# Patient Record
Sex: Female | Born: 1935 | Race: White | Hispanic: No | Marital: Single | Smoking: Former smoker
Health system: Southern US, Community
[De-identification: ages and names within clinical notes are randomized; demographics above are authoritative.]

## PROBLEM LIST (undated history)

## (undated) DIAGNOSIS — I714 Abdominal aortic aneurysm, without rupture: Secondary | ICD-10-CM

## (undated) DIAGNOSIS — C4491 Basal cell carcinoma of skin, unspecified: Secondary | ICD-10-CM

## (undated) DIAGNOSIS — D649 Anemia, unspecified: Secondary | ICD-10-CM

## (undated) DIAGNOSIS — M1990 Unspecified osteoarthritis, unspecified site: Secondary | ICD-10-CM

## (undated) DIAGNOSIS — I7090 Unspecified atherosclerosis: Secondary | ICD-10-CM

## (undated) DIAGNOSIS — J189 Pneumonia, unspecified organism: Secondary | ICD-10-CM

## (undated) DIAGNOSIS — N1330 Unspecified hydronephrosis: Secondary | ICD-10-CM

## (undated) DIAGNOSIS — Z5189 Encounter for other specified aftercare: Secondary | ICD-10-CM

## (undated) DIAGNOSIS — R1310 Dysphagia, unspecified: Secondary | ICD-10-CM

## (undated) DIAGNOSIS — K219 Gastro-esophageal reflux disease without esophagitis: Secondary | ICD-10-CM

## (undated) DIAGNOSIS — N390 Urinary tract infection, site not specified: Secondary | ICD-10-CM

## (undated) DIAGNOSIS — I739 Peripheral vascular disease, unspecified: Secondary | ICD-10-CM

## (undated) DIAGNOSIS — IMO0002 Reserved for concepts with insufficient information to code with codable children: Secondary | ICD-10-CM

## (undated) DIAGNOSIS — I10 Essential (primary) hypertension: Secondary | ICD-10-CM

## (undated) DIAGNOSIS — E7800 Pure hypercholesterolemia, unspecified: Secondary | ICD-10-CM

## (undated) HISTORY — DX: Reserved for concepts with insufficient information to code with codable children: IMO0002

## (undated) HISTORY — PX: ABDOMINAL HYSTERECTOMY: SHX81

## (undated) HISTORY — DX: Gastro-esophageal reflux disease without esophagitis: K21.9

## (undated) HISTORY — DX: Peripheral vascular disease, unspecified: I73.9

## (undated) HISTORY — PX: BLADDER SURGERY: SHX569

## (undated) HISTORY — PX: CHOLECYSTECTOMY: SHX55

## (undated) HISTORY — PX: ARTERY REPAIR: SHX559

## (undated) HISTORY — DX: Unspecified atherosclerosis: I70.90

## (undated) HISTORY — DX: Anemia, unspecified: D64.9

## (undated) HISTORY — DX: Pure hypercholesterolemia, unspecified: E78.00

## (undated) HISTORY — PX: CAROTID ENDARTERECTOMY: SUR193

## (undated) HISTORY — DX: Basal cell carcinoma of skin, unspecified: C44.91

## (undated) HISTORY — DX: Encounter for other specified aftercare: Z51.89

---

## 2003-02-22 ENCOUNTER — Encounter: Admission: RE | Admit: 2003-02-22 | Discharge: 2003-02-22 | Payer: Self-pay | Admitting: Obstetrics and Gynecology

## 2003-02-22 ENCOUNTER — Encounter: Payer: Self-pay | Admitting: Obstetrics and Gynecology

## 2006-11-24 ENCOUNTER — Inpatient Hospital Stay (HOSPITAL_COMMUNITY): Admission: RE | Admit: 2006-11-24 | Discharge: 2006-11-26 | Payer: Self-pay | Admitting: Cardiology

## 2006-12-02 ENCOUNTER — Ambulatory Visit: Payer: Self-pay | Admitting: Vascular Surgery

## 2007-01-25 ENCOUNTER — Ambulatory Visit: Payer: Self-pay | Admitting: Vascular Surgery

## 2007-02-16 ENCOUNTER — Ambulatory Visit: Payer: Self-pay | Admitting: Vascular Surgery

## 2007-02-16 ENCOUNTER — Encounter: Payer: Self-pay | Admitting: Vascular Surgery

## 2007-02-17 ENCOUNTER — Inpatient Hospital Stay (HOSPITAL_COMMUNITY): Admission: AD | Admit: 2007-02-17 | Discharge: 2007-02-18 | Payer: Self-pay | Admitting: Vascular Surgery

## 2007-03-01 ENCOUNTER — Ambulatory Visit: Payer: Self-pay | Admitting: Vascular Surgery

## 2007-03-30 ENCOUNTER — Inpatient Hospital Stay (HOSPITAL_COMMUNITY): Admission: RE | Admit: 2007-03-30 | Discharge: 2007-04-05 | Payer: Self-pay | Admitting: Vascular Surgery

## 2007-03-30 ENCOUNTER — Encounter: Payer: Self-pay | Admitting: Vascular Surgery

## 2007-03-30 ENCOUNTER — Ambulatory Visit: Payer: Self-pay | Admitting: Vascular Surgery

## 2007-04-12 ENCOUNTER — Ambulatory Visit: Payer: Self-pay | Admitting: Vascular Surgery

## 2010-07-20 LAB — CBC
HCT: 18.8 % — ABNORMAL LOW (ref 36.0–46.0)
MCHC: 26.6 g/dL — ABNORMAL LOW (ref 30.0–36.0)
MCV: 61.8 fL — ABNORMAL LOW (ref 78.0–100.0)
Platelets: 219 10*3/uL (ref 150–400)
RDW: 20.8 % — ABNORMAL HIGH (ref 11.5–15.5)
WBC: 8.1 10*3/uL (ref 4.0–10.5)

## 2010-07-20 LAB — DIFFERENTIAL
Basophils Absolute: 0 10*3/uL (ref 0.0–0.1)
Lymphs Abs: 1.4 10*3/uL (ref 0.7–4.0)
Monocytes Absolute: 0.7 10*3/uL (ref 0.1–1.0)
Monocytes Relative: 9 % (ref 3–12)
Neutro Abs: 5.8 10*3/uL (ref 1.7–7.7)

## 2010-07-20 LAB — BASIC METABOLIC PANEL
BUN: 14 mg/dL (ref 6–23)
Calcium: 8.5 mg/dL (ref 8.4–10.5)
GFR calc non Af Amer: >60 mL/min (ref >60)
Glucose, Bld: 117 mg/dL — ABNORMAL HIGH (ref 70–99)
Potassium: 4 mEq/L (ref 3.5–5.1)
Sodium: 136 mEq/L (ref 135–145)

## 2010-07-20 LAB — CK TOTAL AND CKMB (NOT AT ARMC): Relative Index: INVALID (ref 0.0–2.5)

## 2010-07-21 ENCOUNTER — Inpatient Hospital Stay (HOSPITAL_COMMUNITY)
Admission: EM | Admit: 2010-07-21 | Discharge: 2010-07-24 | DRG: 086 | Disposition: A | Discharge status: Home / Self Care | Payer: MEDICARE | Attending: Emergency Medicine | Admitting: Emergency Medicine

## 2010-07-21 DIAGNOSIS — K922 Gastrointestinal hemorrhage, unspecified: Secondary | ICD-10-CM | POA: Diagnosis present

## 2010-07-21 DIAGNOSIS — Y92009 Unspecified place in unspecified non-institutional (private) residence as the place of occurrence of the external cause: Secondary | ICD-10-CM

## 2010-07-21 DIAGNOSIS — I739 Peripheral vascular disease, unspecified: Secondary | ICD-10-CM | POA: Diagnosis present

## 2010-07-21 DIAGNOSIS — Z882 Allergy status to sulfonamides status: Secondary | ICD-10-CM

## 2010-07-21 DIAGNOSIS — K5730 Diverticulosis of large intestine without perforation or abscess without bleeding: Secondary | ICD-10-CM | POA: Diagnosis present

## 2010-07-21 DIAGNOSIS — I10 Essential (primary) hypertension: Secondary | ICD-10-CM | POA: Diagnosis present

## 2010-07-21 DIAGNOSIS — D126 Benign neoplasm of colon, unspecified: Secondary | ICD-10-CM | POA: Diagnosis present

## 2010-07-21 DIAGNOSIS — S065X0A Traumatic subdural hemorrhage without loss of consciousness, initial encounter: Principal | ICD-10-CM | POA: Diagnosis present

## 2010-07-21 DIAGNOSIS — K2970 Gastritis, unspecified, without bleeding: Secondary | ICD-10-CM | POA: Diagnosis present

## 2010-07-21 DIAGNOSIS — D509 Iron deficiency anemia, unspecified: Secondary | ICD-10-CM

## 2010-07-21 DIAGNOSIS — R55 Syncope and collapse: Secondary | ICD-10-CM | POA: Diagnosis present

## 2010-07-21 DIAGNOSIS — Z880 Allergy status to penicillin: Secondary | ICD-10-CM

## 2010-07-21 DIAGNOSIS — D500 Iron deficiency anemia secondary to blood loss (chronic): Secondary | ICD-10-CM | POA: Diagnosis present

## 2010-07-21 DIAGNOSIS — R630 Anorexia: Secondary | ICD-10-CM | POA: Diagnosis present

## 2010-07-21 DIAGNOSIS — Z7982 Long term (current) use of aspirin: Secondary | ICD-10-CM

## 2010-07-21 DIAGNOSIS — K259 Gastric ulcer, unspecified as acute or chronic, without hemorrhage or perforation: Secondary | ICD-10-CM | POA: Diagnosis present

## 2010-07-21 DIAGNOSIS — W19XXXA Unspecified fall, initial encounter: Secondary | ICD-10-CM | POA: Diagnosis present

## 2010-07-21 DIAGNOSIS — K2990 Gastroduodenitis, unspecified, without bleeding: Secondary | ICD-10-CM | POA: Diagnosis present

## 2010-07-21 DIAGNOSIS — E119 Type 2 diabetes mellitus without complications: Secondary | ICD-10-CM | POA: Diagnosis present

## 2010-07-21 LAB — IRON AND TIBC: UIBC: 365 ug/dL

## 2010-07-21 LAB — COMPREHENSIVE METABOLIC PANEL
ALT: 12 U/L (ref 0–35)
BUN: 11 mg/dL (ref 6–23)
Calcium: 8.5 mg/dL (ref 8.4–10.5)
Glucose, Bld: 99 mg/dL (ref 70–99)
Sodium: 142 mEq/L (ref 135–145)
Total Protein: 5.7 g/dL — ABNORMAL LOW (ref 6.0–8.3)

## 2010-07-21 LAB — CARDIAC PANEL(CRET KIN+CKTOT+MB+TROPI)
CK, MB: 1.8 ng/mL (ref 0.3–4.0)
CK, MB: 4 ng/mL (ref 0.3–4.0)
Relative Index: INVALID (ref 0.0–2.5)
Total CK: 45 U/L (ref 7–177)
Total CK: 92 U/L (ref 7–177)
Troponin I: 0.02 ng/mL (ref 0.00–0.06)
Troponin I: 0.14 ng/mL — ABNORMAL HIGH (ref 0.00–0.06)

## 2010-07-21 LAB — FERRITIN: Ferritin: 12 ng/mL (ref 10–291)

## 2010-07-21 LAB — MAGNESIUM: Magnesium: 1.2 mg/dL — ABNORMAL LOW (ref 1.5–2.5)

## 2010-07-21 LAB — CBC
HCT: 24.2 % — ABNORMAL LOW (ref 36.0–46.0)
Hemoglobin: 6.9 g/dL — CL (ref 12.0–15.0)
RBC: 3.64 MIL/uL — ABNORMAL LOW (ref 3.87–5.11)
RDW: 24.2 % — ABNORMAL HIGH (ref 11.5–15.5)
WBC: 5.7 10*3/uL (ref 4.0–10.5)

## 2010-07-21 LAB — TSH: TSH: 1.832 u[IU]/mL (ref 0.350–4.500)

## 2010-07-21 LAB — FOLATE: Folate: >20 ng/mL

## 2010-07-21 LAB — APTT: aPTT: 30 seconds (ref 24–37)

## 2010-07-21 LAB — DIFFERENTIAL
Basophils Absolute: 0 10*3/uL (ref 0.0–0.1)
Lymphocytes Relative: 17 % (ref 12–46)
Lymphs Abs: 1 10*3/uL (ref 0.7–4.0)
Monocytes Relative: 12 % (ref 3–12)
Neutrophils Relative %: 68 % (ref 43–77)

## 2010-07-21 LAB — GLUCOSE, CAPILLARY

## 2010-07-21 LAB — PREPARE RBC (CROSSMATCH)

## 2010-07-21 LAB — MRSA PCR SCREENING: MRSA by PCR: NEGATIVE

## 2010-07-21 NOTE — H&P (Signed)
NAME:  Ariana Shea, [name]:  [id number]  MEDICAL RECORD NO.:  [id number]          PATIENT TYPE:  EMS  LOCATION:  MAJO                         FACILITY:  [facility]  PHYSICIAN:  Talmage Nap, MD  DATE OF BIRTH:  March 29, 1934  DATE OF ADMISSION:  07/20/2010 DATE OF DISCHARGE:                             HISTORY & PHYSICAL   PRIMARY CARE PHYSICIAN:  Dr. Delorse Lek, [facility].  History obtainable from the patient.  CHIEF COMPLAINT:  Fall and headache, 4 days' duration.  HISTORY OF PRESENT ILLNESS:  The patient is a 77 year old Caucasian female with history of bilateral carotid endarterectomy, diabetes mellitus, and hypertension; presenting to the emergency room with headaches and history of fall about 4 days ago, on the advice of her primary care physician.  The patient claimed that 4 days ago, while at home, he suddenly blacked out and fell backwards and hit the back of the head on the floor.  She could not recall the event that occurred prior to falling backwards.  She could not recall whether there was any premonitory symptoms prior to falling.  Post fall, patient observed that she was on the floor for unspecified duration of time and subsequently was awake, alert, and oriented.  She however experienced persistent occipital headache.  She denied any history of chest pain.  She denied any history of shortness of breath.  She denied any PND or orthopnea. She denied any hematochezia or melena.  Today, however, the patient decided to call her primary care physician to report the incident of fall about 4 days ago, and she was asked to come to the emergency room by her primary care physician to be evaluated.  At the time patient was seen by me, her only complaint was headache.  She denied any history of neck stiffness.  The headache was said to be occipital.  She denied any blurry vision, no nausea or vomiting, no fever, no chills, no rigor.  No chest  pain, no shortness of breath.  On presentation, hematologic indices done was found to be 5.0 g; she was subsequently admitted for evaluation of anemia.  She also had a CT of the head done without contrast, which showed a small acute subdural hemorrhage not more than 6 mm in thickness, with no significant mass effect.  I discussed extensively the case with the ED physician, Dr. Ethelda Chick, and he told me that he had already discussed the case with the neurosurgeon who said there was no need for neurosurgical followup on the patient.  The patient was however been admitted for neuro checks and for further evaluation of the anemia.  PAST MEDICAL HISTORY:  Positive for hypertension, diabetes mellitus, and hyperlipidemia.  The patient is not sure of having seizure disorder.  PAST SURGICAL HISTORY:  Bilateral carotid endarterectomy.  MEDICATIONS:  Her preadmission meds include, 1. Metformin 100 mg half a tablet p.o. b.i.d. 2. Aspirin 81 mg p.o. daily. 3. Plavix (clopidogrel) 75 mg one p.o. daily. 4. Lisinopril 40 mg one p.o. daily. 5. Carvedilol 25 mg one p.o. daily. 6. Pravastatin 80 mg one p.o. daily.  ALLERGIES:  She has allergies to PENICILLIN and SULFA.  SOCIAL HISTORY:  Negative for alcohol or tobacco use.  The patient lives alone by herself.  FAMILY HISTORY:  York Spaniel to be positive for hypertension.  REVIEW OF SYSTEMS:  The patient had complained of occipital headaches. She denied any history of neck stiffness.  She denied any nausea or vomiting.  No blurred vision.  No fever, no chills, no rigor.  No chest pain, no shortness of breath.  No abdominal discomfort.  No diarrhea or hematochezia.  No dysuria or hematuria.  No swelling of the lower extremities.  No intolerance to heat or cold.  No neuropsychiatric disorder.  PHYSICAL EXAMINATION:  GENERAL:  Elderly lady, not in any obvious respiratory distress, with suboptimal hydration. VITAL SIGNS:  Presently, blood pressure is  124/49, pulse is 80, respiratory rate is 16, temperature is 98.6. HEENT:  Pallor but pupils are reactive to light and extraocular muscles are intact. NECK:  No jugular venous distention.  No carotid bruit.  No lymphadenopathy. CHEST:  Clear to auscultation. HEART:  Heart sounds are 1 and 2. ABDOMEN:  Soft, nontender.  Liver, spleen tip not palpable.  Bowel sounds are positive. EXTREMITIES:  No pedal edema. NEUROLOGIC:  Neck is supple.  No neurologic lateralizing signs. MUSCULOSKELETAL:  Arthritic changes in the knees and feet. SKIN:  Slightly decreased turgor.  LABORATORY DATA:  Initial hematological indices showed WBC of 8.1, hemoglobin of 5.0, hematocrit of 18.8, MCV of 61.8, and platelet count of 209 and normal differential.  Chemistry showed sodium of 136, potassium of 4.0, chloride of 104 with a bicarb of 22, glucose is 117, BUN is 14, and creatinine is 0.88.  First set of cardiac markers, troponin-I 0.02.  Fecal occult blood test is negative.  CT of the head without contrast shows small acute subdural along the right side of the falx, not more than 6 mm in thickness, no significant mass effect.  ADMISSION IMPRESSION: 1. Fall. 2. Syncope. 3. Anemia (microcytic) although fecal occult blood test is negative. 4. Acute subarachnoid hemorrhage. 5. Diabetes mellitus. 6. Hypertension. 7. Carotid artery disease status post endarterectomy bilaterally.  PLAN:  Admit patient to neuro ICU.  The patient will have neurochecks q.2 h.  She will be on half-normal saline IV, to go at rate of 80 mL an hour.  She will be typed and crossed, transfused with 3 units of packed RBCs and Lasix 40 mg IV after second unit of packed RBC.  Plavix will be on hold.  She will be on Accu-Cheks t.i.d. with regular insulin sliding scale, with a.c. and bedtime, with moderate scale (She also will be given Protonix 40 mg IV q.24 h. for GI prophylaxis and TED stockings for DVT prophylaxis).  I discussed the  case extensively with the ED physician, Dr. Ethelda Chick, who told me that he discussed the case with the neurosurgeon, who said there was no need for neurosurgery followup on the patient.  I  consulted the GI physician, Dr. Arlyce Dice, for evaluation of the microcytic anemia, and he said that he will evaluate the patient in  a.m.  Further labs to be ordered on this patient will include cardiac enzymes q.6 x3.  CBC, CMP, and magnesium will be repeated in the a.m.  The patient will have 2-D echo,EEG as well as carotid duplex done. She will be followed and evaluated on a day-to-day basis.     Talmage Nap, MD     CN/MEDQ  D:  07/20/2010  T:  07/21/2010  Job:  [id number]  Electronically Signed by Talmage Nap  on 07/21/2010 02:49:54 AM

## 2010-07-22 ENCOUNTER — Other Ambulatory Visit: Payer: Self-pay | Admitting: Gastroenterology

## 2010-07-22 DIAGNOSIS — K259 Gastric ulcer, unspecified as acute or chronic, without hemorrhage or perforation: Secondary | ICD-10-CM

## 2010-07-22 DIAGNOSIS — K5730 Diverticulosis of large intestine without perforation or abscess without bleeding: Secondary | ICD-10-CM

## 2010-07-22 DIAGNOSIS — D126 Benign neoplasm of colon, unspecified: Secondary | ICD-10-CM

## 2010-07-22 DIAGNOSIS — D509 Iron deficiency anemia, unspecified: Secondary | ICD-10-CM

## 2010-07-22 LAB — CBC
MCH: 22.4 pg — ABNORMAL LOW (ref 26.0–34.0)
MCV: 71.9 fL — ABNORMAL LOW (ref 78.0–100.0)
Platelets: 221 10*3/uL (ref 150–400)
RDW: 26.4 % — ABNORMAL HIGH (ref 11.5–15.5)

## 2010-07-22 LAB — MAGNESIUM: Magnesium: 1.8 mg/dL (ref 1.5–2.5)

## 2010-07-22 LAB — CROSSMATCH
Antibody Screen: NEGATIVE
Unit division: 0
Unit division: 0

## 2010-07-22 LAB — RENAL FUNCTION PANEL
Albumin: 3.1 g/dL — ABNORMAL LOW (ref 3.5–5.2)
BUN: 6 mg/dL (ref 6–23)
Creatinine, Ser: 0.8 mg/dL (ref 0.4–1.2)
Phosphorus: 2.5 mg/dL (ref 2.3–4.6)

## 2010-07-22 LAB — GLUCOSE, CAPILLARY

## 2010-07-23 DIAGNOSIS — D509 Iron deficiency anemia, unspecified: Secondary | ICD-10-CM

## 2010-07-23 DIAGNOSIS — K273 Acute peptic ulcer, site unspecified, without hemorrhage or perforation: Secondary | ICD-10-CM

## 2010-07-23 LAB — BASIC METABOLIC PANEL
CO2: 22 mEq/L (ref 19–32)
Calcium: 8.7 mg/dL (ref 8.4–10.5)
Chloride: 108 mEq/L (ref 96–112)
Glucose, Bld: 106 mg/dL — ABNORMAL HIGH (ref 70–99)
Sodium: 139 mEq/L (ref 135–145)

## 2010-07-23 LAB — GLUCOSE, CAPILLARY
Glucose-Capillary: 99 mg/dL (ref 70–99)
Glucose-Capillary: 112 mg/dL — ABNORMAL HIGH (ref 70–99)
Glucose-Capillary: 101 mg/dL — ABNORMAL HIGH (ref 70–99)
Glucose-Capillary: 190 mg/dL — ABNORMAL HIGH (ref 70–99)

## 2010-07-23 LAB — CBC
HCT: 32.1 % — ABNORMAL LOW (ref 36.0–46.0)
Hemoglobin: 9.6 g/dL — ABNORMAL LOW (ref 12.0–15.0)
MCH: 21.7 pg — ABNORMAL LOW (ref 26.0–34.0)
MCHC: 29.9 g/dL — ABNORMAL LOW (ref 30.0–36.0)

## 2010-07-24 LAB — GLUCOSE, CAPILLARY: Glucose-Capillary: 140 mg/dL — ABNORMAL HIGH (ref 70–99)

## 2010-07-28 ENCOUNTER — Encounter: Payer: Self-pay | Admitting: Gastroenterology

## 2010-07-28 NOTE — Discharge Summary (Signed)
Ariana Shea, [name]:  [id number]  MEDICAL RECORD NO.:  [id number]           PATIENT TYPE:  I  LOCATION:  5024                         FACILITY:  [facility]  PHYSICIAN:  Lonia Blood, M.D.       DATE OF BIRTH:  01/16/1934  DATE OF ADMISSION:  07/20/2010 DATE OF DISCHARGE:  07/24/2010                              DISCHARGE SUMMARY   PRIMARY CARE PHYSICIAN:  Marjory Lies, MD with [facility].  DISCHARGE DIAGNOSES: 1. Fall with a minor subdural hemorrhage - the patient is off of     aspirin and Plavix. 2. Severe anemia, workup pointing towards a chronic iron deficiency,     most likely due to chronic gastrointestinal blood losses - workup     in progress. 3. Hypertension. 4. Diabetes mellitus type 2. 5. Peripheral vascular disease status post bilateral carotid     endarterectomies. 6. One episode of seizure during a hospitalization in 2008. 7. Status post hysterectomy. 8. Status post cholecystectomy. 9. Hypertension.  DISCHARGE MEDICATIONS: 1. Coreg 25 mg daily. 2. Metformin 500 mg twice a day. 3. Pravastatin 80 mg daily. 4. Nu-Iron 150 mg twice a day. 5. Multivitamin tablet daily.  CONDITION ON DISCHARGE:  Ariana Shea was discharged in fair condition. She was deemed to be safe to discharge home with 24-hour supervision. She will ambulate with a walker only.  She will be set up with home health physical therapy, occupational therapy.  She has a followup visit in 2 weeks with Dr. Melvia Heaps from [facility] who plans to repeat the CBC, also possible doing a capsule endoscopy and repeat colonoscopy.  PROCEDURE DURING THIS ADMISSION: 1. The patient underwent a head CT on July 20, 2010, findings     showing a small acute subdural hemorrhage along the right side of     the falx. 2. July 21, 2010, repeat head CT that showed stable subdural     hematoma adjacent to the right frontal lobe. 3. July 22, 2010, upper  endoscopy findings of ulcer in the antrum,     erosions in the antrum. 4. July 22, 2010, colonoscopy findings of diverticulosis, 15-mm     polyp in the sigmoid colon. Polyp was not removed since the patient     still had Plavix on board.  No signs of bleeding identified.  CONSULTATION THIS ADMISSION:  The patient was seen in consultation by Dr. Melvia Heaps from [facility] gastroenterology.  HISTORY AND PHYSICAL:  Refer to dictated H and P which was done by Dr. Beverly Gust.  Briefly Ariana Shea is a 77 year old woman with history of peripheral vascular disease on aspirin and Plavix at home, sustained a fall on unclear circumstances.  She then had a difficult time getting up and summoning to help.  Eventually when help arrived, she was brought to emergency room where she was found to be markedly anemic with a hemoglobin of 5 and MCV of 61, and she was found to have a small subdural hemorrhage.  The patient was admitted for a workup and observation.  HOSPITAL COURSE: 1. Subdural hemorrhage.  This finding of the  head CT was discussed     with the neurosurgeon on-call on July 20, 2010 by the emergency     room physician, Dr. Ethelda Chick. It was felt that the prudent way is     to monitor the patient in neurological intensive care unit     overnight and repeat a head CT the next day.  By the next day, the     patient developed no neurological deficits and a repeat head CT     showed stable subdural hemorrhage.  We continued to held the     aspirin and Plavix. 2. Severe anemia.  MCV was found to be in the 60s.  A workup will be     an anemia panel indicated iron deficiency by the fact that the     patient's ferritin level in this acute setting was found at 12.  We     felt that most likely source of iron deficiency of gastrointestinal     blood losses.  The patient underwent upper endoscopy and     colonoscopy with findings of a small, no bleeding antral ulcer,     erosions from  gastritis and diverticulosis.  Plan is to continue to     held aspirin and Plavix.  The patient did have transfusion of 3     units packed red blood cells and her discharge hemoglobin is at     9.6.  She will follow up with Dr. Arlyce Dice from Gastroenterology for     repeat CBC, elective colonoscopy and then if is deemed safe, she     can resume the aspirin and Plavix. 3. The patient was evaluated by physical therapist and outpatient     therapies prior to discharge, felt to be safe to ambulate with the     walker and have 24-hour supervision by family.     Lonia Blood, M.D.     SL/MEDQ  D:  07/25/2010  T:  07/25/2010  Job:  [id number]  cc:   Marjory Lies, M.D. Barbette Hair. Arlyce Dice, MD,FACG  Electronically Signed by Lonia Blood M.D. on 07/28/2010 06:43:49 PM

## 2010-07-29 NOTE — Procedures (Signed)
Summary: Colonoscopy  Patient: Ariana Shea Note: All result statuses are Final unless otherwise noted.  Tests: (1) Colonoscopy (COL)   COL Colonoscopy           DONE      [facility]     [address]     [place], [place]  [address]           COLONOSCOPY PROCEDURE REPORT           PATIENT:  Ariana, Shea  MR#:  [id number]     BIRTHDATE:  [birth year], 76 yrs. old  GENDER:  female     ENDOSCOPIST:  Barbette Hair. Arlyce Dice, MD     REF. BY:     PROCEDURE DATE:  07/22/2010     PROCEDURE:  Diagnostic Colonoscopy     ASA CLASS:  Class III     INDICATIONS:  Iron deficiency anemia     MEDICATIONS:   Fentanyl 20 mcg IV, Versed 2 mg IV           DESCRIPTION OF PROCEDURE:   After the risks benefits and     alternatives of the procedure were thoroughly explained, informed     consent was obtained.  No rectal exam performed. The Pentax     Colonoscope [id number] and [id number]) endoscope was     introduced through the anus and advanced to the cecum, which was     identified by both the appendix and ileocecal valve, without     limitations.  The quality of the prep was excellent, using     MoviPrep.  The instrument was then slowly withdrawn as the colon     was fully examined.     <<PROCEDUREIMAGES>>           FINDINGS:  A pedunculated polyp was found in the sigmoid colon. It     was 15 mm in size. It was found 18 cm from the point of entry.     Polyp not removed because pt is on plavix (see image015).     Moderate diverticulosis was found in the sigmoid colon (see     image002 and image003).  This was otherwise a normal examination     of the colon (see image004, image005, image007, image009, and     image017).   Retroflexed views in the rectum revealed no     abnormalities.    The scope was then withdrawn from the patient     and the procedure completed.           COMPLICATIONS:  None     ENDOSCOPIC IMPRESSION:     1) 15 mm pedunculated polyp in the sigmoid colon  2) Moderate diverticulosis in the sigmoid colon     3) Otherwise normal examination           RECOMMENDATIONS:Elective sigmoidoscopy with polypectomy when     plavix can be held     EGD to look for GI bleeding source           REPEAT EXAM:  No           ______________________________     Barbette Hair. Arlyce Dice, MD           CC:  Marjory Lies, MD           n.     Rosalie Doctor:   Barbette Hair. Kaplan at 07/22/2010 05:25 PM           Ariana Shea, [id number]  Note: An  exclamation mark (!) indicates a result that was not dispersed into the flowsheet. Document Creation Date: 07/22/2010 5:25 PM _______________________________________________________________________  (1) Order result status: Final Collection or observation date-time: 07/22/2010 17:20 Requested date-time:  Receipt date-time:  Reported date-time:  Referring Physician:   Ordering Physician: Melvia Heaps [phone]) Specimen Source:  Source: Launa Grill Order Number: [id number] Lab site:

## 2010-07-29 NOTE — Procedures (Signed)
Summary: Upper Endoscopy  Patient: Ariana Shea Note: All result statuses are Final unless otherwise noted.  Tests: (1) Upper Endoscopy (EGD)   EGD Upper Endoscopy       DONE     [facility]     [address]     [place], [place]  [address]           ENDOSCOPY PROCEDURE REPORT           PATIENT:  [name], [name]  MR#:  [id number]     BIRTHDATE:  Oct 07, 1933, 76 yrs. old  GENDER:  female           ENDOSCOPIST:  Barbette Hair. Arlyce Dice, MD     Referred by:           PROCEDURE DATE:  07/22/2010     PROCEDURE:  EGD with biopsy, [address]     ASA CLASS:  Class III     INDICATIONS:  iron deficiency anemia           MEDICATIONS:   There was residual sedation effect present from     prior procedure., glycopyrrolate (Robinal) 0.2 mg IV     TOPICAL ANESTHETIC:           DESCRIPTION OF PROCEDURE:   After the risks benefits and     alternatives of the procedure were thoroughly explained, informed     consent was obtained.  The Pentax Gastroscope [id number] endoscope     was introduced through the mouth and advanced to the third portion     of the duodenum, without limitations.  The instrument was slowly     withdrawn as the mucosa was fully examined.     <<PROCEDUREIMAGES>>           An ulcer was found in the antrum. Superficial clean-based ulcer at     apex of an enlarged fold. Bxs taken (see image005).  Multiple     erosions were found in the antrum (see image004). Multiple     superficial erosions  Otherwise the examination was normal (see     image002, image003, image006, image008, and image010).     Retroflexed views revealed no abnormalities.    The scope was then     withdrawn from the patient and the procedure completed.           COMPLICATIONS:  None           ENDOSCOPIC IMPRESSION:     1) Ulcer in the antrum     2) Erosions, multiple in the antrum     3) Otherwise normal examination     RECOMMENDATIONS:     1) Avoid NSAIDS     2) continue PPI     3) hemmoccult  stools in 2 weeks; if positive will need capsule     study.           Erosive changes not conclusively responsible for iron deficiency     anemia and likely related to NSAID use.           REPEAT EXAM:  No           ______________________________     Barbette Hair. Arlyce Dice, MD           CC:  Marjory Lies, MD           n.     Rosalie Doctor:   Barbette Hair. Joreen Swearingin at 07/22/2010 05:36 PM           Lavell Luster,  [name], [place] [phone]  Note: An exclamation mark (!) indicates a result that was not dispersed into the flowsheet. Document Creation Date: 07/22/2010 5:36 PM _______________________________________________________________________  (1) Order result status: Final Collection or observation date-time: 07/22/2010 17:30 Requested date-time:  Receipt date-time:  Reported date-time:  Referring Physician:   Ordering Physician: Melvia Heaps [phone]) Specimen Source:  Source: Launa Grill Order Number: [id number] Lab site:

## 2010-08-05 ENCOUNTER — Encounter: Payer: Self-pay | Admitting: Gastroenterology

## 2010-08-06 NOTE — Letter (Signed)
Summary: Results Letter  [facility]  [address] [place], [place] [address]   Phone: [phone]  Fax: [phone]        July 28, 2010 MRN: [id number]    Ariana Shea [address] [place], [place]  [address]    Dear Ms. BOISE,   Your biopsies demonstrated inflammatory changes only.   Please follow the recommendations previously discussed.  Should you have any immediate concerns or questions, feel free to contact me at the office.    Sincerely,  Barbette Hair. Arlyce Dice, M.D., [facility]          Sincerely,  Louis Meckel MD  This letter has been electronically signed by your physician.  Appended Document: Results Letter Letter is mailed to the patient's home address

## 2010-08-12 ENCOUNTER — Other Ambulatory Visit: Payer: MEDICARE

## 2010-08-12 ENCOUNTER — Other Ambulatory Visit: Payer: Self-pay | Admitting: Gastroenterology

## 2010-08-12 ENCOUNTER — Ambulatory Visit (INDEPENDENT_AMBULATORY_CARE_PROVIDER_SITE_OTHER): Payer: MEDICARE | Admitting: Gastroenterology

## 2010-08-12 ENCOUNTER — Telehealth: Payer: Self-pay | Admitting: Gastroenterology

## 2010-08-12 ENCOUNTER — Encounter (INDEPENDENT_AMBULATORY_CARE_PROVIDER_SITE_OTHER): Payer: Self-pay | Admitting: *Deleted

## 2010-08-12 ENCOUNTER — Encounter: Payer: Self-pay | Admitting: Gastroenterology

## 2010-08-12 DIAGNOSIS — D126 Benign neoplasm of colon, unspecified: Secondary | ICD-10-CM | POA: Insufficient documentation

## 2010-08-12 DIAGNOSIS — I739 Peripheral vascular disease, unspecified: Secondary | ICD-10-CM | POA: Insufficient documentation

## 2010-08-12 DIAGNOSIS — D509 Iron deficiency anemia, unspecified: Secondary | ICD-10-CM

## 2010-08-12 DIAGNOSIS — E119 Type 2 diabetes mellitus without complications: Secondary | ICD-10-CM | POA: Insufficient documentation

## 2010-08-12 DIAGNOSIS — K2990 Gastroduodenitis, unspecified, without bleeding: Secondary | ICD-10-CM

## 2010-08-12 LAB — CBC WITH DIFFERENTIAL/PLATELET
Basophils Relative: 0.1 % (ref 0.0–3.0)
Eosinophils Relative: 2.1 % (ref 0.0–5.0)
HCT: 32.3 % — ABNORMAL LOW (ref 36.0–46.0)
Hemoglobin: 10.5 g/dL — ABNORMAL LOW (ref 12.0–15.0)
Lymphs Abs: 1.4 10*3/uL (ref 0.7–4.0)
Monocytes Relative: 9 % (ref 3.0–12.0)
Neutro Abs: 3.9 10*3/uL (ref 1.4–7.7)
RBC: 4.21 Mil/uL (ref 3.87–5.11)
RDW: 30.9 % — ABNORMAL HIGH (ref 11.5–14.6)
WBC: 5.9 10*3/uL (ref 4.5–10.5)

## 2010-08-12 NOTE — Letter (Signed)
Summary: CMA Hemoccult Letter  [facility]  [address] [place], [place] [address]   Phone: [phone]  Fax: [phone]         August 05, 2010 MRN: [id number]    LYNZEE LINDQUIST [address] [place], [place]  [address]    Dear Ms. Lavell Luster,     At your hospital visit, Dr.Kaplan requested that you complete the hemoccult cards given to you. Please follow the instructions on the inside cover and return them as soon as possible.If you have misplaced the hemoccult cards, please call me at [phone] and I will mail you new cards. Your health is very important to [place].These tests will help ensure that Dr. Arlyce Dice has all the information at his disposal to make a complete diagnosis for you.  Thank you for your prompt attention to this matter.   Sincerely,    Selinda Michaels RN  Appended Document: CMA Hemoccult Letter Letter is mailed to the patient's home address

## 2010-08-13 ENCOUNTER — Other Ambulatory Visit: Payer: Self-pay | Admitting: Gastroenterology

## 2010-08-13 ENCOUNTER — Other Ambulatory Visit (AMBULATORY_SURGERY_CENTER): Payer: Medicare Other | Admitting: Gastroenterology

## 2010-08-13 ENCOUNTER — Encounter: Payer: Self-pay | Admitting: Gastroenterology

## 2010-08-13 DIAGNOSIS — D126 Benign neoplasm of colon, unspecified: Secondary | ICD-10-CM

## 2010-08-13 DIAGNOSIS — K623 Rectal prolapse: Secondary | ICD-10-CM

## 2010-08-18 ENCOUNTER — Encounter: Payer: Self-pay | Admitting: Gastroenterology

## 2010-08-18 NOTE — Letter (Signed)
Summary: [facility]: Immunoassay Fecal Occult Blood (iFOB) Order [facility]  [address] [place], [place] [address]   Phone: [phone]  Fax: [phone]      [facility]: Immunoassay Fecal Occult Blood (iFOB) Order Form   August 12, 2010 MRN: [id number]   Ariana Shea December 31, 1933   Physicican Name:Robert Kaplan,MD Diagnosis Code:280.9 Anemia     [name] CMA (AAMA)

## 2010-08-18 NOTE — Miscellaneous (Signed)
Summary: Aciphex samples  Clinical Lists Changes  Medications: Added new medication of ACIPHEX 20 MG TBEC (RABEPRAZOLE SODIUM) 1 by mouth once daily

## 2010-08-18 NOTE — Procedures (Addendum)
Summary: Flexible Sigmoidoscopy  Patient: Ariana Shea Note: All result statuses are Final unless otherwise noted.  Tests: (1) Flexible Sigmoidoscopy (FLX)  FLX Flexible Sigmoidoscopy                             DONE     [facility]     [address]     [place], [place]  [address]           FLEXIBLE SIGMOIDOSCOPY PROCEDURE REPORT           PATIENT:  Ariana Shea, Ariana Shea  MR#:  [id number]     BIRTHDATE:  1934-01-27, 76 yrs. old  GENDER:  female           ENDOSCOPIST:  Barbette Hair. Arlyce Dice, MD     Referred by:           PROCEDURE DATE:  08/13/2010     PROCEDURE:  Flexible Sigmoidoscopy with polypectomy     ASA CLASS:  Class III     INDICATIONS:  excision of known colonic polyps           MEDICATIONS:   Fentanyl 37.5 mcg IV, Versed 3 mg IV           DESCRIPTION OF PROCEDURE:   After the risks benefits and     alternatives of the procedure were thoroughly explained, informed     consent was obtained.  Digital rectal exam was performed and     revealed rectal prolapse.   The [id number] endoscope     was introduced through the anus and advanced to the descending     colon, without limitations.  The quality of the prep was .  The     instrument was then slowly withdrawn as the mucosa was fully     examined.     <<PROCEDUREIMAGES>>           polyps in the sigmoid colon. 2 1-1 (see image2 and image3).5cm     pedunculated polyps at 20 and 23cm from anus - removed with hot     polypectomy snare and submitted to pathology   Retroflexed views     in the rectum revealed no abnormalities.    The scope was then     withdrawn from the patient and the procedure terminated.           COMPLICATIONS:  None           ENDOSCOPIC IMPRESSION:     1) Polyps in the sigmoid colon     RECOMMENDATIONS:     1) await biopsy results           REPEAT EXAM:   You will receive a letter from Dr. Arlyce Dice in 1-2     weeks, after reviewing the final pathology, with followup     recommendations.          ______________________________     Barbette Hair Arlyce Dice, MD           CC:  Marjory Lies, MD           n.     Rosalie Doctor:   Barbette Hair. Kaplan at 08/13/2010 04:16 PM           Fortunato Curling, [id number]  Note: An exclamation mark (!) indicates a result that was not dispersed into the flowsheet. Document Creation Date: 08/13/2010 4:16 PM _______________________________________________________________________  (1) Order result status: Final Collection or observation date-time: 08/13/2010 16:07  Requested date-time:  Receipt date-time:  Reported date-time:  Referring Physician:   Ordering Physician: Melvia Heaps [phone]) Specimen Source:  Source: Launa Grill Order Number: [id number] Lab site:   Appended Document: recall colon     Procedures Next Due Date:    Colonoscopy: 07/2020

## 2010-08-18 NOTE — Assessment & Plan Note (Signed)
Summary: post hospital/EGD   History of Present Illness Visit Type: Follow-up Visit Primary GI MD: Melvia Heaps MD [facility] Primary Provider: Marjory Lies, MD Chief Complaint: Patient here for post hospital visit, she states that she is doing much better. She has had several nose bleeds since she went home from the hospital. Patient is not on a PPI. History of Present Illness:   Ariana Shea  has returned for followup of her anemia. She was hospitalized in December, 2011 with a severe microcytic anemia. Workup included upper and lower endoscopy. The former demonstrated clean-based ulcers; a nonbleeding sigmoid polyp was seen on a latter, but not removed because the patient was on Plavix. Patient's has no GI complaints, including pain , melena, or hematochezia. She's not taking NSAIDs. Although she was supposed to take a PPI she's not doing so.   GI Review of Systems      Denies abdominal pain, acid reflux, belching, bloating, chest pain, dysphagia with liquids, dysphagia with solids, heartburn, loss of appetite, nausea, vomiting, vomiting blood, weight loss, and  weight gain.        Denies anal fissure, black tarry stools, change in bowel habit, constipation, diarrhea, diverticulosis, fecal incontinence, heme positive stool, hemorrhoids, irritable bowel syndrome, jaundice, light color stool, liver problems, rectal bleeding, and  rectal pain. Preventive Screening-Counseling & Management  Alcohol-Tobacco     Smoking Status: quit      Drug Use:  no.      Current Medications (verified): 1)  Pravastatin Sodium 80 Mg Tabs (Pravastatin Sodium) .... Take One By Mouth Once Daily 2)  Ferrex 150 150 Mg Caps (Polysaccharide Iron Complex) .... Take One By Mouth Two Times A Day 3)  Carvedilol 25 Mg Tabs (Carvedilol) .... Take One By Mouth Two Times A Day 4)  Senior Multivitamin Plus  Tabs (Multiple Vitamins-Minerals) .... Take One By Mouth Once Daily 5)  Metformin Hcl 1000 Mg Tabs (Metformin Hcl) ....  Take 1/2 Tablet By Mouth Four Times A Day 6)  Colace 100 Mg Caps (Docusate Sodium) .... Take One By Mouth Every Other Night At Bedtime.  Allergies (verified): 1)  ! Penicillin 2)  Sulfa  Past History:  Past Medical History: gastric ulcer colon polyps Diabetes Anemia Hypertension Peripheral vascular diseas hx of seizure  Past Surgical History: Cholecystectomy Hysterectomy polyps of vocal cords removed bladder tac  Family History: Family History of Breast Cancer: sister No FH of Colon Cancer: Family History of Diabetes: sister and brother Family History of Colon Polyps:brother  Social History: Patient is a former smoker.  Alcohol Use - no Daily Caffeine Use 2 cups Illicit Drug Use - no Smoking Status:  quit Drug Use:  no  Review of Systems       The patient complains of anemia, arthritis/joint pain, and fatigue.  The patient denies allergy/sinus, anxiety-new, back pain, blood in urine, breast changes/lumps, change in vision, confusion, cough, coughing up blood, depression-new, fainting, fever, headaches-new, hearing problems, heart murmur, heart rhythm changes, itching, menstrual pain, muscle pains/cramps, night sweats, nosebleeds, pregnancy symptoms, shortness of breath, skin rash, sleeping problems, sore throat, swelling of feet/legs, swollen lymph glands, thirst - excessive, urination - excessive, urination changes/pain, urine leakage, vision changes, and voice change.         All other systems were reviewed and were negative   Vital Signs:  Patient profile:   77 year old female Height:      63 inches Weight:      121.8 pounds BMI:  21.65 Pulse rate:   78 / minute Pulse rhythm:   regular BP sitting:   110 / 58  (left arm) Cuff size:   regular  Vitals Entered By: Harlow Mares CMA Duncan Dull) (August 12, 2010 10:55 AM)   Impression & Recommendations:  Problem # 1:  ANEMIA, IRON DEFICIENCY (ICD-280.9)  Etiology has not been clearly established. It could be  NSAID-related. AVMs are another consideration.  Recommendations #1 IFOB  Hemoccults ; if negative and her blood counts are stable I would not pursue  GI work up any further , assuming that her GI blood loss was related to NSAIDs. If she is Hemoccult positive , then  she will require capsule endoscopy. Orders: TLB-CBC Platelet - w/Differential (85025-CBCD) Flex with Sedation (Flex w/Sed)  Problem # 2:  COLONIC POLYPS (ICD-211.3)  Plan sigmoidoscopy with polypectomy  Patient Instructions: 1)  Copy sent to : Marjory Lies, MD 2)  Your Flex Sigmoidoscopy is scheduled on 08/13/2010 at 3pm 3)  You have been instructed on your diabetic medications  4)  Go to the basement for labs today 5)  Colonoscopy and Flexible Sigmoidoscopy brochure given.  6)  Conscious Sedation brochure given.  7)  The medication list was reviewed and reconciled.  All changed / newly prescribed medications were explained.  A complete medication list was provided to the patient / caregiver.

## 2010-08-18 NOTE — Letter (Signed)
Summary: [facility]  [address] [place], [place] [address]   Phone: [phone]  Fax: [phone]       Ariana Shea    1933/09/30    MRN: [id number]        Procedure Day /Date:THURSDAY 08/13/2010     Arrival Time:2PM     Procedure Time:3PM     Location of Procedure:                    X    [facility]oscopy Center (4th Floor)    PREPARATION FOR FLEXIBLE SIGMOIDOSCOPY WITH MAGNESIUM CITRATE  Prior to the day before your procedure, purchase one 8 oz. bottle of Magnesium Citrate and one Fleet Enema from the laxative section of your drugstore.  _________________________________________________________________________________________________  THE DAY BEFORE YOUR PROCEDURE             DATE: 08/12/2010    DAY:WED  1.   Have a clear liquid dinner the night before your procedure.  2.   Do not drink anything colored red or purple.  Avoid juices with pulp.  No orange juice.              CLEAR LIQUIDS INCLUDE: Water Jello Ice Popsicles Tea (sugar ok, no milk/cream) Powdered fruit flavored drinks Coffee (sugar ok, no milk/cream) Gatorade Juice: apple, white grape, white cranberry  Lemonade Clear bullion, consomm, broth Carbonated beverages (any kind) Strained chicken noodle soup Hard Candy   3.   At 7:00 pm the night before your procedure, drink one bottle of Magnesium Citrate over ice.  4.   Drink at least 3 more glasses of clear liquids before bedtime (preferably juices).  5.   Results are expected usually within 1 to 6 hours after taking the Magnesium Citrate.  ___________________________________________________________________________________________________  THE DAY OF YOUR PROCEDURE            DATE: 08/13/2010 [id number]  1.   Use Fleet Enema one hour prior to coming for procedure.  2.   You may drink clear liquids until 1PM  (2 hours before exam)       MEDICATION INSTRUCTIONS  Unless otherwise instructed, you should  take regular prescription medications with a small sip of water as early as possible the morning of your procedure.  Diabetic patients - see separate instructions.          OTHER INSTRUCTIONS  You will need a responsible adult at least 75 years of age to accompany you and drive you home.   This person must remain in the waiting room during your procedure.  Wear loose fitting clothing that is easily removed.  Leave jewelry and other valuables at home.  However, you may wish to bring a book to read or an iPod/MP3 player to listen to music as you wait for your procedure to start.  Remove all body piercing jewelry and leave at home.  Total time from sign-in until discharge is approximately 2-3 hours.  You should go home directly after your procedure and rest.  You can resume normal activities the day after your procedure.  The day of your procedure you should not:   Drive   Make legal decisions   Operate machinery   Drink alcohol   Return to work  You will receive specific instructions about eating, activities and medications before you leave.   The above instructions have been reviewed and explained to me by   _______________________    I fully understand and can  verbalize these instructions _____________________________ Date _________

## 2010-08-18 NOTE — Progress Notes (Signed)
Summary: ? re hem cards  Phone Note Call from Patient Call back at Home Phone [phone]   Caller: Andrey Campanile daughter in law Call For: Dr Arlyce Dice Reason for Call: Talk to Nurse Summary of Call: Daughter in law has questions regarding hem cards she received today for patient to have done, she states that she was seen in the office today and had labs. Does she need to do the cards too? Initial call taken by: Tawni Levy,  August 12, 2010 3:47 PM  Follow-up for Phone Call        Patient does not need to do hemoccult cards because different tests were ordered today by Dr. Arlyce Dice. Daughter-in-law aware. Follow-up by: Selinda Michaels RN,  August 12, 2010 4:41 PM

## 2010-08-18 NOTE — Letter (Signed)
Summary: Diabetic Instructions  [facility]  [address] [place], [place] [address]   Phone: [phone]  Fax: [phone]    Ariana Shea Sep 20, 1933 MRN: [id number]   X   ORAL DIABETIC MEDICATION INSTRUCTIONS  The day before your procedure:   Take your diabetic pill as you do normally  The day of your procedure:   Do not take your diabetic pill    We will check your blood sugar levels during the admission process and again in Recovery before discharging you home  ________________________________________________________________________  _  _   INSULIN (LONG ACTING) MEDICATION INSTRUCTIONS (Lantus, NPH, 70/30, Humulin, Novolin-N)   The day before your procedure:   Take  your regular evening dose    The day of your procedure:   Do not take your morning dose    _  _   INSULIN (SHORT ACTING) MEDICATION INSTRUCTIONS (Regular, Humulog, Novolog)   The day before your procedure:   Do not take your evening dose   The day of your procedure:   Do not take your morning dose   _  _   INSULIN PUMP MEDICATION INSTRUCTIONS  We will contact the physician managing your diabetic care for written dosage instructions for the day before your procedure and the day of your procedure.  Once we have received the instructions, we will contact you.

## 2010-08-21 ENCOUNTER — Encounter (INDEPENDENT_AMBULATORY_CARE_PROVIDER_SITE_OTHER): Payer: Self-pay | Admitting: *Deleted

## 2010-08-21 ENCOUNTER — Telehealth: Payer: Self-pay | Admitting: Gastroenterology

## 2010-08-21 ENCOUNTER — Other Ambulatory Visit: Payer: Self-pay | Admitting: Gastroenterology

## 2010-08-21 ENCOUNTER — Other Ambulatory Visit: Payer: Medicare Other

## 2010-08-21 DIAGNOSIS — D509 Iron deficiency anemia, unspecified: Secondary | ICD-10-CM

## 2010-08-21 LAB — FECAL OCCULT BLOOD, IMMUNOCHEMICAL: Fecal Occult Bld: NEGATIVE

## 2010-08-27 NOTE — Letter (Signed)
Summary: Patient Notice-Hyperplastic Polyps  [facility]  [address] [place], [place] [address]   Phone: [phone]  Fax: [phone]        August 18, 2010 MRN: [id number]    Ariana Shea [address] [place], [place]  [address]    Dear Ariana Shea,  I am pleased to inform you that the colon polyp(s) removed during your recent colonoscopy was (were) found to be hyperplastic.  These types of polyps are NOT pre-cancerous.  It is therefore my recommendation that you have a repeat colonoscopy examination in 10_ years for routine colorectal cancer screening.  Should you develop new or worsening symptoms of abdominal pain, bowel habit changes or bleeding from the rectum or bowels, please schedule an evaluation with either your primary care physician or with me.  Additional information/recommendations:  __No further action with gastroenterology is needed at this time.      Please follow-up with your primary care physician for your other      healthcare needs. __Please call [phone] to schedule a return visit to review      your situation.  __Please keep your follow-up visit as already scheduled.  _x_Continue treatment plan as outlined the day of your exam.  Please call us if you are having persistent problems or have questions about your condition that have not been fully answered at this time.  Sincerely,  Louis Meckel MD This letter has been electronically signed by your physician.  Appended Document: Patient Notice-Hyperplastic Polyps letter mailed

## 2010-09-01 NOTE — Progress Notes (Signed)
Summary: ? re meds  Phone Note Call from Patient Call back at Home Phone [phone]   Caller: Patient Call For: Dr Arlyce Dice Reason for Call: Talk to Nurse Summary of Call: Patient has questiond regarding meds Initial call taken by: Tawni Levy,  August 21, 2010 3:37 PM  Follow-up for Phone Call        Patient was given samples of Aciphex for her "stomach ulcer." She has 3 pills left and wants to know if she should get an rx for this medication. She states her stomach does not hurt but she did not have any pain prior to taking the Aciphex. Please, advise. Follow-up by: Jesse Fall RN,  August 21, 2010 4:52 PM  Additional Follow-up for Phone Call Additional follow up Details #1::        She has stomach ulcers and needs to complete a 6 week course of aciphex Additional Follow-up by: Louis Meckel MD,  August 24, 2010 10:32 AM    Additional Follow-up for Phone Call Additional follow up Details #2::    Spoke with patient and she is aware of Dr. Marzetta Board recommendations. Prescription called in for patient. Follow-up by: Selinda Michaels RN,  August 24, 2010 12:07 PM  New/Updated Medications: ACIPHEX 20 MG TBEC (RABEPRAZOLE SODIUM) Take 1 by mouth daily Prescriptions: ACIPHEX 20 MG TBEC (RABEPRAZOLE SODIUM) Take 1 by mouth daily  #30 x 1   Entered by:   Selinda Michaels RN   Authorized by:   Louis Meckel MD   Signed by:   Selinda Michaels RN on 08/24/2010   Method used:   [facility] to        [facility]  [place] [address]       [address]       [place], [place]  [address]       Ph: [phone] or [phone]       Fax: [phone]   RxID:   [id number]   Appended Document: ? re meds Patient is calling back stating that she cannot afford the Aciphex. Patient wants to know if there is something cheaper that can be called in for her to take. Dr. Arlyce Dice please advise.  Appended Document: ? re meds omeprazole 20mg  once daily  Appended Document: ? re meds Patient states  Aciphex cost to much for her. Per Dr. Arlyce Dice patient may try Omeprazole 20mg  daily. Patient aware.   Clinical Lists Changes  Medications: Added new medication of OMEPRAZOLE 20 MG CPDR (OMEPRAZOLE) Take 1 by mouth daily - Signed Rx of OMEPRAZOLE 20 MG CPDR (OMEPRAZOLE) Take 1 by mouth daily;  #30 x 1;  Signed;  Entered by: Selinda Michaels RN;  Authorized by: Louis Meckel MD;  Method used: [facility]  [place] [address] [place], [place], [place]  [address], Ph: [phone] or [phone], Fax: [phone]    Prescriptions: OMEPRAZOLE 20 MG CPDR (OMEPRAZOLE) Take 1 by mouth daily  #30 x 1   Entered by:   Selinda Michaels RN   Authorized by:   Louis Meckel MD   Signed by:   Selinda Michaels RN on 08/25/2010   Method used:   [facility]        [facility]  [place] [address]       [address]       [place], [place]  [address]       Ph: [phone] or [phone]  Fax: [phone]   RxID:   [id number]

## 2010-11-03 NOTE — Discharge Summary (Signed)
Ariana Shea, LAWLER NO.:  [id number]   MEDICAL RECORD NO.:  [id number]          PATIENT TYPE:  INP   LOCATION:  2037                         FACILITY:  [facility]   PHYSICIAN:  Janetta Hora. Fields, MD  DATE OF BIRTH:  05/29/1934   DATE OF ADMISSION:  02/16/2007  DATE OF DISCHARGE:  02/18/2007                               DISCHARGE SUMMARY   ADMISSION DIAGNOSIS:  1. Severe bilateral carotid artery stenosis, asymptomatic.  2. Urinary tract infection.   DISCHARGE/SECONDARY DIAGNOSES:  1. Bilateral severe carotid artery stenosis, asymptomatic status post      right carotid endarterectomy.  2. History of bladder suspension.  3. Hysterectomy.  4. Cholecystectomy.  5. History of vocal cord polyps.  6. History of former tobacco use.  7. Postoperative nausea, vomiting, resolved.  8. Urinary tract infection, treated with soap with cultures pending.  9. Allergy to penicillin and sulfa drugs which causes rash.  10.History of mild aortic sclerosis.  11  History of bilateral renal artery stenosis.  1. Hyperlipidemia.  2. Hypertension.   PROCEDURES:  Right carotid endarterectomy with Dacron patch angioplasty  by Dr. Fabienne Bruns on February 16, 2007.   BRIEF HISTORY:  [name] is a 77 year old Caucasian female with known  bilateral high-grade severe carotid stenosis which had been  asymptomatic.  Stenosis was greater on the right than on the left.  She  was previously scheduled for carotid endarterectomy in June 2008,  however, she refused to have the procedure done at that time.  She saw  Dr. Darrick Penna in follow-up in August and now wished to have the procedures  scheduled as she continued to be asymptomatic.   HOSPITAL COURSE:  [name] was electively admitted to hospital on  February 16, 2007, and underwent right carotid endarterectomy.  She was  extubated, neurologically intact, and transferred to step-down unit 3300  for the first 24 hours.  Of note, following surgery  it is noted that her  preoperative labs that she had a positive urinalysis.  This was ordered  to be repeated, and a culture sent.  At the time of dictation, her urine  culture is still pending.  However, a repeat urinalysis was again  positive showing a large amount of leukocytes but negative nitrates.  On  the morning of postoperative day #1, she remained neurologically intact  but had had nausea and vomiting overnight and that morning.  This was  felt most likely secondary to anesthesia.  However, because of this, she  was kept overnight for further monitoring.  She had also been started on  Cipro for her urinary tract infection.  She did report some numbness of  her right foot.  However, that was present prior to surgery.  She denied  dysphagia and was moving all extremities strongly and symmetrically.  She also had a small amount of soft tissue swelling in the right neck,  but denied dysphagia, shortness of breath.  She was felt appropriate and  transferred to telemetry unit 2000, where she remained until discharge.  On postop day #2, she reported her nausea and  vomiting had resolved.  However, she was concerned that the Percocet may have contributed.  Therefore, she was changed to Darvocet.  Vitals showed blood pressure  125/64, heart rate in the 80s and 90s in sinus rhythm.  She was  afebrile.  Oxygen saturation 92% on room air.  She was voiding, although  she did have one episode of stress incontinence when ambulating and did  slip in her urine and fell on her bottom but denied any injury.  She  continued to be neurologically intact, and incision appeared to be  healing well without any signs of infection.  Her postoperative labs  showed a white blood count 7.5, hemoglobin 10, hematocrit 29.1, platelet  count 180, sodium 135, potassium 4.0, BUN 16, creatinine 0.67, platelet  count 152.   Since Ms. Vanliew' nausea and vomiting have resolved, she is felt  appropriate for discharge  home on postop day #2 in stable condition.   DISCHARGE MEDICATIONS:  1. Darvocet N-100 1-2 tablets p.o. q.4 h p.r.n. pain, max of 6 tablets      daily.  2. Cipro 500 mg p.o. b.i.d. x3 more days.  3. Aspirin 325 MG daily.  4. Zocor 80 mg q.h.s.  5. Coreg 6.25 mg p.o. b.i.d.  6. Lisinopril 20 mg p.o. q.h.s.  7. Ibuprofen p.r.n. per home regimen and Tylenol PM p.r.n. q.h.s.      p.r.n. for sleep per home regimen.  8. Should she was instructed to hold Plavix since Dr. Darrick Penna was      planning to do left carotid endarterectomy in the near future.   DISCHARGE INSTRUCTIONS:  She was instructed to continue a heart healthy  diet.  She may shower and clean her incisions daily with soap and water.  She should avoid driving or heavy lifting for the next 2 weeks, increase  her activity slowly, clean her incision gently with soap and water.  Call if she develops fever greater than 101 or redness or drainage from  her incision site.  She will see Dr. Darrick Penna in the [facility] in approximately 2 weeks.  Our office will contact her  regarding specific appointment date and time.  Michiel Sites, P.A.      Janetta Hora. Fields, MD  Electronically Signed    AWZ/MEDQ  D:  02/18/2007  T:  02/19/2007  Job:  [id number]   cc:   [facility]  Cristy Hilts. Jacinto Halim, MD  Marjory Lies, M.D.

## 2010-11-03 NOTE — Assessment & Plan Note (Signed)
OFFICE VISIT   Ariana Shea, Ariana Shea  DOB:  10/07/1933                                       12/02/2006  ZOXWR#:[id number]   Ariana Shea is a 77 year old female with known history of peripheral  arterial occlusive disease.  She has been seen on multiple occasions in  the past by Dr. Jacinto Halim.  She has previously had PTA and stenting of her  left common iliac artery.  She had a recent carotid duplex which showed  high grade stenosis and this is followed up with a carotid angiogram  showed bilateral greater than 80% stenoses.  Right internal carotid  artery had essentially a 99% stenosis with string sign.  There is an 85%  to 95% calcified stenosis of the left internal carotid artery as well.  She had bilateral patent vertebral arteries.  She has never been  symptomatic from her carotid stenosis.  She specifically denies any  symptoms of TIA or stroke. She does have a history of mild aortic  sclerosis.  She also has a history of bilateral renal artery stenosis,  hyperlipidemia and hypertension.  She denies history of diabetes.  She  did state that she had some bright lights visualized in her right eye a  couple of times over the last week but this did not sound like frank  amaurosis.   Her past medical history is as above.  She denies history of coronary  artery disease or diabetes.   PAST SURGICAL HISTORY:  Is remarkable for a bladder suspension,  hysterectomy, cholecystectomy, vocal cord polyps.   SOCIAL HISTORY:  She is a former smoker, quit 1-1/2 years ago. She also  has a history of regular alcohol use.   FAMILY HISTORY:  Is unremarkable for her mother, 3 brothers, father and  sister all with vascular disease at a young age.   REVIEW OF SYSTEMS:  She is 5 feet 3 inches, 137 pounds. From a cardiac  standpoint she has a history of a mild murmur related to her aortic  sclerosis again.  She denies shortness of breath or chest pain or  cardiac arrhythmia.  She has  no history of asthma or wheezing.  She also  denies any GI bleeding.  From a GU standpoint she has had recurrence of  her prolapsed bladder.  She still has some pain in her legs with  ambulating, although she states this has improved since her iliac  stenting.  She also has multiple joint and arthritis pains.   MEDICATIONS:  Include:  1. Plavix 75 mg once daily.  2. Aspirin 81 mg once daily.  3. Zocor 80 mg once daily.  4. Coreg 6.25 mg twice a day.  5. Lisinopril 20 mg once daily.   ALLERGIES:  She is allergic to penicillin and sulfa drugs which cause a  rash.   PHYSICAL EXAMINATION:  Blood pressure 142/61 in the left arm, 126/63 in  the right arm, pulse is 74 and regular.  HEENT:  Is unremarkable.  She  has bilateral harsh carotid bruits.  She has 2+ carotid pulses  bilaterally.  She has 2+ radial pulses.  She has 1+ femoral pulses.  Chest:  Clear to auscultation.  Cardiac:  Regular rate and rhythm with a  2/6 systolic murmur.  Abdomen:  :  Obese, soft, nontender, nondistended,  no masses. Neurologic:  Exam shows  symmetric upper extremity and lower  extremity motor strength which is 5/5.  She has no sensory deficits.  Cranial nerves 3, 4, 5 and 7 were intact.   I reviewed her arteriogram of her carotid arteries and agree that there  is a high-grade stenosis of the right side as well as the left.  The  right appears more ominous than the left.  Both of these lesions appear  amendable to carotid endarterectomy.  I discussed with the patient today  the risks, benefits and possible complications of carotid endarterectomy  as a means of stroke prevention.  I also discussed with her possible  complications of bleeding, infection, cranial nerve injury.  She  understands and agrees to proceed.  We will schedule her for a carotid  endarterectomy within the next week.  She is currently on Plavix.  We  will stop that today but continue her on an adult strength aspirin in  the meanwhile.   We will consider doing her contralateral carotid a few  weeks later after she recovers from the right side carotid  endarterectomy.   Janetta Hora. Fields, MD  Electronically Signed   CEF/MEDQ  D:  12/04/2006  T:  12/05/2006  Job:  [id number]   cc:   Cristy Hilts. Jacinto Halim, MD  Marjory Lies, M.D.

## 2010-11-03 NOTE — Assessment & Plan Note (Signed)
OFFICE VISIT   Ariana Shea, Ariana Shea  DOB:  03-19-1934                                       03/01/2007  ZOXWR#:[id number]   Patient returns for followup today after a right carotid endarterectomy  on August 28th.  She did well postoperatively and had no complications.  She has a known high-grade left internal carotid stenosis as well.   PHYSICAL EXAMINATION:  Blood pressure is 132/75 in the left arm and  164/86 in the right arm.  Pulse is 69 and regular.  HEENT:  Unremarkable.  She has 2+ carotid pulses with a well-healed right neck  incision.  Tongue is midline.  Upper extremity and lower extremity motor  exam is 5/5 and symmetric bilaterally.  Patient is recovering well from  her carotid endarterectomy.  She has had no neurologic deficits  postoperatively.   She still needs to have a left carotid endarterectomy for a high-grade  stenosis performed.  Prior to this, we will have a vocal cord evaluation  done by Dr. Osborn Coho of [facility] to make sure that her  cords have full mobility.  We have tentatively scheduled her left  carotid endarterectomy for March 30, 2007.  She will continue to take  her aspirin.   Janetta Hora. Fields, MD  Electronically Signed   CEF/MEDQ  D:  03/01/2007  T:  03/02/2007  Job:  [id number]   cc:   Cristy Hilts. Jacinto Halim, MD  Marjory Lies, M.D.  Kinnie Scales. Annalee Genta, M.D.

## 2010-11-03 NOTE — Cardiovascular Report (Signed)
[name], [name] NO.:  [id number]   MEDICAL RECORD NO.:  [id number]          PATIENT TYPE:  INP   LOCATION:  2807                         FACILITY:  [facility]   PHYSICIAN:  Cristy Hilts. Jacinto Halim, MD       DATE OF BIRTH:  1934-03-20   DATE OF PROCEDURE:  11/24/2006  DATE OF DISCHARGE:                            CARDIAC CATHETERIZATION   REFERRING PHYSICIAN:  Dr. Marjory Lies.   ASSISTING PHYSICIAN:  Dr. Nanetta Batty.   PROCEDURE PERFORMED:  1. Four vessel cerebral intracranial and extracranial arteriography.  2. Arch aortogram.  3. Abdominal aortogram.  4. Left iliac arteriography.  5. PTA and stenting of the left common iliac artery.  6. Right femoral angiography.   INDICATIONS:  Ariana Shea is a 77 year old female with a history of  hypertension, hyperlipidemia who has been complaining of severe  claudication of both the lower extremities, right more than the left.  Ariana Shea underwent evaluation for peripheral arterial disease including a  carotid duplex evaluation.  Ariana Shea was found to have markedly reduced ABI  bilaterally of 0.46 on the right and 0.66 on the left with suggestion of  complete occlusion of the common iliac artery and probable high-grade  stenosis of the left common iliac artery.  Ariana Shea also had carotid duplex  evaluation and this suggested high-grade stenosis of the left internal  carotid artery, given that Ariana Shea was brought in for cerebral angiography  and also abdominal aortogram.  Given her age as a risk factor,  precautions were taken to a limited contrast use.   ANGIOGRAPHIC DATA:  Arch aortogram.  The arch aortogram revealed bovine  arch.  There is mild calcification of the arch of the aorta.  The left  subclavian artery arose from the common origin from the right innominate  artery.   Right common carotid artery and extracranial follow-through.  The right  common carotid artery and right internal carotid artery were smooth with  mild  calcification.  There was a string sign noted of the internal  carotid artery just after its origin.  This had a 99% stenosis.   The right vertebral artery was widely patent although it was not  selectively cannulated.   Left common carotid artery.  The left common carotid artery and left  internal iliac artery were smooth.  The origin of the left internal  carotid artery was heavily calcified with an 85-90% stenosis.  The  external carotid artery was widely patent.   Left vertebral artery.  Left vertebral artery was widely patent.   Grossly, the intracerebral circulation appeared to be intact.  The  anterior communicating artery was underfilled on the right but was  filled by the left system.  The intracranial vasculature will be  overread by radiology.   Abdominal aortogram.  Abdominal gram revealed severe diffuse disease  with diffuse atherosclerotic changes with severe luminal irregularity.  The right common iliac artery was completely occluded.  The left common  iliac artery showed a high-grade stenosis.   Left iliac arteriogram.  The left iliac arteriogram showed an 80%  calcific stenosis  of the left common iliac artery.  There was 30-40%  stenosis of the left external iliac artery and common femoral artery.   Right iliac arteriogram.  The right external iliac artery was patent.  However, the right common iliac artery was completely occluded.   INTERVENTION DATA:  Successful  PTA and stenting of the left common  iliac artery with implantation of an 8.0 x 27 mm Express stent deployed  at 12 atmospheres of pressure for 90 seconds.  Post stent implantation  angiography revealed excellent results.  There was no pressure gradient  across the stenosis post stent implantation.   There were also collaterals noted from the left internal iliac artery to  the right internal and external iliac artery.  Hopefully, this will also  help in improving the circulation in the right lower  extremity and  improving the symptoms of claudication.   RECOMMENDATIONS:  The patient will nee aggressive risk modification.  We  could potentially consider an attempted right common iliac artery  revascularization percutaneously.  However, to limit the contrast, we  have not attempted this today.  A total of 240 cc of contrast was  utilized for cerebral angiography and also abdominal angiography and  angioplasty of the left common iliac artery.   As far as the internal carotid artery stenosis is concerned, bilaterally  there is high-grade stenosis.  Ariana Shea will benefit from revascularization,  although Ariana Shea is asymptomatic.  I will discuss with the patient regarding  revascularization strategy, whether Ariana Shea would be a candidate for  stenting versus we should proceed with surgical revascularization.   TECHNIQUE OF THE PROCEDURE:  Under the usual sterile precaution, using a  5-French right femoral arterial and a 5-French left femoral arterial  access, a 5-French Simmons-1 diagnostic catheter was advanced into the  arch of the aorta over a Wholey wire.  The cerebral angiography was  performed by selective cannulation of each of the vessels.  The right  vertebral artery was not cannulated, but was well visualized.  Then the  catheter was pulled out of the body in the usual fashion and a pigtail  catheter was utilized to perform arch aortogram in the LAO projection  and also abdominal aortogram.   TECHNIQUE OF INTERVENTION:  Using a 7-French long bright tip sheath, a  6.0 x 40 mm Synergy balloon was utilized and a balloon angioplasty of  the left common iliac artery was performed at 12 atmospheres of pressure  for 45 seconds.  Following this, because of persistence of haziness, an  80.0 x 27 mm Express stent was deployed at 10 atmospheres of pressure  for 90 seconds.  Post stent deployment, careful pullback was performed  without any residual pressure gradient.  Angiography repeated.  The  long sheath was  exchanged to a short sheath and sutured in place and the patient  transferred to recovery in stable condition.  During the procedure, 4000  units of intravenous heparin was administered.  The patient tolerated  the procedure well.  No immediate complication noted.      Cristy Hilts. Jacinto Halim, MD  Electronically Signed     JRG/MEDQ  D:  11/24/2006  T:  11/24/2006  Job:  [id number]   cc:   Marjory Lies, M.D.

## 2010-11-03 NOTE — Procedures (Signed)
EEG NUMBER:  [id number]   HISTORY:  This is a 77 year old with focal seizures status post carotid  endarterectomy.  This patient is having an EEG done to evaluate for  seizure activity.  Current medications include Dilantin.   PROCEDURE:  This is a routine EEG.   TECHNICAL DESCRIPTION:  Throughout this routine EEG there is a posterior  dominant rhythm of 7-8 Hz activity at 30-40 microvolts.  The background  activity is mostly comprised of theta and intermittent delta range  activity at 30-70 microvolts.  At times there does seem to be asymmetry  in the background with more prominent slowing noticed over the right  hemisphere primarily in the frontal central head region.  With photic  stimulation there is a mild symmetric photic driving response noted.  Hyperventilation was not performed throughout this recording.  The  patient does become drowsy; however, does not enter stage 2 sleep.  Throughout this record there is no definitive epileptiform activity  noted.  EKG tracing shows a heart rate of 80-100 beats per minute.   IMPRESSION:  This routine EEG is abnormal secondary to diffuse slowing  and at times intermittent right hemispheric slowing.  The diffuse  slowing is suggestive of a toxic metabolic or primary neuronal disorder.  The intermittent right hemispheric slowing is suggestive of possibly an  underlying structural lesion.  Clinical correlation with neuroimaging is  suggested.  No definitive epileptiform activity is present.      Bevelyn Buckles. Nash Shearer, M.D.  Electronically Signed     FAO:ZHYQ  D:  04/04/2007 12:13:24  T:  04/05/2007 08:19:58  Job #:  [id number]

## 2010-11-03 NOTE — Consult Note (Signed)
NAMEVALLEY, KE                 ACCOUNT NO.:  [id number]   MEDICAL RECORD NO.:  [id number]          PATIENT TYPE:  INP   LOCATION:  2306                         FACILITY:  [facility]   PHYSICIAN:  Casimiro Needle L. Reynolds, M.D.DATE OF BIRTH:  1934-02-07   DATE OF CONSULTATION:  03/30/2007  DATE OF DISCHARGE:                                 CONSULTATION   REQUESTING PHYSICIAN:  Di Kindle. Edilia Bo, M.D.   REASON FOR CONSULTATION:  Possible seizures after carotid  endarterectomy.   HISTORY OF PRESENT ILLNESS:  This is the initial inpatient consultation  evaluation of this 77 year old woman with a past history which includes  hypertension, who was found to have significant bilateral carotid  disease by routine screening procedure.  She also has known  hyperlipidemia and bilateral renal artery stenosis.  She underwent a  right carotid endarterectomy February 06, 2007, and today underwent a left  carotid endarterectomy by Dr. Darrick Penna.  The procedure was uneventful and  she came back to the PACU in good condition.   Her postop course was smooth until approximately 1700, at which time she  developed focal involuntary jerking of the right upper and lower  extremities.  She complained of severe right shoulder pain, and  involuntary movements of the arm and to a lesser extent, the leg.  There  was no involvement of the face.  She remained awake and alert during  this activity.  She was taken for a CT which did not show any acute  finding, and had emergent Doppler which demonstrated good flow through  the surgical site on the left.  She received 2 mg of Ativan  intravenously with some improvement in the activity, although still  persists, especially when she is more wakeful.  Neurologic consultation  was requested for management of this situation.   There is no further history available to me, but Dr. Edilia Bo did speak  with her son today, who says that she has on occasion had twitching on  the  right side of her body before.   PAST MEDICAL HISTORY:  1. Hypertension.  2. Hyperlipidemia.  3. Bilateral carotid stenosis.  4. Renal artery stenosis as above.  5. There is no known history of diabetes.  6. There is also questionable history of the twitching which happened      previously, but the significance of this is not entirely clear at      this time.   FAMILY HISTORY:  Remarkable for stroke and coronary disease.   SOCIAL HISTORY:  She is a former smoker and regularly uses alcohol.  She  seems to be independent in activities of daily living.   REVIEW OF SYSTEMS:  Per the admission note of January 25, 2007.  Further  review of systems is unavailable at this time.   MEDICATIONS PRIOR TO ADMISSION:  She is taking Plavix, aspirin, Zocor,  Coreg and lisinopril.  She had received intravenous labetalol and  Dilaudid today, but nothing for nausea.   PHYSICAL EXAMINATION:  VITAL SIGNS:  Temperature 99, blood pressure  160/90, pulse of 100, respiratory rate 22.  GENERAL  APPEARANCE:  This is an uncomfortable-appearing woman supine in  the hospital.  No evident distress.  HEENT:  Head:  Cranium is normocephalic and atraumatic.  Oropharynx  benign.  NECK:  Supple without right carotid bruit.  She has a bandage placed  over her left carotid.  HEART:  Regular rate and rhythm without murmurs.  NEUROLOGIC:  Mental status:  She is awake and alert.  She is able to  answer questions and follow one and two-step commands.  Speech is a  little bit strained.  Cranial nerves:  Pupils are large but reactive.  Extraocular movements full.  She looks to stimuli on both the left and  the right.  Face and palate move normally.  There is no involuntary  movement of the face.  Motor:  Normal bulk.  This is increased tone on  the right.  She is not able to move the right upper extremity  voluntarily, can wiggle the toes on the left a little bit.  She has  irregular twitching movements of the forearm  and wrist flexion and  shoulder elevation along with some contraction of the right quadriceps  which showed does seem to be involuntary.  Sensory:  She reports intact  sensation in all extremities.  Coordination:  Adequately performed on  the left, cannot perform on the right.  Gaze deferred.  Reflexes are not  obtained on the right, normal on the left.  Toes are downgoing  bilaterally.   LABORATORY DATA:  CT of the head performed emergently postoperatively as  above.   IMPRESSION:  Involuntary right body movements following left carotid  endarterectomy.  It sounds like it is possible she had a history of  right body focal motor seizures and these have, for whatever reason,  picked up dramatically following the operation.  I am not sure there is  any other adequate explanation for what is happening.   PLAN:  Will proceed with a Dilantin load, to be followed by Depakote  and/or Keppra.  Failing that, may proceed to a phenobarbital coma.  If  she develops alteration in mental status on the Ativan, she will need  ventilatory support.  We may get an EEG in the morning depending on her  level of wakefulness.  I will follow with you.  Thank you for the  consultation.      Michael L. Thad Ranger, M.D.  Electronically Signed     MLR/MEDQ  D:  03/30/2007  T:  03/31/2007  Job:  [id number]

## 2010-11-03 NOTE — Discharge Summary (Signed)
Ariana Shea, Ariana Shea                 ACCOUNT NO.:  [id number]   MEDICAL RECORD NO.:  [id number]          PATIENT TYPE:  INP   LOCATION:  3705                         FACILITY:  [facility]   PHYSICIAN:  Cristy Hilts. Jacinto Halim, MD       DATE OF BIRTH:  01/27/1934   DATE OF ADMISSION:  11/24/2006  DATE OF DISCHARGE:  11/26/2006                               DISCHARGE SUMMARY   DISCHARGE DIAGNOSES:  1. Peripheral vascular disease, status post percutaneous coronary      intervention and stenting of the left common iliac artery on November 24, 2006.  2. Aortic stenosis, possible patent foramen ovale.  3. A normal left ventricular ejection fraction.  4. Bilateral and significant renal artery stenosis.  5. Hyperlipidemia.   This is a 77 year old Caucasian female, patient of Dr. Jacinto Halim, who was  seen in our office with complaints of severe right lower extremity  claudication.  Patient underwent arterial ultrasound of the lower  extremities and it revealed severe occlusive disease and patient was  brought to the hospital for peripheral vascular angiography and possible  intervention.   Hospital procedure performed on November 24, 2006.  Dr. Jacinto Halim was the  physician performing it.  Angiography revealed 80% calcific stenosis of  the left common iliac artery and during this time, the aortic angiogram  was performed also that revealed 99% stenosis of the right internal  carotid artery.  Left internal carotid artery had 85-90% stenosis, but  both vertebral arteries were widely patent.  Abdominal aorta was  diffusely atherosclerotic with severe luminal irregularity.  Dr. Jacinto Halim  performed angioplasty and stenting of the left common iliac artery.  Right common iliac artery was completely occluded.   Patient tolerated the procedure well and was transferred to the unit in  stable condition.  As far as the internal carotid artery stenosis is  concerned, she probably will benefit from revascularization, although  she is  asymptomatic.  The decision should be made whether to proceed  with the surgical revascularization versus stenting of the carotid  artery.  Post cath, patient developed ecchymosis and hematoma of the  left groin and pressure was held manually to the left groin.  The groin  was compressed and adequate hemostasis was obtained.  The decision was  made to observe patient in the hospital for 24 more hours to make sure  that her hematoma is not increasing in size and she can walk without any  difficulties.  If patient remains stable, she is to be discharged home  on November 26, 2006.   DISCHARGE MEDICATIONS:  1. Plavix 75 mg daily.  2. Lisinopril 20 mg daily.  3. Aspirin 81 mg daily.  4. Zocor 80 mg daily.  5. Coreg 6.25 mg b.i.d.   DISCHARGE DIET:  Low-fat, low-salt, low-cholesterol diet.   DISCHARGE ACTIVITIES:  No driving.  No lifting greater than 5 pounds for  3 days after discharge and Dr. Jacinto Halim will see patient on June 20 at 3:15  at our office in [place].  Prior to the office visit, patient will  undergo ultrasound of the lower extremities.   Laboratories were within normal limits post cath.  Hemoglobin 10.2,  hematocrit 30.2, white blood cell count 7.8, platelets 187.  Potassium  3.7, sodium 138, chloride 104, CO2 27, glucose 123, BUN 14, creatinine  0.68.      Raymon Mutton, P.A.      Cristy Hilts. Jacinto Halim, MD  Electronically Signed    MK/MEDQ  D:  11/25/2006  T:  11/25/2006  Job:  [id number]

## 2010-11-03 NOTE — Discharge Summary (Signed)
Ariana Shea, CHESLOCK NO.:  [id number]   MEDICAL RECORD NO.:  [id number]          PATIENT TYPE:  INP   LOCATION:  2024                         FACILITY:  [facility]   PHYSICIAN:  Janetta Hora. Fields, MD  DATE OF BIRTH:  08-09-1933   DATE OF ADMISSION:  03/30/2007  DATE OF DISCHARGE:                               DISCHARGE SUMMARY   ADMISSION DIAGNOSIS:  Severe left internal carotid artery stenosis,  asymptomatic.   DISCHARGE/SECONDARY DIAGNOSES:  1. Severe left internal carotid artery stenosis, asymptomatic, status      post left carotid endarterectomy.  2. Postoperative left brain focal seizure, controlled on Dilantin.  3. History of right carotid endarterectomy in August of 2008 by Dr.      Darrick Penna.  4. History of bladder suspension.  5. Hysterectomy.  6. Cholecystectomy.  7. History of vocal cord polyps.  8. Former tobacco use.  9. History of mild aortic sclerosis.  10.Bilateral renal artery stenosis.  11.Hyperlipidemia.  12.Hypertension.  13.Mild right upper extremity weakness following postoperative      seizure.  14.Postoperative hyperglycemia with hemoglobin A1c of 7.0.   PROCEDURES:  1. March 30, 2007:  Left carotid endarterectomy with Dacron patch      angioplasty by Dr. Fabienne Bruns.  2. EEG is scheduled for April 04, 2007, but is pending at the time      of this dictation.  3. March 30, 2007:  Head CT without contrast showing no evidence of      acute stroke or other acute process.   CONSULTS:  Neurology, Dr. Kelli Hope   BRIEF HISTORY:  Ms. Salts is a 77 year old Caucasian female who had  known bilateral carotid artery stenosis, asymptomatic.  She has been  followed by Dr. Darrick Penna who recommended staged carotid endarterectomies.  She underwent her right carotid endarterectomy on February 16, 2007.  She  had an uncomplicated postoperative course.  She was recently seen by Dr.  Darrick Penna March 01, 2007 and felt that she should be  scheduled for a  left carotid endarterectomy for high-grade stenosis.  Prior to  scheduling, however, she had focal cord evaluation by Dr. Annalee Genta of  [facility] to make sure her cords had full mobility.  Unfortunately,  I have no office note from this visit at this time.  Surgery was  tentatively scheduled for March 30, 2007.   HOSPITAL COURSE:  [name] was electively admitted to [facility] on March 30, 2007.  She underwent the previously mentioned  procedure.  In initial postoperative phase, she was felt to be  recovering well.  She was extubated neurologically intact, however  around 4:45 p.m., she developed jerking of her right upper and lower  extremity.  A code stroke was called and she was evaluated by Dr.  Kelli Hope.  She had complained of severe right shoulder pain  beforehand.  There was no witnessed involvement of the face.  She  underwent a CT scan which showed no acute findings and then had an  emergent Doppler which demonstrated good flow through the surgical site  on  the left.  She received 2 mg of Ativan intravenously with some  improvement in her activity although by neurology note movement was  still persisting, particularly when she was more wakeful.  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  Dilantin was given for seizure control.  She ultimately required  reintubation.  With combination of Dilantin and Ativan, she had no  further seizures.  By the afternoon of March 31, 2007, she was felt  appropriate for extubation.  She was arousable prior to extubation and  did appear to demonstrate right-sided weakness.  This did appear  somewhat over time with approximately 4 to 5/5 strength in her right  upper extremity and 5/5 in her left upper extremity.  At  discharge, she  was not showing any significant weakness in her lower extremities.  Neurology has recommended an MRI in the near future, this has yet to be  scheduled.  We will clarify timing of this prior to the patient's  discharge.  They have also recommended an EEG which has been ordered for  the morning of April 04, 2007.  Following extubation, Ms. Kovack did  remain in intensive care unit for the next few days.  She was  transferred to the telemetry unit 2000 on October 12.  She had no  further seizures.  She had been changed to oral Dilantin and was off  Ativan.  At this point, neurology is recommending Dilantin 30 mg at  bedtime for at least the next three months, then considering  discontinuing it depending on her EEG results.  They will plan on seeing  her in about one month from discharge.  She has been instructed not to  drive until clearance from neurology.  At the time of dictation, Ms.  Tsan has remained stable on unit 2000.  She denies dysphagia.  She did  feel that she felt a little unsteady with ambulation and with her mild  right upper extremity weakness, we did write for physical therapy and  occupational therapy consult.  Hopefully this can be done while she is  in the hospital, otherwise if she is ready for discharge, we will  arrange for this to be done via home health.  She has been maintaining  sinus rhythm.  Her vitals show blood pressure of 99/54, heart rate in  the 70s and in sinus rhythm, she has been afebrile and saturations have  been 96% on room air.  Her blood sugars have been well controlled.  Her  pain has been controlled on oral medications.  Her incision appeared to  be healing well without signs of infection.  She is voiding and bowel  function returning although she did require Dulcolax for postoperative  constipation.  At this point, it is felt that once her EEG is completed  that she may be ready for discharged.  Again, will clarify timing of  her  MRI with neurology prior to her discharge.  Her postoperative labs show  a sodium 137, potassium 2.7, chloride 101, CO2 26, blood glucose 156,  BUN 10, creatinine 0.58, calcium 9.0, white blood count 8.3, hemoglobin  11.1, hematocrit 32.3, platelet count 197, hemoglobin A1c of 7.0.   DISCHARGE MEDICATIONS:  1. Lisinopril 20 p.o. q.h.s.  2. Simvastatin 80 mg p.o.  q.h.s.  3. Coreg 6.25 mg p.o. b.i.d.  4. Aspirin 325 mg p.o. q.a.m.  5. Oxycodone 5 mg one to two tablets p.o. q.4 hours p.r.n. pain.  6. Dilantin 300 mg p.o. q.h.s. until followup with Dr. Thad Ranger then      as directed.  Again, it is anticipated that she will need this at      least for three months.   DISCHARGE INSTRUCTIONS:  1. She should continue a heart-healthy diet.  She may shower and clean      her incision gently with soap and water.  She may increase her      activities slowly.  She has to avoid driving until cleared by      neurology, presumably for at least the next six months.  Should      call if she develops fever greater than 101, or redness or drainage      from her incision site, and return to the hospital or contact Dr.      Ferne Coe office if anymore seizure activity is noted.  She will      see Dr. Darrick Penna at the [facility] office in      approximately two weeks; our office will contact her regarding a      specific appointment date and time.  2. She should call and schedule a followup with Dr. Kelli Hope.  3. She should continue routine followup with her family physician and      will need continued monitoring of her hyperglycemia.      Jerold Coombe, P.A.      Janetta Hora. Fields, MD  Electronically Signed    AWZ/MEDQ  D:  04/03/2007  T:  04/03/2007  Job:  [id number]   cc:   Casimiro Needle L. Thad Ranger, M.D.  Cristy Hilts. Jacinto Halim, MD  Marjory Lies, M.D.

## 2010-11-03 NOTE — Consult Note (Signed)
NAMEITZA, MANIACI NO.:  [id number]   MEDICAL RECORD NO.:  [id number]          PATIENT TYPE:  INP   LOCATION:  3705                         FACILITY:  [facility]   PHYSICIAN:  Marin Roberts, MDDATE OF BIRTH:  [birth year]   DATE OF CONSULTATION:  DATE OF DISCHARGE:  11/26/2006                                 CONSULTATION   REASON FOR CONSULTATION:  Intracranial interpretation of cerebral  angiography.   FINDINGS:  The right common carotid artery injection demonstrates  minimal antegrade flow within the A1 segment.  There is some flash  filling of posterior communicating artery to fill the posterior cerebral  artery on the right.  There is no focal stenosis, aneurysm or branch  vessel occlusion within the intracranial vessels.  There is delay of  flow to the internal carotid system relative to the external system,  compatible with high-grade proximal stenosis.   Injection of the left common carotid artery demonstrates cross fill of  the anterior communicating artery to fill both A2 segments.  Again,  there is no significant intracranial stenosis, aneurysm or branch vessel  occlusion.   The left vertebral artery injection was done in the AP plain only.  There is no retrograde filling of the right vertebral artery.  Both  posterior cerebral arteries originated from the basilar tip.  No  definite stenosis, aneurysm or branch vessel occlusion is seen.   IMPRESSION:  No significant intracranial pathology identified from 2-  view projections of the right common carotid artery, 2-view injections  of the left common carotid artery and single AP view injection of the  left vertebral artery.      Marin Roberts, MD  Electronically Signed     CM/MEDQ  D:  11/30/2006  T:  11/30/2006  Job:  [id number]

## 2010-11-03 NOTE — Op Note (Signed)
Ariana Shea, Ariana Shea                 ACCOUNT NO.:  [id number]   MEDICAL RECORD NO.:  [id number]          PATIENT TYPE:  OIB   LOCATION:  3310                         FACILITY:  [facility]   PHYSICIAN:  Janetta Hora. Fields, MD  DATE OF BIRTH:  [birth year]   DATE OF PROCEDURE:  02/16/2007  DATE OF DISCHARGE:                               OPERATIVE REPORT   PROCEDURE:  Right carotid endarterectomy.   PREOPERATIVE DIAGNOSIS:  High-grade asymptomatic right internal carotid  artery stenosis.   POSTOPERATIVE DIAGNOSIS:  High-grade asymptomatic right internal carotid  artery stenosis.   ANESTHESIA:  General.   ASSISTANT:  Shonna Chock, PA-C   OPERATIVE FINDINGS:  1. Greater than 90% stenosis right internal carotid artery.  2. Dacron patch.  3. 10-French shunt.   OPERATIVE DETAILS:  After obtaining informed consent, the patient was  taken to the operating room.  The patient was placed in the supine  position on the operating room table.  After induction of general  anesthesia and endotracheal intubation, a Foley catheter was placed.  Next, the patient's entire right neck and chest were prepped and draped  in the usual sterile fashion.  An oblique incision was made just  anterior to the border of the right sternocleidomastoid muscle.  Incision was carried down through the subcutaneous tissues down to the  platysma.  The platysma was incised.  The dissection was then carried  down along the anterior border of the internal jugular vein.  The  external jugular vein was divided between ties.  The common facial vein  and several tributary veins were ligated and divided between silk ties.  Common carotid artery was dissected free circumferentially.  Vagus nerve  was identified and protected from harm's way.  Umbilical tape was placed  around the common carotid artery.  Next, the distal internal carotid  artery was dissected free circumferentially.  The plaque extended up to  the level of the  hypoglossal nerve.  Therefore, this required division  of the sternocleidomastoid branches tethering the hypoglossal nerve.  These were ligated __________  silk ties.  This allowed mobilization of  the nerve sufficiently to get up above the level of the plaque and thus  a loop was placed around the distal internal carotid artery in this  location.  Next, the distal external carotid artery and the superior  thyroid artery, which was duplicated, both of these were dissected free  circumferentially.  A blue loop was placed around the external carotid  artery and a red loop was placed around the superior thyroid arteries.  The patient's mean arterial was then elevated up to between 90-100 mmHg.  The distal internal carotid artery was occluded with a vessel loop.  The  external superior thyroid arteries were occluded with vessel loops.  Common carotid artery was controlled with peripheral DeBakey clamp.  A  longitudinal arteriotomy was made in the common carotid artery and  extended up through the carotid bifurcation.  A 10-French shunt was then  brought up into the operative field and threaded into the distal  internal carotid artery and allowed to backbleed  thoroughly.  The shunt  was then threaded into the proximal common carotid artery.  It was  inspected for air and then reopened to restore circulation to the brain  after approximately five minutes.  Proximal control of the shunt was  obtained with a Rumel tourniquet.  Next, endarterectomy was begun on a  suitable plane near the carotid bifurcation.  The external carotid  artery was endarterectomized with eversion technique.  A good endpoint  was obtained in the distal internal carotid artery.  All loose debris  was then removed under direct vision.  The artery was thoroughly  irrigated with heparinized saline.  A Dacron patch was then brought up  into the operative field and sewn on as a patch angioplasty using a  running 6-0 Prolene  suture.  Just prior to completion, anastomoses were  backbled and thoroughly flushed.  Anastomosis was secured.  Flow was  first restored to the external carotid artery and then after  approximately five cardiac cycles, flow was restored to the internal  carotid artery.  Two single repair sutures were placed near the distal  end of the patch.  Hemostasis was then obtained.  Doppler was used to  inspect the artery and there was good flow through the internal,  external, and common carotid arteries.  After hemostasis had been  obtained, the platysma was reapproximated using running 3-0 Vicryl  suture.  Skin was closed with a 4-0 Vicryl subcuticular stitch.  Instrument, sponge, and needle count was correct at the end of the case.  The patient was awakened in the operating room and had symmetric upper  extremity and lower extremity motor movement at the end of the case.  The patient was taken to the recovery room in stable condition.      Janetta Hora. Fields, MD  Electronically Signed     CEF/MEDQ  D:  02/16/2007  T:  02/16/2007  Job:  [id number]

## 2010-11-03 NOTE — Assessment & Plan Note (Signed)
OFFICE VISIT   KISSIE, ZIOLKOWSKI  DOB:  07-05-1933                                       12/09/2006  [id number]#:[id number]   I spoke with Ms. Branam on the phone yesterday.  We recently had her  scheduled for staged bilateral carotid endarterectomies.  However,  currently she does not wish to have an operation performed because she  has financial concerns about the cost of the operation.  I discussed  with her talking with the people at [facility] to make some financial  arrangements.  However, she wishes to defer any operation for right now.  I stressed to her the importance of consideration of carotid  endarterectomy for stroke prophylaxis and also informed her of the  possible long term risk of stroke without operation.  I also discussed  with her the signs and symptoms of TIA or amaurosis.  Hopefully she will  return if she has any symptoms.  Otherwise I have told her we can  schedule her carotid endarterectomy at any time she wishes in the  future.   Janetta Hora. Fields, MD  Electronically Signed   CEF/MEDQ  D:  12/09/2006  T:  12/12/2006  Job:  [id number]   cc:   Marjory Lies, M.D.  Cristy Hilts. Jacinto Halim, MD

## 2010-11-03 NOTE — Op Note (Signed)
Ariana Shea, Ariana Shea                 ACCOUNT NO.:  [id number]   MEDICAL RECORD NO.:  [id number]          PATIENT TYPE:  INP   LOCATION:  2550                         FACILITY:  [facility]   PHYSICIAN:  Janetta Hora. Fields, MD  DATE OF BIRTH:  [birth year]   DATE OF PROCEDURE:  03/30/2007  DATE OF DISCHARGE:                               OPERATIVE REPORT   PROCEDURE:  Left carotid endarterectomy.   PREOPERATIVE DIAGNOSIS:  High-grade, greater than 80%, left internal  carotid artery stenosis.   POSTOPERATIVE DIAGNOSIS:  High-grade, greater than 80%, left internal  carotid artery stenosis.   ANESTHESIA:  General.   ASSISTANT:  Jerold Coombe, P.A.-C   OPERATIVE FINDINGS:  1. Dacron patch.  2. A 10 French shunt.  3. Greater than 90% subtotal occlusion left internal carotid artery.   OPERATIVE DETAILS:  After obtaining informed consent, the patient taken  to the operating.  The patient placed supine position on the operating  table.  After induction of general anesthesia and endotracheal  intubation, the patient's entire neck and chest were prepped and draped  in the usual sterile fashion.  An oblique incision was made on the left  side of the neck just anterior to the left sternocleidomastoid muscle.  Incision was carried down through the subcutaneous tissues and platysma.   Dissection was carried along the anterior border of the left  sternocleidomastoid muscle.  Common facial vein was dissected free  circumferentially, and ligated and divided between silk ties.  Common  carotid artery was dissected free circumferentially.  Vagus nerve was  identified and protected from harm's way.  The ansa cervicalis was  divided.  Dissection was carried up to the level of the common carotid  arteries.  The superior thyroid artery was dissected free  circumferentially, and a vessel loop placed around this.  The external  carotid artery was identified, and dissected free circumferentially, and  a  vessel loop placed around this.   Dissection was then carried up to the internal carotid artery.  The soft  segment of internal carotid artery was dissected free circumferentially  just below the level of the hypoglossal nerve.  The nerve was identified  and protected from harm's way.  The patient was then given 7000 units of  intravenous heparin.  The patient's mean arterial pressure was raised  above 90.  The distal internal carotid artery was clamped with a fine  bulldog clamp.  The external was controlled with vessel loop as well as  the superior thyroid artery.  The common carotid artery was then  controlled with a peripheral DeBakey clamp.   A longitudinal arteriotomy was made just below the carotid bifurcation.  This was extended up through the carotid stenosis into the internal  carotid artery with Potts scissors.  The stenosis was heavily calcified  and quite tight.  This was basically a subtotal occlusion.  A good  segment of internal carotid artery was identified at the distal portion  of the arteriotomy.   Next a 10-French shunt was brought up in the operative field, and  threaded into the distal  internal carotid artery, and allowed to  thoroughly back bleed.  This was then controlled with  a vessel loop  distally.  The shunt was then threaded down into the proximal common  carotid artery after releasing the peripheral DeBakey clamp.  The  proximal shunt was then controlled with a Rumel tourniquet.  Shunt was  inspected, and found to be free of air, and flow was restored to the  brain after approximately 4 minutes of ischemia time.   Next, an endarterectomy was begun in a suitable plane in the common  carotid artery.  This was then carried up to level of the external  carotid artery, and an external carotid endarterectomy was performed  using the eversion technique.  A good endpoint was obtained.  Plaque was  then removed from the distal internal carotid artery, and also a  good  endpoint was obtained at this level.  The plaque was removed, and sent  to pathology as a specimen.   Next, all loose debris was removed from the carotid artery.  This was  thoroughly irrigated with heparinized saline.  A Dacron patch was  brought up on the operative field, and sewn on as a patch angioplasty  using a running 6-0 Prolene suture.  Just prior to completion  anastomosis, the shunt was clamped.  Mean arterial pressure was, again,  raised to 90 mmHg.  Distal internal carotid artery was controlled with  fine bulldog clamp.  The shunt was removed from the distal internal  carotid artery, and then from the proximal common carotid artery, and  control was obtained with a peripheral DeBakey clamp.  Everything was  thoroughly flushed forward and back bled.   The remainder the anastomosis for the patch was then completed.  The  external carotid artery was then opened and allowed to back bleed to  deair the patch.  Control was obtained of the internal carotid artery,  and flow was first restored to the external carotid artery, and after  approximately 5 cardiac cycles, flow was restored to the internal  carotid artery.  This, again, took approximately 4 minutes.   Next hemostasis was obtained with thrombin and Gelfoam.  The platysma  muscle was reapproximated using a running 3-0 Vicryl suture.  Skin was  closed with a 4-0 Vicryl subcuticular stitch.  The patient tolerated the  procedure well, and there were no complications.  Instrument, sponge,  and needle counts were correct at the end of the case.  The patient  taken to recovery room in stable condition, and was moving upper  extremities and lower extremities symmetrically, and tongue was midline  at the end of the case.      Janetta Hora. Fields, MD  Electronically Signed     CEF/MEDQ  D:  03/30/2007  T:  03/30/2007  Job:  [id number]   cc:   Cristy Hilts. Jacinto Halim, MD

## 2010-11-03 NOTE — Assessment & Plan Note (Signed)
OFFICE VISIT   Ariana Shea, Ariana Shea  DOB:  1934-01-25                                       04/12/2007  MVHQI#:[id number]   Ariana Shea returns for follow-up today after her left carotid  endarterectomy on 03/30/07. Her postoperative course was complicated by  weakness on the right side and a seizure.  Subsequent CT scan and MRI  showed no evidence of stroke.  However, her neurologic event surely was  temporally related to her carotid endarterectomy.  I suspect this may  have been some type of reperfusion phenomenon.  Since her discharge from  the hospital her leg strength has essentially returned to normal.  However, she still has some decreased sensation in her right foot.  Her  right arm strength has also not fully returned and is 4/5 on exam today.  She is currently getting around with her walker.  She is also going to  physical and occupational therapy.  Blood pressure today was 143/73 in  the left arm, 150/78 in her right arm.  She has a healing incision in  the left neck.  Tongue is midline.  She continues to take aspirin 325 mg  once a day.  She has follow-up scheduled with Dr. Thad Ranger within the  next few weeks as far as her seizures are concerned.  Hopefully her  motor function in the right arm will continue to improve.  We will see  her again in 6 months time for repeat carotid duplex exam.  She will  continue to take her aspirin.   Janetta Hora. Fields, MD  Electronically Signed   CEF/MEDQ  D:  04/13/2007  T:  04/14/2007  Job:  [id number]   cc:   Cristy Hilts. Jacinto Halim, MD  Marjory Lies, M.D.  Michael L. Thad Ranger, M.D.

## 2010-11-03 NOTE — Assessment & Plan Note (Signed)
OFFICE VISIT   Ariana Shea, Ariana Shea  DOB:  [birth year]                                       01/25/2007  JXBJY#:[id number]   HISTORY AND PHYSICAL  For admission on February 06 2007.   CHIEF COMPLAINT:  Bilateral carotid stenosis.   HISTORY OF PRESENT ILLNESS:  The patient is a 77 year old female with  known bilateral high-grade severe carotid stenosis.  These have been  asymptomatic.  Right greater than left.  She was previously schedule for  carotid endarterectomy in June of 2008.  However, she refused to have a  procedure done at that time.  She now wishes to have the procedure  scheduled electively.  She continues to deny any symptoms of TIA or  stroke.  She does have a history of mild aortic sclerosis.  She also has  bilateral renal artery stenosis, hyperlipidemia, and hypertension.  She  denies history of diabetes.   PAST MEDICAL HISTORY:  As above.  She denies history of coronary artery  disease or diabetes.   PAST SURGICAL HISTORY:  She had a bladder suspension, hysterectomy,  cholecystectomy, and vocal cord polyps.   SOCIAL HISTORY:  She is a former smoker.  She quit 1-and-a-half years  ago.  She also has a history of regular alcohol use.   FAMILY HISTORY:  Remarkable for her mother, 3 brothers, father, and  sister all of whom had vascular disease at a young age.   REVIEW OF SYSTEMS:  She is 5 feet 3 inches, 137 pounds.  She has a history of a heart murmur with aortic sclerosis.  She denies  shortness of breath, chest pain, or cardiac arrhythmia.  She has no history of asthma or wheezing.  She denies history of GI bleeding.  She does have some recurrence of her prolapsed bladder.  She has some pain in her legs with ambulating, but states this has  improved since her iliac stents by Dr. Jacinto Halim.  She at bedtime multiple  joint and arthritis pains.   MEDICATIONS:  1. Plavix 75 mg once a day.  2. Aspirin 81 mg once a day.  3. Zocor 80 mg once a  day.  4. Coreg 6.25 mg twice a day.  5. Lisinopril 20 mg once a day.   ALLERGIES:  She is allergic to penicillin and sulfa drugs, both of which  cause a rash.   PHYSICAL EXAM:  Blood pressure 154/72, heart rate is 72 and regular.  HEENT is unremarkable.  She has 2+ carotid pulses with bilateral harsh  bruits.  She has 2+ radial and 1+ femoral pulses bilaterally.  Chest is  clear to auscultation.  Cardiac exam is regular rate and rhythm with a  2/6 systolic murmur.  Abdomen is slightly obese, soft, nontender,  nondistended with no masses.  Neuro exam shows symmetric upper extremity  and lower extremity, and motor strength is 5/5.  She has no sensory  defects.  Cranial nerves III, IV, V, and VII were intact.   I discussed with the patient today the procedure of right carotid  endarterectomy followed by interval left carotid endarterectomy a few  weeks later.  The risks, benefits, and possible complications of the  procedure were explained to the patient explaining stroke of 1 to 2% and  cranial nerve injury of 5%.  She understands and agrees to proceed.  We  will stop her Plavix on August 18 and perform right carotid  endarterectomy on August 28.  She will switch over to a full adult  aspirin on the day that she stops her Plavix.   Janetta Hora. Fields, MD  Electronically Signed   CEF/MEDQ  D:  01/25/2007  T:  01/26/2007  Job:  [id number]   cc:   Cristy Hilts. Jacinto Halim, MD  Marjory Lies, M.D.

## 2010-11-06 NOTE — Discharge Summary (Signed)
NAMECHLOEE, TENA NO.:  [id number]   MEDICAL RECORD NO.:  [id number]          PATIENT TYPE:  INP   LOCATION:  2024                         FACILITY:  [facility]   PHYSICIAN:  Janetta Hora. Fields, MD  DATE OF BIRTH:  [birth year]   DATE OF ADMISSION:  03/30/2007  DATE OF DISCHARGE:  04/05/2007                               DISCHARGE SUMMARY   ADDENDUM   This is an addendum to a previously dictated discharge summary; see Job  No. [id number] for details regarding Wendell Fiebig' hospitalization through  April 03, 2007.   At the time of Ms. Timko' previous discharge summary, we were still  waiting on physical therapy and occupational therapy evaluation, as well  as MRI and EEG results.  As of October 15, these were completed.  MRI  and EEG results were reviewed by Dr. Thad Ranger.  EEG was abnormal  secondary to diffuse slowing suggestive of toxic metabolic or primary  neuronal disorder.  Intermittent right hemisphere slowing suggestive of  possible bleeding, underlying structural lesion.  MRI showed no acute  infarct, however old infarcts (watershed left hemisphere, atrophy,  chronic microvascular white matter disease and sinusitis).  Based on  these findings, Dr. Thad Ranger had no further recommendations other than  to continue Dilantin 300 mg at bedtime and with plans to see him on an  outpatient basis.  In regards to her physical therapy and occupational  therapy, they initially felt that she may even need skilled physical  therapy in acute setting such as skilled nursing facility because of her  generalized weakness, however they evaluated her again prior to  discharge and at that time felt she had made great improvement and it  would be safe to be discharged home with home health therapies.  Subsequently, she felt appropriate for discharge home on April 05, 2007.  She had no further seizure activity and remained in stable  condition.   DISCHARGE MEDICATIONS AND  DISCHARGE INSTRUCTIONS:  As previously  dictated.      Jerold Coombe, P.A.      Janetta Hora. Fields, MD  Electronically Signed    AWZ/MEDQ  D:  04/24/2007  T:  04/24/2007  Job:  [id number]   cc:   Casimiro Needle L. Thad Ranger, M.D.  Cristy Hilts. Jacinto Halim, MD  Marjory Lies, M.D.

## 2011-04-01 LAB — COMPREHENSIVE METABOLIC PANEL
ALT: 17
AST: 16
Albumin: 3.9
Albumin: 4.1
Alkaline Phosphatase: 68
BUN: 22
BUN: 28 — ABNORMAL HIGH
Calcium: 9.5
Chloride: 105
GFR calc Af Amer: >60
Glucose, Bld: 205 — ABNORMAL HIGH
Potassium: 5
Sodium: 138
Total Bilirubin: 0.6
Total Protein: 6.5
Total Protein: 6.6

## 2011-04-01 LAB — CBC
HCT: 28.6 — ABNORMAL LOW
HCT: 32.3 — ABNORMAL LOW
HCT: 35.6 — ABNORMAL LOW
HCT: 36.1
Hemoglobin: 11 — ABNORMAL LOW
Hemoglobin: 11.1 — ABNORMAL LOW
Hemoglobin: 12.2
Hemoglobin: 12.3
MCHC: 34.5
MCV: 87.3
Platelets: 185
Platelets: 197
Platelets: 210
Platelets: 228
RBC: 3.28 — ABNORMAL LOW
RBC: 3.71 — ABNORMAL LOW
RBC: 4.13
RDW: 12.8
WBC: 6.9
WBC: 8.3
WBC: 9.8

## 2011-04-01 LAB — TYPE AND SCREEN: Antibody Screen: NEGATIVE

## 2011-04-01 LAB — BASIC METABOLIC PANEL
BUN: 10
BUN: 17
Calcium: 8.8
GFR calc Af Amer: >60
GFR calc Af Amer: >60
GFR calc non Af Amer: >60
GFR calc non Af Amer: >60
GFR calc non Af Amer: >60
Potassium: 3.7
Potassium: 4
Sodium: 137
Sodium: 137
Sodium: 137

## 2011-04-01 LAB — PROTIME-INR
INR: 0.9
INR: 1
Prothrombin Time: 13.4

## 2011-04-01 LAB — POCT I-STAT 3, ART BLOOD GAS (G3+)
Bicarbonate: 22.9
O2 Saturation: 96
Operator id: 121271
Operator id: 203371
Patient temperature: 99.7
TCO2: 20
TCO2: 24
pCO2 arterial: 32.8 — ABNORMAL LOW
pCO2 arterial: 38.8
pCO2 arterial: 51.3 — ABNORMAL HIGH
pH, Arterial: 7.259 — ABNORMAL LOW
pH, Arterial: 7.34 — ABNORMAL LOW
pH, Arterial: 7.476 — ABNORMAL HIGH
pO2, Arterial: 137 — ABNORMAL HIGH
pO2, Arterial: 85

## 2011-04-01 LAB — URINE MICROSCOPIC-ADD ON

## 2011-04-01 LAB — URINALYSIS, ROUTINE W REFLEX MICROSCOPIC
Bilirubin Urine: NEGATIVE
Hgb urine dipstick: NEGATIVE
Nitrite: NEGATIVE
Specific Gravity, Urine: 1.018
Urobilinogen, UA: 0.2
pH: 6

## 2011-04-01 LAB — HEMOGLOBIN A1C
Hgb A1c MFr Bld: 7 — ABNORMAL HIGH
Mean Plasma Glucose: 172

## 2011-04-01 LAB — APTT: aPTT: 24

## 2011-04-02 LAB — BASIC METABOLIC PANEL
Calcium: 8.5
Creatinine, Ser: 0.67
GFR calc non Af Amer: >60
Glucose, Bld: 152 — ABNORMAL HIGH
Sodium: 135

## 2011-04-02 LAB — CBC
HCT: 37.9
Hemoglobin: 10 — ABNORMAL LOW
Hemoglobin: 12.9
Platelets: 180
RBC: 4.33
RDW: 11.9
RDW: 12.1

## 2011-04-02 LAB — APTT: aPTT: 28

## 2011-04-02 LAB — URINALYSIS, ROUTINE W REFLEX MICROSCOPIC
Bilirubin Urine: NEGATIVE
Bilirubin Urine: NEGATIVE
Ketones, ur: NEGATIVE
Nitrite: NEGATIVE
Nitrite: POSITIVE — AB
Specific Gravity, Urine: 1.008
Urobilinogen, UA: 0.2
pH: 6

## 2011-04-02 LAB — COMPREHENSIVE METABOLIC PANEL
Alkaline Phosphatase: 71
BUN: 25 — ABNORMAL HIGH
CO2: 28
GFR calc non Af Amer: >60
Glucose, Bld: 114 — ABNORMAL HIGH
Potassium: 4.3
Total Bilirubin: 0.5
Total Protein: 7.3

## 2011-04-02 LAB — PROTIME-INR
INR: 1
Prothrombin Time: 13

## 2011-04-02 LAB — URINE MICROSCOPIC-ADD ON

## 2011-04-02 LAB — TYPE AND SCREEN: ABO/RH(D): B POS

## 2011-04-02 LAB — ABO/RH: ABO/RH(D): B POS

## 2011-04-08 LAB — CBC
HCT: 30.2 — ABNORMAL LOW
HCT: 30.8 — ABNORMAL LOW
Hemoglobin: 10.2 — ABNORMAL LOW
Hemoglobin: 10.4 — ABNORMAL LOW
MCV: 87.2
Platelets: 190
RBC: 3.44 — ABNORMAL LOW
RDW: 12.5
WBC: 7.8

## 2011-04-08 LAB — BASIC METABOLIC PANEL
BUN: 12
Calcium: 9
Chloride: 104
GFR calc Af Amer: >60
GFR calc non Af Amer: >60
Glucose, Bld: 142 — ABNORMAL HIGH
Potassium: 3.7
Potassium: 3.9
Sodium: 138
Sodium: 140

## 2012-01-11 ENCOUNTER — Encounter (HOSPITAL_COMMUNITY): Payer: Self-pay | Admitting: Emergency Medicine

## 2012-01-11 ENCOUNTER — Emergency Department (HOSPITAL_COMMUNITY)
Admission: EM | Admit: 2012-01-11 | Discharge: 2012-01-12 | Disposition: A | Discharge status: Home / Self Care | Payer: Medicare PPO | Attending: Emergency Medicine | Admitting: Emergency Medicine

## 2012-01-11 ENCOUNTER — Emergency Department (HOSPITAL_COMMUNITY): Payer: Medicare PPO

## 2012-01-11 DIAGNOSIS — S7000XA Contusion of unspecified hip, initial encounter: Secondary | ICD-10-CM | POA: Insufficient documentation

## 2012-01-11 DIAGNOSIS — M47812 Spondylosis without myelopathy or radiculopathy, cervical region: Secondary | ICD-10-CM | POA: Insufficient documentation

## 2012-01-11 DIAGNOSIS — S0003XA Contusion of scalp, initial encounter: Secondary | ICD-10-CM | POA: Insufficient documentation

## 2012-01-11 DIAGNOSIS — S7001XA Contusion of right hip, initial encounter: Secondary | ICD-10-CM

## 2012-01-11 DIAGNOSIS — S0083XA Contusion of other part of head, initial encounter: Secondary | ICD-10-CM | POA: Insufficient documentation

## 2012-01-11 DIAGNOSIS — R011 Cardiac murmur, unspecified: Secondary | ICD-10-CM | POA: Insufficient documentation

## 2012-01-11 DIAGNOSIS — I10 Essential (primary) hypertension: Secondary | ICD-10-CM | POA: Insufficient documentation

## 2012-01-11 DIAGNOSIS — M25559 Pain in unspecified hip: Secondary | ICD-10-CM | POA: Insufficient documentation

## 2012-01-11 DIAGNOSIS — W19XXXA Unspecified fall, initial encounter: Secondary | ICD-10-CM | POA: Insufficient documentation

## 2012-01-11 DIAGNOSIS — E119 Type 2 diabetes mellitus without complications: Secondary | ICD-10-CM | POA: Insufficient documentation

## 2012-01-11 HISTORY — DX: Essential (primary) hypertension: I10

## 2012-01-11 MED ORDER — ONDANSETRON 4 MG PO TBDP
4.0000 mg | ORAL_TABLET | Freq: Once | ORAL | Status: AC
  Administered 2012-01-11: 4 mg via ORAL
  Filled 2012-01-11: qty 1

## 2012-01-11 MED ORDER — HYDROCODONE-ACETAMINOPHEN 5-325 MG PO TABS
1.0000 | ORAL_TABLET | Freq: Once | ORAL | Status: AC
  Administered 2012-01-11: 1 via ORAL
  Filled 2012-01-11: qty 1

## 2012-01-11 NOTE — ED Notes (Signed)
[place]. Complaining of pain to her right hip and a hematoma to the right side of her head. Pt also fell last Monday and hurt her right ankle. No neuro deficits and pt has full sensation to R foot. No shortening of leg noted.

## 2012-01-11 NOTE — ED Provider Notes (Signed)
History     CSN: [id number]  Arrival date & time 01/11/12  2228   First MD Initiated Contact with Patient 01/11/12 2326      Chief Complaint  Patient presents with  . Fall  . Hip Pain    (Consider location/radiation/quality/duration/timing/severity/associated sxs/prior treatment) Patient is a 78 y.o. female presenting with fall and hip pain. The history is provided by the patient.  Fall  Hip Pain    Past Medical History  Diagnosis Date  . Hypertension   . Diabetes mellitus     Past Surgical History  Procedure Date  . Abdominal hysterectomy   . Cholecystectomy   . Bladder surgery   . Artery repair     corotid artery surgery    No family history on file.  History  Substance Use Topics  . Smoking status: Never Smoker   . Smokeless tobacco: Not on file  . Alcohol Use: No    OB History    Grav Para Term Preterm Abortions TAB SAB Ect Mult Living                  Review of Systems  Allergies  Penicillins and Sulfonamide derivatives  Home Medications   Current Outpatient Rx  Name Route Sig Dispense Refill  . ACETAMINOPHEN 500 MG PO TABS Oral Take 500 mg by mouth 2 (two) times daily.    Marland Kitchen CARVEDILOL PO Oral Take 1 tablet by mouth 2 (two) times daily.    . B-12 PO Oral Take 1 tablet by mouth every morning.    Marland Kitchen POLYSACCHARIDE IRON COMPLEX 150 MG PO CAPS Oral Take 150 mg by mouth 2 (two) times daily.    Marland Kitchen METFORMIN HCL 500 MG PO TABS Oral Take 250 mg by mouth 4 (four) times daily.    Marland Kitchen PRAVASTATIN SODIUM PO Oral Take 1 tablet by mouth every morning.    Marland Kitchen UNKNOWN TO PATIENT Oral Take 1 tablet by mouth daily. NAME UNKNOWN: [facility]LCER TREATMENT      BP 151/80  Pulse 84  Temp 97.8 F (36.6 C) (Oral)  Resp 22  Ht 5\' 3"  (1.6 m)  Wt 115 lb (52.164 kg)  BMI 20.37 kg/m2  SpO2 99%  Physical Exam  Nursing note and vitals reviewed. Constitutional: She is oriented to person, place, and time. She appears well-developed and well-nourished.  Non-toxic appearance.       Patient on long spine board.  HENT:  Head: Normocephalic.  Right Ear: Tympanic membrane and external ear normal.  Left Ear: Tympanic membrane and external ear normal.       Hematoma of the right lateral occipital area.  Eyes: EOM and lids are normal. Pupils are equal, round, and reactive to light.  Neck: Normal range of motion. Neck supple. Carotid bruit is not present.       Cervical collar in place.  Cardiovascular: Normal rate, regular rhythm, intact distal pulses and normal pulses.   Murmur heard. Pulmonary/Chest: Breath sounds normal. No respiratory distress.       No rib or chest area pain.  Abdominal: Soft. Bowel sounds are normal. There is no tenderness. There is no guarding.  Musculoskeletal: Normal range of motion.       Pain with movement of the right hip. No palpable deformity. Distal pulses on the right are 1+. There is bruising at the right lateral malleolus. This is from a previous injury to the right foot and ankle. Good movement and good range of motion of the left hip knee  and ankle.  Lymphadenopathy:       Head (right side): No submandibular adenopathy present.       Head (left side): No submandibular adenopathy present.    She has no cervical adenopathy.  Neurological: She is alert and oriented to person, place, and time. She has normal strength. No cranial nerve deficit or sensory deficit.  Skin: Skin is warm and dry.  Psychiatric: She has a normal mood and affect. Her speech is normal.    ED Course  Procedures (including critical care time)  Labs Reviewed - No data to display No results found.   No diagnosis found.    MDM  I have reviewed nursing notes, vital signs, and all appropriate lab and imaging results for this patient. 11:20 PM patient removed from all spine board by me. The CT head scan is negative for fracture or bleed. The CT scan of the neck reveals multiple levels of degenerative disease. But no fracture or dislocation. The x-ray of the  right hip and pelvis are negative for fracture or dislocation.  Pt ambulated in ED with our problem.   Patient will use Tylenol Extra Strength for soreness. She is to return to the emergency department or see her primary care physician if any changes, problems, or concerns.      Kathie Dike, [place] 01/12/12 [phone]

## 2012-01-12 NOTE — ED Provider Notes (Signed)
78 year old lady who complains of mechanical fall this evening striking the right posterior occiput on the ground. She also complained of pain to the hip. On exam the patient has soft abdomen, appears to be in no distress and is no facial trauma or neck tenderness. She does have a hematoma to the right posterior occiput but has normal mental status, strength, sensation, speech. She is able to the emergency department with minimal difficulty. Imaging reviewed with physician assistant, no signs of intracranial hemorrhage, spinal fracture or hip fracture. Patient appears stable for discharge.  Medical screening examination/treatment/procedure(s) were conducted as a shared visit with non-physician practitioner(s) and myself.  I personally evaluated the patient during the encounter    Vida Roller, MD 01/12/12 [phone]

## 2012-01-12 NOTE — ED Notes (Signed)
Spoke with Ariana Shea, [place] in law about her condition. Pt said it was ok to share info with her.

## 2012-06-05 ENCOUNTER — Other Ambulatory Visit (HOSPITAL_COMMUNITY): Payer: Self-pay | Admitting: Cardiovascular Disease

## 2012-06-05 DIAGNOSIS — I739 Peripheral vascular disease, unspecified: Secondary | ICD-10-CM

## 2012-06-23 ENCOUNTER — Encounter (HOSPITAL_COMMUNITY): Payer: Medicare PPO

## 2012-07-10 ENCOUNTER — Ambulatory Visit (HOSPITAL_COMMUNITY)
Admission: RE | Admit: 2012-07-10 | Discharge: 2012-07-10 | Disposition: A | Discharge status: Home / Self Care | Payer: PRIVATE HEALTH INSURANCE | Source: Ambulatory Visit | Attending: Cardiovascular Disease | Admitting: Cardiovascular Disease

## 2012-07-10 DIAGNOSIS — I739 Peripheral vascular disease, unspecified: Secondary | ICD-10-CM | POA: Insufficient documentation

## 2012-07-10 NOTE — Progress Notes (Signed)
LEA duplex completed. Pete Merten D  

## 2012-11-18 ENCOUNTER — Telehealth: Payer: Self-pay | Admitting: Cardiovascular Disease

## 2012-11-18 NOTE — Telephone Encounter (Signed)
Left msg for mrs. Force to call and set up her f/u

## 2012-11-30 ENCOUNTER — Other Ambulatory Visit (HOSPITAL_COMMUNITY): Payer: Self-pay | Admitting: Cardiovascular Disease

## 2012-11-30 ENCOUNTER — Encounter: Payer: Self-pay | Admitting: Cardiovascular Disease

## 2012-11-30 ENCOUNTER — Ambulatory Visit (INDEPENDENT_AMBULATORY_CARE_PROVIDER_SITE_OTHER): Payer: Medicare Other | Admitting: Cardiovascular Disease

## 2012-11-30 VITALS — BP 132/100 | HR 73 | Ht 63.0 in | Wt 126.7 lb

## 2012-11-30 DIAGNOSIS — I10 Essential (primary) hypertension: Secondary | ICD-10-CM

## 2012-11-30 DIAGNOSIS — I739 Peripheral vascular disease, unspecified: Secondary | ICD-10-CM | POA: Insufficient documentation

## 2012-11-30 DIAGNOSIS — I779 Disorder of arteries and arterioles, unspecified: Secondary | ICD-10-CM

## 2012-11-30 MED ORDER — CILOSTAZOL 50 MG PO TABS: 50.0000 mg | ORAL_TABLET | Freq: Two times a day (BID) | ORAL | Status: DC

## 2012-11-30 NOTE — Patient Instructions (Addendum)
Your physician recommends that you schedule a follow-up appointment in: 6 months PA Your physician recommends that you schedule a follow-up appointment in: 1 year Dr Allyson Sabal Your physician has requested that you have a carotid duplex. This test is an ultrasound of the carotid arteries in your neck. It looks at blood flow through these arteries that supply the brain with blood. Allow one hour for this exam. There are no restrictions or special instructions. Your physician has recommended you make the following change in your medication: Start Pletal 50 mg twice daily

## 2012-11-30 NOTE — Progress Notes (Signed)
11/30/2012 Ariana Shea   02-10-1934  [id number]  Primary Physician Delorse Lek, MD Primary Cardiologist: Runell Gess MD Roseanne Reno   HPI:  The patient is a 79 year old, thin-appearing, divorced Caucasian female, mother of 3, grandmother to 8 grandchildren and 6 step-great-grandchildren who I last saw a year ago. She has a history of hypertension, hyperlipidemia and diabetes, as well as remote tobacco abuse having quit 6 years ago and smoked 55 pack years. She does have a family history of heart disease in a brother who had a massive myocardial infarction. She has had bilateral carotid endarterectomies performed by Dr. Fabienne Bruns in the past after an angiogram done November 24, 2006, revealed high-grade bilateral calcified carotid artery stenosis with a bovine arch. She has a known occluded right common iliac artery stenosis and underwent stenting of her left common iliac with an 8 x 27 mm long Express balloon expandable stent. Carotid Dopplers revealed widely patent endarterectomy sites. She has not had lower extremity Dopplers in quite some time and has complained of progressive right calf claudication which is lifestyle limiting over the last year. She denies chest pain or shortness of breath. Her most recent Dopplers performed 07/10/12 were unchanged with a right ABI 0.53, occluded right common iliac, a left ABI of 0.79 with a patent left common iliac artery stent and with left SFA unchanged from prior studies.      Current Outpatient Prescriptions  Medication Sig Dispense Refill  . acetaminophen (TYLENOL) 500 MG tablet Take 500 mg by mouth 2 (two) times daily.      Marland Kitchen CARVEDILOL PO Take 1 tablet by mouth 2 (two) times daily.      . Cyanocobalamin (B-12 PO) Take 1 tablet by mouth 2 (two) times daily.       . iron polysaccharides (FERREX 150) 150 MG capsule Take 150 mg by mouth 2 (two) times daily.      . metFORMIN (GLUCOPHAGE) 500 MG tablet Take 250 mg by mouth 4 (four)  times daily.      Marland Kitchen omeprazole (PRILOSEC) 20 MG capsule Take 20 mg by mouth daily.      Marland Kitchen PRAVASTATIN SODIUM PO Take 1 tablet by mouth every morning.       No current facility-administered medications for this visit.    Allergies  Allergen Reactions  . Penicillins Rash  . Sulfonamide Derivatives Rash    History   Social History  . Marital Status: Single    Spouse Name: N/A    Number of Children: N/A  . Years of Education: N/A   Occupational History  . Not on file.   Social History Main Topics  . Smoking status: Never Smoker   . Smokeless tobacco: Never Used  . Alcohol Use: No  . Drug Use: No  . Sexually Active: Not on file   Other Topics Concern  . Not on file   Social History Narrative  . No narrative on file     Review of Systems: General: negative for chills, fever, night sweats or weight changes.  Cardiovascular: negative for chest pain, dyspnea on exertion, edema, orthopnea, palpitations, paroxysmal nocturnal dyspnea or shortness of breath Dermatological: negative for rash Respiratory: negative for cough or wheezing Urologic: negative for hematuria Abdominal: negative for nausea, vomiting, diarrhea, bright red blood per rectum, melena, or hematemesis Neurologic: negative for visual changes, syncope, or dizziness All other systems reviewed and are otherwise negative except as noted above.    Blood pressure 132/100, pulse 73, height 5'  3" (1.6 m), weight 126 lb 11.2 oz (57.471 kg).  General appearance: alert and no distress Neck: no adenopathy, no carotid bruit, no JVD, supple, symmetrical, trachea midline and thyroid not enlarged, symmetric, no tenderness/mass/nodules Lungs: clear to auscultation bilaterally Heart: regular rate and rhythm, S1, S2 normal, no murmur, click, rub or gallop Extremities: extremities normal, atraumatic, no cyanosis or edema  EKG NSR at 73 without ST or T wave changes  ASSESSMENT AND PLAN:   Peripheral arterial disease She  continues to complain of claudication in her right calf. Right ABI was 0.53 and left of 0.79 last measured 07/10/12. The suggested her left iliac stent is widely patent. I believe the only revascularization option would be a left to right fem-fem crossover graft which is not willing to pursue. I am going to begin her on Pletal 50 mg by mouth twice a day      Runell Gess MD [facility] 11/30/2012 10:22 AM

## 2012-11-30 NOTE — Assessment & Plan Note (Signed)
She continues to complain of claudication in her right calf. Right ABI was 0.53 and left of 0.79 last measured 07/10/12. The suggested her left iliac stent is widely patent. I believe the only revascularization option would be a left to right fem-fem crossover graft which is not willing to pursue. I am going to begin her on Pletal 50 mg by mouth twice a day

## 2013-01-04 ENCOUNTER — Ambulatory Visit (HOSPITAL_COMMUNITY)
Admission: RE | Admit: 2013-01-04 | Discharge: 2013-01-04 | Disposition: A | Discharge status: Home / Self Care | Payer: PRIVATE HEALTH INSURANCE | Source: Ambulatory Visit | Attending: Cardiovascular Disease | Admitting: Cardiovascular Disease

## 2013-01-04 DIAGNOSIS — I739 Peripheral vascular disease, unspecified: Secondary | ICD-10-CM | POA: Insufficient documentation

## 2013-01-04 DIAGNOSIS — I6529 Occlusion and stenosis of unspecified carotid artery: Secondary | ICD-10-CM

## 2013-01-04 NOTE — Progress Notes (Signed)
Carotid Duplex Completed. Vanderbilt Ranieri, RDMS, RVT  

## 2013-01-15 ENCOUNTER — Encounter: Payer: Self-pay | Admitting: *Deleted

## 2013-01-24 ENCOUNTER — Other Ambulatory Visit: Payer: Self-pay

## 2013-04-26 ENCOUNTER — Other Ambulatory Visit: Payer: Self-pay

## 2013-06-07 ENCOUNTER — Encounter: Payer: Self-pay | Admitting: Physician Assistant

## 2013-06-07 ENCOUNTER — Ambulatory Visit (INDEPENDENT_AMBULATORY_CARE_PROVIDER_SITE_OTHER): Payer: PRIVATE HEALTH INSURANCE | Admitting: Physician Assistant

## 2013-06-07 VITALS — BP 168/100 | HR 71 | Ht 64.0 in | Wt 122.0 lb

## 2013-06-07 DIAGNOSIS — I739 Peripheral vascular disease, unspecified: Secondary | ICD-10-CM

## 2013-06-07 DIAGNOSIS — I779 Disorder of arteries and arterioles, unspecified: Secondary | ICD-10-CM

## 2013-06-07 DIAGNOSIS — E119 Type 2 diabetes mellitus without complications: Secondary | ICD-10-CM

## 2013-06-07 DIAGNOSIS — I10 Essential (primary) hypertension: Secondary | ICD-10-CM

## 2013-06-07 NOTE — Assessment & Plan Note (Signed)
No bruits noted.  Follow up dopplers next year.

## 2013-06-07 NOTE — Progress Notes (Signed)
Date:  06/07/2013   ID:  Rodolph Bong, DOB 1933/07/11, MRN [id number]  PCP:  Delorse Lek, MD  Primary Cardiologist:  Allyson Sabal     History of Present Illness: Ariana Shea is a 79 y.o. female, thin-appearing, divorced Caucasian female, mother of 3, grandmother to 8 grandchildren and 6 step-great-grandchildren who saw Dr. Allyson Sabal in June this year.  She is a Scientist, product/process development.  She has a history of hypertension, hyperlipidemia and diabetes, as well as remote tobacco abuse having quit 6 years ago and smoked 55 pack years. She does have a family history of heart disease in a brother who had a massive myocardial infarction. She has had bilateral carotid endarterectomies performed by Dr. Fabienne Bruns in the past after an angiogram done November 24, 2006, revealed high-grade bilateral calcified carotid artery stenosis with a bovine arch. She has a known occluded right common iliac artery stenosis and underwent stenting of her left common iliac with an 8 x 27 mm long Express balloon expandable stent. Carotid Dopplers revealed widely patent endarterectomy sites. She has not had lower extremity Dopplers in quite some time and has complained of progressive right calf claudication which is lifestyle limiting over the last year.  Her most recent Dopplers performed 07/10/12 were unchanged with a right ABI 0.53, occluded right common iliac, a left ABI of 0.79 with a patent left common iliac artery stent and with left SFA unchanged from prior studies.  She presents today for a six month evaluation.  She reports her legs feel better after being started on Pletal. She does have some cramping every now and then.  She denies nausea, vomiting, fever, chest pain, shortness of breath, orthopnea, dizziness, PND, cough, congestion, abdominal pain, hematochezia, melena, lower extremity edema, claudication.  Wt Readings from Last 3 Encounters:  06/07/13 122 lb (55.339 kg)  11/30/12 126 lb 11.2 oz (57.471 kg)  01/11/12 115 lb  (52.164 kg)     Past Medical History  Diagnosis Date  . Hypertension   . Diabetes mellitus   . 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    Current Outpatient Prescriptions  Medication Sig Dispense Refill  . acetaminophen (TYLENOL) 500 MG tablet Take 500 mg by mouth 2 (two) times daily.      Marland Kitchen CARVEDILOL PO Take 25 mg by mouth 2 (two) times daily.       . cilostazol (PLETAL) 50 MG tablet Take 1 tablet (50 mg total) by mouth 2 (two) times daily.  30 tablet  11  . Cyanocobalamin (B-12 PO) Take 1 tablet by mouth 2 (two) times daily.       . iron polysaccharides (FERREX 150) 150 MG capsule Take 150 mg by mouth 2 (two) times daily.      Marland Kitchen lisinopril (PRINIVIL,ZESTRIL) 20 MG tablet Take 20 mg by mouth daily.      . metFORMIN (GLUCOPHAGE) 1000 MG tablet Take 500 mg by mouth 2 (two) times daily.       Marland Kitchen omeprazole (PRILOSEC) 20 MG capsule Take 20 mg by mouth daily.      Marland Kitchen PRAVASTATIN SODIUM PO Take 80 mg by mouth every morning.        No current facility-administered medications for this visit.    Allergies:    Allergies  Allergen Reactions  . Penicillins Rash  . Sulfonamide Derivatives Rash    Social History:  The patient  reports that she has  never smoked. She has never used smokeless tobacco. She reports that she does not drink alcohol or use illicit drugs.   Family history:  History reviewed. No pertinent family history.  ROS:  Please see the history of present illness.  All other systems reviewed and negative.   PHYSICAL EXAM: VS:  BP 168/100  Pulse 71  Ht 5\' 4"  (1.626 m)  Wt 122 lb (55.339 kg)  BMI 20.93 kg/m2 Well nourished, well developed, in no acute distress HEENT: Pupils are equal round react to light accommodation extraocular movements are intact.  Neck: no JVDNo cervical lymphadenopathy.  No carotid bruit.  Cardiac: Regular rate and rhythm without murmurs rubs or gallops. Lungs:  clear to  auscultation bilaterally, no wheezing, rhonchi or rales Abd: soft, nontender, positive bowel sounds all quadrants, no hepatosplenomegaly Ext: no lower extremity edema.  2+ radial and no palpable pedal pulses but feet are warm.. Skin: warm and dry Neuro:  Grossly normal  EKG:  NSR 71 BPM Septal q waves.  ASSESSMENT AND PLAN:  Problem List Items Addressed This Visit   DM   Relevant Medications      lisinopril (PRINIVIL,ZESTRIL) 20 MG tablet   PVD     Legs feel better since starting Pletal.      Carotid artery disease     No bruits noted.  Follow up dopplers next year.    HTN (hypertension) - Primary     BP is elevated today but she reports it is usually controlled..  The patient will check it once daily for the next five days and if it is consistently > 130/80, will call back for medication titration.    Relevant Orders      EKG 12-Lead

## 2013-06-07 NOTE — Patient Instructions (Signed)
1.   Check blood pressure daily for the next five days.  Vary the time of day you check it.  It should be less than 130/80.  If it is not, we may need to increase one of your medicines.  Just call and talk to a nurse. 2.  Follow up with Dr.Berry in 6 months.

## 2013-06-07 NOTE — Assessment & Plan Note (Signed)
Legs feel better since starting Pletal.

## 2013-06-07 NOTE — Assessment & Plan Note (Signed)
BP is elevated today but she reports it is usually controlled..  The patient will check it once daily for the next five days and if it is consistently > 130/80, will call back for medication titration.

## 2013-06-08 ENCOUNTER — Encounter: Payer: Self-pay | Admitting: Physician Assistant

## 2013-07-09 ENCOUNTER — Telehealth (HOSPITAL_COMMUNITY): Payer: Self-pay | Admitting: *Deleted

## 2013-10-26 ENCOUNTER — Other Ambulatory Visit (HOSPITAL_COMMUNITY): Payer: Self-pay | Admitting: Family Medicine

## 2013-10-26 DIAGNOSIS — R1310 Dysphagia, unspecified: Secondary | ICD-10-CM

## 2013-10-29 ENCOUNTER — Ambulatory Visit (HOSPITAL_COMMUNITY)
Admission: RE | Admit: 2013-10-29 | Discharge: 2013-10-29 | Disposition: A | Discharge status: Home / Self Care | Payer: PRIVATE HEALTH INSURANCE | Source: Ambulatory Visit | Attending: Family Medicine | Admitting: Family Medicine

## 2013-10-29 DIAGNOSIS — R1310 Dysphagia, unspecified: Secondary | ICD-10-CM | POA: Insufficient documentation

## 2013-10-29 NOTE — Procedures (Signed)
Objective Swallowing Evaluation: Modified Barium Swallowing Study  Patient Details  Name: Ariana Shea MRN: [id number] Date of Birth: 05/03/1934  Today's Date: 10/29/2013 Time: 1220-1235 SLP Time Calculation (min): 15 min  Past Medical History:  Past Medical History  Diagnosis Date  . Hypertension   . Diabetes mellitus   . 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   Past Surgical History:  Past Surgical History  Procedure Laterality Date  . Abdominal hysterectomy    . Cholecystectomy    . Bladder surgery    . Artery repair      corotid artery surgery   HPI:  80 year old female with PMH of HTN, DM, peripheral arterial disease, and patient reported bilateral CEA 1 year ago, seen for OP MBS due to c/o globus with solid pos.      Assessment / Plan / Recommendation Clinical Impression  Dysphagia Diagnosis: Within Functional Limits Clinical impression: Patient presents with a functional oropharyngeal swallow. Intermittent flash penetration of thin liquids noted which is appropriate for patient's age. Otherwise, patient with full airway protection and pharyngeal clearance of bolus. Although patient does not endorse other symptoms of GER, ? if globus is due to some type of eosphageal dysfunction. Will defer further w/u to MD if feels appropriate.     Treatment Recommendation  No treatment recommended at this time    Diet Recommendation Regular;Thin liquid   Liquid Administration via: Cup;Straw Medication Administration: Whole meds with liquid Supervision: Patient able to self feed Compensations: Small sips/bites;Slow rate Postural Changes and/or Swallow Maneuvers: Seated upright 90 degrees    Other  Recommendations Oral Care Recommendations: Oral care BID   Follow Up Recommendations  None               General HPI: 80 year old female with PMH of HTN, DM, peripheral arterial disease, and patient  reported bilateral CEA 1 year ago, seen for OP MBS due to c/o globus with solid pos.  Type of Study: Modified Barium Swallowing Study Reason for Referral: Objectively evaluate swallowing function Previous Swallow Assessment: none reported Diet Prior to this Study: Regular;Thin liquids Temperature Spikes Noted: No Respiratory Status: Room air History of Recent Intubation: No Behavior/Cognition: Alert;Cooperative;Pleasant mood Oral Cavity - Dentition: Poor condition;Missing dentition Oral Motor / Sensory Function: Within functional limits Self-Feeding Abilities: Able to feed self Patient Positioning: Upright in chair Baseline Vocal Quality: Hoarse (per patient, began prior to CEA) Volitional Cough: Strong Volitional Swallow: Able to elicit Anatomy:  (? osteophytes at C5-6, 6-7, MD not present to confirm) Pharyngeal Secretions: Not observed secondary MBS    Reason for Referral Objectively evaluate swallowing function   Oral Phase Oral Preparation/Oral Phase Oral Phase: WFL   Pharyngeal Phase Pharyngeal Phase Pharyngeal Phase: Within functional limits  Cervical Esophageal Phase    GO    Cervical Esophageal Phase Cervical Esophageal Phase: [facility]    Functional Assessment Tool Used: skilled clinical judgement Functional Limitations: Swallowing Swallow Current Status (P5465): At least 1 percent but less than 20 percent impaired, limited or restricted Swallow Goal Status [phone]): At least 1 percent but less than 20 percent impaired, limited or restricted Swallow Discharge Status [phone]): At least 1 percent but less than 20 percent impaired, limited or restricted   [facility] MA, CCC-SLP [phone]  Ariana Shea Ariana Shea 10/29/2013, 1:21 PM

## 2013-10-30 ENCOUNTER — Other Ambulatory Visit (HOSPITAL_COMMUNITY): Payer: Self-pay | Admitting: Family Medicine

## 2013-11-09 ENCOUNTER — Other Ambulatory Visit: Payer: Self-pay

## 2013-11-09 MED ORDER — CILOSTAZOL 50 MG PO TABS: 50.0000 mg | ORAL_TABLET | Freq: Two times a day (BID) | ORAL | Status: DC

## 2013-11-09 NOTE — Telephone Encounter (Signed)
Rx was sent to pharmacy electronically. 

## 2013-11-23 ENCOUNTER — Telehealth: Payer: Self-pay | Admitting: *Deleted

## 2013-11-23 ENCOUNTER — Encounter: Payer: Self-pay | Admitting: Gastroenterology

## 2013-11-23 ENCOUNTER — Ambulatory Visit (INDEPENDENT_AMBULATORY_CARE_PROVIDER_SITE_OTHER): Payer: PRIVATE HEALTH INSURANCE | Admitting: Gastroenterology

## 2013-11-23 VITALS — BP 140/70 | HR 76 | Ht 63.0 in | Wt 119.6 lb

## 2013-11-23 DIAGNOSIS — D126 Benign neoplasm of colon, unspecified: Secondary | ICD-10-CM

## 2013-11-23 DIAGNOSIS — D509 Iron deficiency anemia, unspecified: Secondary | ICD-10-CM

## 2013-11-23 DIAGNOSIS — R1310 Dysphagia, unspecified: Secondary | ICD-10-CM | POA: Insufficient documentation

## 2013-11-23 NOTE — Assessment & Plan Note (Signed)
No routine followup colonoscopy due to age

## 2013-11-23 NOTE — Progress Notes (Signed)
_                                                                                                                History of Present Illness: 80 year old white female referred for evaluation of dysphagia.  She has been complaining of dysphagia to solids.  She denies pyrosis but has had mild hoarseness.  She is on omeprazole.  In 2012 she underwent upper endoscopy and colonoscopy for workup of an iron deficiency anemia.  The former demonstrated an antral ulcer and gastric erosions which was felt the source of her anemia.  Colon polyps were removed which were determined to be hyperplastic.  She is without odynophagia or abdominal pain.  Recent swallowing evaluation was unremarkable.    Past Medical History  Diagnosis Date  . Hypertension   . Diabetes mellitus   . 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  . Anemia   . Pure hypercholesterolemia   . GERD (gastroesophageal reflux disease)   . Generalized and unspecified atherosclerosis    Past Surgical History  Procedure Laterality Date  . Abdominal hysterectomy    . Cholecystectomy    . Bladder surgery    . Artery repair      corotid artery surgery   family history includes Heart disease in her father. Current Outpatient Prescriptions  Medication Sig Dispense Refill  . acetaminophen (TYLENOL) 500 MG tablet Take 500 mg by mouth 2 (two) times daily.      Marland Kitchen CARVEDILOL PO Take 25 mg by mouth 2 (two) times daily.       . cilostazol (PLETAL) 50 MG tablet Take 1 tablet (50 mg total) by mouth 2 (two) times daily.  180 tablet  2  . Cyanocobalamin (B-12 PO) Take 1 tablet by mouth 2 (two) times daily.       Marland Kitchen lisinopril (PRINIVIL,ZESTRIL) 20 MG tablet Take 20 mg by mouth daily.      . metFORMIN (GLUCOPHAGE) 1000 MG tablet Take 500 mg by mouth 2 (two) times daily.       Marland Kitchen omeprazole (PRILOSEC) 20 MG capsule Take 20 mg by mouth daily.      Marland Kitchen PRAVASTATIN  SODIUM PO Take 80 mg by mouth every morning.        No current facility-administered medications for this visit.   Allergies as of 11/23/2013 - Review Complete 11/23/2013  Allergen Reaction Noted  . Penicillins Rash   . Sulfonamide derivatives Rash     reports that she has never smoked. She has never used smokeless tobacco. She reports that she does not drink alcohol or use illicit drugs.     Review of Systems: Pertinent positive and negative review of systems were noted in the above HPI section. All other review of systems were otherwise negative.  Vital signs were reviewed in today's medical record Physical Exam: General: Well developed , well nourished, no acute distress Skin: anicteric Head: Normocephalic and atraumatic Eyes:  sclerae  anicteric, EOMI Ears: Normal auditory acuity Mouth: No deformity or lesions Neck: Supple, no masses or thyromegaly Lungs: Clear throughout to auscultation Heart: Regular rate and rhythm; no murmurs, rubs or bruits Abdomen: Soft, non tender and non distended. No masses, hepatosplenomegaly or hernias noted. Normal Bowel sounds Rectal:deferred Musculoskeletal: Symmetrical with no gross deformities  Skin: No lesions on visible extremities Pulses:  Normal pulses noted Extremities: No clubbing, cyanosis, edema or deformities noted Neurological: Alert oriented x 4, grossly nonfocal Cervical Nodes:  No significant cervical adenopathy Inguinal Nodes: No significant inguinal adenopathy Psychological:  Alert and cooperative. Normal mood and affect  See Assessment and Plan under Problem List

## 2013-11-23 NOTE — Telephone Encounter (Signed)
11/23/2013   RE: TONJA JEZEWSKI DOB: April 08, 1934 MRN: [id number]   Dear Jerilynn Birkenhead,    We have scheduled the above patient for an endoscopic procedure. Our records show that she is on anticoagulation therapy.   Please advise as to how long the patient may come off her therapy of Pletal prior to the procedure, which is scheduled for 01/08/2014.  Please fax back/ or route the completed form to [place] at [phone].   Sincerely,    Oda Kilts

## 2013-11-23 NOTE — Assessment & Plan Note (Signed)
Prior workup in 2012.  Etiology what was felt to be do to a gastric ulcer with gastric erosions.

## 2013-11-23 NOTE — Assessment & Plan Note (Signed)
Rule out esophageal stricture versus motility disorder.  Recommendations #1 endoscopy and dilation as indicated

## 2013-11-23 NOTE — Patient Instructions (Signed)
You have been scheduled for an endoscopy with propofol. Please follow written instructions given to you at your visit today. If you use inhalers (even only as needed), please bring them with you on the day of your procedure. Your physician has requested that you go to www.startemmi.com and enter the access code given to you at your visit today. This web site gives a general overview about your procedure. However, you should still follow specific instructions given to you by our office regarding your preparation for the procedure.  We will contact you about holding your blood thinner (Pletal)

## 2013-11-28 ENCOUNTER — Encounter (HOSPITAL_COMMUNITY): Payer: Self-pay

## 2013-11-28 ENCOUNTER — Encounter (HOSPITAL_COMMUNITY): Payer: Self-pay | Admitting: Pharmacy Technician

## 2013-11-28 ENCOUNTER — Encounter (HOSPITAL_COMMUNITY)
Admission: RE | Admit: 2013-11-28 | Discharge: 2013-11-28 | Disposition: A | Discharge status: Home / Self Care | Payer: PRIVATE HEALTH INSURANCE | Source: Ambulatory Visit | Attending: Anesthesiology | Admitting: Anesthesiology

## 2013-11-28 ENCOUNTER — Encounter (HOSPITAL_COMMUNITY)
Admission: RE | Admit: 2013-11-28 | Discharge: 2013-11-28 | Disposition: A | Discharge status: Home / Self Care | Payer: PRIVATE HEALTH INSURANCE | Source: Ambulatory Visit | Attending: Oral Surgery | Admitting: Oral Surgery

## 2013-11-28 DIAGNOSIS — Z01812 Encounter for preprocedural laboratory examination: Secondary | ICD-10-CM | POA: Diagnosis present

## 2013-11-28 DIAGNOSIS — Z01818 Encounter for other preprocedural examination: Secondary | ICD-10-CM | POA: Insufficient documentation

## 2013-11-28 HISTORY — DX: Dysphagia, unspecified: R13.10

## 2013-11-28 HISTORY — DX: Unspecified osteoarthritis, unspecified site: M19.90

## 2013-11-28 HISTORY — DX: Pneumonia, unspecified organism: J18.9

## 2013-11-28 HISTORY — DX: Urinary tract infection, site not specified: N39.0

## 2013-11-28 LAB — BASIC METABOLIC PANEL
BUN: 22 mg/dL (ref 6–23)
CHLORIDE: 98 meq/L (ref 96–112)
CO2: 24 mEq/L (ref 19–32)
Calcium: 10 mg/dL (ref 8.4–10.5)
Creatinine, Ser: 1.18 mg/dL — ABNORMAL HIGH (ref 0.50–1.10)
GFR calc non Af Amer: 43 mL/min — ABNORMAL LOW (ref >90)
GFR, EST AFRICAN AMERICAN: 50 mL/min — AB (ref >90)
Glucose, Bld: 94 mg/dL (ref 70–99)
POTASSIUM: 5.5 meq/L — AB (ref 3.7–5.3)
Sodium: 137 mEq/L (ref 137–147)

## 2013-11-28 LAB — CBC
HCT: 35.8 % — ABNORMAL LOW (ref 36.0–46.0)
HEMOGLOBIN: 11.7 g/dL — AB (ref 12.0–15.0)
MCH: 28.6 pg (ref 26.0–34.0)
MCHC: 32.7 g/dL (ref 30.0–36.0)
MCV: 87.5 fL (ref 78.0–100.0)
Platelets: 243 10*3/uL (ref 150–400)
RBC: 4.09 MIL/uL (ref 3.87–5.11)
RDW: 12.8 % (ref 11.5–15.5)
WBC: 6 10*3/uL (ref 4.0–10.5)

## 2013-11-28 NOTE — Pre-Procedure Instructions (Addendum)
Ariana Shea  11/28/2013   Your procedure is scheduled on:  12/07/13  Report to [facility] short stay admitting at 530 AM.  Call this number if you have problems the morning of surgery: [phone]   Remember:   Do not eat food or drink liquids after midnight.   Take these medicines the morning of surgery with A SIP OF WATER:carvedilol,omeprazole       Take all meds as ordered until day of surgery except as instructed below or per dr      Ariana Shea all herbel meds, nsaids (aleve,naproxen,advil,ibuprofen) 5 days prior to surgery(12/02/13) including vitamins aspirin,  pletal per dr   Ariana Shea METFORMIN DAY OF SURGERY   Do not wear jewelry, make-up or nail polish.  Do not wear lotions, powders, or perfumes. You may wear deodorant.  Do not shave 48 hours prior to surgery. Men may shave face and neck.  Do not bring valuables to the hospital.  [facility] is not responsible                  for any belongings or valuables.               Contacts, dentures or bridgework may not be worn into surgery.  Leave suitcase in the car. After surgery it may be brought to your room.  For patients admitted to the hospital, discharge time is determined by your                treatment team.               Patients discharged the day of surgery will not be allowed to drive  home.  Name and phone number of your driver:   Special Instructions:  Special Instructions: [place] - Preparing for Surgery  Before surgery, you can play an important role.  Because skin is not sterile, your skin needs to be as free of germs as possible.  You can reduce the number of germs on you skin by washing with CHG (chlorahexidine gluconate) soap before surgery.  CHG is an antiseptic cleaner which kills germs and bonds with the skin to continue killing germs even after washing.  Please DO NOT use if you have an allergy to CHG or antibacterial soaps.  If your skin becomes reddened/irritated stop using the CHG and inform your nurse when  you arrive at Short Stay.  Do not shave (including legs and underarms) for at least 48 hours prior to the first CHG shower.  You may shave your face.  Please follow these instructions carefully:   1.  Shower with CHG Soap the night before surgery and the morning of Surgery.  2.  If you choose to wash your hair, wash your hair first as usual with your normal shampoo.  3.  After you shampoo, rinse your hair and body thoroughly to remove the Shampoo.  4.  Use CHG as you would any other liquid soap.  You can apply chg directly  to the skin and wash gently with scrungie or a clean washcloth.  5.  Apply the CHG Soap to your body ONLY FROM THE NECK DOWN.  Do not use on open wounds or open sores.  Avoid contact with your eyes ears, mouth and genitals (private parts).  Wash genitals (private parts)       with your normal soap.  6.  Wash thoroughly, paying special attention to the area where your surgery will be performed.  7.  Thoroughly  rinse your body with warm water from the neck down.  8.  DO NOT shower/wash with your normal soap after using and rinsing off the CHG Soap.  9.  Pat yourself dry with a clean towel.            10.  Wear clean pajamas.            11.  Place clean sheets on your bed the night of your first shower and do not sleep with pets.  Day of Surgery  Do not apply any lotions/deodorants the morning of surgery.  Please wear clean clothes to the hospital/surgery center.   Please read over the following fact sheets that you were given: Pain Booklet, Coughing and Deep Breathing and Surgical Site Infection Prevention

## 2013-11-28 NOTE — H&P (Signed)
HISTORY AND PHYSICAL  Ariana Shea is a 80 y.o. female patient with CC: Painful teeth  No diagnosis found.  Past Medical History  Diagnosis Date  . Hypertension   . Diabetes mellitus   . 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  . Anemia   . Pure hypercholesterolemia   . GERD (gastroesophageal reflux disease)   . Generalized and unspecified atherosclerosis     No current facility-administered medications for this encounter.   Current Outpatient Prescriptions  Medication Sig Dispense Refill  . acetaminophen (TYLENOL) 500 MG tablet Take 500 mg by mouth 2 (two) times daily.      Marland Kitchen CARVEDILOL PO Take 25 mg by mouth 2 (two) times daily.       . cilostazol (PLETAL) 50 MG tablet Take 1 tablet (50 mg total) by mouth 2 (two) times daily.  180 tablet  2  . Cyanocobalamin (B-12 PO) Take 1 tablet by mouth 2 (two) times daily.       Marland Kitchen lisinopril (PRINIVIL,ZESTRIL) 20 MG tablet Take 20 mg by mouth daily.      . metFORMIN (GLUCOPHAGE) 1000 MG tablet Take 500 mg by mouth 2 (two) times daily.       Marland Kitchen omeprazole (PRILOSEC) 20 MG capsule Take 20 mg by mouth daily.      Marland Kitchen PRAVASTATIN SODIUM PO Take 80 mg by mouth every morning.        Allergies  Allergen Reactions  . Penicillins Rash  . Sulfonamide Derivatives Rash   Active Problems:   * No active hospital problems. *  Vitals: There were no vitals taken for this visit. Lab results:No results found for this or any previous visit (from the past 27 hour(s)). Radiology Results: No results found. General appearance: alert and cooperative Head: Normocephalic, without obvious abnormality, atraumatic Eyes: negative Ears: normal TM's and external ear canals both ears Nose: Nares normal. Septum midline. Mucosa normal. No drainage or sinus tenderness. Throat: Dental caries #'s 3, 4, 5, 6, 7, 8, 9, 10, 11, 12, 22, 23, 24, 25, 26, 27 , Hyperplastic maxillary tuberosities  right and left, pharynx clear Neck: no adenopathy, supple, symmetrical, trachea midline and thyroid not enlarged, symmetric, no tenderness/mass/nodules Resp: clear to auscultation bilaterally Cardio: regular rate and rhythm, S1, S2 normal, no murmur, click, rub or gallop  Assessment:Non-restorable teeth #'s 3, 4, 5, 6, 7, 8, 9, 10, 11, 12, 22, 23, 24, 25, 26, 27, hyperplastic maxillary tuberosities.  Plan:Extraction  teeth #'s 3, 4, 5, 6, 7, 8, 9, 10, 11, 12, 22, 23, 24, 25, 26, 27, alveoloplasty, reduction hyperplastic maxillary tuberosities. General anesthesia. Day surgery.    Takiya Belmares M 11/28/2013

## 2013-11-29 ENCOUNTER — Encounter (HOSPITAL_COMMUNITY): Payer: Self-pay

## 2013-11-29 NOTE — Progress Notes (Signed)
Anesthesia Chart Review:  Patient is a 80 year old female scheduled for multiple teeth extractions on 12/07/2013 by Dr. Hoyt Koch.  History includes HTN, DM2, PAD s/p left CIA stenting '08 with known right CIA occlusion, left carotid endarterectomy 11/2006 and right CEA 11/8614 complicated by post-operative seizure episode (thought to be a reperfusion phenomenon), small SDH following a fall 06/2010, anemia, hypercholesterolemia, GERD. She is a Restaurant manager, fast food. PCP is Dr. Juanita Craver.  Her PAD is followed by cardiologist Dr. Quay Burow last visit with him in 11/2012 and with one of his physician assistants in 05/2013.   EKG on 06/07/13 showed: NSR, possible LAE, septal infarct (age undetermined). Overall, I think her EKG is stable when compared to prior tracing on 11/30/12.    Echo on 07/21/10 showed: Normal LV cavity size. Mild concentric LVH. Normal LV systolic function, EF 83-72%. Normal wall motion. No regional wall motion abnormalities. Trivial aortic regurgitation. Mild mitral regurgitation. Left atrium mildly dilated. No defect or PFO identified in the atrial septum. Mild to moderate tricuspid regurgitation. PA peak pressure 31 mmHg.  She had a normal carotid doppler study on 01/04/13.  CXR on 11/28/13 showed: Underlying emphysema. Mild left base atelectasis with probable small left effusion. Elsewhere lungs are clear. No change in cardiac silhouette.   Preoperative labs noted.  K 5.5, Cr 1.18.  H/H 11.7/35.8. PLT 243K.  (She has refused blood products other than platelets.)  She has a possible small left pleural effusion on CXR--does not appear significant. He RR was 20 with O2 sat 95% at PAT.  There was no documentation of acute respiratory symptoms. She will be further evaluated by her assigned anesthesiologist on the day of surgery.  If no acute cardiopulmonary issues then I would anticipate that she could proceed as planned.  George Hugh [facility]/Anesthesiology Phone  [phone] 11/29/2013 3:40 PM

## 2013-11-30 ENCOUNTER — Other Ambulatory Visit (HOSPITAL_COMMUNITY): Payer: PRIVATE HEALTH INSURANCE

## 2013-12-06 MED ORDER — CLINDAMYCIN PHOSPHATE 600 MG/50ML IV SOLN
600.0000 mg | Freq: Four times a day (QID) | INTRAVENOUS | Status: DC
  Administered 2013-12-07: 600 mg via INTRAVENOUS
  Filled 2013-12-06: qty 50

## 2013-12-07 ENCOUNTER — Encounter (HOSPITAL_COMMUNITY): Payer: PRIVATE HEALTH INSURANCE | Admitting: Vascular Surgery

## 2013-12-07 ENCOUNTER — Ambulatory Visit (HOSPITAL_COMMUNITY)
Admission: RE | Admit: 2013-12-07 | Discharge: 2013-12-07 | Disposition: A | Discharge status: Home / Self Care | Payer: PRIVATE HEALTH INSURANCE | Source: Ambulatory Visit | Attending: Oral Surgery | Admitting: Oral Surgery

## 2013-12-07 ENCOUNTER — Encounter (HOSPITAL_COMMUNITY): Payer: Self-pay | Admitting: *Deleted

## 2013-12-07 ENCOUNTER — Encounter (HOSPITAL_COMMUNITY)
Admission: RE | Disposition: A | Discharge status: Home / Self Care | Payer: Self-pay | Source: Ambulatory Visit | Attending: Oral Surgery

## 2013-12-07 ENCOUNTER — Ambulatory Visit (HOSPITAL_COMMUNITY): Payer: PRIVATE HEALTH INSURANCE | Admitting: Anesthesiology

## 2013-12-07 DIAGNOSIS — I739 Peripheral vascular disease, unspecified: Secondary | ICD-10-CM | POA: Insufficient documentation

## 2013-12-07 DIAGNOSIS — R1310 Dysphagia, unspecified: Secondary | ICD-10-CM

## 2013-12-07 DIAGNOSIS — Z87891 Personal history of nicotine dependence: Secondary | ICD-10-CM | POA: Diagnosis not present

## 2013-12-07 DIAGNOSIS — D649 Anemia, unspecified: Secondary | ICD-10-CM | POA: Insufficient documentation

## 2013-12-07 DIAGNOSIS — K0530 Chronic periodontitis, unspecified: Secondary | ICD-10-CM

## 2013-12-07 DIAGNOSIS — D509 Iron deficiency anemia, unspecified: Secondary | ICD-10-CM

## 2013-12-07 DIAGNOSIS — M2601 Maxillary hyperplasia: Secondary | ICD-10-CM | POA: Diagnosis not present

## 2013-12-07 DIAGNOSIS — K029 Dental caries, unspecified: Secondary | ICD-10-CM

## 2013-12-07 DIAGNOSIS — I159 Secondary hypertension, unspecified: Secondary | ICD-10-CM

## 2013-12-07 DIAGNOSIS — I779 Disorder of arteries and arterioles, unspecified: Secondary | ICD-10-CM

## 2013-12-07 DIAGNOSIS — E7800 Pure hypercholesterolemia, unspecified: Secondary | ICD-10-CM | POA: Diagnosis not present

## 2013-12-07 DIAGNOSIS — D126 Benign neoplasm of colon, unspecified: Secondary | ICD-10-CM

## 2013-12-07 DIAGNOSIS — I10 Essential (primary) hypertension: Secondary | ICD-10-CM | POA: Diagnosis not present

## 2013-12-07 DIAGNOSIS — Z7902 Long term (current) use of antithrombotics/antiplatelets: Secondary | ICD-10-CM | POA: Diagnosis not present

## 2013-12-07 DIAGNOSIS — E119 Type 2 diabetes mellitus without complications: Secondary | ICD-10-CM | POA: Diagnosis not present

## 2013-12-07 DIAGNOSIS — K219 Gastro-esophageal reflux disease without esophagitis: Secondary | ICD-10-CM | POA: Diagnosis not present

## 2013-12-07 DIAGNOSIS — K089 Disorder of teeth and supporting structures, unspecified: Secondary | ICD-10-CM | POA: Diagnosis present

## 2013-12-07 HISTORY — PX: MULTIPLE EXTRACTIONS WITH ALVEOLOPLASTY: SHX5342

## 2013-12-07 LAB — GLUCOSE, CAPILLARY
GLUCOSE-CAPILLARY: 117 mg/dL — AB (ref 70–99)
GLUCOSE-CAPILLARY: 129 mg/dL — AB (ref 70–99)

## 2013-12-07 SURGERY — MULTIPLE EXTRACTION WITH ALVEOLOPLASTY
Anesthesia: General | Site: Mouth

## 2013-12-07 MED ORDER — ONDANSETRON HCL 4 MG/2ML IJ SOLN
4.0000 mg | Freq: Once | INTRAMUSCULAR | Status: AC
  Administered 2013-12-07: 4 mg via INTRAVENOUS

## 2013-12-07 MED ORDER — SUCCINYLCHOLINE CHLORIDE 20 MG/ML IJ SOLN
INTRAMUSCULAR | Status: AC
  Filled 2013-12-07: qty 1

## 2013-12-07 MED ORDER — ARTIFICIAL TEARS OP OINT
TOPICAL_OINTMENT | OPHTHALMIC | Status: DC | PRN
  Administered 2013-12-07: 1 via OPHTHALMIC

## 2013-12-07 MED ORDER — PROMETHAZINE HCL 25 MG/ML IJ SOLN
INTRAMUSCULAR | Status: AC
  Filled 2013-12-07: qty 1

## 2013-12-07 MED ORDER — ROCURONIUM BROMIDE 50 MG/5ML IV SOLN
INTRAVENOUS | Status: AC
  Filled 2013-12-07: qty 1

## 2013-12-07 MED ORDER — PROPOFOL 10 MG/ML IV BOLUS
INTRAVENOUS | Status: AC
  Filled 2013-12-07: qty 20

## 2013-12-07 MED ORDER — OXYCODONE-ACETAMINOPHEN 5-325 MG PO TABS: 1.0000 | ORAL_TABLET | ORAL | Status: DC | PRN

## 2013-12-07 MED ORDER — LACTATED RINGERS IV SOLN
INTRAVENOUS | Status: DC | PRN
  Administered 2013-12-07: 07:00:00 via INTRAVENOUS

## 2013-12-07 MED ORDER — PHENYLEPHRINE HCL 10 MG/ML IJ SOLN
INTRAMUSCULAR | Status: DC | PRN
  Administered 2013-12-07 (×2): 40 ug via INTRAVENOUS

## 2013-12-07 MED ORDER — OXYMETAZOLINE HCL 0.05 % NA SOLN
NASAL | Status: DC | PRN
  Administered 2013-12-07: 2 via NASAL

## 2013-12-07 MED ORDER — ROCURONIUM BROMIDE 100 MG/10ML IV SOLN
INTRAVENOUS | Status: DC | PRN
  Administered 2013-12-07: 30 mg via INTRAVENOUS

## 2013-12-07 MED ORDER — FENTANYL CITRATE 0.05 MG/ML IJ SOLN
INTRAMUSCULAR | Status: DC | PRN
  Administered 2013-12-07: 100 ug via INTRAVENOUS
  Administered 2013-12-07: 50 ug via INTRAVENOUS

## 2013-12-07 MED ORDER — PROMETHAZINE HCL 25 MG/ML IJ SOLN
6.2500 mg | INTRAMUSCULAR | Status: DC | PRN
  Administered 2013-12-07: 6.25 mg via INTRAVENOUS

## 2013-12-07 MED ORDER — LIDOCAINE HCL (CARDIAC) 20 MG/ML IV SOLN
INTRAVENOUS | Status: DC | PRN
  Administered 2013-12-07: 80 mg via INTRAVENOUS

## 2013-12-07 MED ORDER — ARTIFICIAL TEARS OP OINT
TOPICAL_OINTMENT | OPHTHALMIC | Status: AC
  Filled 2013-12-07: qty 10.5

## 2013-12-07 MED ORDER — ONDANSETRON HCL 4 MG/2ML IJ SOLN
INTRAMUSCULAR | Status: AC
  Filled 2013-12-07: qty 2

## 2013-12-07 MED ORDER — NEOSTIGMINE METHYLSULFATE 10 MG/10ML IV SOLN
INTRAVENOUS | Status: DC | PRN
  Administered 2013-12-07: 5 mg via INTRAVENOUS

## 2013-12-07 MED ORDER — LIDOCAINE HCL (CARDIAC) 20 MG/ML IV SOLN
INTRAVENOUS | Status: AC
  Filled 2013-12-07: qty 5

## 2013-12-07 MED ORDER — LIDOCAINE-EPINEPHRINE 2 %-1:100000 IJ SOLN
INTRAMUSCULAR | Status: DC | PRN
  Administered 2013-12-07: 20 mL

## 2013-12-07 MED ORDER — LIDOCAINE-EPINEPHRINE 2 %-1:100000 IJ SOLN
INTRAMUSCULAR | Status: AC
  Filled 2013-12-07: qty 1

## 2013-12-07 MED ORDER — PROPOFOL 10 MG/ML IV BOLUS
INTRAVENOUS | Status: DC | PRN
  Administered 2013-12-07: 150 mg via INTRAVENOUS

## 2013-12-07 MED ORDER — FENTANYL CITRATE 0.05 MG/ML IJ SOLN
INTRAMUSCULAR | Status: AC
  Filled 2013-12-07: qty 5

## 2013-12-07 MED ORDER — FENTANYL CITRATE 0.05 MG/ML IJ SOLN
25.0000 ug | INTRAMUSCULAR | Status: DC | PRN
  Administered 2013-12-07 (×2): 25 ug via INTRAVENOUS

## 2013-12-07 MED ORDER — MIDAZOLAM HCL 2 MG/2ML IJ SOLN
INTRAMUSCULAR | Status: AC
  Filled 2013-12-07: qty 2

## 2013-12-07 MED ORDER — 0.9 % SODIUM CHLORIDE (POUR BTL) OPTIME
TOPICAL | Status: DC | PRN
  Administered 2013-12-07: 1000 mL

## 2013-12-07 MED ORDER — OXYMETAZOLINE HCL 0.05 % NA SOLN
NASAL | Status: AC
  Filled 2013-12-07: qty 15

## 2013-12-07 MED ORDER — FENTANYL CITRATE 0.05 MG/ML IJ SOLN
INTRAMUSCULAR | Status: AC
  Administered 2013-12-07: 25 ug via INTRAVENOUS
  Filled 2013-12-07: qty 2

## 2013-12-07 MED ORDER — ONDANSETRON HCL 4 MG/2ML IJ SOLN
INTRAMUSCULAR | Status: DC | PRN
  Administered 2013-12-07: 4 mg via INTRAVENOUS

## 2013-12-07 MED ORDER — CLINDAMYCIN HCL 300 MG PO CAPS: 300.0000 mg | ORAL_CAPSULE | Freq: Three times a day (TID) | ORAL | Status: DC

## 2013-12-07 MED ORDER — GLYCOPYRROLATE 0.2 MG/ML IJ SOLN
INTRAMUSCULAR | Status: DC | PRN
  Administered 2013-12-07: 0.2 mg via INTRAVENOUS
  Administered 2013-12-07: .8 mg via INTRAVENOUS

## 2013-12-07 MED ORDER — GLYCOPYRROLATE 0.2 MG/ML IJ SOLN
INTRAMUSCULAR | Status: AC
  Filled 2013-12-07: qty 5

## 2013-12-07 SURGICAL SUPPLY — 31 items
BLADE 10 SAFETY STRL DISP (BLADE) ×1 IMPLANT
BUR CROSS CUT FISSURE 1.6 (BURR) ×2 IMPLANT
BUR CROSS CUT FISSURE 1.6MM (BURR) ×1
BUR EGG ELITE 4.0 (BURR) ×2 IMPLANT
BUR EGG ELITE 4.0MM (BURR) ×1
CANISTER SUCTION 2500CC (MISCELLANEOUS) ×3 IMPLANT
COVER SURGICAL LIGHT HANDLE (MISCELLANEOUS) ×3 IMPLANT
CRADLE DONUT ADULT HEAD (MISCELLANEOUS) ×3 IMPLANT
GAUZE PACKING FOLDED 2  STR (GAUZE/BANDAGES/DRESSINGS) ×2
GAUZE PACKING FOLDED 2 STR (GAUZE/BANDAGES/DRESSINGS) ×1 IMPLANT
GLOVE BIO SURGEON STRL SZ 6.5 (GLOVE) ×2 IMPLANT
GLOVE BIO SURGEON STRL SZ7.5 (GLOVE) ×3 IMPLANT
GLOVE BIO SURGEONS STRL SZ 6.5 (GLOVE)
GLOVE BIOGEL PI IND STRL 7.0 (GLOVE) ×2 IMPLANT
GLOVE BIOGEL PI INDICATOR 7.0 (GLOVE)
GOWN STRL REUS W/ TWL LRG LVL3 (GOWN DISPOSABLE) ×2 IMPLANT
GOWN STRL REUS W/ TWL XL LVL3 (GOWN DISPOSABLE) ×1 IMPLANT
GOWN STRL REUS W/TWL LRG LVL3 (GOWN DISPOSABLE) ×3
GOWN STRL REUS W/TWL XL LVL3 (GOWN DISPOSABLE) ×3
KIT BASIN OR (CUSTOM PROCEDURE TRAY) ×3 IMPLANT
KIT ROOM TURNOVER OR (KITS) ×3 IMPLANT
NEEDLE 22X1 1/2 (OR ONLY) (NEEDLE) ×3 IMPLANT
NS IRRIG 1000ML POUR BTL (IV SOLUTION) ×3 IMPLANT
PAD ARMBOARD 7.5X6 YLW CONV (MISCELLANEOUS) ×6 IMPLANT
SUT CHROMIC 3 0 PS 2 (SUTURE) ×6 IMPLANT
SYR CONTROL 10ML LL (SYRINGE) ×3 IMPLANT
TOWEL OR 17X24 6PK STRL BLUE (TOWEL DISPOSABLE) ×4 IMPLANT
TOWEL OR 17X26 10 PK STRL BLUE (TOWEL DISPOSABLE) ×1 IMPLANT
TRAY ENT MC OR (CUSTOM PROCEDURE TRAY) ×3 IMPLANT
TUBING IRRIGATION (MISCELLANEOUS) ×3 IMPLANT
YANKAUER SUCT BULB TIP NO VENT (SUCTIONS) ×3 IMPLANT

## 2013-12-07 NOTE — Anesthesia Preprocedure Evaluation (Addendum)
Anesthesia Evaluation  Patient identified by MRN, date of birth, ID band Patient awake    Reviewed: Allergy & Precautions, H&P , NPO status , Patient's Chart, lab work & pertinent test results, reviewed documented beta blocker date and time   Airway Mallampati: II TM Distance: >3 FB     Dental  (+) Poor Dentition, Dental Advisory Given   Pulmonary pneumonia -, resolved, former smoker,          Cardiovascular hypertension, Pt. on home beta blockers + Peripheral Vascular Disease Rhythm:Regular Rate:Normal     Neuro/Psych    GI/Hepatic GERD-  ,  Endo/Other  diabetes, Type 2, Oral Hypoglycemic Agents  Renal/GU      Musculoskeletal   Abdominal   Peds  Hematology  (+) anemia ,   Anesthesia Other Findings   Reproductive/Obstetrics                          Anesthesia Physical Anesthesia Plan  ASA: III  Anesthesia Plan: General   Post-op Pain Management:    Induction: Intravenous  Airway Management Planned: Nasal ETT  Additional Equipment:   Intra-op Plan:   Post-operative Plan: Extubation in OR and Possible Post-op intubation/ventilation  Informed Consent:   Dental advisory given  Plan Discussed with: CRNA, Anesthesiologist and Surgeon  Anesthesia Plan Comments:        Anesthesia Quick Evaluation

## 2013-12-07 NOTE — Op Note (Signed)
Ariana Shea, [name]:  [id number]  MEDICAL RECORD NO.:  [id number]  LOCATION:  [facility]                         FACILITY:  [place]  PHYSICIAN:  Gae Bon, M.D.  DATE OF BIRTH:  05-23-1934  DATE OF PROCEDURE:  12/07/2013 DATE OF DISCHARGE:                              OPERATIVE REPORT   PREOPERATIVE DIAGNOSES: 1. Nonrestorable teeth #3, 4, 5, 6, 7, 8, 9, 10, 11, 12, 22, 23, 24,     25, 26, 27, secondary to dental caries and periodontitis. 2. Hyperplastic maxillary fibrous tuberosities, bilateral.  POSTOPERATIVE DIAGNOSES: 1. Nonrestorable teeth #3, 4, 5, 6, 7, 8, 9, 10, 11, 12, 22, 23, 24,     25, 26, 27, secondary to dental caries and periodontitis. 2. Hyperplastic maxillary fibrous tuberosities, bilateral.  PROCEDURE: 1. Extraction of teeth #3, 4, 5, 6, 7, 8, 9, 10, 11, 12, 22, 23, 24,     25, 26, 27. 2. Alveoplasty of right and left maxilla and mandible. 3. Removal of bilateral hyperplastic maxillary fibrous tuberosities.  SURGEON:  Gae Bon, M.D.  ANESTHESIA:  General, Dr. Oletta Lamas.  Nasal intubation.  PROCEDURE IN DETAIL:  The patient was taken to the operating room, placed on the table in supine position.  General anesthesia was administered intravenously and nasal endotracheal tube was placed and secured.  The eyes were protected.  The patient was draped for the procedure.  Time-out was performed.  The posterior pharynx was suctioned and a throat pack was placed.  Lidocaine 2% with 1:100,000 epinephrine was infiltrated in an inferior alveolar block on the right and left side and buccal and palatal infiltration in the maxilla around the teeth to be removed.  Some buccal infiltration was also administered in the anterior mandible, a total of 17 mL of solution was utilized.  A bite block was placed in the right side of the mouth and a sweetheart retractor was used to retract the tongue.  The left side was operated first.  #15 blade was  used to make an incision around teeth numbers 22 through 27 on the buccal and lingual surfaces.  Distal extensions were created approximately 1 cm with a wedge of tissue removed to allow for primary closure.  In the maxilla, the 15 blade was used to make an incision beginning at tooth #12 carrying across the midline at tooth #6. The incision was made on the buccal and palatal surfaces of the teeth. Then, the periosteal elevator was used to reflect the periosteum in the maxilla and mandible on the left side and then the teeth were elevated with a 301 elevator and removed with a dental forceps.  Then, the bite block and sweetheart retractor were repositioned to the other side of the mouth and teeth #3, 4, and 5 were extracted using the 15 blade, a periosteal elevator, a 301 elevator, and dental forceps.  Then the sockets were curetted.  The periosteum was further reflected to expose the alveolar crest and then alveoplasty was performed in the maxilla and mandible using the egg-shaped bur and bone file.  Then, the tuberosities were reduced using a 15 blade making elliptical incisions.  This removed the fibrous tissue.  Then, the areas were irrigated and closed with 3-0 chromic.  The oral cavity was inspected, found to have good contour, hemostasis, and closure.  The oral cavity was irrigated, suctioned, throat pack was removed.  The patient was awakened and taken to the recovery room, breathing spontaneously in good condition.  ESTIMATED BLOOD LOSS:  Minimal.  COMPLICATIONS:  None.  SPECIMENS:  None.     Gae Bon, M.D.     SMJ/MEDQ  D:  12/07/2013  T:  12/07/2013  Job:  [id number]

## 2013-12-07 NOTE — Discharge Instructions (Signed)

## 2013-12-07 NOTE — Anesthesia Procedure Notes (Signed)
Procedure Name: Intubation Date/Time: 12/07/2013 7:42 AM Performed by: Ollen Bowl Pre-anesthesia Checklist: Patient identified, Timeout performed, Emergency Drugs available, Suction available and Patient being monitored Patient Re-evaluated:Patient Re-evaluated prior to inductionOxygen Delivery Method: Circle system utilized and Simple face mask Preoxygenation: Pre-oxygenation with 100% oxygen Intubation Type: Combination inhalational/ intravenous induction Ventilation: Mask ventilation without difficulty Laryngoscope Size: Miller and 2 Grade View: Grade I Nasal Tubes: Right Tube size: 7.0 mm Number of attempts: 1 Airway Equipment and Method: Patient positioned with wedge pillow Placement Confirmation: ETT inserted through vocal cords under direct vision,  positive ETCO2 and breath sounds checked- equal and bilateral Tube secured with: Tape Dental Injury: Teeth and Oropharynx as per pre-operative assessment

## 2013-12-07 NOTE — Transfer of Care (Signed)
Immediate Anesthesia Transfer of Care Note  Patient: Ariana Shea  Procedure(s) Performed: Procedure(s): MULTIPLE EXTRACTION WITH ALVEOLOPLASTY (N/A)  Patient Location: PACU  Anesthesia Type:General  Level of Consciousness: awake and alert   Airway & Oxygen Therapy: Patient Spontanous Breathing and Patient connected to face mask oxygen  Post-op Assessment: Report given to PACU RN and Post -op Vital signs reviewed and stable  Post vital signs: Reviewed and stable  Complications: No apparent anesthesia complications

## 2013-12-07 NOTE — Op Note (Signed)
12/07/2013  8:29 AM  PATIENT:  Yong Channel  80 y.o. female  PRE-OPERATIVE DIAGNOSIS:  Dental caries, peridontitis, non-restorable teeth#s 3, 4, 5, 6, 7, 8, 9, 10, 11, 12, 22, 23, 24, 25, 26, 27, Hyperplastic maxillary fibrous tuberosities bilateral  POST-OPERATIVE DIAGNOSIS:  SAME  PROCEDURE:  Procedure(s): MULTIPLE EXTRACTION  teeth#s 3, 4, 5, 6, 7, 8, 9, 10, 11, 12, 22, 23, 24, 25, 26, 27, Alveoloplasty, Removal hyperplastic maxillary fibrous tuberosities bilateralWITH ALVEOLOPLASTY  SURGEON:  Surgeon(s): Gae Bon, DDS  ANESTHESIA:   local and general  EBL:  minimal  DRAINS: none   SPECIMEN:  No Specimen  COUNTS:  YES  PLAN OF CARE: Discharge to home after PACU  PATIENT DISPOSITION:  PACU - hemodynamically stable.   PROCEDURE DETAILS: Dictation #[id number]  Gae Bon, DMD 12/07/2013 8:29 AM

## 2013-12-07 NOTE — Anesthesia Postprocedure Evaluation (Signed)
  Anesthesia Post-op Note  Patient: Ariana Shea  Procedure(s) Performed: Procedure(s): MULTIPLE EXTRACTION WITH ALVEOLOPLASTY (N/A)  Patient Location: PACU  Anesthesia Type:General  Level of Consciousness: awake  Airway and Oxygen Therapy: Patient Spontanous Breathing  Post-op Pain: mild  Post-op Assessment: Post-op Vital signs reviewed  Post-op Vital Signs: Reviewed  Last Vitals:  Filed Vitals:   12/07/13 0612  BP: 178/77  Pulse:   Temp:   Resp:     Complications: No apparent anesthesia complications

## 2013-12-07 NOTE — H&P (Signed)
H&P documentation  -History and Physical Reviewed  -Patient has been re-examined  -No change in the plan of care  JENSEN,SCOTT M  

## 2013-12-11 ENCOUNTER — Encounter (HOSPITAL_COMMUNITY): Payer: Self-pay | Admitting: Oral Surgery

## 2013-12-24 ENCOUNTER — Emergency Department (HOSPITAL_COMMUNITY): Payer: PRIVATE HEALTH INSURANCE

## 2013-12-24 ENCOUNTER — Encounter (HOSPITAL_COMMUNITY): Payer: Self-pay | Admitting: Emergency Medicine

## 2013-12-24 ENCOUNTER — Emergency Department (HOSPITAL_COMMUNITY)
Admission: EM | Admit: 2013-12-24 | Discharge: 2013-12-24 | Disposition: A | Discharge status: Home / Self Care | Payer: PRIVATE HEALTH INSURANCE | Attending: Emergency Medicine | Admitting: Emergency Medicine

## 2013-12-24 DIAGNOSIS — W1809XA Striking against other object with subsequent fall, initial encounter: Secondary | ICD-10-CM | POA: Insufficient documentation

## 2013-12-24 DIAGNOSIS — I10 Essential (primary) hypertension: Secondary | ICD-10-CM | POA: Insufficient documentation

## 2013-12-24 DIAGNOSIS — E7800 Pure hypercholesterolemia, unspecified: Secondary | ICD-10-CM | POA: Diagnosis not present

## 2013-12-24 DIAGNOSIS — Z79899 Other long term (current) drug therapy: Secondary | ICD-10-CM | POA: Insufficient documentation

## 2013-12-24 DIAGNOSIS — Z87891 Personal history of nicotine dependence: Secondary | ICD-10-CM | POA: Diagnosis not present

## 2013-12-24 DIAGNOSIS — D649 Anemia, unspecified: Secondary | ICD-10-CM | POA: Insufficient documentation

## 2013-12-24 DIAGNOSIS — S0990XA Unspecified injury of head, initial encounter: Secondary | ICD-10-CM | POA: Insufficient documentation

## 2013-12-24 DIAGNOSIS — Z8701 Personal history of pneumonia (recurrent): Secondary | ICD-10-CM | POA: Diagnosis not present

## 2013-12-24 DIAGNOSIS — E119 Type 2 diabetes mellitus without complications: Secondary | ICD-10-CM | POA: Diagnosis not present

## 2013-12-24 DIAGNOSIS — Z880 Allergy status to penicillin: Secondary | ICD-10-CM | POA: Insufficient documentation

## 2013-12-24 DIAGNOSIS — Y929 Unspecified place or not applicable: Secondary | ICD-10-CM | POA: Diagnosis not present

## 2013-12-24 DIAGNOSIS — Y9389 Activity, other specified: Secondary | ICD-10-CM | POA: Insufficient documentation

## 2013-12-24 DIAGNOSIS — Z792 Long term (current) use of antibiotics: Secondary | ICD-10-CM | POA: Insufficient documentation

## 2013-12-24 DIAGNOSIS — Z87440 Personal history of urinary (tract) infections: Secondary | ICD-10-CM | POA: Insufficient documentation

## 2013-12-24 DIAGNOSIS — M129 Arthropathy, unspecified: Secondary | ICD-10-CM | POA: Insufficient documentation

## 2013-12-24 DIAGNOSIS — K219 Gastro-esophageal reflux disease without esophagitis: Secondary | ICD-10-CM | POA: Diagnosis not present

## 2013-12-24 NOTE — ED Notes (Signed)
Patient arrives via EMS from home with c/o fall. Fall from standing position, witnessed by family member. No LOC. C/o generalized weakness. Alert/oriented x 4.

## 2013-12-24 NOTE — ED Notes (Signed)
Patient with no complaints at this time. Respirations even and unlabored. Skin warm/dry. Discharge instructions reviewed with patient at this time. Patient given opportunity to voice concerns/ask questions. Patient discharged at this time and left Emergency Department with steady gait.   

## 2013-12-24 NOTE — Discharge Instructions (Signed)
Tylenol for pain.  Follow-up as needed 

## 2013-12-24 NOTE — ED Provider Notes (Signed)
CSN: [id number]     Arrival date & time 12/24/13  1517 History  This chart was scribed for Maudry Diego, MD by Elby Beck, ED Scribe. This patient was seen in room APA02/APA02 and the patient's care was started at 3:36 PM.   Chief Complaint  Patient presents with  . Fall    Patient is a 80 y.o. female presenting with fall. The history is provided by the patient. No language interpreter was used.  Fall This is a new problem. The current episode started 1 to 2 hours ago. The problem occurs rarely. The problem has not changed since onset.Associated symptoms include headaches. Pertinent negatives include no chest pain and no abdominal pain. Nothing aggravates the symptoms. Nothing relieves the symptoms. She has tried nothing for the symptoms.    HPI Comments: Ariana Shea is a 80 y.o. female brought by EMS to the Emergency Department complaining of a mechanical that occurred earlier today. She states that she fell backwards from a standing position. She reports that she hit the back of her head upon landing. She is complaining of constant, moderate pain in the back of her head onset after the fall. She denies any LOC. Pt reports that she is on Pletal.   Past Medical History  Diagnosis Date  . Hypertension   . Diabetes mellitus   . 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  . Anemia   . Pure hypercholesterolemia   . GERD (gastroesophageal reflux disease)   . Generalized and unspecified atherosclerosis   . Pneumonia     hx  . UTI (lower urinary tract infection)     recent tx   . Arthritis   . Swallowing difficulty     gets crumbs caught in throat-told may have to have esophagus streched   Past Surgical History  Procedure Laterality Date  . Abdominal hysterectomy    . Cholecystectomy    . Bladder surgery      tuck  . Artery repair Bilateral     corotid artery surgery  . Carotid endarterectomy     bilateral CEA 2008; (L) 11/2006, (R) 01/2007 with reperfusion phenomenon (seizure) post-operaitvely  . Multiple extractions with alveoloplasty N/A 12/07/2013    Procedure: MULTIPLE EXTRACTION WITH ALVEOLOPLASTY;  Surgeon: Gae Bon, DDS;  Location: [facility];  Service: Oral Surgery;  Laterality: N/A;   Family History  Problem Relation Age of Onset  . Heart disease Father    History  Substance Use Topics  . Smoking status: Former Smoker -- 1.00 packs/day for 56 years    Types: Cigarettes    Quit date: 11/28/2008  . Smokeless tobacco: Never Used  . Alcohol Use: No     Comment: quit   OB History   Grav Para Term Preterm Abortions TAB SAB Ect Mult Living                 Review of Systems  Constitutional: Negative for appetite change and fatigue.  HENT: Negative for congestion, ear discharge and sinus pressure.   Eyes: Negative for discharge.  Respiratory: Negative for cough.   Cardiovascular: Negative for chest pain.  Gastrointestinal: Negative for abdominal pain and diarrhea.  Genitourinary: Negative for frequency and hematuria.  Musculoskeletal: Negative for back pain.  Skin: Negative for rash.  Neurological: Positive for headaches. Negative for seizures.  Psychiatric/Behavioral: Negative for hallucinations.    Allergies  Penicillins and Sulfonamide derivatives  Home Medications  Prior to Admission medications   Medication Sig Start Date End Date Taking? Authorizing Provider  carvedilol (COREG) 25 MG tablet Take 25 mg by mouth 2 (two) times daily with a meal.    Historical Provider, MD  cilostazol (PLETAL) 50 MG tablet Take 1 tablet (50 mg total) by mouth 2 (two) times daily. 11/09/13   Lorretta Harp, MD  clindamycin (CLEOCIN) 300 MG capsule Take 1 capsule (300 mg total) by mouth 3 (three) times daily. 12/07/13   Gae Bon, DDS  iron polysaccharides (NIFEREX) 150 MG capsule Take 150 mg by mouth daily.    Historical Provider, MD  lisinopril (PRINIVIL,ZESTRIL) 20 MG  tablet Take 20 mg by mouth daily.    Historical Provider, MD  metFORMIN (GLUCOPHAGE) 1000 MG tablet Take 1,000 mg by mouth 2 (two) times daily with a meal.    Historical Provider, MD  omeprazole (PRILOSEC) 20 MG capsule Take 20 mg by mouth daily.    Historical Provider, MD  oxyCODONE-acetaminophen (PERCOCET) 5-325 MG per tablet Take 1-2 tablets by mouth every 4 (four) hours as needed for severe pain. 12/07/13   Gae Bon, DDS  pravastatin (PRAVACHOL) 80 MG tablet Take 80 mg by mouth daily.    Historical Provider, MD   Triage Vitals: BP 135/69  Pulse 73  Temp(Src) 97.9 F (36.6 C) (Oral)  Resp 19  Ht 5\' 3"  (1.6 m)  Wt 119 lb (53.978 kg)  BMI 21.09 kg/m2  SpO2 97%  Physical Exam  Nursing note and vitals reviewed. Constitutional: She is oriented to person, place, and time. She appears well-developed.  HENT:  Head: Normocephalic.  Minor swelling and tenderness to occipital head  Eyes: Conjunctivae and EOM are normal. No scleral icterus.  Neck: Neck supple. No thyromegaly present.  Cardiovascular: Normal rate and regular rhythm.  Exam reveals no gallop and no friction rub.   No murmur heard. Pulmonary/Chest: No stridor. She has no wheezes. She has no rales. She exhibits no tenderness.  Abdominal: She exhibits no distension. There is no tenderness. There is no rebound.  Musculoskeletal: Normal range of motion. She exhibits no edema.  Lymphadenopathy:    She has no cervical adenopathy.  Neurological: She is oriented to person, place, and time. She exhibits normal muscle tone. Coordination normal.  Skin: No rash noted. No erythema.  Psychiatric: She has a normal mood and affect. Her behavior is normal.    ED Course  Procedures (including critical care time)  DIAGNOSTIC STUDIES: Oxygen Saturation is 97% on RA, normal by my interpretation.    COORDINATION OF CARE: 3:40 PM- Discussed plan to order a CT of pt's head and a CT of pt's C-spine. Pt advised of plan for treatment and pt  agrees.  Labs Review Labs Reviewed - No data to display  Imaging Review No results found.   EKG Interpretation None      MDM   Final diagnoses:  None    Ct neg.  Pt to follow up as needed,  Tylenol for pain The chart was scribed for me under my direct supervision.  I personally performed the history, physical, and medical decision making and all procedures in the evaluation of this patient.Maudry Diego, MD 12/24/13 [phone]

## 2013-12-24 NOTE — ED Notes (Signed)
States headache, hematoma noted to posterior aspect of head. Alert/orietned x 4. Denies n/v.

## 2013-12-24 NOTE — ED Notes (Signed)
Pt ambulated to nursing desk without difficulty.

## 2014-01-01 NOTE — Telephone Encounter (Signed)
Called pt to inform ok to hold pletal per fax sent by Dr Juanita Craver  Hold 5 days before and 2 days after procedure  Pt aware  Will send fax to be scanned in

## 2014-01-01 NOTE — Telephone Encounter (Signed)
Faxed over form for Dr Marlyn Corporal today

## 2014-01-01 NOTE — Telephone Encounter (Signed)
Please advise Dr Marlyn Corporal

## 2014-01-08 ENCOUNTER — Encounter: Payer: Self-pay | Admitting: Gastroenterology

## 2014-01-08 ENCOUNTER — Telehealth: Payer: Self-pay | Admitting: Gastroenterology

## 2014-01-08 ENCOUNTER — Ambulatory Visit (AMBULATORY_SURGERY_CENTER): Payer: PRIVATE HEALTH INSURANCE | Admitting: Gastroenterology

## 2014-01-08 VITALS — BP 186/89 | HR 68 | Temp 98.2°F | Resp 18 | Ht 63.0 in | Wt 119.0 lb

## 2014-01-08 DIAGNOSIS — R1310 Dysphagia, unspecified: Secondary | ICD-10-CM

## 2014-01-08 LAB — GLUCOSE, CAPILLARY
GLUCOSE-CAPILLARY: 90 mg/dL (ref 70–99)
Glucose-Capillary: 150 mg/dL — ABNORMAL HIGH (ref 70–99)

## 2014-01-08 MED ORDER — DEXTROSE 5 % IV SOLN: INTRAVENOUS | Status: DC

## 2014-01-08 NOTE — Telephone Encounter (Signed)
Spoke with patient, and she is following the directions for an EGD.  She was unsure whether to go to [facility] or to come here.  I told her to come here to the 4th floor 1 hour before her procedure.  She is aware of where we are, and also aware that someone has to stay here the entire time that she is with [place].

## 2014-01-08 NOTE — Patient Instructions (Signed)
YOU HAD AN ENDOSCOPIC PROCEDURE TODAY AT [facility]: Refer to the procedure report that was given to you for any specific questions about what was found during the examination.  If the procedure report does not answer your questions, please call your gastroenterologist to clarify.  If you requested that your care partner not be given the details of your procedure findings, then the procedure report has been included in a sealed envelope for you to review at your convenience later.  YOU SHOULD EXPECT: Some feelings of bloating in the abdomen. Passage of more gas than usual.  Walking can help get rid of the air that was put into your GI tract during the procedure and reduce the bloating. If you had a lower endoscopy (such as a colonoscopy or flexible sigmoidoscopy) you may notice spotting of blood in your stool or on the toilet paper. If you underwent a bowel prep for your procedure, then you may not have a normal bowel movement for a few days.  DIET: NOTHING TO EAT OR DRINK UNTIL 3:30 PM. 3:30 UNTIL 4:30 PM ONLY CLEAR LIQUIDS. AFTER 4:30 ONLY SOFT FOODS UNTIL MORNING. RESUME YOUR REGULAR DIET IN AM..  ACTIVITY: Your care partner should take you home directly after the procedure.  You should plan to take it easy, moving slowly for the rest of the day.  You can resume normal activity the day after the procedure however you should NOT DRIVE or use heavy machinery for 24 hours (because of the sedation medicines used during the test).    SYMPTOMS TO REPORT IMMEDIATELY: A gastroenterologist can be reached at any hour.  During normal business hours, 8:30 AM to 5:00 PM Monday through Friday, call [phone].  After hours and on weekends, please call the GI answering service at [phone] who will take a message and have the physician on call contact you.  Following upper endoscopy (EGD)  Vomiting of blood or coffee ground material  New chest pain or pain under the shoulder  blades  Painful or persistently difficult swallowing  New shortness of breath  Fever of 100F or higher  Black, tarry-looking stools  FOLLOW UP: If any biopsies were taken you will be contacted by phone or by letter within the next 1-3 weeks.  Call your gastroenterologist if you have not heard about the biopsies in 3 weeks.  Our staff will call the home number listed on your records the next business day following your procedure to check on you and address any questions or concerns that you may have at that time regarding the information given to you following your procedure. This is a courtesy call and so if there is no answer at the home number and we have not heard from you through the emergency physician on call, we will assume that you have returned to your regular daily activities without incident.  SIGNATURES/CONFIDENTIALITY: You and/or your care partner have signed paperwork which will be entered into your electronic medical record.  These signatures attest to the fact that that the information above on your After Visit Summary has been reviewed and is understood.  Full responsibility of the confidentiality of this discharge information lies with you and/or your care-partner.

## 2014-01-08 NOTE — Progress Notes (Signed)
Called to room to assist during endoscopic procedure.  Patient ID and intended procedure confirmed with present staff. Received instructions for my participation in the procedure from the performing physician.  

## 2014-01-08 NOTE — Progress Notes (Signed)
A/ox3, pleased with MAC, report to RN 

## 2014-01-08 NOTE — Op Note (Signed)
[facility]  [address] [place], [id number]   ENDOSCOPY PROCEDURE REPORT  PATIENT: Ariana Shea, Ariana Shea  MR#: [id number] BIRTHDATE: [birth year] , 80  yrs. old GENDER: Female ENDOSCOPIST: Inda Castle, MD REFERRED BY:  Juanita Craver, M.D. PROCEDURE DATE:  01/08/2014 PROCEDURE:  EGD, diagnostic and Maloney dilation of esophagus ASA CLASS:     Class II INDICATIONS:  Dysphagia. MEDICATIONS: MAC sedation, administered by CRNA and propofol (Diprivan) 150mg  IV TOPICAL ANESTHETIC:  DESCRIPTION OF PROCEDURE: After the risks benefits and alternatives of the procedure were thoroughly explained, informed consent was obtained.  The [id number] endoscope was introduced through the mouth and advanced to the third portion of the duodenum. Without limitations.  The instrument was slowly withdrawn as the mucosa was fully examined.      The upper, middle and distal third of the esophagus were carefully inspected and no abnormalities were noted.  The z-line was well seen at the GEJ.  The endoscope was pushed into the fundus which was normal including a retroflexed view.  The antrum, gastric body, first and second part of the duodenum were unremarkable. Retroflexed views revealed no abnormalities.     The scope was then withdrawn from the patient and the procedure completed. Because of complaints of dysphagia to solids a #52 [place] Maloney dilator was passed.  Resistance was minimal.  There was no heme.  COMPLICATIONS: There were no complications. ENDOSCOPIC IMPRESSION: dysphagia without stricture-status post [name] dilation  RECOMMENDATIONS: Office followup if dysphagia continues REPEAT EXAM:  eSigned:  Inda Castle, MD 01/08/2014 2:32 PM   CC:

## 2014-01-09 ENCOUNTER — Telehealth: Payer: Self-pay

## 2014-01-09 NOTE — Telephone Encounter (Signed)
  Follow up Call-  Call back number 01/08/2014  Post procedure Call Back phone  # [phone]  Permission to leave phone message Yes     Patient questions:  Do you have a fever, pain , or abdominal swelling? No. Pain Score  0 *  Have you tolerated food without any problems? Yes.    Have you been able to return to your normal activities? Yes.    Do you have any questions about your discharge instructions: Diet   No. Medications  No. Follow up visit  No.  Do you have questions or concerns about your Care? No.  Actions: * If pain score is 4 or above: No action needed, pain <4.

## 2014-04-05 ENCOUNTER — Other Ambulatory Visit: Payer: Self-pay

## 2014-06-22 ENCOUNTER — Encounter (HOSPITAL_COMMUNITY): Payer: Self-pay | Admitting: Emergency Medicine

## 2014-06-22 ENCOUNTER — Inpatient Hospital Stay (HOSPITAL_COMMUNITY)
Admission: EM | Admit: 2014-06-22 | Discharge: 2014-06-30 | DRG: 683 | Disposition: A | Discharge status: Skilled Nursing Facility | Payer: Medicare Other | Attending: Internal Medicine | Admitting: Internal Medicine

## 2014-06-22 ENCOUNTER — Emergency Department (HOSPITAL_COMMUNITY): Payer: Medicare Other

## 2014-06-22 DIAGNOSIS — M19041 Primary osteoarthritis, right hand: Secondary | ICD-10-CM | POA: Diagnosis present

## 2014-06-22 DIAGNOSIS — Z9049 Acquired absence of other specified parts of digestive tract: Secondary | ICD-10-CM | POA: Diagnosis present

## 2014-06-22 DIAGNOSIS — Z86010 Personal history of colonic polyps: Secondary | ICD-10-CM

## 2014-06-22 DIAGNOSIS — E7800 Pure hypercholesterolemia, unspecified: Secondary | ICD-10-CM | POA: Diagnosis present

## 2014-06-22 DIAGNOSIS — Z7902 Long term (current) use of antithrombotics/antiplatelets: Secondary | ICD-10-CM

## 2014-06-22 DIAGNOSIS — D126 Benign neoplasm of colon, unspecified: Secondary | ICD-10-CM

## 2014-06-22 DIAGNOSIS — Z79899 Other long term (current) drug therapy: Secondary | ICD-10-CM | POA: Diagnosis not present

## 2014-06-22 DIAGNOSIS — E785 Hyperlipidemia, unspecified: Secondary | ICD-10-CM | POA: Diagnosis present

## 2014-06-22 DIAGNOSIS — E872 Acidosis, unspecified: Secondary | ICD-10-CM | POA: Diagnosis present

## 2014-06-22 DIAGNOSIS — E1151 Type 2 diabetes mellitus with diabetic peripheral angiopathy without gangrene: Secondary | ICD-10-CM | POA: Diagnosis present

## 2014-06-22 DIAGNOSIS — D509 Iron deficiency anemia, unspecified: Secondary | ICD-10-CM | POA: Diagnosis present

## 2014-06-22 DIAGNOSIS — J441 Chronic obstructive pulmonary disease with (acute) exacerbation: Secondary | ICD-10-CM | POA: Diagnosis present

## 2014-06-22 DIAGNOSIS — E1159 Type 2 diabetes mellitus with other circulatory complications: Secondary | ICD-10-CM | POA: Diagnosis not present

## 2014-06-22 DIAGNOSIS — M19042 Primary osteoarthritis, left hand: Secondary | ICD-10-CM | POA: Diagnosis present

## 2014-06-22 DIAGNOSIS — Z66 Do not resuscitate: Secondary | ICD-10-CM | POA: Diagnosis present

## 2014-06-22 DIAGNOSIS — Z90710 Acquired absence of both cervix and uterus: Secondary | ICD-10-CM

## 2014-06-22 DIAGNOSIS — R1110 Vomiting, unspecified: Secondary | ICD-10-CM

## 2014-06-22 DIAGNOSIS — I10 Essential (primary) hypertension: Secondary | ICD-10-CM | POA: Diagnosis present

## 2014-06-22 DIAGNOSIS — B961 Klebsiella pneumoniae [K. pneumoniae] as the cause of diseases classified elsewhere: Secondary | ICD-10-CM | POA: Diagnosis present

## 2014-06-22 DIAGNOSIS — T502X5A Adverse effect of carbonic-anhydrase inhibitors, benzothiadiazides and other diuretics, initial encounter: Secondary | ICD-10-CM | POA: Diagnosis present

## 2014-06-22 DIAGNOSIS — K529 Noninfective gastroenteritis and colitis, unspecified: Secondary | ICD-10-CM | POA: Diagnosis present

## 2014-06-22 DIAGNOSIS — E870 Hyperosmolality and hypernatremia: Secondary | ICD-10-CM | POA: Diagnosis present

## 2014-06-22 DIAGNOSIS — R062 Wheezing: Secondary | ICD-10-CM

## 2014-06-22 DIAGNOSIS — R197 Diarrhea, unspecified: Secondary | ICD-10-CM

## 2014-06-22 DIAGNOSIS — Z880 Allergy status to penicillin: Secondary | ICD-10-CM

## 2014-06-22 DIAGNOSIS — N390 Urinary tract infection, site not specified: Secondary | ICD-10-CM | POA: Diagnosis present

## 2014-06-22 DIAGNOSIS — R112 Nausea with vomiting, unspecified: Secondary | ICD-10-CM | POA: Diagnosis present

## 2014-06-22 DIAGNOSIS — Z87891 Personal history of nicotine dependence: Secondary | ICD-10-CM | POA: Diagnosis not present

## 2014-06-22 DIAGNOSIS — D649 Anemia, unspecified: Secondary | ICD-10-CM

## 2014-06-22 DIAGNOSIS — N179 Acute kidney failure, unspecified: Principal | ICD-10-CM | POA: Diagnosis present

## 2014-06-22 DIAGNOSIS — K219 Gastro-esophageal reflux disease without esophagitis: Secondary | ICD-10-CM | POA: Diagnosis present

## 2014-06-22 DIAGNOSIS — E869 Volume depletion, unspecified: Secondary | ICD-10-CM | POA: Diagnosis present

## 2014-06-22 DIAGNOSIS — Z8249 Family history of ischemic heart disease and other diseases of the circulatory system: Secondary | ICD-10-CM | POA: Diagnosis not present

## 2014-06-22 DIAGNOSIS — I739 Peripheral vascular disease, unspecified: Secondary | ICD-10-CM | POA: Diagnosis present

## 2014-06-22 DIAGNOSIS — I714 Abdominal aortic aneurysm, without rupture, unspecified: Secondary | ICD-10-CM | POA: Diagnosis present

## 2014-06-22 DIAGNOSIS — E876 Hypokalemia: Secondary | ICD-10-CM | POA: Diagnosis present

## 2014-06-22 DIAGNOSIS — N200 Calculus of kidney: Secondary | ICD-10-CM

## 2014-06-22 DIAGNOSIS — N1330 Unspecified hydronephrosis: Secondary | ICD-10-CM

## 2014-06-22 HISTORY — DX: Abdominal aortic aneurysm, without rupture: I71.4

## 2014-06-22 HISTORY — DX: Unspecified hydronephrosis: N13.30

## 2014-06-22 HISTORY — DX: Hypomagnesemia: E83.42

## 2014-06-22 LAB — URINALYSIS, ROUTINE W REFLEX MICROSCOPIC
Bilirubin Urine: NEGATIVE
GLUCOSE, UA: NEGATIVE mg/dL
KETONES UR: NEGATIVE mg/dL
Nitrite: NEGATIVE
PROTEIN: 100 mg/dL — AB
Specific Gravity, Urine: 1.025 (ref 1.005–1.030)
Urobilinogen, UA: 0.2 mg/dL (ref 0.0–1.0)
pH: 5.5 (ref 5.0–8.0)

## 2014-06-22 LAB — CBC WITH DIFFERENTIAL/PLATELET
BASOS ABS: 0 10*3/uL (ref 0.0–0.1)
Basophils Relative: 0 % (ref 0–1)
EOS PCT: 0 % (ref 0–5)
Eosinophils Absolute: 0 10*3/uL (ref 0.0–0.7)
HCT: 31.3 % — ABNORMAL LOW (ref 36.0–46.0)
Hemoglobin: 11 g/dL — ABNORMAL LOW (ref 12.0–15.0)
Lymphocytes Relative: 7 % — ABNORMAL LOW (ref 12–46)
Lymphs Abs: 0.5 10*3/uL — ABNORMAL LOW (ref 0.7–4.0)
MCH: 30.2 pg (ref 26.0–34.0)
MCHC: 35.1 g/dL (ref 30.0–36.0)
MCV: 86 fL (ref 78.0–100.0)
Monocytes Absolute: 0.6 10*3/uL (ref 0.1–1.0)
Monocytes Relative: 8 % (ref 3–12)
Neutro Abs: 6.2 10*3/uL (ref 1.7–7.7)
Neutrophils Relative %: 85 % — ABNORMAL HIGH (ref 43–77)
PLATELETS: 161 10*3/uL (ref 150–400)
RBC: 3.64 MIL/uL — ABNORMAL LOW (ref 3.87–5.11)
RDW: 13 % (ref 11.5–15.5)
WBC: 7.2 10*3/uL (ref 4.0–10.5)

## 2014-06-22 LAB — LACTIC ACID, PLASMA: Lactic Acid, Venous: 0.9 mmol/L (ref 0.5–2.2)

## 2014-06-22 LAB — BASIC METABOLIC PANEL
Anion gap: 24 — ABNORMAL HIGH (ref 5–15)
BUN: 156 mg/dL — ABNORMAL HIGH (ref 6–23)
CALCIUM: 6.2 mg/dL — AB (ref 8.4–10.5)
CO2: 12 mmol/L — ABNORMAL LOW (ref 19–32)
Chloride: 97 mEq/L (ref 96–112)
Creatinine, Ser: 8.91 mg/dL — ABNORMAL HIGH (ref 0.50–1.10)
GFR calc Af Amer: 4 mL/min — ABNORMAL LOW (ref >90)
GFR calc non Af Amer: 4 mL/min — ABNORMAL LOW (ref >90)
GLUCOSE: 115 mg/dL — AB (ref 70–99)
Potassium: 4.1 mmol/L (ref 3.5–5.1)
SODIUM: 133 mmol/L — AB (ref 135–145)

## 2014-06-22 LAB — COMPREHENSIVE METABOLIC PANEL
ALT: 17 U/L (ref 0–35)
AST: 15 U/L (ref 0–37)
Albumin: 2.7 g/dL — ABNORMAL LOW (ref 3.5–5.2)
Alkaline Phosphatase: 54 U/L (ref 39–117)
Anion gap: 24 — ABNORMAL HIGH (ref 5–15)
BUN: 147 mg/dL — AB (ref 6–23)
CALCIUM: 5.8 mg/dL — AB (ref 8.4–10.5)
CO2: 11 mmol/L — ABNORMAL LOW (ref 19–32)
CREATININE: 8.54 mg/dL — AB (ref 0.50–1.10)
Chloride: 100 mEq/L (ref 96–112)
GFR, EST AFRICAN AMERICAN: 5 mL/min — AB (ref >90)
GFR, EST NON AFRICAN AMERICAN: 4 mL/min — AB (ref >90)
Glucose, Bld: 107 mg/dL — ABNORMAL HIGH (ref 70–99)
Potassium: 4.1 mmol/L (ref 3.5–5.1)
Sodium: 135 mmol/L (ref 135–145)
TOTAL PROTEIN: 5.2 g/dL — AB (ref 6.0–8.3)
Total Bilirubin: 0.7 mg/dL (ref 0.3–1.2)

## 2014-06-22 LAB — GLUCOSE, CAPILLARY: Glucose-Capillary: 115 mg/dL — ABNORMAL HIGH (ref 70–99)

## 2014-06-22 LAB — POC OCCULT BLOOD, ED: Fecal Occult Bld: POSITIVE — AB

## 2014-06-22 LAB — URINE MICROSCOPIC-ADD ON

## 2014-06-22 LAB — SALICYLATE LEVEL

## 2014-06-22 MED ORDER — SODIUM CHLORIDE 0.9 % IV SOLN: INTRAVENOUS | Status: DC

## 2014-06-22 MED ORDER — PRAVASTATIN SODIUM 40 MG PO TABS
80.0000 mg | ORAL_TABLET | Freq: Every morning | ORAL | Status: DC
  Administered 2014-06-23 – 2014-06-30 (×8): 80 mg via ORAL
  Filled 2014-06-22 (×6): qty 2
  Filled 2014-06-22: qty 1
  Filled 2014-06-22 (×2): qty 2
  Filled 2014-06-22: qty 1

## 2014-06-22 MED ORDER — SODIUM CHLORIDE 0.9 % IV SOLN
1.0000 g | Freq: Once | INTRAVENOUS | Status: AC
  Administered 2014-06-22: 1 g via INTRAVENOUS
  Filled 2014-06-22 (×2): qty 10

## 2014-06-22 MED ORDER — SODIUM CHLORIDE 0.9 % IV SOLN
1.0000 g | Freq: Once | INTRAVENOUS | Status: AC
  Administered 2014-06-22: 1 g via INTRAVENOUS
  Filled 2014-06-22: qty 10

## 2014-06-22 MED ORDER — CILOSTAZOL 100 MG PO TABS
50.0000 mg | ORAL_TABLET | Freq: Two times a day (BID) | ORAL | Status: DC
  Administered 2014-06-23 – 2014-06-30 (×15): 50 mg via ORAL
  Filled 2014-06-22: qty 1
  Filled 2014-06-22 (×7): qty 0.5
  Filled 2014-06-22: qty 1
  Filled 2014-06-22 (×2): qty 0.5
  Filled 2014-06-22 (×2): qty 1
  Filled 2014-06-22 (×7): qty 0.5
  Filled 2014-06-22: qty 1

## 2014-06-22 MED ORDER — ONDANSETRON HCL 4 MG PO TABS
4.0000 mg | ORAL_TABLET | Freq: Four times a day (QID) | ORAL | Status: DC | PRN
  Administered 2014-06-28: 4 mg via ORAL
  Filled 2014-06-22: qty 1

## 2014-06-22 MED ORDER — CILOSTAZOL 100 MG PO TABS
ORAL_TABLET | ORAL | Status: AC
  Filled 2014-06-22: qty 1

## 2014-06-22 MED ORDER — ENSURE COMPLETE PO LIQD
237.0000 mL | Freq: Two times a day (BID) | ORAL | Status: DC
  Administered 2014-06-23 – 2014-06-30 (×14): 237 mL via ORAL

## 2014-06-22 MED ORDER — ACETAMINOPHEN 650 MG RE SUPP: 650.0000 mg | Freq: Four times a day (QID) | RECTAL | Status: DC | PRN

## 2014-06-22 MED ORDER — SODIUM CHLORIDE 0.9 % IV SOLN
INTRAVENOUS | Status: DC
  Administered 2014-06-22 – 2014-06-25 (×8): via INTRAVENOUS

## 2014-06-22 MED ORDER — ONDANSETRON HCL 4 MG/2ML IJ SOLN
4.0000 mg | Freq: Four times a day (QID) | INTRAMUSCULAR | Status: DC | PRN
  Administered 2014-06-25: 4 mg via INTRAVENOUS
  Filled 2014-06-22: qty 2

## 2014-06-22 MED ORDER — SODIUM CHLORIDE 0.9 % IV BOLUS (SEPSIS)
1000.0000 mL | Freq: Once | INTRAVENOUS | Status: AC
  Administered 2014-06-22: 1000 mL via INTRAVENOUS

## 2014-06-22 MED ORDER — SODIUM CHLORIDE 0.9 % IV SOLN
Freq: Once | INTRAVENOUS | Status: AC
  Administered 2014-06-22: 12:00:00 via INTRAVENOUS

## 2014-06-22 MED ORDER — ACETAMINOPHEN 325 MG PO TABS
650.0000 mg | ORAL_TABLET | Freq: Four times a day (QID) | ORAL | Status: DC | PRN
  Administered 2014-06-22 – 2014-06-28 (×8): 650 mg via ORAL
  Filled 2014-06-22 (×8): qty 2

## 2014-06-22 MED ORDER — INSULIN ASPART 100 UNIT/ML ~~LOC~~ SOLN
0.0000 [IU] | Freq: Three times a day (TID) | SUBCUTANEOUS | Status: DC
  Administered 2014-06-23: 1 [IU] via SUBCUTANEOUS
  Administered 2014-06-23: 9 [IU] via SUBCUTANEOUS
  Administered 2014-06-24: 2 [IU] via SUBCUTANEOUS
  Administered 2014-06-24: 1 [IU] via SUBCUTANEOUS
  Administered 2014-06-24: 3 [IU] via SUBCUTANEOUS
  Administered 2014-06-26: 1 [IU] via SUBCUTANEOUS
  Administered 2014-06-27 (×2): 2 [IU] via SUBCUTANEOUS
  Administered 2014-06-28: 3 [IU] via SUBCUTANEOUS
  Administered 2014-06-28: 2 [IU] via SUBCUTANEOUS
  Administered 2014-06-28: 1 [IU] via SUBCUTANEOUS
  Administered 2014-06-29 (×3): 2 [IU] via SUBCUTANEOUS

## 2014-06-22 MED ORDER — PANTOPRAZOLE SODIUM 40 MG PO TBEC
40.0000 mg | DELAYED_RELEASE_TABLET | Freq: Every day | ORAL | Status: DC
  Administered 2014-06-22 – 2014-06-30 (×9): 40 mg via ORAL
  Filled 2014-06-22 (×9): qty 1

## 2014-06-22 NOTE — ED Notes (Signed)
Pt c/o n/v/d since Monday and generalized weakness x 3 days.

## 2014-06-22 NOTE — Progress Notes (Signed)
Called into room by patient. She stated that she thought her foley had been pulled out when she was moved up in the bed.  Checked foley, it is intact, correctly placed and secured to the upper left thigh. Currently draining yellow urine. No signs of movement or displacement.

## 2014-06-22 NOTE — Progress Notes (Signed)
CRITICAL VALUE ALERT  Critical value received:  Calcium 5.8  Date of notification:  06/22/2014  Time of notification:  1713  Critical value read back:Yes.    Nurse who received alert:  Sharyn Blitz, RN  MD notified (1st page):  Dr. Maryland Pink  Time of first page:  1715  MD notified (2nd page):  Time of second page:  Responding MD:  Dr. Maryland Pink  Time MD responded:  [phone]

## 2014-06-22 NOTE — H&P (Signed)
History and Physical  [name]:[id number] DOB: Nov 15, 1933 DOA: 06/22/2014  Referring physician: Hoover Brunette, ER physician PCP: Stephens Shire, MD   Chief Complaint: Nausea vomiting and diarrhea  HPI: Ariana Shea is a 81 y.o. female  With past mental history of hypertension and diabetes who has been in her overall good state of health when she started having over the past 2 days episodes of nausea and vomiting and what looked to be blood-tinged diarrhea. Patient felt very weak and so she came to the emergency room for further evaluation. In the emergency room, white blood cell count and hemoglobin were normal, however concerning was a BUN of 156 and creatinine of 8.91 with an anion gap of 24. 6 months ago, patient's BUN was 22 and creatinine of 1.18. Patient's other labs of note were a calcium level VI.2. EKG was unrevealing. Patient states she can still void. Hospitalists were called for further evaluation and admission.  Review of Systems:  Patient seen after arrival to floor. Pt complains of feeling very weak, nauseated, lightheaded when sitting up, generalized body aches and a mild headache.  She still able to make some urine.  Pt denies any vision changes, dysphagia, chest pain, palpitations, shortness of breath, wheeze, cough, hematuria, dysuria, constipation, focal extremity numbness or weakness.  Review of systems are otherwise negative  Past Medical History  Diagnosis Date  . Hypertension   . Diabetes mellitus   . 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  . Anemia   . Pure hypercholesterolemia   . GERD (gastroesophageal reflux disease)   . Generalized and unspecified atherosclerosis   . Pneumonia     hx  . UTI (lower urinary tract infection)     recent tx   . Swallowing difficulty     gets crumbs caught in throat-told may have to have esophagus streched  . Arthritis     HANDS  . Blood  transfusion without reported diagnosis   . Ulcer    Past Surgical History  Procedure Laterality Date  . Abdominal hysterectomy    . Cholecystectomy    . Bladder surgery      tuck  . Artery repair Bilateral     corotid artery surgery  . Carotid endarterectomy      bilateral CEA 2008; (L) 11/2006, (R) 01/2007 with reperfusion phenomenon (seizure) post-operaitvely  . Multiple extractions with alveoloplasty N/A 12/07/2013    Procedure: MULTIPLE EXTRACTION WITH ALVEOLOPLASTY;  Surgeon: Gae Bon, DDS;  Location: [facility];  Service: Oral Surgery;  Laterality: N/A;   Social History:  reports that she quit smoking about 5 years ago. Her smoking use included Cigarettes. She has a 56 pack-year smoking history. She has never used smokeless tobacco. She reports that she does not drink alcohol or use illicit drugs. Patient lives at home by herself & is able to participate in activities of daily living with use of a walker or cane  Allergies  Allergen Reactions  . Penicillins Rash  . Sulfonamide Derivatives Rash    Family History  Problem Relation Age of Onset  . Heart disease Father     Confirmed with patient  Prior to Admission medications   Medication Sig Start Date End Date Taking? Authorizing Provider  carvedilol (COREG) 25 MG tablet Take 25 mg by mouth 2 (two) times daily with a meal.   Yes Historical Provider, MD  cilostazol (PLETAL) 50 MG tablet Take  1 tablet (50 mg total) by mouth 2 (two) times daily. 11/09/13  Yes Lorretta Harp, MD  iron polysaccharides (FERREX 150) 150 MG capsule Take 150 mg by mouth 2 (two) times daily.    Yes Historical Provider, MD  lisinopril (PRINIVIL,ZESTRIL) 20 MG tablet Take 20 mg by mouth every morning.    Yes Historical Provider, MD  metFORMIN (GLUCOPHAGE) 1000 MG tablet Take 500 mg by mouth 4 (four) times daily.    Yes Historical Provider, MD  omeprazole (PRILOSEC) 20 MG capsule Take 20 mg by mouth every morning.    Yes Historical Provider, MD    pravastatin (PRAVACHOL) 80 MG tablet Take 80 mg by mouth every morning.    Yes Historical Provider, MD    Physical Exam: BP 102/54 mmHg  Pulse 82  Temp(Src) 97.2 F (36.2 C) (Rectal)  Resp 18  Ht 5\' 3"  (1.6 m)  Wt 53.978 kg (119 lb)  BMI 21.09 kg/m2  SpO2 99%  General:  Alert and oriented 3, very tired Eyes: Sclera nonicteric, extraocular movements are intact ENT: Normocephalic, atraumatic, mucous membranes are quite dry Neck: No JVD Cardiovascular: Regular rate and rhythm, S1-S2 Respiratory: Clear to auscultation bilaterally Abdomen: Soft, nontender, nondistended, positive bowel sounds Skin: No skin breaks, tears or lesions, very poor capillary refill Musculoskeletal: No clubbing or cyanosis, trace edema Psychiatric: Patient has appropriate, no evidence of psychoses Neurologic: No focal deficits           Labs on Admission:  Basic Metabolic Panel:  Recent Labs Lab 06/22/14 1215  NA 133*  K 4.1  CL 97  CO2 12*  GLUCOSE 115*  BUN 156*  CREATININE 8.91*  CALCIUM 6.2*   Liver Function Tests: No results for input(s): AST, ALT, ALKPHOS, BILITOT, PROT, ALBUMIN in the last 168 hours. No results for input(s): LIPASE, AMYLASE in the last 168 hours. No results for input(s): AMMONIA in the last 168 hours. CBC:  Recent Labs Lab 06/22/14 1215  WBC 7.2  NEUTROABS 6.2  HGB 11.0*  HCT 31.3*  MCV 86.0  PLT 161   Cardiac Enzymes: No results for input(s): CKTOTAL, CKMB, CKMBINDEX, TROPONINI in the last 168 hours.  BNP (last 3 results) No results for input(s): PROBNP in the last 8760 hours. CBG: No results for input(s): GLUCAP in the last 168 hours.  Radiological Exams on Admission: Dg Abd Acute W/chest  06/22/2014   CLINICAL DATA:  Vomiting, abdominal pain and fever.  EXAM: ACUTE ABDOMEN SERIES (ABDOMEN 2 VIEW & CHEST 1 VIEW)  COMPARISON:  Chest x-ray on 11/28/2013  FINDINGS: Stable chronic left pleural thickening versus small left pleural effusion. There is no  evidence of pulmonary edema, consolidation, pneumothorax, nodule or pleural fluid. The heart size is normal.  Abdominal films show no evidence acute bowel obstruction or ileus. No free air is identified. Clips are present related to prior cholecystectomy. Mild degenerative changes present of the spine.  IMPRESSION: No acute findings.   Electronically Signed   By: Aletta Edouard M.D.   On: 06/22/2014 13:45    EKG: Independently reviewed. Sinus rhythm with prolonged QT interval, similar to previous  Assessment/Plan Present on Admission:  . Hypertension: Holding antihypertensives. Suspected volume depletion, ACE inhibitor may have contributed to worsening renal function.  . DM (diabetes mellitus), type 2 with peripheral vascular complications: Check D9I. Sliding-scale only. Suspect patient's appetite will be very poor given uremia.  . ARF (acute renal failure): Principal problem. Likely from volume depletion from nausea and vomiting diarrhea plus being on  ACE inhibitor. Will aggressively hydrate. Check renal ultrasound to ensure that she does not have obstructive uropathy. Recheck labs in the morning. Consult nephrology if there is no improvement.  Marland Kitchen PVD (peripheral vascular disease): Continue Pletal.  Hypocalcemia: Secondary to gastric losses. We'll give some supplementation, follow labs, keep on telemetry  . GERD (gastroesophageal reflux disease): Continue PPI.  Marland Kitchen Pure hypercholesterolemia: Continue statin.  . Nausea vomiting and diarrhea: Suspect a gastroenteritis. Patient's white blood cell count normal. Continue to monitor closely.  Metabolic acidosis: Most likely from uremia. We'll check a lactic acid and salicylate level.  Consultants: None  Code Status: DO NOT RESUSCITATE as confirmed by patient  Family Communication: Daughter-in-law and granddaughter at the bedside   Disposition Plan: Telemetry bed given hypercalcemia. Home once renal function improved  Time spent: 35  minutes  [facility] Pager [phone]

## 2014-06-22 NOTE — Progress Notes (Signed)
Pt arrived to 300 unit room# 312 @ 1609 this shift via stretcher from ED with family at bedside.

## 2014-06-22 NOTE — ED Provider Notes (Signed)
CSN: [id number]     Arrival date & time 06/22/14  1123 History  This chart was scribed for Orlie Dakin, MD by Stephania Fragmin, ED Scribe. This patient was seen in room APA02/APA02 and the patient's care was started at 12:42 PM.    Chief Complaint  Patient presents with  . Fatigue    The history is provided by the patient. No language interpreter was used.     HPI Comments: Ariana Shea is a 81 y.o. female with a history of DM and hypertension who presents to the Emergency Department complaining of fatigue that began 3 days ago. Patient also complains of associated nausea, vomiting, diarrhea, loss of appetite, and subjective fever. Patient last had 2 episodes of blood-tinged diarrhea today and 3 episodes yesterday. Her last episode of vomiting was 2 days ago. She denies present vomiting. Last vomited 2 days ago. No nausea present she does admit to diminished appetite, subjective fever diffuse myalgias and arthralgias. No treatment prior to coming here .Marland Kitchen Nothing makes symptoms better or worse however nausea and vomiting have resolved She states she is presently cold and does not want to eat. Patient has a surgical history of hysterectomy and cholecystectomy. She denies recent antibiotic use, travel. Dr. Tollie Pizza is her PCP.  Past Medical History  Diagnosis Date  . Hypertension   . Diabetes mellitus   . 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  . Anemia   . Pure hypercholesterolemia   . GERD (gastroesophageal reflux disease)   . Generalized and unspecified atherosclerosis   . Pneumonia     hx  . UTI (lower urinary tract infection)     recent tx   . Swallowing difficulty     gets crumbs caught in throat-told may have to have esophagus streched  . Arthritis     HANDS  . Blood transfusion without reported diagnosis   . Ulcer    Past Surgical History  Procedure Laterality Date  . Abdominal hysterectomy    .  Cholecystectomy    . Bladder surgery      tuck  . Artery repair Bilateral     corotid artery surgery  . Carotid endarterectomy      bilateral CEA 2008; (L) 11/2006, (R) 01/2007 with reperfusion phenomenon (seizure) post-operaitvely  . Multiple extractions with alveoloplasty N/A 12/07/2013    Procedure: MULTIPLE EXTRACTION WITH ALVEOLOPLASTY;  Surgeon: Gae Bon, DDS;  Location: [facility];  Service: Oral Surgery;  Laterality: N/A;   Family History  Problem Relation Age of Onset  . Heart disease Father    History  Substance Use Topics  . Smoking status: Former Smoker -- 1.00 packs/day for 56 years    Types: Cigarettes    Quit date: 11/28/2008  . Smokeless tobacco: Never Used  . Alcohol Use: No     Comment: quit   OB History    No data available     Review of Systems  Constitutional: Positive for fever, appetite change and fatigue.  Gastrointestinal: Positive for vomiting, abdominal pain, diarrhea and blood in stool.  Musculoskeletal: Positive for myalgias and arthralgias.      Allergies  Penicillins and Sulfonamide derivatives  Home Medications   Prior to Admission medications   Medication Sig Start Date End Date Taking? Authorizing Provider  carvedilol (COREG) 25 MG tablet Take 25 mg by mouth 2 (two) times daily with a meal.   Yes Historical Provider, MD  cilostazol (PLETAL) 50 MG tablet Take 1 tablet (50 mg total) by mouth 2 (two) times daily. 11/09/13  Yes Lorretta Harp, MD  iron polysaccharides (FERREX 150) 150 MG capsule Take 150 mg by mouth 2 (two) times daily.    Yes Historical Provider, MD  lisinopril (PRINIVIL,ZESTRIL) 20 MG tablet Take 20 mg by mouth every morning.    Yes Historical Provider, MD  metFORMIN (GLUCOPHAGE) 1000 MG tablet Take 500 mg by mouth 4 (four) times daily.    Yes Historical Provider, MD  omeprazole (PRILOSEC) 20 MG capsule Take 20 mg by mouth every morning.    Yes Historical Provider, MD  pravastatin (PRAVACHOL) 80 MG tablet Take 80 mg by  mouth every morning.    Yes Historical Provider, MD  oxyCODONE-acetaminophen (PERCOCET) 5-325 MG per tablet Take 1-2 tablets by mouth every 4 (four) hours as needed for severe pain. Patient not taking: Reported on 06/22/2014 12/07/13   Gae Bon, DDS   BP 98/55 mmHg  Pulse 80  Temp(Src) 97.2 F (36.2 C) (Rectal)  Resp 18  Ht 5\' 3"  (1.6 m)  Wt 119 lb (53.978 kg)  BMI 21.09 kg/m2  SpO2 97% Physical Exam  ED Course  Procedures (including critical care time)  DIAGNOSTIC STUDIES: Oxygen Saturation is 97% on room air, normal by my interpretation.     Labs Review Labs Reviewed  CBC WITH DIFFERENTIAL - Abnormal; Notable for the following:    RBC 3.64 (*)    Hemoglobin 11.0 (*)    HCT 31.3 (*)    Neutrophils Relative % 85 (*)    Lymphocytes Relative 7 (*)    Lymphs Abs 0.5 (*)    All other components within normal limits  POC OCCULT BLOOD, ED - Abnormal; Notable for the following:    Fecal Occult Bld POSITIVE (*)    All other components within normal limits  BASIC METABOLIC PANEL  URINALYSIS, ROUTINE W REFLEX MICROSCOPIC    Imaging Review No results found.   EKG Interpretation   Date/Time:  Saturday June 22 2014 11:27:14 EST Ventricular Rate:  82 PR Interval:  205 QRS Duration: 89 QT Interval:  443 QTC Calculation: 517 R Axis:   24 Text Interpretation:  Sinus rhythm Low voltage, extremity and precordial  leads Probable anteroseptal infarct, old Prolonged QT interval No  significant change since last tracing Confirmed by Winfred Leeds  MD, Makaylee Spielberg  [phone]) on 06/22/2014 11:31:52 AM     1 amp calcium gluconate ordered to treat hypocalcemia Abdominal x-rays reviewed by me Results for orders placed or performed during the hospital encounter of 06/22/14  CBC with Differential  Result Value Ref Range   WBC 7.2 4.0 - 10.5 K/uL   RBC 3.64 (L) 3.87 - 5.11 MIL/uL   Hemoglobin 11.0 (L) 12.0 - 15.0 g/dL   HCT 31.3 (L) 36.0 - 46.0 %   MCV 86.0 78.0 - 100.0 fL   MCH 30.2 26.0  - 34.0 pg   MCHC 35.1 30.0 - 36.0 g/dL   RDW 13.0 11.5 - 15.5 %   Platelets 161 150 - 400 K/uL   Neutrophils Relative % 85 (H) 43 - 77 %   Neutro Abs 6.2 1.7 - 7.7 K/uL   Lymphocytes Relative 7 (L) 12 - 46 %   Lymphs Abs 0.5 (L) 0.7 - 4.0 K/uL   Monocytes Relative 8 3 - 12 %   Monocytes Absolute 0.6 0.1 - 1.0 K/uL   Eosinophils Relative 0 0 - 5 %   Eosinophils Absolute  0.0 0.0 - 0.7 K/uL   Basophils Relative 0 0 - 1 %   Basophils Absolute 0.0 0.0 - 0.1 K/uL  Basic metabolic panel  Result Value Ref Range   Sodium 133 (L) 135 - 145 mmol/L   Potassium 4.1 3.5 - 5.1 mmol/L   Chloride 97 96 - 112 mEq/L   CO2 12 (L) 19 - 32 mmol/L   Glucose, Bld 115 (H) 70 - 99 mg/dL   BUN 156 (H) 6 - 23 mg/dL   Creatinine, Ser 8.91 (H) 0.50 - 1.10 mg/dL   Calcium 6.2 (LL) 8.4 - 10.5 mg/dL   GFR calc non Af Amer 4 (L) >90 mL/min   GFR calc Af Amer 4 (L) >90 mL/min   Anion gap 24 (H) 5 - 15  Urinalysis, Routine w reflex microscopic  Result Value Ref Range   Color, Urine YELLOW YELLOW   APPearance CLOUDY (A) CLEAR   Specific Gravity, Urine 1.025 1.005 - 1.030   pH 5.5 5.0 - 8.0   Glucose, UA NEGATIVE NEGATIVE mg/dL   Hgb urine dipstick MODERATE (A) NEGATIVE   Bilirubin Urine NEGATIVE NEGATIVE   Ketones, ur NEGATIVE NEGATIVE mg/dL   Protein, ur 100 (A) NEGATIVE mg/dL   Urobilinogen, UA 0.2 0.0 - 1.0 mg/dL   Nitrite NEGATIVE NEGATIVE   Leukocytes, UA SMALL (A) NEGATIVE  Urine microscopic-add on  Result Value Ref Range   WBC, UA 7-10 <3 WBC/hpf   RBC / HPF 3-6 <3 RBC/hpf   Bacteria, UA MANY (A) RARE  POC occult blood, ED  Result Value Ref Range   Fecal Occult Bld POSITIVE (A) NEGATIVE   Dg Abd Acute W/chest  06/22/2014   CLINICAL DATA:  Vomiting, abdominal pain and fever.  EXAM: ACUTE ABDOMEN SERIES (ABDOMEN 2 VIEW & CHEST 1 VIEW)  COMPARISON:  Chest x-ray on 11/28/2013  FINDINGS: Stable chronic left pleural thickening versus small left pleural effusion. There is no evidence of pulmonary edema,  consolidation, pneumothorax, nodule or pleural fluid. The heart size is normal.  Abdominal films show no evidence acute bowel obstruction or ileus. No free air is identified. Clips are present related to prior cholecystectomy. Mild degenerative changes present of the spine.  IMPRESSION: No acute findings.   Electronically Signed   By: Aletta Edouard M.D.   On: 06/22/2014 13:45    MDM  Suspect that renal failure secondary to dehydration.urine sent for culture Final diagnoses:  None   Spoke with Dr.S. [place] Pink plan admit telemetry, intravenous hydration. Diagnosis #1 acute renal failure #2 metabolic acidosis #3 hypocalcemia        Orlie Dakin, MD 06/22/14 1427

## 2014-06-23 ENCOUNTER — Inpatient Hospital Stay (HOSPITAL_COMMUNITY): Payer: Medicare Other

## 2014-06-23 DIAGNOSIS — E872 Acidosis, unspecified: Secondary | ICD-10-CM | POA: Diagnosis present

## 2014-06-23 DIAGNOSIS — N390 Urinary tract infection, site not specified: Secondary | ICD-10-CM

## 2014-06-23 HISTORY — DX: Hypomagnesemia: E83.42

## 2014-06-23 LAB — BASIC METABOLIC PANEL
Anion gap: 21 — ABNORMAL HIGH (ref 5–15)
BUN: 122 mg/dL — AB (ref 6–23)
CO2: 10 mmol/L — CL (ref 19–32)
Calcium: 6.2 mg/dL — CL (ref 8.4–10.5)
Chloride: 106 mEq/L (ref 96–112)
Creatinine, Ser: 6.64 mg/dL — ABNORMAL HIGH (ref 0.50–1.10)
GFR calc non Af Amer: 5 mL/min — ABNORMAL LOW (ref >90)
GFR, EST AFRICAN AMERICAN: 6 mL/min — AB (ref >90)
Glucose, Bld: 89 mg/dL (ref 70–99)
Potassium: 3.4 mmol/L — ABNORMAL LOW (ref 3.5–5.1)
Sodium: 137 mmol/L (ref 135–145)

## 2014-06-23 LAB — CBC
HEMATOCRIT: 30.3 % — AB (ref 36.0–46.0)
Hemoglobin: 10.5 g/dL — ABNORMAL LOW (ref 12.0–15.0)
MCH: 29.8 pg (ref 26.0–34.0)
MCHC: 34.7 g/dL (ref 30.0–36.0)
MCV: 86.1 fL (ref 78.0–100.0)
Platelets: 150 10*3/uL (ref 150–400)
RBC: 3.52 MIL/uL — ABNORMAL LOW (ref 3.87–5.11)
RDW: 13.2 % (ref 11.5–15.5)
WBC: 6.1 10*3/uL (ref 4.0–10.5)

## 2014-06-23 LAB — MAGNESIUM: Magnesium: 1 mg/dL — ABNORMAL LOW (ref 1.5–2.5)

## 2014-06-23 LAB — GLUCOSE, CAPILLARY
GLUCOSE-CAPILLARY: 235 mg/dL — AB (ref 70–99)
Glucose-Capillary: 82 mg/dL (ref 70–99)
Glucose-Capillary: 141 mg/dL — ABNORMAL HIGH (ref 70–99)
Glucose-Capillary: 355 mg/dL — ABNORMAL HIGH (ref 70–99)

## 2014-06-23 LAB — HEMOGLOBIN A1C
Hgb A1c MFr Bld: 6.1 % — ABNORMAL HIGH (ref <5.7)
Mean Plasma Glucose: 128 mg/dL — ABNORMAL HIGH (ref <117)

## 2014-06-23 MED ORDER — SODIUM CHLORIDE 0.9 % IV SOLN
1.0000 g | Freq: Once | INTRAVENOUS | Status: AC
  Administered 2014-06-23: 1 g via INTRAVENOUS
  Filled 2014-06-23: qty 10

## 2014-06-23 MED ORDER — CIPROFLOXACIN IN D5W 200 MG/100ML IV SOLN
200.0000 mg | INTRAVENOUS | Status: DC
Start: 2014-06-23 — End: 2014-06-24
  Administered 2014-06-23: 200 mg via INTRAVENOUS
  Filled 2014-06-23 (×2): qty 100

## 2014-06-23 MED ORDER — MAGNESIUM SULFATE IN D5W 10-5 MG/ML-% IV SOLN: 1.0000 g | Freq: Once | INTRAVENOUS | Status: DC

## 2014-06-23 MED ORDER — MAGNESIUM SULFATE IN D5W 10-5 MG/ML-% IV SOLN
1.0000 g | Freq: Once | INTRAVENOUS | Status: AC
  Administered 2014-06-23: 1 g via INTRAVENOUS
  Filled 2014-06-23: qty 100

## 2014-06-23 NOTE — Progress Notes (Addendum)
PROGRESS NOTE  Ariana Shea PIR:[id number] DOB: 03-Nov-1933 DOA: 06/22/2014 PCP: Stephens Shire, MD  HPI/Recap of past 61 hours: 81 year old female with past medical history of diabetes mellitus and peripheral vascular disease had several days of nausea and vomiting and diarrhea prior to admission and came to the emergency room with creatinine of 12 with baseline normal. Felt to be all prerenal from volume loss and patient started on aggressive IV fluids.  By hospital day 2, patient started to feel little bit better. Complains of some right flank tenderness. Had low-grade temp so started on IV Rocephin for presumed UTI. Renal ultrasound noted mild right hydronephrosis. Creatinine down to 6  Assessment/Plan: Principal Problem:   ARF (acute renal failure): Improving with IV fluids, continue  -Metabolic acidosis: Lactic acid level and salicylate level normal on admission. This is from uremia. Slowly getting better. Active Problems:   Hypertension, holding antihypertensives until renal function resolved.    DM (diabetes mellitus), type 2 with peripheral vascular complications: CBG stable., A1c at 6.1    PVD (peripheral vascular disease): Continue on Pletal.    GERD (gastroesophageal reflux disease): Continue on PPI.    Pure hypercholesterolemia: Continue on statin.    Nausea vomiting and diarrhea: Likely gastroenteritis. No further diarrhea since before admission. Nausea now from uremia area    UTI (urinary tract infection): Presumed UTI. Awaiting cultures. Start IV Rocephin    Hypomagnesemia: Mag level checked, slightly low. 1 g mag sulfate.    Hypocalcemia: Secondary to volume losses. Replacing with amps of calcium gluconate   Code Status: DO NOT RESUSCITATE-  Family Communication: Spoke with family yesterday  Disposition Plan: Continue in-hospital until renal function normalized   Consultants:  None  Procedures:  None  Antibiotics:  IV Rocephin  1/3-present   Objective: BP 94/36 mmHg  Pulse 91  Temp(Src) 97.9 F (36.6 C) (Oral)  Resp 18  Ht 5\' 3"  (1.6 m)  Wt 53.978 kg (119 lb)  BMI 21.09 kg/m2  SpO2 94%  Intake/Output Summary (Last 24 hours) at 06/23/14 1417 Last data filed at 06/23/14 0900  Gross per 24 hour  Intake 469.17 ml  Output      4 ml  Net 465.17 ml   Filed Weights   06/22/14 1107  Weight: 53.978 kg (119 lb)    Exam:   General:  Alert and oriented 3, very fatigued  Cardiovascular: Regular rate and rhythm, S1-S2  Respiratory: Clear to auscultation bilaterally  Abdomen: Soft, nontender, nondistended, positive bowel sounds  Musculoskeletal: No clubbing or cyanosis, trace edema   Data Reviewed: Basic Metabolic Panel:  Recent Labs Lab 06/22/14 1215 06/22/14 1411 06/23/14 0603 06/23/14 0802  NA 133* 135 137  --   K 4.1 4.1 3.4*  --   CL 97 100 106  --   CO2 12* 11* 10*  --   GLUCOSE 115* 107* 89  --   BUN 156* 147* 122*  --   CREATININE 8.91* 8.54* 6.64*  --   CALCIUM 6.2* 5.8* 6.2*  --   MG  --   --   --  1.0*   Liver Function Tests:  Recent Labs Lab 06/22/14 1411  AST 15  ALT 17  ALKPHOS 54  BILITOT 0.7  PROT 5.2*  ALBUMIN 2.7*   No results for input(s): LIPASE, AMYLASE in the last 168 hours. No results for input(s): AMMONIA in the last 168 hours. CBC:  Recent Labs Lab 06/22/14 1215 06/23/14 0603  WBC 7.2 6.1  NEUTROABS 6.2  --  HGB 11.0* 10.5*  HCT 31.3* 30.3*  MCV 86.0 86.1  PLT 161 150   Cardiac Enzymes:   No results for input(s): CKTOTAL, CKMB, CKMBINDEX, TROPONINI in the last 168 hours. BNP (last 3 results) No results for input(s): PROBNP in the last 8760 hours. CBG:  Recent Labs Lab 06/22/14 1846 06/23/14 0818 06/23/14 1148  GLUCAP 115* 82 141*    No results found for this or any previous visit (from the past 240 hour(s)).   Studies: Dg Abd Acute W/chest  06/22/2014   CLINICAL DATA:  Vomiting, abdominal pain and fever.  EXAM: ACUTE ABDOMEN  SERIES (ABDOMEN 2 VIEW & CHEST 1 VIEW)  COMPARISON:  Chest x-ray on 11/28/2013  FINDINGS: Stable chronic left pleural thickening versus small left pleural effusion. There is no evidence of pulmonary edema, consolidation, pneumothorax, nodule or pleural fluid. The heart size is normal.  Abdominal films show no evidence acute bowel obstruction or ileus. No free air is identified. Clips are present related to prior cholecystectomy. Mild degenerative changes present of the spine.  IMPRESSION: No acute findings.   Electronically Signed   By: Aletta Edouard M.D.   On: 06/22/2014 13:45    Scheduled Meds: . cilostazol  50 mg Oral BID  . ciprofloxacin  200 mg Intravenous Q24H  . feeding supplement (ENSURE COMPLETE)  237 mL Oral BID BM  . insulin aspart  0-9 Units Subcutaneous TID WC  . pantoprazole  40 mg Oral Daily  . pravastatin  80 mg Oral q morning - 10a    Continuous Infusions: . sodium chloride 125 mL/hr at 06/23/14 1014     Time spent: -25 minutes  [facility] Pager [phone]. If 7PM-7AM, please contact night-coverage at [email or web address], password [facility] 06/23/2014, 2:17 PM  LOS: 1 day

## 2014-06-23 NOTE — Progress Notes (Signed)
0720 Ca+ 6.2, CO2 10, K+ 3.4 this morning lab results, MD notified.

## 2014-06-23 NOTE — Progress Notes (Signed)
Utilization review Completed Zaivion Kundrat RN BSN   

## 2014-06-24 DIAGNOSIS — E872 Acidosis: Secondary | ICD-10-CM

## 2014-06-24 LAB — GLUCOSE, CAPILLARY
GLUCOSE-CAPILLARY: 232 mg/dL — AB (ref 70–99)
Glucose-Capillary: 174 mg/dL — ABNORMAL HIGH (ref 70–99)
Glucose-Capillary: 127 mg/dL — ABNORMAL HIGH (ref 70–99)
Glucose-Capillary: 111 mg/dL — ABNORMAL HIGH (ref 70–99)

## 2014-06-24 LAB — CBC
HCT: 29.4 % — ABNORMAL LOW (ref 36.0–46.0)
Hemoglobin: 10.4 g/dL — ABNORMAL LOW (ref 12.0–15.0)
MCH: 29.6 pg (ref 26.0–34.0)
MCHC: 35.4 g/dL (ref 30.0–36.0)
MCV: 83.8 fL (ref 78.0–100.0)
Platelets: 155 10*3/uL (ref 150–400)
RBC: 3.51 MIL/uL — ABNORMAL LOW (ref 3.87–5.11)
RDW: 13.2 % (ref 11.5–15.5)
WBC: 11 10*3/uL — ABNORMAL HIGH (ref 4.0–10.5)

## 2014-06-24 LAB — BASIC METABOLIC PANEL
Anion gap: 10 (ref 5–15)
BUN: 98 mg/dL — ABNORMAL HIGH (ref 6–23)
CO2: 17 mmol/L — ABNORMAL LOW (ref 19–32)
Calcium: 6.2 mg/dL — CL (ref 8.4–10.5)
Chloride: 110 mEq/L (ref 96–112)
Creatinine, Ser: 3.34 mg/dL — ABNORMAL HIGH (ref 0.50–1.10)
GFR calc Af Amer: 14 mL/min — ABNORMAL LOW (ref >90)
GFR calc non Af Amer: 12 mL/min — ABNORMAL LOW (ref >90)
Glucose, Bld: 197 mg/dL — ABNORMAL HIGH (ref 70–99)
Potassium: 2.6 mmol/L — CL (ref 3.5–5.1)
Sodium: 137 mmol/L (ref 135–145)

## 2014-06-24 MED ORDER — CIPROFLOXACIN IN D5W 400 MG/200ML IV SOLN
400.0000 mg | INTRAVENOUS | Status: DC
  Administered 2014-06-24: 400 mg via INTRAVENOUS
  Filled 2014-06-24: qty 200

## 2014-06-24 MED ORDER — POTASSIUM CHLORIDE CRYS ER 20 MEQ PO TBCR
40.0000 meq | EXTENDED_RELEASE_TABLET | Freq: Once | ORAL | Status: AC
  Administered 2014-06-24: 40 meq via ORAL
  Filled 2014-06-24: qty 2

## 2014-06-24 MED ORDER — SODIUM CHLORIDE 0.9 % IV SOLN
1.0000 g | Freq: Once | INTRAVENOUS | Status: AC
  Administered 2014-06-24: 1 g via INTRAVENOUS
  Filled 2014-06-24: qty 10

## 2014-06-24 NOTE — Care Management Note (Addendum)
    Page 1 of 1   06/28/2014     9:09:26 AM CARE MANAGEMENT NOTE 06/28/2014  Patient:  VALORI, HOLLENKAMP   Account Number:  [id number]  Date Initiated:  06/24/2014  Documentation initiated by:  Jolene Provost  Subjective/Objective Assessment:   Pt admitted with AKI. Pt is previously independent with ADL's. Pt lives alone but has son, daughter and grandson that check in often. Pt has cane and walker but hasnt needed to use them.     Action/Plan:   Pt has no HH services or med needs prior to admission. Pt plans to discharge home wtih self care. PT eval pending. Will continue to follow for CM needs.   Anticipated DC Date:  06/26/2014   Anticipated DC Plan:  [place]  CM consult      Choice offered to / List presented to:             Status of service:  Completed, signed off Medicare Important Message given?  YES (If response is "NO", the following Medicare IM given date fields will be blank) Date Medicare IM given:  06/28/2014 Medicare IM given by:  Jolene Provost Date Additional Medicare IM given:   Additional Medicare IM given by:    Discharge Disposition:  [facility]  Per UR Regulation:    If discussed at Long Length of Stay Meetings, dates discussed:   06/27/2014    Comments:  06/28/2014 0900 Jolene Provost, RN, MSN, PCCN  06/27/2014 [place], RN, MSN, PCCN Pt plans to discharge to [facility], per pt's recommendations. CSW aware of discharge plan and has arranged for SNF placement. No further CM needs identified. Plans for dishcarge to SNF on 1/8.  06/24/2014 [place], RN, MSN, [facility]

## 2014-06-24 NOTE — Progress Notes (Signed)
Inpatient Diabetes Program Recommendations  AACE/ADA: New Consensus Statement on Inpatient Glycemic Control (2013)  Target Ranges:  Prepandial:   less than 140 mg/dL      Peak postprandial:   less than 180 mg/dL (1-2 hours)      Critically ill patients:  140 - 180 mg/dL   Reason for Visit: Elevated blood sugars CBGs-141, 355, 235, 174, 232 Diabetes history: Type II Outpatient Diabetes medications: Metformin 500 mg 4x/day Current orders for Inpatient glycemic control: Sensitive scale Correction tid Recommendation:  If blood sugars continued to be elevated, consider increasing the correction scale and changing the Ensure to Glucerna.  When pt is eating greater than 50% consider discontinuing the supplement. [place], CDE. M.Ed. Pager [phone] Inpatient Diabetes Coordinator

## 2014-06-24 NOTE — Progress Notes (Signed)
PROGRESS NOTE  Ariana Shea PJA:[id number] DOB: Oct 21, 1933 DOA: 06/22/2014 PCP: Stephens Shire, MD  HPI/Recap of past 56 hours: 81 year old female with past medical history of diabetes mellitus and peripheral vascular disease had several days of nausea and vomiting and diarrhea prior to admission and came to the emergency room with creatinine of 12 with baseline normal. Felt to be all prerenal from volume loss and patient started on aggressive IV fluids.  By hospital day 2, patient started to feel little bit better. Complains of some right flank tenderness. Had low-grade temp so started on IV Rocephin for presumed UTI. Renal ultrasound noted mild right hydronephrosis.   Patient continues to improve. Creatinine down to 3.3 today and she is afebrile since starting antibiotics.  Assessment/Plan: Principal Problem:   ARF (acute renal failure): Improving with IV fluids, continue  -Metabolic acidosis: Lactic acid level and salicylate level normal on admission. This is from uremia. Slowly getting better. Active Problems:   Hypertension, holding antihypertensives until renal function resolved.    DM (diabetes mellitus), type 2 with peripheral vascular complications: CBG stable., A1c at 6.1    PVD (peripheral vascular disease): Continue on Pletal.    GERD (gastroesophageal reflux disease): Continue on PPI.    Pure hypercholesterolemia: Continue on statin.    Nausea vomiting and diarrhea: Likely gastroenteritis. No further diarrhea since before admission. Nausea now from uremia area    UTI (urinary tract infection): Presumed UTI. Urine cultures grew out gram-negative rods. Start IV Rocephin    Hypomagnesemia: Mag level checked, slightly low. 1 g mag sulfate.    Hypocalcemia: Secondary to volume losses. Replacing with amps of calcium gluconate   Code Status: DO NOT RESUSCITATE-  Family Communication: No family present  Disposition Plan: Continue in-hospital until renal function  normalized   Consultants:  None  Procedures:  None  Antibiotics:  IV Rocephin 1/3-present   Objective: BP 126/42 mmHg  Pulse 118  Temp(Src) 99.1 F (37.3 C) (Oral)  Resp 20  Ht 5\' 3"  (1.6 m)  Wt 53.978 kg (119 lb)  BMI 21.09 kg/m2  SpO2 95%  Intake/Output Summary (Last 24 hours) at 06/24/14 1555 Last data filed at 06/24/14 0705  Gross per 24 hour  Intake 1985.42 ml  Output   3501 ml  Net -1515.58 ml   Filed Weights   06/22/14 1107  Weight: 53.978 kg (119 lb)    Exam:   General:  Somnolent, very fatigued  Cardiovascular: Regular rate and rhythm, S1-S2  Respiratory: Clear to auscultation bilaterally  Abdomen: Soft, nontender, nondistended, positive bowel sounds, mild right flank tenderness  Musculoskeletal: No clubbing or cyanosis, trace edema   Data Reviewed: Basic Metabolic Panel:  Recent Labs Lab 06/22/14 1215 06/22/14 1411 06/23/14 0603 06/23/14 0802 06/24/14 0623  NA 133* 135 137  --  137  K 4.1 4.1 3.4*  --  2.6*  CL 97 100 106  --  110  CO2 12* 11* 10*  --  17*  GLUCOSE 115* 107* 89  --  197*  BUN 156* 147* 122*  --  98*  CREATININE 8.91* 8.54* 6.64*  --  3.34*  CALCIUM 6.2* 5.8* 6.2*  --  6.2*  MG  --   --   --  1.0*  --    Liver Function Tests:  Recent Labs Lab 06/22/14 1411  AST 15  ALT 17  ALKPHOS 54  BILITOT 0.7  PROT 5.2*  ALBUMIN 2.7*   No results for input(s): LIPASE, AMYLASE in the last 168  hours. No results for input(s): AMMONIA in the last 168 hours. CBC:  Recent Labs Lab 06/22/14 1215 06/23/14 0603 06/24/14 0623  WBC 7.2 6.1 11.0*  NEUTROABS 6.2  --   --   HGB 11.0* 10.5* 10.4*  HCT 31.3* 30.3* 29.4*  MCV 86.0 86.1 83.8  PLT 161 150 155   Cardiac Enzymes:   No results for input(s): CKTOTAL, CKMB, CKMBINDEX, TROPONINI in the last 168 hours. BNP (last 3 results) No results for input(s): PROBNP in the last 8760 hours. CBG:  Recent Labs Lab 06/23/14 1148 06/23/14 1702 06/23/14 2157  06/24/14 0727 06/24/14 1131  GLUCAP 141* 355* 235* 174* 232*    Recent Results (from the past 240 hour(s))  Urine culture     Status: None (Preliminary result)   Collection Time: 06/22/14  1:06 PM  Result Value Ref Range Status   Specimen Description URINE, CATHETERIZED  Final   Special Requests Immunocompromised  Final   [place] Count   Final    >=100,000 COLONIES/ML Performed at [facility]    Culture   Final    [place] Performed at [facility]    Report Status PENDING  Incomplete     Studies: US Renal  06/23/2014   CLINICAL DATA:  Acute renal failure, hypertension, diabetes  EXAM: RENAL/URINARY TRACT ULTRASOUND COMPLETE  COMPARISON:  [facility]  FINDINGS: Right Kidney:  Length: 13 cm. Mild hydronephrosis. Echogenicity within normal limits. No mass visualized.  Left Kidney:  Length: 14.1 cm. Echogenicity within normal limits. No mass or hydronephrosis visualized.  Bladder:  Decompressed by Foley catheter  IMPRESSION: 1. Mild right hydronephrosis.   Electronically Signed   By: Arne Cleveland M.D.   On: 06/23/2014 10:17    Scheduled Meds: . cilostazol  50 mg Oral BID  . ciprofloxacin  400 mg Intravenous Q24H  . feeding supplement (ENSURE COMPLETE)  237 mL Oral BID BM  . insulin aspart  0-9 Units Subcutaneous TID WC  . pantoprazole  40 mg Oral Daily  . pravastatin  80 mg Oral q morning - 10a    Continuous Infusions: . sodium chloride 125 mL/hr at 06/24/14 2902     Time spent: 15 minutes  [facility] Pager [phone]. If 7PM-7AM, please contact night-coverage at [email or web address], password [facility] 06/24/2014, 3:55 PM  LOS: 2 days

## 2014-06-24 NOTE — Progress Notes (Signed)
CRITICAL VALUE ALERT  Critical value received:  Potassium   Date of notification:  06/24/2014  Time of notification:  0730  Critical value read back:Yes.    Nurse who received alert:  Tacey Heap RN  MD notified (1st page):  Lyman Speller  Time of first page:  0750  MD notified (2nd page):  Time of second page:  Responding MD:    Time MD responded:

## 2014-06-25 ENCOUNTER — Inpatient Hospital Stay (HOSPITAL_COMMUNITY): Payer: Medicare Other

## 2014-06-25 LAB — BASIC METABOLIC PANEL
Anion gap: 10 (ref 5–15)
BUN: 67 mg/dL — AB (ref 6–23)
CHLORIDE: 117 meq/L — AB (ref 96–112)
CO2: 16 mmol/L — ABNORMAL LOW (ref 19–32)
Calcium: 5.9 mg/dL — CL (ref 8.4–10.5)
Creatinine, Ser: 2.05 mg/dL — ABNORMAL HIGH (ref 0.50–1.10)
GFR, EST AFRICAN AMERICAN: 26 mL/min — AB (ref >90)
GFR, EST NON AFRICAN AMERICAN: 22 mL/min — AB (ref >90)
GLUCOSE: 117 mg/dL — AB (ref 70–99)
Potassium: 2.6 mmol/L — CL (ref 3.5–5.1)
Sodium: 143 mmol/L (ref 135–145)

## 2014-06-25 LAB — CBC
HCT: 27.1 % — ABNORMAL LOW (ref 36.0–46.0)
HEMOGLOBIN: 9.6 g/dL — AB (ref 12.0–15.0)
MCH: 29.9 pg (ref 26.0–34.0)
MCHC: 35.4 g/dL (ref 30.0–36.0)
MCV: 84.4 fL (ref 78.0–100.0)
Platelets: 136 10*3/uL — ABNORMAL LOW (ref 150–400)
RBC: 3.21 MIL/uL — ABNORMAL LOW (ref 3.87–5.11)
RDW: 13.5 % (ref 11.5–15.5)
WBC: 13.4 10*3/uL — AB (ref 4.0–10.5)

## 2014-06-25 LAB — URINE CULTURE: Colony Count: >=100000

## 2014-06-25 LAB — GLUCOSE, CAPILLARY
GLUCOSE-CAPILLARY: 96 mg/dL (ref 70–99)
GLUCOSE-CAPILLARY: 114 mg/dL — AB (ref 70–99)
Glucose-Capillary: 102 mg/dL — ABNORMAL HIGH (ref 70–99)
Glucose-Capillary: 94 mg/dL (ref 70–99)

## 2014-06-25 LAB — MAGNESIUM: MAGNESIUM: 0.9 mg/dL — AB (ref 1.5–2.5)

## 2014-06-25 MED ORDER — POTASSIUM CHLORIDE CRYS ER 20 MEQ PO TBCR
40.0000 meq | EXTENDED_RELEASE_TABLET | Freq: Two times a day (BID) | ORAL | Status: AC
  Administered 2014-06-25 (×2): 40 meq via ORAL
  Filled 2014-06-25 (×2): qty 2

## 2014-06-25 MED ORDER — SODIUM CHLORIDE 0.9 % IV SOLN
1.0000 g | Freq: Two times a day (BID) | INTRAVENOUS | Status: AC
  Administered 2014-06-25 (×2): 1 g via INTRAVENOUS
  Filled 2014-06-25 (×2): qty 10

## 2014-06-25 MED ORDER — SODIUM CHLORIDE 0.45 % IV SOLN
INTRAVENOUS | Status: DC
  Administered 2014-06-25 – 2014-06-26 (×2): via INTRAVENOUS

## 2014-06-25 MED ORDER — CIPROFLOXACIN HCL 250 MG PO TABS
500.0000 mg | ORAL_TABLET | ORAL | Status: DC
  Administered 2014-06-25 – 2014-06-28 (×5): 500 mg via ORAL
  Filled 2014-06-25 (×5): qty 2

## 2014-06-25 MED ORDER — MAGNESIUM SULFATE 2 GM/50ML IV SOLN
2.0000 g | Freq: Once | INTRAVENOUS | Status: AC
  Administered 2014-06-25: 2 g via INTRAVENOUS
  Filled 2014-06-25: qty 50

## 2014-06-25 NOTE — Clinical Social Work Placement (Signed)
Clinical Social Work Department CLINICAL SOCIAL WORK PLACEMENT NOTE 06/25/2014  Patient:  CLYDIA, NIEVES  Account Number:  [id number] Admit date:  06/22/2014  Clinical Social Worker:  Benay Pike, LCSW  Date/time:  06/25/2014 10:56 AM  Clinical Social Work is seeking post-discharge placement for this patient at the following level of care:   [place]   (*CSW will update this form in Epic as items are completed)   06/25/2014  Patient/family provided with [facility] Department of Clinical Social Work's list of facilities offering this level of care within the geographic area requested by the patient (or if unable, by the patient's family).  06/25/2014  Patient/family informed of their freedom to choose among providers that offer the needed level of care, that participate in Medicare, Medicaid or managed care program needed by the patient, have an available bed and are willing to accept the patient.  06/25/2014  Patient/family informed of [facility]' ownership interest in [facility], as well as of the fact that they are under no obligation to receive care at this facility.  PASARR submitted to EDS on 06/25/2014 PASARR number received on 06/25/2014  FL2 transmitted to all facilities in geographic area requested by pt/family on  06/25/2014 FL2 transmitted to all facilities within larger geographic area on   Patient informed that his/her managed care company has contracts with or will negotiate with  certain facilities, including the following:     Patient/family informed of bed offers received:   Patient chooses bed at  Physician recommends and patient chooses bed at    Patient to be transferred to  on   Patient to be transferred to facility by  Patient and family notified of transfer on  Name of family member notified:    The following physician request were entered in Epic:   Additional Comments:  Benay Pike, [place]

## 2014-06-25 NOTE — Evaluation (Signed)
Physical Therapy Evaluation Patient Details Name: Ariana Shea MRN: [id number] DOB: Dec 01, 1933 Today's Date: 06/25/2014   History of Present Illness  With past mental history of hypertension and diabetes who has been in her overall good state of health when she started having over the past 2 days episodes of nausea and vomiting and what looked to be blood-tinged diarrhea. Patient felt very weak and so she came to the emergency room for further evaluation. In the emergency room, white blood cell count and hemoglobin were normal, however concerning was a BUN of 156 and creatinine of 8.91 with an anion gap of 24. 6 months ago, patient's BUN was 22 and creatinine of 1.18. Patient's other labs of note were a calcium level VI.2. EKG was unrevealing. Patient states she can still void. Hospitalists were called for further evaluation and admission.  Pt lives alone in an apartment and is normally independent with no assistive device.  Clinical Impression  Pt was seen for evaluation.  She reported to be exhausted and has not been out of the bed since admission.  She was found to have generalized weakness with significant deconditioning.  She required assist to transfer to EOB and was unable to walk.  She was able to transfer bed to chair using a walker and min assist.  I am strongly recommending SNF at d/c and pt is agreeable to this.    Follow Up Recommendations SNF    Equipment Recommendations  None recommended by PT    Recommendations for Other Services   OT    Precautions / Restrictions Precautions Precautions: Fall Restrictions Weight Bearing Restrictions: No      Mobility  Bed Mobility Overal bed mobility: Needs Assistance Bed Mobility: Supine to Sit     Supine to sit: Min guard     General bed mobility comments: mobility is very labored in the bed  Transfers Overall transfer level: Needs assistance Equipment used: Rolling walker (2 wheeled) Transfers: Sit to/from [employer] Sit to Stand: Min assist;From elevated surface Stand pivot transfers: Min guard       General transfer comment: pt instructed in using UEs to assist in standing...she used a walker to transfer bed to chair and was so deconditioned that she was unable to ambulate  Ambulation/Gait    unable              Stairs            Wheelchair Mobility    Modified Rankin (Stroke Patients Only)       Balance Overall balance assessment: No apparent balance deficits (not formally assessed)                                           Pertinent Vitals/Pain Pain Assessment: No/denies pain    Home Living Family/patient expects to be discharged to:: Skilled nursing facility                      Prior Function Level of Independence: Independent               Hand Dominance   Dominant Hand: Right    Extremity/Trunk Assessment   Upper Extremity Assessment: Defer to OT evaluation           Lower Extremity Assessment: Generalized weakness      Cervical / Trunk Assessment: Kyphotic  Communication  Communication: No difficulties  Cognition Arousal/Alertness: Awake/alert Behavior During Therapy: WFL for tasks assessed/performed Overall Cognitive Status: Within Functional Limits for tasks assessed                      General Comments      Exercises General Exercises - Lower Extremity Ankle Circles/Pumps: AROM;Both;10 reps;Supine Quad Sets: AROM;Both;Supine;5 reps Heel Slides: AAROM;Both;5 reps;Supine Hip ABduction/ADduction: AAROM;Both;5 reps;Supine      Assessment/Plan    PT Assessment Patient needs continued PT services  PT Diagnosis Difficulty walking;Generalized weakness   PT Problem List Decreased strength;Decreased activity tolerance;Decreased mobility  PT Treatment Interventions Gait training;Functional mobility training;Therapeutic exercise   PT Goals (Current goals can be found in the Care Plan  section) Acute Rehab PT Goals Patient Stated Goal: wants to be able to return home alone PT Goal Formulation: With patient Time For Goal Achievement: 07/09/14 Potential to Achieve Goals: Good    Frequency Min 3X/week   Barriers to discharge Decreased caregiver support pt lives alone    Co-evaluation               End of Session Equipment Utilized During Treatment: Gait belt Activity Tolerance: Patient limited by fatigue Patient left: in chair;with call bell/phone within reach;with chair alarm set Nurse Communication: Mobility status         Time: 4403-4742 PT Time Calculation (min) (ACUTE ONLY): 40 min   Charges:   PT Evaluation $Initial PT Evaluation Tier I: 1 Procedure     PT G CodesDemetrios Isaacs L 06/25/2014, 10:25 AM

## 2014-06-25 NOTE — Clinical Social Work Psychosocial (Signed)
Clinical Social Work Department BRIEF PSYCHOSOCIAL ASSESSMENT 06/25/2014  Patient:  Ariana Shea, Ariana Shea     Account Number:  [id number]     Admit date:  06/22/2014  Clinical Social Worker:  Ariana Shea  Date/Time:  06/25/2014 10:57 AM  Referred by:  CSW  Date Referred:  06/25/2014 Referred for  SNF Placement   Other Referral:   Interview type:  Patient Other interview type:    PSYCHOSOCIAL DATA Living Status:  ALONE Admitted from facility:   Level of care:   Primary support name:  John Primary support relationship to patient:  CHILD, ADULT Degree of support available:   supportive per pt    CURRENT CONCERNS Current Concerns  Post-Acute Placement   Other Concerns:    SOCIAL WORK ASSESSMENT / PLAN CSW met with pt at bedside. Pt alert and oriented and reports that she continues to have nausea. She came to ED with nausea, vomiting, and diarrhea. Admitted with possible gastroenteritis and acute renal failure. Pt states she lives alone in an apartment and does very well independently. She has three children, but only her youngest son, Jenny Reichmann is very involved and supportive. Pt said she no longer drives, but her neighbors take her to the store and to appointments. PT evaluated pt today and recommendation is for SNF. CSW discussed placement process and provided SNF list. Pt requests to go to [facility] as she lives in [place]. She states, "I thought I could go home" but is willing to follow current recommendations.   Assessment/plan status:  Psychosocial Support/Ongoing Assessment of Needs Other assessment/ plan:   Information/referral to community resources:   SNF list    PATIENT'S/FAMILY'S RESPONSE TO PLAN OF CARE: Pt open to short term SNF prior to return home. CSW will initiate bed search and follow up with pt.       Benay Pike, [place]

## 2014-06-25 NOTE — Progress Notes (Signed)
Critical values reported at 06/25/2014 at 7:30am by lab.   Potassium: 2.6 Calcium: 5.9 Magnesium: 0.9  Reported to Dr. Maryland Pink at [place] 06/25/2014.  Orders pending. Read back and verified.

## 2014-06-25 NOTE — Progress Notes (Signed)
PROGRESS NOTE  Ariana Shea KZL:[id number] DOB: [birth year] DOA: 06/22/2014 PCP: Stephens Shire, MD  HPI/Recap of past 41 hours: 81 year old female with past medical history of diabetes mellitus and peripheral vascular disease had several days of nausea and vomiting and diarrhea prior to admission and came to the emergency room with creatinine of 12 with baseline normal. Felt to be all prerenal from volume loss and patient started on aggressive IV fluids.  By hospital day 2, patient started to feel little bit better. Complains of some right flank tenderness. Had low-grade temp so started on IV Cipro for UTI which ended up growing pansensitive Klebsiella pneumoniae. Renal ultrasound noted mild right hydronephrosis.  Patient has responded very well to IV fluids and over past few days, creatinine has continued to improve and is currently down to 2.  Today, patient quite fatigued. Seen by PT who recommended short-term skilled nursing which is taking some convincing as patient is not very happy about that. White blood cell count has been trending up over the past 2 days, despite urine cultures being pansensitive. Has been afebrile.  Assessment/Plan: Principal Problem:   ARF (acute renal failure): Improving with IV fluids.  Noted mild hypernatremia, so have changed normal saline today to half normal saline at 100 cc an hour.  Am trying to be weary of volume overload. Patient has been on 7 pounds since admission, although difficult to say how much of that was underweight from volume loss prior to admission.  -Metabolic acidosis: Lactic acid level and salicylate level normal on admission. This is from uremia. Resolved.  Active Problems:   Hypertension, holding antihypertensives until renal function resolved.  Leukocytosis: Because of hydronephrosis and UTI and complaints of initially flank pain-no longer complaining, we'll go ahead and check CT without contrast looking for renal stones. If none to be  found, leukocytosis may be from stress margination from getting volume overloaded from fluids. If that is the case, would stop IV fluids as of tomorrow, 1/6 and start gentle diuresis    DM (diabetes mellitus), type 2 with peripheral vascular complications: CBG stable., A1c at 6.1    PVD (peripheral vascular disease): Continue on Pletal.    GERD (gastroesophageal reflux disease): Continue on PPI.    Pure hypercholesterolemia: Continue on statin.    Nausea vomiting and diarrhea: Likely gastroenteritis. No further diarrhea since before admission. Had some nausea on admission from uremia, that has since resolved    UTI (urinary tract infection): Presumed UTI. Urine cultures grew out pansensitive Klebsiella. Initially on IV Cipro, renally dosed (patient with penicillin allergy) and today to by mouth    Hypomagnesemia: Have been replacing, likely secondary to diuresis and GI losses earlier    Hypocalcemia: Secondary to volume losses. Replacing with amps of calcium gluconate   Code Status: DO NOT RESUSCITATE-  Family Communication: No family present  Disposition Plan: Continue in-hospital until renal function normalized   Consultants:  None  Procedures:  None  Antibiotics:  IV Rocephin 1/3-present   Objective: BP 140/61 mmHg  Pulse 84  Temp(Src) 96.4 F (35.8 C) (Oral)  Resp 20  Ht 5\' 3"  (1.6 m)  Wt 57.38 kg (126 lb 8 oz)  BMI 22.41 kg/m2  SpO2 95%  Intake/Output Summary (Last 24 hours) at 06/25/14 1515 Last data filed at 06/25/14 1308  Gross per 24 hour  Intake    780 ml  Output   3650 ml  Net  -2870 ml   Filed Weights   06/22/14 1107 06/25/14 1308  Weight: 53.978 kg (119 lb) 57.38 kg (126 lb 8 oz)    Exam:   General:  Fatigued, otherwise no acute distress  Cardiovascular: Regular rate and rhythm, S1-S2  Respiratory: Clear to auscultation bilaterally  Abdomen: Soft, nontender, nondistended, positive bowel sounds, mild right flank  tenderness  Musculoskeletal: No clubbing or cyanosis, trace edema   Data Reviewed: Basic Metabolic Panel:  Recent Labs Lab 06/22/14 1215 06/22/14 1411 06/23/14 0603 06/23/14 0802 06/24/14 0623 06/25/14 0623  NA 133* 135 137  --  137 143  K 4.1 4.1 3.4*  --  2.6* 2.6*  CL 97 100 106  --  110 117*  CO2 12* 11* 10*  --  17* 16*  GLUCOSE 115* 107* 89  --  197* 117*  BUN 156* 147* 122*  --  98* 67*  CREATININE 8.91* 8.54* 6.64*  --  3.34* 2.05*  CALCIUM 6.2* 5.8* 6.2*  --  6.2* 5.9*  MG  --   --   --  1.0*  --  0.9*   Liver Function Tests:  Recent Labs Lab 06/22/14 1411  AST 15  ALT 17  ALKPHOS 54  BILITOT 0.7  PROT 5.2*  ALBUMIN 2.7*   No results for input(s): LIPASE, AMYLASE in the last 168 hours. No results for input(s): AMMONIA in the last 168 hours. CBC:  Recent Labs Lab 06/22/14 1215 06/23/14 0603 06/24/14 0623 06/25/14 0623  WBC 7.2 6.1 11.0* 13.4*  NEUTROABS 6.2  --   --   --   HGB 11.0* 10.5* 10.4* 9.6*  HCT 31.3* 30.3* 29.4* 27.1*  MCV 86.0 86.1 83.8 84.4  PLT 161 150 155 136*   Cardiac Enzymes:   No results for input(s): CKTOTAL, CKMB, CKMBINDEX, TROPONINI in the last 168 hours. BNP (last 3 results) No results for input(s): PROBNP in the last 8760 hours. CBG:  Recent Labs Lab 06/24/14 1131 06/24/14 1622 06/24/14 2043 06/25/14 0745 06/25/14 1122  GLUCAP 232* 127* 111* 102* 96    Recent Results (from the past 240 hour(s))  Urine culture     Status: None   Collection Time: 06/22/14  1:06 PM  Result Value Ref Range Status   Specimen Description URINE, CATHETERIZED  Final   Special Requests IMMUNE:COMPROMISED  Final   Colony Count   Final    >=100,000 COLONIES/ML Performed at [facility]   Final    KLEBSIELLA PNEUMONIAE Performed at [facility]    Report Status 06/25/2014 FINAL  Final   Organism ID, Bacteria KLEBSIELLA PNEUMONIAE  Final      Susceptibility   Klebsiella pneumoniae - MIC*    AMPICILLIN  >=32 RESISTANT Resistant     CEFAZOLIN <=4 SENSITIVE Sensitive     CEFTRIAXONE <=1 SENSITIVE Sensitive     CIPROFLOXACIN <=0.25 SENSITIVE Sensitive     GENTAMICIN <=1 SENSITIVE Sensitive     LEVOFLOXACIN <=0.12 SENSITIVE Sensitive     NITROFURANTOIN <=16 SENSITIVE Sensitive     TOBRAMYCIN <=1 SENSITIVE Sensitive     TRIMETH/SULFA <=20 SENSITIVE Sensitive     PIP/TAZO <=4 SENSITIVE Sensitive     * KLEBSIELLA PNEUMONIAE     Studies: No results found.  Scheduled Meds: . calcium gluconate 1 GM IV  1 g Intravenous BID  . cilostazol  50 mg Oral BID  . ciprofloxacin  500 mg Oral Q18H  . feeding supplement (ENSURE COMPLETE)  237 mL Oral BID BM  . insulin aspart  0-9 Units Subcutaneous TID WC  .  pantoprazole  40 mg Oral Daily  . pravastatin  80 mg Oral q morning - 10a    Continuous Infusions: . sodium chloride       Time spent: 25 minutes  [facility] Pager [phone]. If 7PM-7AM, please contact night-coverage at [email or web address], password [facility] 06/25/2014, 3:15 PM  LOS: 3 days

## 2014-06-26 ENCOUNTER — Inpatient Hospital Stay (HOSPITAL_COMMUNITY): Payer: Medicare Other

## 2014-06-26 DIAGNOSIS — E876 Hypokalemia: Secondary | ICD-10-CM

## 2014-06-26 DIAGNOSIS — N1330 Unspecified hydronephrosis: Secondary | ICD-10-CM

## 2014-06-26 DIAGNOSIS — N200 Calculus of kidney: Secondary | ICD-10-CM

## 2014-06-26 HISTORY — DX: Unspecified hydronephrosis: N13.30

## 2014-06-26 LAB — BASIC METABOLIC PANEL
Anion gap: 10 (ref 5–15)
BUN: 44 mg/dL — ABNORMAL HIGH (ref 6–23)
CO2: 16 mmol/L — ABNORMAL LOW (ref 19–32)
Calcium: 6.3 mg/dL — CL (ref 8.4–10.5)
Chloride: 113 mEq/L — ABNORMAL HIGH (ref 96–112)
Creatinine, Ser: 1.5 mg/dL — ABNORMAL HIGH (ref 0.50–1.10)
GFR calc non Af Amer: 32 mL/min — ABNORMAL LOW (ref >90)
GFR, EST AFRICAN AMERICAN: 37 mL/min — AB (ref >90)
Glucose, Bld: 93 mg/dL (ref 70–99)
Potassium: 3.1 mmol/L — ABNORMAL LOW (ref 3.5–5.1)
SODIUM: 139 mmol/L (ref 135–145)

## 2014-06-26 LAB — CBC
HCT: 28.1 % — ABNORMAL LOW (ref 36.0–46.0)
HEMOGLOBIN: 9.8 g/dL — AB (ref 12.0–15.0)
MCH: 29.6 pg (ref 26.0–34.0)
MCHC: 34.9 g/dL (ref 30.0–36.0)
MCV: 84.9 fL (ref 78.0–100.0)
PLATELETS: 145 10*3/uL — AB (ref 150–400)
RBC: 3.31 MIL/uL — ABNORMAL LOW (ref 3.87–5.11)
RDW: 13.9 % (ref 11.5–15.5)
WBC: 14.4 10*3/uL — ABNORMAL HIGH (ref 4.0–10.5)

## 2014-06-26 LAB — MAGNESIUM: MAGNESIUM: 0.9 mg/dL — AB (ref 1.5–2.5)

## 2014-06-26 LAB — PARATHYROID HORMONE, INTACT (NO CA): PTH: 128 pg/mL — AB (ref 15–65)

## 2014-06-26 LAB — ALBUMIN: Albumin: 2.1 g/dL — ABNORMAL LOW (ref 3.5–5.2)

## 2014-06-26 LAB — GLUCOSE, CAPILLARY
Glucose-Capillary: 83 mg/dL (ref 70–99)
Glucose-Capillary: 108 mg/dL — ABNORMAL HIGH (ref 70–99)
Glucose-Capillary: 124 mg/dL — ABNORMAL HIGH (ref 70–99)
Glucose-Capillary: 186 mg/dL — ABNORMAL HIGH (ref 70–99)

## 2014-06-26 LAB — PHOSPHORUS: Phosphorus: 1.6 mg/dL — ABNORMAL LOW (ref 2.3–4.6)

## 2014-06-26 MED ORDER — MAGNESIUM SULFATE 2 GM/50ML IV SOLN
2.0000 g | Freq: Once | INTRAVENOUS | Status: AC
  Administered 2014-06-26: 2 g via INTRAVENOUS
  Filled 2014-06-26: qty 50

## 2014-06-26 MED ORDER — ALPRAZOLAM 0.25 MG PO TABS
0.2500 mg | ORAL_TABLET | Freq: Once | ORAL | Status: AC
  Administered 2014-06-26: 0.25 mg via ORAL
  Filled 2014-06-26: qty 1

## 2014-06-26 MED ORDER — ALBUTEROL SULFATE (2.5 MG/3ML) 0.083% IN NEBU
2.5000 mg | INHALATION_SOLUTION | Freq: Four times a day (QID) | RESPIRATORY_TRACT | Status: DC
  Administered 2014-06-26 (×2): 2.5 mg via RESPIRATORY_TRACT
  Filled 2014-06-26 (×2): qty 3

## 2014-06-26 MED ORDER — MAGNESIUM SULFATE 50 % IJ SOLN: 2.0000 g | Freq: Once | INTRAMUSCULAR | Status: DC

## 2014-06-26 MED ORDER — SODIUM CHLORIDE 0.45 % IV SOLN
INTRAVENOUS | Status: DC
  Administered 2014-06-26 – 2014-06-28 (×4): via INTRAVENOUS
  Filled 2014-06-26 (×10): qty 1000

## 2014-06-26 NOTE — Progress Notes (Signed)
0730 MD notified pt's Ca+ = 6.3, Mg+ = 0.9

## 2014-06-26 NOTE — Clinical Social Work Placement (Signed)
Clinical Social Work Department CLINICAL SOCIAL WORK PLACEMENT NOTE 06/26/2014  Patient:  JACKILYN, UMPHLETT  Account Number:  [id number] Admit date:  06/22/2014  Clinical Social Worker:  Benay Pike, LCSW  Date/time:  06/25/2014 10:56 AM  Clinical Social Work is seeking post-discharge placement for this patient at the following level of care:   [place]   (*CSW will update this form in Epic as items are completed)   06/25/2014  Patient/family provided with [place] Department of Clinical Social Work's list of facilities offering this level of care within the geographic area requested by the patient (or if unable, by the patient's family).  06/25/2014  Patient/family informed of their freedom to choose among providers that offer the needed level of care, that participate in Medicare, Medicaid or managed care program needed by the patient, have an available bed and are willing to accept the patient.  06/25/2014  Patient/family informed of [facility]' ownership interest in [facility], as well as of the fact that they are under no obligation to receive care at this facility.  PASARR submitted to EDS on 06/25/2014 PASARR number received on 06/25/2014  FL2 transmitted to all facilities in geographic area requested by pt/family on  06/25/2014 FL2 transmitted to all facilities within larger geographic area on   Patient informed that his/her managed care company has contracts with or will negotiate with  certain facilities, including the following:     Patient/family informed of bed offers received:  06/26/2014 Patient chooses bed at [facility]ician recommends and patient chooses bed at  [facility]  Patient to be transferred to  on   Patient to be transferred to facility by  Patient and family notified of transfer on  Name of family member notified:    The following physician request were entered in Epic:   Additional Comments:  Benay Pike,  [place]

## 2014-06-26 NOTE — Progress Notes (Signed)
0903 MD notified pt's Phosphorus Level = 1.6

## 2014-06-26 NOTE — Progress Notes (Addendum)
[facility] PROGRESS NOTE  Ariana Shea KGY:[id number] DOB: 08-22-1933 DOA: 06/22/2014 PCP: Stephens Shire, MD    Code Status: Full code Family Communication: Discussed with son and daughter-in-law Disposition Plan: Discharge to skilled nursing facility when clinically appropriate, possibly in the next 48 hours.   Consultants:  Nephrology pending  Procedures:  None  Antibiotics:  Cipro  Rocephin, discontinued  HPI/Subjective: The patient is sitting up in bed. Overall, she feels weak, but she denies nausea, vomiting, or diarrhea. Her appetite is improving, but she says that she just feels weak. She also a technologist some shortness of breath and wheezes since yesterday.  Objective: Filed Vitals:   06/26/14 0522  BP: 163/87  Pulse: 94  Temp: 98.6 F (37 C)  Resp: 20    Intake/Output Summary (Last 24 hours) at 06/26/14 1421 Last data filed at 06/26/14 1150  Gross per 24 hour  Intake   1593 ml  Output   2900 ml  Net  -1307 ml   Filed Weights   06/22/14 1107 06/25/14 1308 06/26/14 0522  Weight: 53.978 kg (119 lb) 57.38 kg (126 lb 8 oz) 57.1 kg (125 lb 14.1 oz)    Exam:   General:  81 year old woman sitting up in bed, in no acute distress.  Cardiovascular: S1, S2, with occasional ectopy.  Respiratory: Occasional crackles and fine expiratory wheezes. Breathing not particularly labored.  Abdomen: Positive bowel sounds, soft, nontender, nondistended.  Musculoskeletal/extremities: No acute hot joints. Pedal pulses palpable. No pedal edema.  Neurologic: She is alert and oriented 3. Cranial nerves II through XII are grossly intact.   Data Reviewed: Basic Metabolic Panel:  Recent Labs Lab 06/22/14 1411 06/23/14 0603 06/23/14 0802 06/24/14 0263 06/25/14 0623 06/26/14 0602  NA 135 137  --  137 143 139  K 4.1 3.4*  --  2.6* 2.6* 3.1*  CL 100 106  --  110 117* 113*  CO2 11* 10*  --  17* 16* 16*  GLUCOSE 107* 89  --  197* 117* 93  BUN 147* 122*   --  98* 67* 44*  CREATININE 8.54* 6.64*  --  3.34* 2.05* 1.50*  CALCIUM 5.8* 6.2*  --  6.2* 5.9* 6.3*  MG  --   --  1.0*  --  0.9* 0.9*  PHOS  --   --   --   --   --  1.6*   Liver Function Tests:  Recent Labs Lab 06/22/14 1411 06/26/14 0602  AST 15  --   ALT 17  --   ALKPHOS 54  --   BILITOT 0.7  --   PROT 5.2*  --   ALBUMIN 2.7* 2.1*   No results for input(s): LIPASE, AMYLASE in the last 168 hours. No results for input(s): AMMONIA in the last 168 hours. CBC:  Recent Labs Lab 06/22/14 1215 06/23/14 0603 06/24/14 0623 06/25/14 0623 06/26/14 0602  WBC 7.2 6.1 11.0* 13.4* 14.4*  NEUTROABS 6.2  --   --   --   --   HGB 11.0* 10.5* 10.4* 9.6* 9.8*  HCT 31.3* 30.3* 29.4* 27.1* 28.1*  MCV 86.0 86.1 83.8 84.4 84.9  PLT 161 150 155 136* 145*   Cardiac Enzymes: No results for input(s): CKTOTAL, CKMB, CKMBINDEX, TROPONINI in the last 168 hours. BNP (last 3 results) No results for input(s): PROBNP in the last 8760 hours. CBG:  Recent Labs Lab 06/25/14 1122 06/25/14 1652 06/25/14 2236 06/26/14 0801 06/26/14 1136  GLUCAP 96 114* 94 83 108*  Recent Results (from the past 240 hour(s))  Urine culture     Status: None   Collection Time: 06/22/14  1:06 PM  Result Value Ref Range Status   Specimen Description URINE, CATHETERIZED  Final   Special Requests IMMUNE:COMPROMISED  Final   Colony Count   Final    >=100,000 COLONIES/ML Performed at [facility]    Culture   Final    KLEBSIELLA PNEUMONIAE Performed at [facility]    Report Status 06/25/2014 FINAL  Final   Organism ID, Bacteria KLEBSIELLA PNEUMONIAE  Final      Susceptibility   Klebsiella pneumoniae - MIC*    AMPICILLIN >=32 RESISTANT Resistant     CEFAZOLIN <=4 SENSITIVE Sensitive     CEFTRIAXONE <=1 SENSITIVE Sensitive     CIPROFLOXACIN <=0.25 SENSITIVE Sensitive     GENTAMICIN <=1 SENSITIVE Sensitive     LEVOFLOXACIN <=0.12 SENSITIVE Sensitive     NITROFURANTOIN <=16 SENSITIVE  Sensitive     TOBRAMYCIN <=1 SENSITIVE Sensitive     TRIMETH/SULFA <=20 SENSITIVE Sensitive     PIP/TAZO <=4 SENSITIVE Sensitive     * KLEBSIELLA PNEUMONIAE     Studies: Ct Renal Stone Study  06/25/2014   CLINICAL DATA:  Acute onset right flank pain. Acute renal failure. Surgical history includes cholecystectomy, hysterectomy and unspecified bladder surgery.  EXAM: CT ABDOMEN AND PELVIS WITHOUT CONTRAST  TECHNIQUE: Multidetector CT imaging of the abdomen and pelvis was performed following the standard protocol without IV contrast.  COMPARISON:  No prior CT.  Urinary tract ultrasound 06/23/2014.  FINDINGS: At least 3 nonobstructing calculi a within mid and lower pole calices of the right kidney, the largest approximating 4 mm. Mild right hydronephrosis without associated ureteral dilation. Mild diffuse cortical thinning involving the right kidney. No left urinary tract calculi. No left hydronephrosis. Perinephric edema surrounding both kidneys. Within the limits of the unenhanced technique, no focal parenchymal abnormality involving either kidney.  Normal unenhanced appearance of the liver. Partially calcified approximate 1.2 x 1.4 x 1.7 cm mass in the otherwise normal-appearing spleen. Normal right adrenal gland. Diffuse enlargement of the left adrenal gland without nodularity. Moderate to severe pancreatic atrophy. Gallbladder surgically absent. No biliary ductal dilation.  Bilobed infrarenal abdominal aortic aneurysm which extends to the aortic bifurcation, maximum AP diameter 3.5 cm and maximum transverse diameter 3.0 cm. Severe atherosclerosis involving the iliofemoral arteries with an indwelling left common femoral artery stent. Visceral artery atherosclerosis. No significant lymphadenopathy.  Stomach decompressed and unremarkable. Normal-appearing small bowel. Diverticulosis involving the distal descending and sigmoid colon without evidence of acute diverticulitis. Moderate stool burden. Opaque  ingested material scattered throughout the small bowel, including the duodenum. Appendix not visualized but no pericecal inflammation.  Pessary device in the pelvis. Urinary bladder decompressed by Foley catheter. Uterus surgically absent. No adnexal masses. Small amount of free fluid in the left low pelvis. Multiple bilateral pelvic phleboliths.  Bone window images demonstrate thoracolumbar scoliosis, lower thoracic spondylosis, calcification within the T11-T12 disc, degenerative disc disease at L3-4 L4-5, and mild degenerative changes in both hips. Small bilateral pleural effusions and associated passive atelectasis in the visualized lower lobes without evidence of pulmonary edema. Heart enlarged with mitral annular calcification and right coronary atherosclerosis. Note is made of edema in the dependent subcutaneous tissues of the back.  IMPRESSION: 1. Nonobstructing calculi within mid and lower pole calices of the right kidney. 2. Mild right hydronephrosis secondary to chronic UPJ stenosis, as the ureter is not dilated. 3. No acute abnormalities  involving the abdomen or pelvis. 4. Bilobed in from a renal abdominal aortic aneurysm, maximum AP diameter 3.5 mm. Please see below for followup recommendations. 5. Distal descending and sigmoid colon diverticulosis without evidence of acute diverticulitis. 6. Small bilateral pleural effusions and associated mild passive atelectasis in the visualized lower lobes without evidence of pulmonary edema. Recommend followup by [place] in 2 years. This recommendation follows ACR consensus guidelines: White Paper of the ACR Incidental Findings Committee II on Vascular Findings. J Am Coll Radiol 2013; 10:789-794.   Electronically Signed   By: Evangeline Dakin M.D.   On: 06/25/2014 16:37    Scheduled Meds: . albuterol  2.5 mg Nebulization Q6H  . cilostazol  50 mg Oral BID  . ciprofloxacin  500 mg Oral Q18H  . feeding supplement (ENSURE COMPLETE)  237 mL Oral BID BM  . insulin  aspart  0-9 Units Subcutaneous TID WC  . pantoprazole  40 mg Oral Daily  . pravastatin  80 mg Oral q morning - 10a   Continuous Infusions: . sodium chloride 0.45 % 1,000 mL with potassium chloride 40 mEq, sodium bicarbonate 50 mEq infusion 75 mL/hr at 06/26/14 0942   Assessment and plan:  Principal Problem:   Nausea vomiting and diarrhea Active Problems:   ARF (acute renal failure)   Hypomagnesemia   Hypocalcemia   Metabolic acidosis   Hypophosphatemia   Hypokalemia   UTI (urinary tract infection)   Hydronephrosis of right kidney   Renal stones   Hypertension   DM (diabetes mellitus), type 2 with peripheral vascular complications   PVD (peripheral vascular disease)   GERD (gastroesophageal reflux disease)   Pure hypercholesterolemia   1. Nausea, vomiting, and diarrhea. Etiology likely secondary to acute gastroenteritis with UTI possibly contributing. Also considered diarrhea from metformin. Her symptoms have subsided and she is eating better. We'll continue supportive treatment and IV fluid hydration.  Acute renal failure, likely secondary to prerenal azotemia, but cannot rule out obstructive uropathy and/or a UTI as etiologies. Her baseline creatinine is 1.2; it was 8.91 on admission. CT renal stone study revealed mild right hydronephrosis and right sided renal stones; suspicious for transient obstructive uropathy. No evidence of obstruction currently, but with chronic UPJ stenosis. Her creatinine is almost back to baseline. Her urine output is nonoliguric.  Metabolic acidosis. Etiology is unclear as her lactic acid level and salicylate levels were within normal limits. Could be secondary to acute renal failure, but her CO2 has not improved with improving renal function. Query renal tubular acidosis. We'll consult nephrology in the setting of metabolic acidosis and electrolyte abnormalities. IV fluids changed to add bicarbonate.  Electrolyte abnomalities including  hypomagnesemia, hypokalemia, hypocalcemia, and hypophosphatemia. The patient received IV calcium yesterday with minimal improvement. Her phosphorus level is also low. We'll hold off on further supplementation unless recommended by nephrology. Corrected calcium based on albumin level is 7.8, mildly low. 2 g of magnesium sulfate ordered. Potassium added to IV fluids.  Klebsiella urinary tract infection. Rocephin discontinued in favor of Cipro.  Type 2 diabetes mellitus with peripheral vascular complications. Metformin is on hold and she is being treated with sliding scale NovoLog. CBGs are currently stable and/or controlled. Hemoglobin A1c was 6.1. Given the patient's recent diarrhea and metabolic derangements, would consider starting Lantus or Levemir once or twice daily.  Wheezes/pulmonary crackles. We'll start albuterol nebulizer and order a chest x-ray to rule out pulmonary edema from vigorous IV fluids. If there is pulmonary edema, will treat with Lasix.  Time spent: 40 minutes including discussion with family.    [facility] Pager [phone]. If 7PM-7AM, please contact night-coverage at [email or web address], password [facility] 06/26/2014, 2:21 PM  LOS: 4 days

## 2014-06-26 NOTE — Clinical Social Work Note (Signed)
Pt decided this morning that she would rather stay in [place] for rehab as her family lives here. She accepts bed at [facility]. Facility notified. CSW attempted to reach her son and daughter-in-law, Jenny Reichmann and Katharine Look, but no voicemail set up.   Benay Pike, [place]

## 2014-06-26 NOTE — Care Management Utilization Note (Signed)
UR completed 

## 2014-06-27 LAB — RENAL FUNCTION PANEL
ALBUMIN: 2.1 g/dL — AB (ref 3.5–5.2)
Anion gap: 7 (ref 5–15)
BUN: 25 mg/dL — ABNORMAL HIGH (ref 6–23)
CALCIUM: 6.1 mg/dL — AB (ref 8.4–10.5)
CHLORIDE: 110 meq/L (ref 96–112)
CO2: 23 mmol/L (ref 19–32)
Creatinine, Ser: 1.15 mg/dL — ABNORMAL HIGH (ref 0.50–1.10)
GFR, EST AFRICAN AMERICAN: 51 mL/min — AB (ref >90)
GFR, EST NON AFRICAN AMERICAN: 44 mL/min — AB (ref >90)
Glucose, Bld: 119 mg/dL — ABNORMAL HIGH (ref 70–99)
POTASSIUM: 3.2 mmol/L — AB (ref 3.5–5.1)
Phosphorus: <1 mg/dL — CL (ref 2.3–4.6)
Sodium: 140 mmol/L (ref 135–145)

## 2014-06-27 LAB — GLUCOSE, CAPILLARY
Glucose-Capillary: 116 mg/dL — ABNORMAL HIGH (ref 70–99)
Glucose-Capillary: 183 mg/dL — ABNORMAL HIGH (ref 70–99)
Glucose-Capillary: 180 mg/dL — ABNORMAL HIGH (ref 70–99)
Glucose-Capillary: 223 mg/dL — ABNORMAL HIGH (ref 70–99)

## 2014-06-27 LAB — CBC
HEMATOCRIT: 28.2 % — AB (ref 36.0–46.0)
HEMOGLOBIN: 9.8 g/dL — AB (ref 12.0–15.0)
MCH: 29.5 pg (ref 26.0–34.0)
MCHC: 34.8 g/dL (ref 30.0–36.0)
MCV: 84.9 fL (ref 78.0–100.0)
Platelets: 145 10*3/uL — ABNORMAL LOW (ref 150–400)
RBC: 3.32 MIL/uL — ABNORMAL LOW (ref 3.87–5.11)
RDW: 13.6 % (ref 11.5–15.5)
WBC: 10.1 10*3/uL (ref 4.0–10.5)

## 2014-06-27 LAB — NA AND K (SODIUM & POTASSIUM), RAND UR
Potassium Urine: 32 mmol/L
Sodium, Ur: 41 mmol/L

## 2014-06-27 LAB — CHLORIDE, URINE, RANDOM: Chloride Urine: 52 mmol/L

## 2014-06-27 LAB — PHOSPHORUS: Phosphorus: 0.9 mg/dL — CL (ref 2.3–4.6)

## 2014-06-27 LAB — CREATININE, URINE, RANDOM: Creatinine, Urine: 56.55 mg/dL

## 2014-06-27 LAB — MAGNESIUM: Magnesium: 0.9 mg/dL — CL (ref 1.5–2.5)

## 2014-06-27 MED ORDER — ALBUTEROL SULFATE (2.5 MG/3ML) 0.083% IN NEBU
2.5000 mg | INHALATION_SOLUTION | Freq: Four times a day (QID) | RESPIRATORY_TRACT | Status: DC
  Administered 2014-06-27: 2.5 mg via RESPIRATORY_TRACT
  Filled 2014-06-27: qty 3

## 2014-06-27 MED ORDER — K PHOS MONO-SOD PHOS DI & MONO 155-852-130 MG PO TABS
500.0000 mg | ORAL_TABLET | Freq: Three times a day (TID) | ORAL | Status: DC
  Administered 2014-06-27 – 2014-06-29 (×7): 500 mg via ORAL
  Filled 2014-06-27 (×13): qty 2

## 2014-06-27 MED ORDER — CARVEDILOL 12.5 MG PO TABS
12.5000 mg | ORAL_TABLET | Freq: Two times a day (BID) | ORAL | Status: DC
  Administered 2014-06-27 – 2014-06-28 (×3): 12.5 mg via ORAL
  Filled 2014-06-27 (×3): qty 1

## 2014-06-27 MED ORDER — MAGNESIUM SULFATE 50 % IJ SOLN: 4.0000 g | Freq: Once | INTRAMUSCULAR | Status: DC

## 2014-06-27 MED ORDER — DEXTROSE 5 % IV SOLN
20.0000 meq | Freq: Once | INTRAVENOUS | Status: AC
  Administered 2014-06-27: 20 meq via INTRAVENOUS
  Filled 2014-06-27: qty 4.55

## 2014-06-27 MED ORDER — MAGNESIUM SULFATE 2 GM/50ML IV SOLN
2.0000 g | INTRAVENOUS | Status: AC
  Administered 2014-06-27 (×2): 2 g via INTRAVENOUS
  Filled 2014-06-27 (×2): qty 50

## 2014-06-27 MED ORDER — LEVALBUTEROL HCL 0.63 MG/3ML IN NEBU
0.6300 mg | INHALATION_SOLUTION | Freq: Four times a day (QID) | RESPIRATORY_TRACT | Status: DC
  Administered 2014-06-27 – 2014-06-29 (×8): 0.63 mg via RESPIRATORY_TRACT
  Filled 2014-06-27 (×8): qty 3

## 2014-06-27 NOTE — Progress Notes (Signed)
Blaine.Garnet MD notified of pt's Mg+ Level 0.9 this morning.

## 2014-06-27 NOTE — Consult Note (Signed)
Reason for Consult: Acute kidney injury and multiple electrolyte imbalance Referring Physician: Dr. Denyse Amass ARNOLD DEPINTO is an 81 y.o. female.  HPI: She is a patient has history of diabetes, hypertension, peripheral vascular disease presently came with complaints of nausea, vomiting and diarrhea of a couple of days duration. When she was evaluated patient was found to have acute renal failure, hypokalemia, hypophosphatemia, hypomagnesemia. Presently her renal function is improved but continue to have problem with electrolyte his consult is called. Presently patient is still that she is feeling much better. She denies any nausea no vomiting. Her diarrhea has improved but her appetite is still is not good.  Past Medical History  Diagnosis Date  . Hypertension   . Diabetes mellitus   . 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  . Anemia   . Pure hypercholesterolemia   . GERD (gastroesophageal reflux disease)   . Generalized and unspecified atherosclerosis   . Pneumonia     hx  . UTI (lower urinary tract infection)     recent tx   . Swallowing difficulty     gets crumbs caught in throat-told may have to have esophagus streched  . Arthritis     HANDS  . Blood transfusion without reported diagnosis   . Ulcer     Past Surgical History  Procedure Laterality Date  . Abdominal hysterectomy    . Cholecystectomy    . Bladder surgery      tuck  . Artery repair Bilateral     corotid artery surgery  . Carotid endarterectomy      bilateral CEA 2008; (L) 11/2006, (R) 01/2007 with reperfusion phenomenon (seizure) post-operaitvely  . Multiple extractions with alveoloplasty N/A 12/07/2013    Procedure: MULTIPLE EXTRACTION WITH ALVEOLOPLASTY;  Surgeon: Gae Bon, DDS;  Location: [facility];  Service: Oral Surgery;  Laterality: N/A;    Family History  Problem Relation Age of Onset  . Heart disease Father     Social  History:  reports that she quit smoking about 5 years ago. Her smoking use included Cigarettes. She has a 56 pack-year smoking history. She has never used smokeless tobacco. She reports that she does not drink alcohol or use illicit drugs.  Allergies:  Allergies  Allergen Reactions  . Red Blood Cells Other (See Comments)    Patient is a Jehovah's Witness  . Penicillins Rash  . Sulfonamide Derivatives Rash    Medications: I have reviewed the patient's current medications.  Results for orders placed or performed during the hospital encounter of 06/22/14 (from the past 48 hour(s))  Glucose, capillary     Status: Abnormal   Collection Time: 06/25/14  4:52 PM  Result Value Ref Range   Glucose-Capillary 114 (H) 70 - 99 mg/dL   Comment 1 Notify RN   Glucose, capillary     Status: None   Collection Time: 06/25/14 10:36 PM  Result Value Ref Range   Glucose-Capillary 94 70 - 99 mg/dL  Basic metabolic panel     Status: Abnormal   Collection Time: 06/26/14  6:02 AM  Result Value Ref Range   Sodium 139 135 - 145 mmol/L    Comment: Please note change in reference range.   Potassium 3.1 (L) 3.5 - 5.1 mmol/L    Comment: Please note change in reference range.   Chloride 113 (H) 96 - 112 mEq/L   CO2 16 (L) 19 - 32 mmol/L  Glucose, Bld 93 70 - 99 mg/dL   BUN 44 (H) 6 - 23 mg/dL    Comment: DELTA CHECK NOTED   Creatinine, Ser 1.50 (H) 0.50 - 1.10 mg/dL   Calcium 6.3 (LL) 8.4 - 10.5 mg/dL    Comment: CRITICAL RESULT CALLED TO, READ BACK BY AND VERIFIED WITH: MCKINNEY,S AT 7:30AM ON 06/26/14 BY FESTERMAN,C    GFR calc non Af Amer 32 (L) >90 mL/min   GFR calc Af Amer 37 (L) >90 mL/min    Comment: (NOTE) The eGFR has been calculated using the CKD EPI equation. This calculation has not been validated in all clinical situations. eGFR's persistently <90 mL/min signify possible Chronic Kidney Disease.    Anion gap 10 5 - 15  CBC     Status: Abnormal   Collection Time: 06/26/14  6:02 AM   Result Value Ref Range   WBC 14.4 (H) 4.0 - 10.5 K/uL   RBC 3.31 (L) 3.87 - 5.11 MIL/uL   Hemoglobin 9.8 (L) 12.0 - 15.0 g/dL   HCT 28.1 (L) 36.0 - 46.0 %   MCV 84.9 78.0 - 100.0 fL   MCH 29.6 26.0 - 34.0 pg   MCHC 34.9 30.0 - 36.0 g/dL   RDW 13.9 11.5 - 15.5 %   Platelets 145 (L) 150 - 400 K/uL  Magnesium     Status: Abnormal   Collection Time: 06/26/14  6:02 AM  Result Value Ref Range   Magnesium 0.9 (LL) 1.5 - 2.5 mg/dL    Comment: CRITICAL RESULT CALLED TO, READ BACK BY AND VERIFIED WITH: MCKINNEY,S 7:30AM ON 06/26/14 BY FESTERMAN,C   Phosphorus     Status: Abnormal   Collection Time: 06/26/14  6:02 AM  Result Value Ref Range   Phosphorus 1.6 (L) 2.3 - 4.6 mg/dL  Albumin     Status: Abnormal   Collection Time: 06/26/14  6:02 AM  Result Value Ref Range   Albumin 2.1 (L) 3.5 - 5.2 g/dL  Glucose, capillary     Status: None   Collection Time: 06/26/14  8:01 AM  Result Value Ref Range   Glucose-Capillary 83 70 - 99 mg/dL  Glucose, capillary     Status: Abnormal   Collection Time: 06/26/14 11:36 AM  Result Value Ref Range   Glucose-Capillary 108 (H) 70 - 99 mg/dL  Glucose, capillary     Status: Abnormal   Collection Time: 06/26/14  4:35 PM  Result Value Ref Range   Glucose-Capillary 124 (H) 70 - 99 mg/dL   Comment 1 Notify RN   Glucose, capillary     Status: Abnormal   Collection Time: 06/26/14  8:05 PM  Result Value Ref Range   Glucose-Capillary 186 (H) 70 - 99 mg/dL   Comment 1 Notify RN   Renal function panel     Status: Abnormal   Collection Time: 06/27/14  6:02 AM  Result Value Ref Range   Sodium 140 135 - 145 mmol/L    Comment: Please note change in reference range.   Potassium 3.2 (L) 3.5 - 5.1 mmol/L    Comment: DELTA CHECK NOTED Please note change in reference range.    Chloride 110 96 - 112 mEq/L   CO2 23 19 - 32 mmol/L   Glucose, Bld 119 (H) 70 - 99 mg/dL   BUN 25 (H) 6 - 23 mg/dL   Creatinine, Ser 1.15 (H) 0.50 - 1.10 mg/dL   Calcium 6.1 (LL) 8.4 -  10.5 mg/dL    Comment: CRITICAL RESULT CALLED  TO, READ BACK BY AND VERIFIED WITH: MCGIBBY,C AT 7:30AM ON 06/27/14 BY FESTERMAN,C    Phosphorus <1.0 (LL) 2.3 - 4.6 mg/dL    Comment: CRITICAL RESULT CALLED TO, READ BACK BY AND VERIFIED WITH: MCGIBBY,C AT 7:30AM ON 06/27/14 BY FESTERMAN,C    Albumin 2.1 (L) 3.5 - 5.2 g/dL   GFR calc non Af Amer 44 (L) >90 mL/min   GFR calc Af Amer 51 (L) >90 mL/min    Comment: (NOTE) The eGFR has been calculated using the CKD EPI equation. This calculation has not been validated in all clinical situations. eGFR's persistently <90 mL/min signify possible Chronic Kidney Disease.    Anion gap 7 5 - 15  CBC     Status: Abnormal   Collection Time: 06/27/14  6:02 AM  Result Value Ref Range   WBC 10.1 4.0 - 10.5 K/uL   RBC 3.32 (L) 3.87 - 5.11 MIL/uL   Hemoglobin 9.8 (L) 12.0 - 15.0 g/dL   HCT 28.2 (L) 36.0 - 46.0 %   MCV 84.9 78.0 - 100.0 fL   MCH 29.5 26.0 - 34.0 pg   MCHC 34.8 30.0 - 36.0 g/dL   RDW 13.6 11.5 - 15.5 %   Platelets 145 (L) 150 - 400 K/uL  Magnesium     Status: Abnormal   Collection Time: 06/27/14  6:02 AM  Result Value Ref Range   Magnesium 0.9 (LL) 1.5 - 2.5 mg/dL    Comment: CRITICAL RESULT CALLED TO, READ BACK BY AND VERIFIED WITH: MCGIBBY,C AT 7:30AM ON 06/27/14 BY FESTERMAN,C   Glucose, capillary     Status: Abnormal   Collection Time: 06/27/14  7:52 AM  Result Value Ref Range   Glucose-Capillary 116 (H) 70 - 99 mg/dL   Comment 1 Notify RN   Glucose, capillary     Status: Abnormal   Collection Time: 06/27/14 11:14 AM  Result Value Ref Range   Glucose-Capillary 183 (H) 70 - 99 mg/dL   Comment 1 Notify RN   Phosphorus     Status: Abnormal   Collection Time: 06/27/14 12:25 PM  Result Value Ref Range   Phosphorus 0.9 (LL) 2.3 - 4.6 mg/dL    Comment: RESULT REPEATED AND VERIFIED CRITICAL RESULT CALLED TO, READ BACK BY AND VERIFIED WITH: MCGEIBLONY,C '@1318'  BY HUFFINES,S ON 06/27/14   Na and K (sodium & potassium), rand  urine     Status: None   Collection Time: 06/27/14  1:43 PM  Result Value Ref Range   Sodium, Ur 41 mmol/L   Potassium Urine Timed 32 mmol/L  Chloride, urine, random     Status: None   Collection Time: 06/27/14  1:43 PM  Result Value Ref Range   Chloride Urine 52 mmol/L  Creatinine, urine, random     Status: None   Collection Time: 06/27/14  1:43 PM  Result Value Ref Range   Creatinine, Urine 56.55 mg/dL    Dg Chest 2 View  06/26/2014   CLINICAL DATA:  Wheezing and shortness of breath.  EXAM: CHEST  2 VIEW  COMPARISON:  06/22/2014 acute abdomen series.  FINDINGS: Mild hyperinflation. Midline trachea. Normal heart size. Atherosclerosis in the transverse aorta. Trace left pleural fluid . No pneumothorax. No congestive failure. Diffuse peribronchial thickening. Mild hyperinflation. Linear opacity radiating from the right hilum is likely due to subsegmental atelectasis. New since the prior.  IMPRESSION: COPD/chronic bronchitis.  No acute superimposed process.  Trace left pleural fluid is similar.   Electronically Signed   By: Marylyn Ishihara  Jobe Igo M.D.   On: 06/26/2014 15:09   Ct Renal Stone Study  06/25/2014   CLINICAL DATA:  Acute onset right flank pain. Acute renal failure. Surgical history includes cholecystectomy, hysterectomy and unspecified bladder surgery.  EXAM: CT ABDOMEN AND PELVIS WITHOUT CONTRAST  TECHNIQUE: Multidetector CT imaging of the abdomen and pelvis was performed following the standard protocol without IV contrast.  COMPARISON:  No prior CT.  Urinary tract ultrasound 06/23/2014.  FINDINGS: At least 3 nonobstructing calculi a within mid and lower pole calices of the right kidney, the largest approximating 4 mm. Mild right hydronephrosis without associated ureteral dilation. Mild diffuse cortical thinning involving the right kidney. No left urinary tract calculi. No left hydronephrosis. Perinephric edema surrounding both kidneys. Within the limits of the unenhanced technique, no focal  parenchymal abnormality involving either kidney.  Normal unenhanced appearance of the liver. Partially calcified approximate 1.2 x 1.4 x 1.7 cm mass in the otherwise normal-appearing spleen. Normal right adrenal gland. Diffuse enlargement of the left adrenal gland without nodularity. Moderate to severe pancreatic atrophy. Gallbladder surgically absent. No biliary ductal dilation.  Bilobed infrarenal abdominal aortic aneurysm which extends to the aortic bifurcation, maximum AP diameter 3.5 cm and maximum transverse diameter 3.0 cm. Severe atherosclerosis involving the iliofemoral arteries with an indwelling left common femoral artery stent. Visceral artery atherosclerosis. No significant lymphadenopathy.  Stomach decompressed and unremarkable. Normal-appearing small bowel. Diverticulosis involving the distal descending and sigmoid colon without evidence of acute diverticulitis. Moderate stool burden. Opaque ingested material scattered throughout the small bowel, including the duodenum. Appendix not visualized but no pericecal inflammation.  Pessary device in the pelvis. Urinary bladder decompressed by Foley catheter. Uterus surgically absent. No adnexal masses. Small amount of free fluid in the left low pelvis. Multiple bilateral pelvic phleboliths.  Bone window images demonstrate thoracolumbar scoliosis, lower thoracic spondylosis, calcification within the T11-T12 disc, degenerative disc disease at L3-4 L4-5, and mild degenerative changes in both hips. Small bilateral pleural effusions and associated passive atelectasis in the visualized lower lobes without evidence of pulmonary edema. Heart enlarged with mitral annular calcification and right coronary atherosclerosis. Note is made of edema in the dependent subcutaneous tissues of the back.  IMPRESSION: 1. Nonobstructing calculi within mid and lower pole calices of the right kidney. 2. Mild right hydronephrosis secondary to chronic UPJ stenosis, as the ureter is not  dilated. 3. No acute abnormalities involving the abdomen or pelvis. 4. Bilobed in from a renal abdominal aortic aneurysm, maximum AP diameter 3.5 mm. Please see below for followup recommendations. 5. Distal descending and sigmoid colon diverticulosis without evidence of acute diverticulitis. 6. Small bilateral pleural effusions and associated mild passive atelectasis in the visualized lower lobes without evidence of pulmonary edema. Recommend followup by [place] in 2 years. This recommendation follows ACR consensus guidelines: White Paper of the ACR Incidental Findings Committee II on Vascular Findings. J Am Coll Radiol 2013; 10:789-794.   Electronically Signed   By: Evangeline Dakin M.D.   On: 06/25/2014 16:37    Review of Systems  Constitutional: Negative for fever.  Respiratory: Negative for shortness of breath.   Cardiovascular: Negative for orthopnea and leg swelling.  Gastrointestinal: Positive for nausea, vomiting and diarrhea. Negative for abdominal pain.  Neurological: Positive for weakness.   Blood pressure 131/59, pulse 89, temperature 98 F (36.7 C), temperature source Oral, resp. rate 20, height '5\' 3"'  (1.6 m), weight 53.207 kg (117 lb 4.8 oz), SpO2 93 %. Physical Exam  Constitutional: No distress.  Eyes: Scleral icterus  is present.  Neck: No JVD present.  Cardiovascular: Normal rate and regular rhythm.   No murmur heard. Respiratory: No respiratory distress. She has no wheezes.  GI: There is no tenderness.  Musculoskeletal: She exhibits no edema.    Assessment/Plan: Problem #1 acute kidney injury: Possibly accommodation of prerenal syndrome and ATN/right mild hydronephrosis.. Presently her BUN and creatinine has been improving progressively. Patient is nonoliguric. Problem #2 hypokalemia: Possibly secondary to diarrhea as patient has low CO2 i.e. metabolic acidosis. This also could be secondary to hypomagnesmia. Problem #3 hypomagnesemia: Possibly GI or renal. Presently patient is  on a magnesium supplement but it remains low. Problem #4 hypophosphatemia: Her phosphorus is 0.9. Patient on Neutra-Phos but her phosphorus is still low. This also associated possibly from poor by mouth intake, diarrhea. Presently renalcannot be ruled out. Problem #5 history of nausea, vomiting diarrhea presently improving. Problem #6 hypertension: Her blood pressure is slightly high Problem #7 anemia Plan: We'll start patient on potassium phosphate 20 mEq in 150 mL of normal saline IV We'll continue his oral phosphorus supplement We'll check urine sodium, potassium , chloride, creatinine in magnesium. We'll check iron studies in the morning and will continue his other treatment as before.  Rodman Recupero S 06/27/2014, 2:46 PM

## 2014-06-27 NOTE — Progress Notes (Signed)
1421 Lab called to report Phosphorus level 0.9, Dr.Fisher & Dr.Befekadu notified.

## 2014-06-27 NOTE — Progress Notes (Signed)
77 Pt informed of new order per MD that she will no longer be on Metformin indefinitely & will need to take insulin. Pt educated on the correct way to give herself an insulin shot & was able to cleanse her LEFT thigh with alcohol, pinch the SQ tissue up & inject the insulin into the SQ tissue via an insulin needle. Pt did well & upon teach back was able to list the locations on her body where she should receive insulin, back of arms & anterior thighs.

## 2014-06-27 NOTE — Progress Notes (Signed)
[facility] PROGRESS NOTE  Ariana Shea MVH:[id number] DOB: 1933-09-27 DOA: 06/22/2014 PCP: Stephens Shire, MD    Code Status: Full code Family Communication: Discussed with son and daughter-in-law on 06/26/14 Disposition Plan: Discharge to skilled nursing facility when clinically appropriate, possibly in the next 48 hours.   Consultants:  Nephrology  Procedures:  None  Antibiotics:  Cipro  Rocephin, discontinued  HPI/Subjective: The patient feels better, a little stronger, than she did yesterday. No complaints of nausea, vomiting, or diarrhea.  Objective: Filed Vitals:   06/27/14 0546  BP: 163/69  Pulse: 110  Temp: 98.9 F (37.2 C)  Resp: 24    Intake/Output Summary (Last 24 hours) at 06/27/14 1335 Last data filed at 06/27/14 0551  Gross per 24 hour  Intake    240 ml  Output   2000 ml  Net  -1760 ml   Filed Weights   06/25/14 1308 06/26/14 0522 06/27/14 0651  Weight: 57.38 kg (126 lb 8 oz) 57.1 kg (125 lb 14.1 oz) 53.207 kg (117 lb 4.8 oz)    Exam:   General:  81 year old woman sitting up in bed, in no acute distress.  Cardiovascular: S1, S2, with occasional ectopy.  Respiratory:  Clear to auscultation with no audible wheezes or crackles ; significantly improved from yesterday.  Abdomen: Positive bowel sounds, soft, nontender, nondistended.  Musculoskeletal/extremities: No acute hot joints. Pedal pulses palpable. No pedal edema.  Neurologic: She is alert and oriented 3. Cranial nerves II through XII are grossly intact.   Data Reviewed: Basic Metabolic Panel:  Recent Labs Lab 06/23/14 0603 06/23/14 0802 06/24/14 8413 06/25/14 2440 06/26/14 0602 06/27/14 0602 06/27/14 1225  NA 137  --  137 143 139 140  --   K 3.4*  --  2.6* 2.6* 3.1* 3.2*  --   CL 106  --  110 117* 113* 110  --   CO2 10*  --  17* 16* 16* 23  --   GLUCOSE 89  --  197* 117* 93 119*  --   BUN 122*  --  98* 67* 44* 25*  --   CREATININE 6.64*  --  3.34* 2.05* 1.50*  1.15*  --   CALCIUM 6.2*  --  6.2* 5.9* 6.3* 6.1*  --   MG  --  1.0*  --  0.9* 0.9* 0.9*  --   PHOS  --   --   --   --  1.6* <1.0* 0.9*   Liver Function Tests:  Recent Labs Lab 06/22/14 1411 06/26/14 0602 06/27/14 0602  AST 15  --   --   ALT 17  --   --   ALKPHOS 54  --   --   BILITOT 0.7  --   --   PROT 5.2*  --   --   ALBUMIN 2.7* 2.1* 2.1*   No results for input(s): LIPASE, AMYLASE in the last 168 hours. No results for input(s): AMMONIA in the last 168 hours. CBC:  Recent Labs Lab 06/22/14 1215 06/23/14 0603 06/24/14 1027 06/25/14 0623 06/26/14 0602 06/27/14 0602  WBC 7.2 6.1 11.0* 13.4* 14.4* 10.1  NEUTROABS 6.2  --   --   --   --   --   HGB 11.0* 10.5* 10.4* 9.6* 9.8* 9.8*  HCT 31.3* 30.3* 29.4* 27.1* 28.1* 28.2*  MCV 86.0 86.1 83.8 84.4 84.9 84.9  PLT 161 150 155 136* 145* 145*   Cardiac Enzymes: No results for input(s): CKTOTAL, CKMB, CKMBINDEX, TROPONINI in the last 168 hours.  BNP (last 3 results) No results for input(s): PROBNP in the last 8760 hours. CBG:  Recent Labs Lab 06/26/14 1136 06/26/14 1635 06/26/14 2005 06/27/14 0752 06/27/14 1114  GLUCAP 108* 124* 186* 116* 183*    Recent Results (from the past 240 hour(s))  Urine culture     Status: None   Collection Time: 06/22/14  1:06 PM  Result Value Ref Range Status   Specimen Description URINE, CATHETERIZED  Final   Special Requests IMMUNE:COMPROMISED  Final   Colony Count   Final    >=100,000 COLONIES/ML Performed at [facility]    Culture   Final    KLEBSIELLA PNEUMONIAE Performed at [facility]    Report Status 06/25/2014 FINAL  Final   Organism ID, Bacteria KLEBSIELLA PNEUMONIAE  Final      Susceptibility   Klebsiella pneumoniae - MIC*    AMPICILLIN >=32 RESISTANT Resistant     CEFAZOLIN <=4 SENSITIVE Sensitive     CEFTRIAXONE <=1 SENSITIVE Sensitive     CIPROFLOXACIN <=0.25 SENSITIVE Sensitive     GENTAMICIN <=1 SENSITIVE Sensitive     LEVOFLOXACIN <=0.12  SENSITIVE Sensitive     NITROFURANTOIN <=16 SENSITIVE Sensitive     TOBRAMYCIN <=1 SENSITIVE Sensitive     TRIMETH/SULFA <=20 SENSITIVE Sensitive     PIP/TAZO <=4 SENSITIVE Sensitive     * KLEBSIELLA PNEUMONIAE     Studies: Dg Chest 2 View  06/26/2014   CLINICAL DATA:  Wheezing and shortness of breath.  EXAM: CHEST  2 VIEW  COMPARISON:  06/22/2014 acute abdomen series.  FINDINGS: Mild hyperinflation. Midline trachea. Normal heart size. Atherosclerosis in the transverse aorta. Trace left pleural fluid . No pneumothorax. No congestive failure. Diffuse peribronchial thickening. Mild hyperinflation. Linear opacity radiating from the right hilum is likely due to subsegmental atelectasis. New since the prior.  IMPRESSION: COPD/chronic bronchitis.  No acute superimposed process.  Trace left pleural fluid is similar.   Electronically Signed   By: Abigail Miyamoto M.D.   On: 06/26/2014 15:09   Ct Renal Stone Study  06/25/2014   CLINICAL DATA:  Acute onset right flank pain. Acute renal failure. Surgical history includes cholecystectomy, hysterectomy and unspecified bladder surgery.  EXAM: CT ABDOMEN AND PELVIS WITHOUT CONTRAST  TECHNIQUE: Multidetector CT imaging of the abdomen and pelvis was performed following the standard protocol without IV contrast.  COMPARISON:  No prior CT.  Urinary tract ultrasound 06/23/2014.  FINDINGS: At least 3 nonobstructing calculi a within mid and lower pole calices of the right kidney, the largest approximating 4 mm. Mild right hydronephrosis without associated ureteral dilation. Mild diffuse cortical thinning involving the right kidney. No left urinary tract calculi. No left hydronephrosis. Perinephric edema surrounding both kidneys. Within the limits of the unenhanced technique, no focal parenchymal abnormality involving either kidney.  Normal unenhanced appearance of the liver. Partially calcified approximate 1.2 x 1.4 x 1.7 cm mass in the otherwise normal-appearing spleen. Normal  right adrenal gland. Diffuse enlargement of the left adrenal gland without nodularity. Moderate to severe pancreatic atrophy. Gallbladder surgically absent. No biliary ductal dilation.  Bilobed infrarenal abdominal aortic aneurysm which extends to the aortic bifurcation, maximum AP diameter 3.5 cm and maximum transverse diameter 3.0 cm. Severe atherosclerosis involving the iliofemoral arteries with an indwelling left common femoral artery stent. Visceral artery atherosclerosis. No significant lymphadenopathy.  Stomach decompressed and unremarkable. Normal-appearing small bowel. Diverticulosis involving the distal descending and sigmoid colon without evidence of acute diverticulitis. Moderate stool burden. Opaque ingested material scattered throughout  the small bowel, including the duodenum. Appendix not visualized but no pericecal inflammation.  Pessary device in the pelvis. Urinary bladder decompressed by Foley catheter. Uterus surgically absent. No adnexal masses. Small amount of free fluid in the left low pelvis. Multiple bilateral pelvic phleboliths.  Bone window images demonstrate thoracolumbar scoliosis, lower thoracic spondylosis, calcification within the T11-T12 disc, degenerative disc disease at L3-4 L4-5, and mild degenerative changes in both hips. Small bilateral pleural effusions and associated passive atelectasis in the visualized lower lobes without evidence of pulmonary edema. Heart enlarged with mitral annular calcification and right coronary atherosclerosis. Note is made of edema in the dependent subcutaneous tissues of the back.  IMPRESSION: 1. Nonobstructing calculi within mid and lower pole calices of the right kidney. 2. Mild right hydronephrosis secondary to chronic UPJ stenosis, as the ureter is not dilated. 3. No acute abnormalities involving the abdomen or pelvis. 4. Bilobed in from a renal abdominal aortic aneurysm, maximum AP diameter 3.5 mm. Please see below for followup recommendations.  5. Distal descending and sigmoid colon diverticulosis without evidence of acute diverticulitis. 6. Small bilateral pleural effusions and associated mild passive atelectasis in the visualized lower lobes without evidence of pulmonary edema. Recommend followup by [place] in 2 years. This recommendation follows ACR consensus guidelines: White Paper of the ACR Incidental Findings Committee II on Vascular Findings. J Am Coll Radiol 2013; 10:789-794.   Electronically Signed   By: Evangeline Dakin M.D.   On: 06/25/2014 16:37    Scheduled Meds: . carvedilol  12.5 mg Oral BID WC  . cilostazol  50 mg Oral BID  . ciprofloxacin  500 mg Oral Q18H  . feeding supplement (ENSURE COMPLETE)  237 mL Oral BID BM  . insulin aspart  0-9 Units Subcutaneous TID WC  . levalbuterol  0.63 mg Nebulization Q6H  . pantoprazole  40 mg Oral Daily  . phosphorus  500 mg Oral TID  . potassium phosphate IVPB (mEq)  20 mEq Intravenous Once  . pravastatin  80 mg Oral q morning - 10a   Continuous Infusions: . sodium chloride 0.45 % 1,000 mL with potassium chloride 40 mEq, sodium bicarbonate 50 mEq infusion 75 mL/hr at 06/27/14 0539   Assessment and plan:  Principal Problem:   Nausea vomiting and diarrhea Active Problems:   ARF (acute renal failure)   Hypomagnesemia   Hypocalcemia   Metabolic acidosis   Hypophosphatemia   Hypokalemia   UTI (urinary tract infection)   Hydronephrosis of right kidney   Renal stones   Hypertension   DM (diabetes mellitus), type 2 with peripheral vascular complications   PVD (peripheral vascular disease)   GERD (gastroesophageal reflux disease)   Pure hypercholesterolemia   1. Nausea, vomiting, and diarrhea. Etiology likely secondary to acute gastroenteritis with UTI possibly contributing. Also considered diarrhea from metformin. Her symptoms have subsided and she is eating better. We'll continue supportive treatment and IV fluid hydration.  Acute renal failure, likely secondary to  prerenal azotemia, but cannot rule out obstructive uropathy and/or a UTI as etiologies. Her baseline creatinine is 1.2; it was 8.91 on admission. CT renal stone study revealed mild right hydronephrosis and right sided renal stones; suspicious for transient obstructive uropathy. No evidence of obstruction currently, but with chronic UPJ stenosis. Her creatinine is back to baseline. Her urine output is nonoliguric.  Metabolic acidosis. Etiology is unclear as her lactic acid level and salicylate levels were within normal limits. Could be secondary to acute renal failure, but her CO2 had not improved  with IV fluid hydration with normal saline only,  but has normalized with bicarbonate added to the IV fluids. Query renal tubular acidosis.  Electrolyte abnomalities including hypomagnesemia, hypokalemia, hypocalcemia, and hypophosphatemia. The patient received IV calcium yesterday with minimal improvement. Her phosphorus level is also low. Ordered K-Phos 3 times a day. Neurology consulted and ordered IV phosphorus 1. Corrected calcium based on albumin level is 7.8, mildly low. 2 g of magnesium sulfate given on 1/6.   Another 4 g ordered today. Potassium added to IV fluids. Further recommendations per nephrology.  Klebsiella urinary tract infection. Rocephin discontinued in favor of Cipro. We'll discontinue Foley catheter tomorrow.  Type 2 diabetes mellitus with peripheral vascular complications. Metformin is on hold and she is being treated with sliding scale NovoLog. CBGs are currently stable and/or controlled. Hemoglobin A1c was 6.1. Given the patient's recent diarrhea and metabolic derangements, would consider starting Lantus or Levemir once or twice daily for long-term treatment.  Wheezes/pulmonary crackles , likely secondary to mild COPD exacerbation. Chest x-ray on 1/6, revealed no pulmonary edema , but COPD. Continue bronchodilators.       Time spent:  30  minutes    [facility] Pager [phone]. If 7PM-7AM, please contact night-coverage at [email or web address], password [facility] 06/27/2014, 1:35 PM  LOS: 5 days

## 2014-06-27 NOTE — Progress Notes (Signed)
Physical Therapy Session Note  Patient Details  Name: CORTLYN CANNELL MRN: [id number] Date of Birth: May 22, 1934  Today's Date: 06/27/2014 PT Individual Time:  -  1142-1200     Skilled Therapeutic Interventions/Progress Updates:    Bed exercises complete for LE strengthening, cueing to slow down all exercises.  No AAROM required with exercises just cueing for form and techniuqe.  Improved bed mobility this session, pt able to come supine to sit following cues for hand and foot placement with supervision for safety.  Multiple reps complete for functional strengtheing with sit to stand with cueing for handplacement for safety. Pt able to ambulate 2 feet to chair and limited by fatigue following due to weakness.  Pt left in chair with chair alarm set and call bell within reach, RN in room.    Therapy Documentation Precautions:  Precautions Precautions: Fall Restrictions Weight Bearing Restrictions: No Locomotion : Ambulation Ambulation Distance (Feet): 2 Feet  Exercises: General Exercises - Lower Extremity Ankle Circles/Pumps: AROM;Both;10 reps;Supine Quad Sets: AROM;Both;Supine;10 reps Long Arc Quad: AROM;Both;10 reps;Seated Heel Slides: AROM;Both;10 reps;Supine Hip ABduction/ADduction: AROM;Both;10 reps;Supine Straight Leg Raises: AROM;10 reps;Both;Supine Other Exercises Other Exercises: 10 bridges Other Treatments:    See FIM for current functional status  Therapy/Group: Individual Therapy  Aldona Lento 06/27/2014, 3:44 PM

## 2014-06-28 DIAGNOSIS — D649 Anemia, unspecified: Secondary | ICD-10-CM | POA: Diagnosis present

## 2014-06-28 LAB — RENAL FUNCTION PANEL
ALBUMIN: 2.2 g/dL — AB (ref 3.5–5.2)
Anion gap: 8 (ref 5–15)
BUN: 18 mg/dL (ref 6–23)
CHLORIDE: 104 meq/L (ref 96–112)
CO2: 25 mmol/L (ref 19–32)
CREATININE: 0.92 mg/dL (ref 0.50–1.10)
Calcium: 5.6 mg/dL — CL (ref 8.4–10.5)
GFR calc Af Amer: 67 mL/min — ABNORMAL LOW (ref >90)
GFR calc non Af Amer: 58 mL/min — ABNORMAL LOW (ref >90)
Glucose, Bld: 130 mg/dL — ABNORMAL HIGH (ref 70–99)
PHOSPHORUS: 2.5 mg/dL (ref 2.3–4.6)
Potassium: 3.2 mmol/L — ABNORMAL LOW (ref 3.5–5.1)
SODIUM: 137 mmol/L (ref 135–145)

## 2014-06-28 LAB — GLUCOSE, CAPILLARY
GLUCOSE-CAPILLARY: 122 mg/dL — AB (ref 70–99)
Glucose-Capillary: 232 mg/dL — ABNORMAL HIGH (ref 70–99)
Glucose-Capillary: 176 mg/dL — ABNORMAL HIGH (ref 70–99)
Glucose-Capillary: 260 mg/dL — ABNORMAL HIGH (ref 70–99)

## 2014-06-28 LAB — CBC
HCT: 27 % — ABNORMAL LOW (ref 36.0–46.0)
Hemoglobin: 9.4 g/dL — ABNORMAL LOW (ref 12.0–15.0)
MCH: 29.7 pg (ref 26.0–34.0)
MCHC: 34.8 g/dL (ref 30.0–36.0)
MCV: 85.2 fL (ref 78.0–100.0)
PLATELETS: 131 10*3/uL — AB (ref 150–400)
RBC: 3.17 MIL/uL — AB (ref 3.87–5.11)
RDW: 13.4 % (ref 11.5–15.5)
WBC: 9.8 10*3/uL (ref 4.0–10.5)

## 2014-06-28 LAB — CALCIUM, URINE, RANDOM: Calcium, Ur: 7 mg/dL

## 2014-06-28 LAB — PHOSPHORUS: PHOSPHORUS: 2.5 mg/dL (ref 2.3–4.6)

## 2014-06-28 LAB — PHOSPHORUS, URINE, RANDOM: Phosphorus, Ur: 15.7 mg/dL

## 2014-06-28 LAB — MAGNESIUM: MAGNESIUM: 1.1 mg/dL — AB (ref 1.5–2.5)

## 2014-06-28 MED ORDER — CALCITRIOL 0.25 MCG PO CAPS
0.5000 ug | ORAL_CAPSULE | Freq: Every day | ORAL | Status: DC
  Administered 2014-06-28 – 2014-06-30 (×3): 0.5 ug via ORAL
  Filled 2014-06-28 (×3): qty 2

## 2014-06-28 MED ORDER — TAB-A-VITE/IRON PO TABS
1.0000 | ORAL_TABLET | Freq: Every day | ORAL | Status: DC
  Administered 2014-06-28 – 2014-06-30 (×3): 1 via ORAL
  Filled 2014-06-28 (×4): qty 1

## 2014-06-28 MED ORDER — CIPROFLOXACIN HCL 250 MG PO TABS
500.0000 mg | ORAL_TABLET | Freq: Two times a day (BID) | ORAL | Status: DC
  Administered 2014-06-28 – 2014-06-30 (×4): 500 mg via ORAL
  Filled 2014-06-28 (×4): qty 2

## 2014-06-28 MED ORDER — ACETAMINOPHEN 650 MG RE SUPP: 650.0000 mg | Freq: Four times a day (QID) | RECTAL | Status: DC | PRN

## 2014-06-28 MED ORDER — ACETAMINOPHEN 325 MG PO TABS: 325.0000 mg | ORAL_TABLET | Freq: Four times a day (QID) | ORAL | Status: DC | PRN

## 2014-06-28 MED ORDER — OXYCODONE HCL 5 MG PO TABS
5.0000 mg | ORAL_TABLET | ORAL | Status: DC | PRN
  Administered 2014-06-29: 5 mg via ORAL
  Filled 2014-06-28: qty 1

## 2014-06-28 MED ORDER — ACETAMINOPHEN 325 MG RE SUPP: 325.0000 mg | Freq: Four times a day (QID) | RECTAL | Status: DC | PRN

## 2014-06-28 MED ORDER — ACETAMINOPHEN 500 MG PO TABS
500.0000 mg | ORAL_TABLET | Freq: Three times a day (TID) | ORAL | Status: DC
  Administered 2014-06-28 – 2014-06-30 (×7): 500 mg via ORAL
  Filled 2014-06-28 (×7): qty 1

## 2014-06-28 MED ORDER — CARVEDILOL 12.5 MG PO TABS
25.0000 mg | ORAL_TABLET | Freq: Two times a day (BID) | ORAL | Status: DC
  Administered 2014-06-28 – 2014-06-30 (×4): 25 mg via ORAL
  Filled 2014-06-28 (×4): qty 2

## 2014-06-28 MED ORDER — METHYLPREDNISOLONE SODIUM SUCC 40 MG IJ SOLR
40.0000 mg | Freq: Once | INTRAMUSCULAR | Status: AC
  Administered 2014-06-28: 40 mg via INTRAVENOUS
  Filled 2014-06-28: qty 1

## 2014-06-28 MED ORDER — POTASSIUM CHLORIDE CRYS ER 20 MEQ PO TBCR
40.0000 meq | EXTENDED_RELEASE_TABLET | Freq: Two times a day (BID) | ORAL | Status: DC
  Administered 2014-06-28 – 2014-06-29 (×3): 40 meq via ORAL
  Filled 2014-06-28 (×3): qty 2

## 2014-06-28 MED ORDER — CALCIUM CARBONATE 1250 MG/5ML PO SUSP
1000.0000 mg | Freq: Two times a day (BID) | ORAL | Status: DC
  Administered 2014-06-28 – 2014-06-29 (×2): 1000 mg via ORAL
  Filled 2014-06-28 (×6): qty 10

## 2014-06-28 MED ORDER — MAGNESIUM SULFATE 50 % IJ SOLN
3.0000 g | Freq: Once | INTRAMUSCULAR | Status: AC
  Administered 2014-06-28: 3 g via INTRAVENOUS
  Filled 2014-06-28: qty 6

## 2014-06-28 MED ORDER — DICLOFENAC SODIUM 1 % TD GEL
2.0000 g | Freq: Two times a day (BID) | TRANSDERMAL | Status: DC
  Administered 2014-06-28 – 2014-06-30 (×5): 2 g via TOPICAL
  Filled 2014-06-28: qty 100

## 2014-06-28 NOTE — Progress Notes (Signed)
Patient on Cipro for Klebsiella UTI.  Renal function improved to patient's baseline.  Will increase Cipro to BID dosing for CrCl >88ml/min per manufacturer dose recommendations.    Netta Cedars, PharmD, BCPS 06/28/2014@4 :17 PM

## 2014-06-28 NOTE — Progress Notes (Signed)
CRITICAL VALUE ALERT  Critical value received:  Calcium  Date of notification:  06/28/2013  Time of notification:  06:57  Critical value read back:yes  Nurse who received alert:  Stephannie Peters  MD notified (1st page):  Dr. Caryn Section  Time of first page:  07:03  MD notified (2nd page): Dr. Caryn Section  Time of second page: 07:29  Responding MD:  No response  Time MD responded:  Relayed value to daytime nurse during report

## 2014-06-28 NOTE — Progress Notes (Signed)
Subjective: Interval History: has no complaint of nausea or vomiting. Patient also denies any diarrhea. She is feeling much better..  Objective: Vital signs in last 24 hours: Temp:  [98 F (36.7 C)-98.8 F (37.1 C)] 98.5 F (36.9 C) (01/08 0624) Pulse Rate:  [89-99] 99 (01/08 0624) Resp:  [18-20] 18 (01/08 0624) BP: (131-168)/(59-80) 152/74 mmHg (01/08 0650) SpO2:  [91 %-95 %] 91 % (01/08 0731) Weight:  [54.522 kg (120 lb 3.2 oz)] 54.522 kg (120 lb 3.2 oz) (01/08 0624) Weight change: 1.315 kg (2 lb 14.4 oz)  Intake/Output from previous day: 01/07 0701 - 01/08 0700 In: 2967.1 [P.O.:240; I.V.:2472.5; IV Piggyback:254.6] Out: 2400 [Urine:2400] Intake/Output this shift:    General appearance: alert, cooperative and no distress Resp: clear to auscultation bilaterally Cardio: regular rate and rhythm, S1, S2 normal, no murmur, click, rub or gallop GI: soft, non-tender; bowel sounds normal; no masses,  no organomegaly Extremities: extremities normal, atraumatic, no cyanosis or edema  Lab Results:  Recent Labs  06/27/14 0602 06/28/14 0605  WBC 10.1 9.8  HGB 9.8* 9.4*  HCT 28.2* 27.0*  PLT 145* 131*   BMET:  Recent Labs  06/27/14 0602 06/28/14 0605  NA 140 137  K 3.2* 3.2*  CL 110 104  CO2 23 25  GLUCOSE 119* 130*  BUN 25* 18  CREATININE 1.15* 0.92  CALCIUM 6.1* 5.6*   No results for input(s): PTH in the last 72 hours. Iron Studies: No results for input(s): IRON, TIBC, TRANSFERRIN, FERRITIN in the last 72 hours.  Studies/Results: Dg Chest 2 View  06/26/2014   CLINICAL DATA:  Wheezing and shortness of breath.  EXAM: CHEST  2 VIEW  COMPARISON:  06/22/2014 acute abdomen series.  FINDINGS: Mild hyperinflation. Midline trachea. Normal heart size. Atherosclerosis in the transverse aorta. Trace left pleural fluid . No pneumothorax. No congestive failure. Diffuse peribronchial thickening. Mild hyperinflation. Linear opacity radiating from the right hilum is likely due to  subsegmental atelectasis. New since the prior.  IMPRESSION: COPD/chronic bronchitis.  No acute superimposed process.  Trace left pleural fluid is similar.   Electronically Signed   By: Abigail Miyamoto M.D.   On: 06/26/2014 15:09    I have reviewed the patient's current medications.  Assessment/Plan: Problem #1 acute kidney injury: Her BUN and creatinine has normalized. And patient has this moment is asymptomatic. Problem #2 hypokalemia: Patient on potassium supplement and potassium still remains low. Problem #3 hypophosphatemia: Her phosphorus has corrected her and patient will phosphorus supplement. Problem #4 hypomagnesemia: Magnesium has improved but remains low. Problem #5 hypocalcemia  Problem #6 history of diabetes Problem #7 hypertension: Her blood pressure seems to be reasonably controlled Problem #8 history of gastroenteritis: Has improved Plan: 1]Will give patient magnesium sulfate 3 g IV 2] We'll start patient on Rocaltrol 0.5 g by mouth once a day 3] We'll start patient on calcium carbonate suspension 1000 mg by mouth twice a day 4] We'll check 24-hour urine for calcium. 5] We'll check her basic metabolic panel, phosphorus, magnesium is the morning. 6] We'll start patient on KCl 40 mEq by mouth twice a day 7] We'll decrease her IV fluid to 50 mL per hour.   LOS: 6 days   Ariana Shea S 06/28/2014,11:47 AM

## 2014-06-28 NOTE — Progress Notes (Addendum)
[facility] PROGRESS NOTE  Ariana Shea IOE:[id number] DOB: 1933-12-09 DOA: 06/22/2014 PCP: Stephens Shire, MD    Code Status: Full code Family Communication: Discussed with son and daughter-in-law Disposition Plan: Discharge to skilled nursing facility when clinically appropriate   Consultants:  Nephrology  Procedures:  None  Antibiotics:  Cipro  Rocephin, discontinued  HPI/Subjective: The patient complains of right hip pain and generalized muscle soreness which she attributes to the uncomfortable bed and recliner. She denies any recent falls or trauma.  Objective: Filed Vitals:   06/28/14 0650  BP: 152/74  Pulse:   Temp:   Resp:    temperature 98.5. Pulse 99. Respiratory rate 18. Blood pressure 152/74. Oxygen saturation 91%.   Intake/Output Summary (Last 24 hours) at 06/28/14 1210 Last data filed at 06/28/14 1829  Gross per 24 hour  Intake 2967.05 ml  Output   2400 ml  Net 567.05 ml   Filed Weights   06/26/14 0522 06/27/14 0651 06/28/14 0624  Weight: 57.1 kg (125 lb 14.1 oz) 53.207 kg (117 lb 4.8 oz) 54.522 kg (120 lb 3.2 oz)    Exam:   General:  81 year old woman laying in bed, in no acute distress.  Cardiovascular: S1, S2, with occasional ectopy.  Respiratory:  Clear to auscultation with no audible wheezes or crackles.  Abdomen: Positive bowel sounds, soft, nontender, nondistended.  Musculoskeletal/extremities: No acute hot joints. Mild to discomfort with internal and external radiation of hip. Pedal pulses palpable. No pedal edema.  Neurologic: She is alert and oriented 3. Cranial nerves II through XII are grossly intact.   Data Reviewed: Basic Metabolic Panel:  Recent Labs Lab 06/23/14 0802 06/24/14 9371 06/25/14 6967 06/26/14 0602 06/27/14 0602 06/27/14 1225 06/28/14 0605  NA  --  137 143 139 140  --  137  K  --  2.6* 2.6* 3.1* 3.2*  --  3.2*  CL  --  110 117* 113* 110  --  104  CO2  --  17* 16* 16* 23  --  25  GLUCOSE  --   197* 117* 93 119*  --  130*  BUN  --  98* 67* 44* 25*  --  18  CREATININE  --  3.34* 2.05* 1.50* 1.15*  --  0.92  CALCIUM  --  6.2* 5.9* 6.3* 6.1*  --  5.6*  MG 1.0*  --  0.9* 0.9* 0.9*  --  1.1*  PHOS  --   --   --  1.6* <1.0* 0.9* 2.5  2.5   Liver Function Tests:  Recent Labs Lab 06/22/14 1411 06/26/14 0602 06/27/14 0602 06/28/14 0605  AST 15  --   --   --   ALT 17  --   --   --   ALKPHOS 54  --   --   --   BILITOT 0.7  --   --   --   PROT 5.2*  --   --   --   ALBUMIN 2.7* 2.1* 2.1* 2.2*   No results for input(s): LIPASE, AMYLASE in the last 168 hours. No results for input(s): AMMONIA in the last 168 hours. CBC:  Recent Labs Lab 06/22/14 1215  06/24/14 8938 06/25/14 0623 06/26/14 0602 06/27/14 0602 06/28/14 0605  WBC 7.2  < > 11.0* 13.4* 14.4* 10.1 9.8  NEUTROABS 6.2  --   --   --   --   --   --   HGB 11.0*  < > 10.4* 9.6* 9.8* 9.8* 9.4*  HCT 31.3*  < >  29.4* 27.1* 28.1* 28.2* 27.0*  MCV 86.0  < > 83.8 84.4 84.9 84.9 85.2  PLT 161  < > 155 136* 145* 145* 131*  < > = values in this interval not displayed. Cardiac Enzymes: No results for input(s): CKTOTAL, CKMB, CKMBINDEX, TROPONINI in the last 168 hours. BNP (last 3 results) No results for input(s): PROBNP in the last 8760 hours. CBG:  Recent Labs Lab 06/27/14 1114 06/27/14 1645 06/27/14 2043 06/28/14 0752 06/28/14 1135  GLUCAP 183* 180* 223* 122* 232*    Recent Results (from the past 240 hour(s))  Urine culture     Status: None   Collection Time: 06/22/14  1:06 PM  Result Value Ref Range Status   Specimen Description URINE, CATHETERIZED  Final   Special Requests IMMUNE:COMPROMISED  Final   Colony Count   Final    >=100,000 COLONIES/ML Performed at [facility]   Final    KLEBSIELLA PNEUMONIAE Performed at [facility]    Report Status 06/25/2014 FINAL  Final   Organism ID, Bacteria KLEBSIELLA PNEUMONIAE  Final      Susceptibility   Klebsiella pneumoniae - MIC*     AMPICILLIN >=32 RESISTANT Resistant     CEFAZOLIN <=4 SENSITIVE Sensitive     CEFTRIAXONE <=1 SENSITIVE Sensitive     CIPROFLOXACIN <=0.25 SENSITIVE Sensitive     GENTAMICIN <=1 SENSITIVE Sensitive     LEVOFLOXACIN <=0.12 SENSITIVE Sensitive     NITROFURANTOIN <=16 SENSITIVE Sensitive     TOBRAMYCIN <=1 SENSITIVE Sensitive     TRIMETH/SULFA <=20 SENSITIVE Sensitive     PIP/TAZO <=4 SENSITIVE Sensitive     * KLEBSIELLA PNEUMONIAE     Studies: Dg Chest 2 View  06/26/2014   CLINICAL DATA:  Wheezing and shortness of breath.  EXAM: CHEST  2 VIEW  COMPARISON:  06/22/2014 acute abdomen series.  FINDINGS: Mild hyperinflation. Midline trachea. Normal heart size. Atherosclerosis in the transverse aorta. Trace left pleural fluid . No pneumothorax. No congestive failure. Diffuse peribronchial thickening. Mild hyperinflation. Linear opacity radiating from the right hilum is likely due to subsegmental atelectasis. New since the prior.  IMPRESSION: COPD/chronic bronchitis.  No acute superimposed process.  Trace left pleural fluid is similar.   Electronically Signed   By: Abigail Miyamoto M.D.   On: 06/26/2014 15:09    Scheduled Meds: . calcitRIOL  0.5 mcg Oral Daily  . calcium carbonate (dosed in mg elemental calcium)  1,000 mg of elemental calcium Oral BID  . carvedilol  12.5 mg Oral BID WC  . cilostazol  50 mg Oral BID  . ciprofloxacin  500 mg Oral Q18H  . feeding supplement (ENSURE COMPLETE)  237 mL Oral BID BM  . insulin aspart  0-9 Units Subcutaneous TID WC  . levalbuterol  0.63 mg Nebulization Q6H  . magnesium sulfate LVP 250-500 ml  3 g Intravenous Once  . pantoprazole  40 mg Oral Daily  . phosphorus  500 mg Oral TID  . potassium chloride  40 mEq Oral BID  . pravastatin  80 mg Oral q morning - 10a   Continuous Infusions: . sodium chloride 0.45 % 1,000 mL with potassium chloride 40 mEq, sodium bicarbonate 50 mEq infusion 75 mL/hr at 06/27/14 2036   Assessment and plan:  Principal Problem:    Nausea vomiting and diarrhea Active Problems:   ARF (acute renal failure)   Hypomagnesemia   Hypocalcemia   Metabolic acidosis   Hypophosphatemia   Hypokalemia   UTI (urinary  tract infection)   Hydronephrosis of right kidney   Renal stones   Hypertension   DM (diabetes mellitus), type 2 with peripheral vascular complications   PVD (peripheral vascular disease)   GERD (gastroesophageal reflux disease)   Pure hypercholesterolemia   1. Nausea, vomiting, and diarrhea. Etiology likely secondary to acute gastroenteritis with UTI possibly contributing. Also considered diarrhea from metformin. Her symptoms have subsided and she is eating better. We'll continue supportive treatment and gentle IV fluids.   Acute renal failure, likely secondary to prerenal azotemia, but cannot rule out obstructive uropathy and/or a UTI as etiologies. Her baseline creatinine is 1.2; it was 8.91 on admission. CT renal stone study revealed mild right hydronephrosis and right sided renal stones; suspicious for transient obstructive uropathy. No evidence of obstruction currently, but with chronic UPJ stenosis. Her creatinine is back to better than baseline. Her urine output is nonoliguric. We'll discontinue Foley catheter.  Metabolic acidosis. Etiology is unclear as her lactic acid level and salicylate levels were within normal limits. Could be secondary to acute renal failure, but her CO2 did not improve with IV fluid hydration with normal saline only,  but has normalized with bicarbonate added to the IV fluids. Query renal tubular acidosis.  Electrolyte abnomalities including hypomagnesemia, hypokalemia, hypocalcemia, and hypophosphatemia. The patient received IV calcium several days ago with minimal improvement. Her phosphorus level was also low. Ordered K-Phos 3 times a day. Neurology consulted and ordered IV phosphorus and now her phosphorus is better. Corrected calcium based on albumin level is less than  7.8, mildly low. Nephrology plans to order calcium supplementation. 2 g of magnesium sulfate given on 1/6; another 4 g given on 1/7. Serum magnesium better, but still needs further repletion. supplementation. Additional 3 g ordered per nephrology. Potassium added to IV fluids; oral supplementation started. Further recommendations per nephrology.  Klebsiella urinary tract infection. Rocephin discontinued in favor of Cipro.  Hypertension. Her blood pressure has improved with the restart of carvedilol. Will titrate back up to home dose.  Type 2 diabetes mellitus with peripheral vascular complications. Metformin is on hold and she is being treated with sliding scale NovoLog. CBGs are currently stable and/or controlled. Hemoglobin A1c was 6.1. Given the patient's recent diarrhea and metabolic derangements, would recommend Lantus or Levemir once or twice daily for long-term treatment.  Normocytic anemia. This appears to be chronic per chart review. She had a colonoscopy a few years ago which revealed colon polyps. Will add multivitamin with iron.  Wheezes/pulmonary crackles , likely secondary to mild COPD exacerbation. Chest x-ray on 1/6, revealed no pulmonary edema , but COPD. Continue bronchodilators.  Generalized muscle aches/right hip pain (chronic.) Will start Tylenol 3 times a day and as needed oxycodone. We'll also give her 1 dose of IV Solu-Medrol. I have asked the nursing staff to provide strategies for comfort while the patient is supine in bed or sitting up in the recliner.      Time spent:  30 minutes    [facility] Pager [phone]. If 7PM-7AM, please contact night-coverage at [email or web address], password [facility] 06/28/2014, 12:10 PM  LOS: 6 days

## 2014-06-28 NOTE — Clinical Social Work Note (Signed)
CSW updated [facility] on pt. Facility remains willing to accept pt and can take her over weekend.   Ariana Shea, [place]

## 2014-06-29 LAB — RENAL FUNCTION PANEL
Albumin: 2.3 g/dL — ABNORMAL LOW (ref 3.5–5.2)
Anion gap: 8 (ref 5–15)
BUN: 17 mg/dL (ref 6–23)
CALCIUM: 5.7 mg/dL — AB (ref 8.4–10.5)
CO2: 26 mmol/L (ref 19–32)
CREATININE: 0.98 mg/dL (ref 0.50–1.10)
Chloride: 104 mEq/L (ref 96–112)
GFR calc Af Amer: 62 mL/min — ABNORMAL LOW (ref >90)
GFR, EST NON AFRICAN AMERICAN: 54 mL/min — AB (ref >90)
Glucose, Bld: 192 mg/dL — ABNORMAL HIGH (ref 70–99)
Phosphorus: 3.2 mg/dL (ref 2.3–4.6)
Potassium: 4.5 mmol/L (ref 3.5–5.1)
Sodium: 138 mmol/L (ref 135–145)

## 2014-06-29 LAB — GLUCOSE, CAPILLARY
GLUCOSE-CAPILLARY: 160 mg/dL — AB (ref 70–99)
GLUCOSE-CAPILLARY: 144 mg/dL — AB (ref 70–99)
Glucose-Capillary: 161 mg/dL — ABNORMAL HIGH (ref 70–99)
Glucose-Capillary: 151 mg/dL — ABNORMAL HIGH (ref 70–99)

## 2014-06-29 LAB — MAGNESIUM: MAGNESIUM: 1.4 mg/dL — AB (ref 1.5–2.5)

## 2014-06-29 MED ORDER — LEVALBUTEROL HCL 0.63 MG/3ML IN NEBU
0.6300 mg | INHALATION_SOLUTION | Freq: Three times a day (TID) | RESPIRATORY_TRACT | Status: DC
  Administered 2014-06-29: 0.63 mg via RESPIRATORY_TRACT
  Filled 2014-06-29: qty 3

## 2014-06-29 MED ORDER — LEVALBUTEROL HCL 0.63 MG/3ML IN NEBU
0.6300 mg | INHALATION_SOLUTION | Freq: Four times a day (QID) | RESPIRATORY_TRACT | Status: DC
  Administered 2014-06-29 (×2): 0.63 mg via RESPIRATORY_TRACT
  Filled 2014-06-29 (×2): qty 3

## 2014-06-29 MED ORDER — CALCIUM CARBONATE 1250 MG/5ML PO SUSP
1000.0000 mg | Freq: Three times a day (TID) | ORAL | Status: DC
  Administered 2014-06-29 – 2014-06-30 (×3): 1000 mg via ORAL
  Filled 2014-06-29 (×6): qty 10

## 2014-06-29 MED ORDER — MAGNESIUM SULFATE 50 % IJ SOLN: 2.0000 g | Freq: Once | INTRAVENOUS | Status: DC

## 2014-06-29 MED ORDER — MAGNESIUM SULFATE 2 GM/50ML IV SOLN
2.0000 g | Freq: Once | INTRAVENOUS | Status: AC
Start: 2014-06-29 — End: 2014-06-29
  Administered 2014-06-29: 2 g via INTRAVENOUS
  Filled 2014-06-29: qty 50

## 2014-06-29 NOTE — Progress Notes (Signed)
Subjective: Interval History: Patient offers no complaint and feeling better  Objective: Vital signs in last 24 hours: Temp:  [98.1 F (36.7 C)-98.4 F (36.9 C)] 98.4 F (36.9 C) (01/09 0546) Pulse Rate:  [72-85] 79 (01/09 0546) Resp:  [20] 20 (01/09 0546) BP: (121-157)/(57-78) 157/78 mmHg (01/09 0546) SpO2:  [84 %-96 %] 96 % (01/09 0619) Weight:  [54.341 kg (119 lb 12.8 oz)] 54.341 kg (119 lb 12.8 oz) (01/09 0655) Weight change: -0.181 kg (-6.4 oz)  Intake/Output from previous day: 01/08 0701 - 01/09 0700 In: 2939.6 [P.O.:720; I.V.:2219.6] Out: 2550 [Urine:2550] Intake/Output this shift:    General appearance: alert, cooperative and no distress Resp: clear to auscultation bilaterally Cardio: regular rate and rhythm, S1, S2 normal, no murmur, click, rub or gallop GI: soft, non-tender; bowel sounds normal; no masses,  no organomegaly Extremities: extremities normal, atraumatic, no cyanosis or edema  Lab Results:  Recent Labs  06/27/14 0602 06/28/14 0605  WBC 10.1 9.8  HGB 9.8* 9.4*  HCT 28.2* 27.0*  PLT 145* 131*   BMET:   Recent Labs  06/28/14 0605 06/29/14 0603  NA 137 138  K 3.2* 4.5  CL 104 104  CO2 25 26  GLUCOSE 130* 192*  BUN 18 17  CREATININE 0.92 0.98  CALCIUM 5.6* 5.7*   No results for input(s): PTH in the last 72 hours. Iron Studies: No results for input(s): IRON, TIBC, TRANSFERRIN, FERRITIN in the last 72 hours.  Studies/Results: No results found.  I have reviewed the patient's current medications.  Assessment/Plan: Problem #1 acute kidney injury: Her renal function has recovered. Problem #2 hypokalemia: Patient on potassium supplement and her potassium is normal. Problem #3 hypophosphatemia: Her phosphorus has corrected her and patient will phosphorus supplement. Problem #4 hypomagnesemia: Magnesium is pending today. Problem #5 hypocalcemia: Calcium is low but improving. Patient presently on calcium supplement orally.  Problem #6  history of diabetes Problem #7 hypertension: Her blood pressure seems to be reasonably controlled Problem #8 history of gastroenteritis: Has improved Plan: 1] we'll DC IV fluid and potassium supplement 2] We'll DC Neutra-Phos 3]We'll increase her calcium carbonate to 3 times a day 4]We'll check her basic metabolic panel, phosphorus and magnesium in the morning.   LOS: 7 days   Devante Capano S 06/29/2014,9:38 AM

## 2014-06-29 NOTE — Progress Notes (Signed)
[facility] PROGRESS NOTE  VELITA QUIRK XTG:[id number] DOB: 01-15-1934 DOA: 06/22/2014 PCP: Stephens Shire, MD    Code Status: Full code Family Communication: Discussed with son and daughter-in-law on 06/28/14 Disposition Plan: Discharge to skilled nursing facility when clinically appropriate   Consultants:  Nephrology  Procedures:  None  Antibiotics:  Cipro>>  Rocephin, discontinued  HPI/Subjective: The patient feels better today. She has less muscle soreness and less hip pain. No complaints of shortness of breath or chest congestion.  Objective: Filed Vitals:   06/29/14 1500  BP: 155/75  Pulse: 80  Temp: 98.2 F (36.8 C)  Resp: 20   oxygen saturation 94%.   Intake/Output Summary (Last 24 hours) at 06/29/14 1620 Last data filed at 06/29/14 1501  Gross per 24 hour  Intake 2939.58 ml  Output   2400 ml  Net 539.58 ml   Filed Weights   06/27/14 0651 06/28/14 0624 06/29/14 0655  Weight: 53.207 kg (117 lb 4.8 oz) 54.522 kg (120 lb 3.2 oz) 54.341 kg (119 lb 12.8 oz)    Exam:   General:  81 year old woman laying in bed, in no acute distress.  Cardiovascular: S1, S2, with occasional ectopy.  Respiratory:  Clear to auscultation with no audible wheezes or crackles.  Abdomen: Positive bowel sounds, soft, nontender, nondistended.  Musculoskeletal/extremities: No acute hot joints. Mild to discomfort with internal and external radiation of hip. Pedal pulses palpable. No pedal edema.  Neurologic: She is alert and oriented 3. Cranial nerves II through XII are grossly intact.   Data Reviewed: Basic Metabolic Panel:  Recent Labs Lab 06/25/14 0623 06/26/14 0602 06/27/14 0602 06/27/14 1225 06/28/14 0605 06/29/14 0603 06/29/14 1229  NA 143 139 140  --  137 138  --   K 2.6* 3.1* 3.2*  --  3.2* 4.5  --   CL 117* 113* 110  --  104 104  --   CO2 16* 16* 23  --  25 26  --   GLUCOSE 117* 93 119*  --  130* 192*  --   BUN 67* 44* 25*  --  18 17  --    CREATININE 2.05* 1.50* 1.15*  --  0.92 0.98  --   CALCIUM 5.9* 6.3* 6.1*  --  5.6* 5.7*  --   MG 0.9* 0.9* 0.9*  --  1.1*  --  1.4*  PHOS  --  1.6* <1.0* 0.9* 2.5  2.5 3.2  --    Liver Function Tests:  Recent Labs Lab 06/26/14 0602 06/27/14 0602 06/28/14 0605 06/29/14 0603  ALBUMIN 2.1* 2.1* 2.2* 2.3*   No results for input(s): LIPASE, AMYLASE in the last 168 hours. No results for input(s): AMMONIA in the last 168 hours. CBC:  Recent Labs Lab 06/24/14 0623 06/25/14 0623 06/26/14 0602 06/27/14 0602 06/28/14 0605  WBC 11.0* 13.4* 14.4* 10.1 9.8  HGB 10.4* 9.6* 9.8* 9.8* 9.4*  HCT 29.4* 27.1* 28.1* 28.2* 27.0*  MCV 83.8 84.4 84.9 84.9 85.2  PLT 155 136* 145* 145* 131*   Cardiac Enzymes: No results for input(s): CKTOTAL, CKMB, CKMBINDEX, TROPONINI in the last 168 hours. BNP (last 3 results) No results for input(s): PROBNP in the last 8760 hours. CBG:  Recent Labs Lab 06/28/14 1702 06/28/14 2054 06/29/14 0751 06/29/14 1121 06/29/14 1617  GLUCAP 176* 260* 161* 160* 151*    Recent Results (from the past 240 hour(s))  Urine culture     Status: None   Collection Time: 06/22/14  1:06 PM  Result Value Ref Range  Status   Specimen Description URINE, CATHETERIZED  Final   Special Requests IMMUNE:COMPROMISED  Final   Colony Count   Final    >=100,000 COLONIES/ML Performed at [facility]    Culture   Final    KLEBSIELLA PNEUMONIAE Performed at [facility]    Report Status 06/25/2014 FINAL  Final   Organism ID, Bacteria KLEBSIELLA PNEUMONIAE  Final      Susceptibility   Klebsiella pneumoniae - MIC*    AMPICILLIN >=32 RESISTANT Resistant     CEFAZOLIN <=4 SENSITIVE Sensitive     CEFTRIAXONE <=1 SENSITIVE Sensitive     CIPROFLOXACIN <=0.25 SENSITIVE Sensitive     GENTAMICIN <=1 SENSITIVE Sensitive     LEVOFLOXACIN <=0.12 SENSITIVE Sensitive     NITROFURANTOIN <=16 SENSITIVE Sensitive     TOBRAMYCIN <=1 SENSITIVE Sensitive     TRIMETH/SULFA  <=20 SENSITIVE Sensitive     PIP/TAZO <=4 SENSITIVE Sensitive     * KLEBSIELLA PNEUMONIAE     Studies: No results found.  Scheduled Meds: . acetaminophen  500 mg Oral TID  . calcitRIOL  0.5 mcg Oral Daily  . calcium carbonate (dosed in mg elemental calcium)  1,000 mg of elemental calcium Oral TID  . carvedilol  25 mg Oral BID WC  . cilostazol  50 mg Oral BID  . ciprofloxacin  500 mg Oral Q12H  . diclofenac sodium  2 g Topical BID  . feeding supplement (ENSURE COMPLETE)  237 mL Oral BID BM  . insulin aspart  0-9 Units Subcutaneous TID WC  . levalbuterol  0.63 mg Nebulization QID  . multivitamins with iron  1 tablet Oral Q lunch  . pantoprazole  40 mg Oral Daily  . pravastatin  80 mg Oral q morning - 10a   Continuous Infusions:   Assessment and plan:  Principal Problem:   Nausea vomiting and diarrhea Active Problems:   ARF (acute renal failure)   Hypomagnesemia   Hypocalcemia   Metabolic acidosis   Hypophosphatemia   Hypokalemia   UTI (urinary tract infection)   Hydronephrosis of right kidney   Renal stones   Hypertension   DM (diabetes mellitus), type 2 with peripheral vascular complications   PVD (peripheral vascular disease)   GERD (gastroesophageal reflux disease)   Pure hypercholesterolemia   Normocytic anemia   1. Nausea, vomiting, and diarrhea. Etiology likely secondary to acute gastroenteritis with UTI possibly contributing. Also considered diarrhea from metformin. Her symptoms have subsided and she is eating better. We'll continue supportive treatment. Have discontinued IV fluids.   Acute renal failure, likely secondary to prerenal azotemia, but cannot rule out obstructive uropathy and/or a UTI as etiologies. Her baseline creatinine is 1.2; it was 8.91 on admission. CT renal stone study revealed mild right hydronephrosis and right sided renal stones; suspicious for transient obstructive uropathy. No evidence of obstruction currently, but with chronic UPJ  stenosis. Her creatinine is back to better than baseline. Her urine output is nonoliguric. Foley catheter discontinued and she is urinating without any difficulty.  Metabolic acidosis. Etiology is unclear as her lactic acid level and salicylate levels were within normal limits. Could be secondary to acute renal failure, but her CO2 did not improve with IV fluid hydration with normal saline only,  but had normalized with bicarbonate added to the IV fluids. Query renal tubular acidosis.  Electrolyte abnomalities including hypomagnesemia, hypokalemia, hypocalcemia, and hypophosphatemia. The patient received IV calcium several days ago with minimal improvement. Her phosphorus level was also low. Ordered was  K-Phos 3 times a day. Neurology consulted and ordered IV phosphorus and now her phosphorus is better. K-Phos was discontinued. Corrected calcium based on albumin level is less than 7.8, mildly low. Nephrology ordered and increase the dosing of calcium supplementation. 2 g of magnesium sulfate given on 1/6; another 4 g given on 1/7. Serum magnesium better, but still needs further repletion. supplementation. Additional 3 g ordered per nephrology on 06/28/14. We'll order another 2 g of magnesium sulfate today. Potassium added to IV fluids; oral supplementation was given. Serum potassium now within normal limits. Further recommendations per nephrology.  Klebsiella urinary tract infection. Rocephin discontinued in favor of Cipro.  Hypertension. Her blood pressure has improved with the restart of carvedilol. Will titrate back up to home dose.  Type 2 diabetes mellitus with peripheral vascular complications. Metformin is on hold and she is being treated with sliding scale NovoLog. CBGs were well controlled, but increased following the Solu-Medrol. Will continue current management and titrate insulin if needed tomorrow. Hemoglobin A1c was 6.1. Given the patient's recent diarrhea and metabolic  derangements, will recommend Lantus or Levemir once or twice daily for long-term treatment.  Normocytic anemia. This appears to be chronic per chart review. She had a colonoscopy a few years ago which revealed colon polyps. Will add multivitamin with iron.  Wheezes/pulmonary crackles , likely secondary to mild COPD exacerbation. Chest x-ray on 1/6, revealed no pulmonary edema , but COPD. Continue bronchodilators. We'll check oxygen saturations on room air to see if she will require supplemental oxygen.  Generalized muscle aches/right hip pain (chronic.) She is less symptomatic. We'll continue Tylenol 3 times a day and as needed oxycodone. Status post 1 dose of IV Solu-Medrol. I have asked the nursing staff to provide strategies for comfort while the patient is supine in bed or sitting up in the recliner.  Deconditioned. She will be discharged to [facility] skilled nursing facility for rehabilitation when medically appropriate.    Time spent:  30 minutes    [facility] Pager [phone]. If 7PM-7AM, please contact night-coverage at [email or web address], password [facility] 06/29/2014, 4:20 PM  LOS: 7 days

## 2014-06-30 ENCOUNTER — Encounter (HOSPITAL_COMMUNITY): Payer: Self-pay | Admitting: Internal Medicine

## 2014-06-30 DIAGNOSIS — I714 Abdominal aortic aneurysm, without rupture, unspecified: Secondary | ICD-10-CM

## 2014-06-30 HISTORY — DX: Abdominal aortic aneurysm, without rupture: I71.4

## 2014-06-30 HISTORY — DX: Abdominal aortic aneurysm, without rupture, unspecified: I71.40

## 2014-06-30 LAB — GLUCOSE, CAPILLARY
Glucose-Capillary: 94 mg/dL (ref 70–99)
Glucose-Capillary: 111 mg/dL — ABNORMAL HIGH (ref 70–99)

## 2014-06-30 LAB — BASIC METABOLIC PANEL
Anion gap: 7 (ref 5–15)
BUN: 18 mg/dL (ref 6–23)
CO2: 24 mmol/L (ref 19–32)
CREATININE: 1.06 mg/dL (ref 0.50–1.10)
Calcium: 6.7 mg/dL — ABNORMAL LOW (ref 8.4–10.5)
Chloride: 106 mEq/L (ref 96–112)
GFR calc Af Amer: 57 mL/min — ABNORMAL LOW (ref >90)
GFR calc non Af Amer: 49 mL/min — ABNORMAL LOW (ref >90)
Glucose, Bld: 96 mg/dL (ref 70–99)
Potassium: 4.4 mmol/L (ref 3.5–5.1)
SODIUM: 137 mmol/L (ref 135–145)

## 2014-06-30 LAB — PHOSPHORUS: PHOSPHORUS: 3.3 mg/dL (ref 2.3–4.6)

## 2014-06-30 LAB — MAGNESIUM: Magnesium: 1.3 mg/dL — ABNORMAL LOW (ref 1.5–2.5)

## 2014-06-30 MED ORDER — TAB-A-VITE/IRON PO TABS: 1.0000 | ORAL_TABLET | Freq: Every day | ORAL | Status: DC

## 2014-06-30 MED ORDER — LISINOPRIL 10 MG PO TABS: 10.0000 mg | ORAL_TABLET | Freq: Every morning | ORAL | Status: DC

## 2014-06-30 MED ORDER — INSULIN GLARGINE 100 UNIT/ML ~~LOC~~ SOLN: 10.0000 [IU] | Freq: Two times a day (BID) | SUBCUTANEOUS | Status: DC

## 2014-06-30 MED ORDER — CALCIUM CARBONATE-VITAMIN D 500-200 MG-UNIT PO TABS: 1.0000 | ORAL_TABLET | Freq: Two times a day (BID) | ORAL | Status: DC

## 2014-06-30 MED ORDER — OXYCODONE HCL 5 MG PO TABS: 5.0000 mg | ORAL_TABLET | ORAL | Status: DC | PRN

## 2014-06-30 MED ORDER — MAGNESIUM OXIDE 400 MG PO TABS: 400.0000 mg | ORAL_TABLET | Freq: Two times a day (BID) | ORAL | Status: DC

## 2014-06-30 MED ORDER — LEVALBUTEROL HCL 0.63 MG/3ML IN NEBU: 0.6300 mg | INHALATION_SOLUTION | Freq: Four times a day (QID) | RESPIRATORY_TRACT | Status: DC | PRN

## 2014-06-30 MED ORDER — LEVALBUTEROL HCL 0.63 MG/3ML IN NEBU: 0.6300 mg | INHALATION_SOLUTION | Freq: Two times a day (BID) | RESPIRATORY_TRACT | Status: DC

## 2014-06-30 MED ORDER — DICLOFENAC SODIUM 1 % TD GEL: 2.0000 g | Freq: Two times a day (BID) | TRANSDERMAL | Status: DC

## 2014-06-30 MED ORDER — ENSURE COMPLETE PO LIQD: 237.0000 mL | Freq: Two times a day (BID) | ORAL | Status: DC

## 2014-06-30 MED ORDER — ACETAMINOPHEN 500 MG PO TABS: 500.0000 mg | ORAL_TABLET | Freq: Three times a day (TID) | ORAL | Status: DC

## 2014-06-30 NOTE — Progress Notes (Signed)
Patient's oxygen saturation was 94% on room air while ambulating to bedside commode. Will continue to monitor.

## 2014-06-30 NOTE — Progress Notes (Signed)
Patient with orders to be discharged to [facility]. Discharge packet with prescription sent with patient. Report called to [place], nurse. Patient stable. Patient transported via grandson in private vehicle.

## 2014-06-30 NOTE — Discharge Summary (Signed)
Physician Discharge Summary  Ariana Shea IPJ:[id number] DOB: [birth year] DOA: 06/22/2014  PCP: Stephens Shire, MD  Admit date: 06/22/2014 Discharge date: 06/30/2014  Time spent: Greater than 30 minutes  Recommendations for Outpatient Follow-up:  1. Recommend checking the patient's blood sugars twice daily. 2. Metformin was discontinued indefinitely due to diarrhea and metabolic derangements. 3. Recommend follow-up of the patient's electrolytes including potassium, calcium, phosphorus, and magnesium (would also order albumin to calculate the corrected calcium). 4. Patient is being discharged to [facility] for rehabilitation.   Discharge Diagnoses:  1. Nausea, vomiting, and diarrhea, secondary to acute gastroenteritis with UTI possibly contributing. 2. Acute renal failure secondary to prerenal azotemia. 3. Nonobstructive right renal calculi. 4. Mild right hydronephrosis secondary to chronic UPJ stenosis. 5. Renal abdominal aortic aneurysm, measuring 3.5 mm. Recommend follow-up ultrasound in 2 years. 6. Metabolic acidosis, secondary to acute renal failure. 7. Electrolyte abnormalities including hypomagnesemia, hypokalemia, hypocalcemia, and hypophosphatemia. 8. Klebsiella urinary tract infection. 9. Type 2 diabetes mellitus with peripheral vascular complications. Hemoglobin A1c was 6.1. 10. Normocytic iron deficiency anemia. 11. Reported history of colon polyps. 12. Essential hypertension. 13. Mild COPD with exacerbation. 14. Hyperlipidemia. 14. Degenerative joint disease.    Discharge Condition: Improved but deconditioned.  Diet recommendation: Carbohydrate modified.  Filed Weights   06/28/14 0624 06/29/14 0655 06/30/14 0525  Weight: 54.522 kg (120 lb 3.2 oz) 54.341 kg (119 lb 12.8 oz) 54.432 kg (120 lb)    History of present illness:  The patient is a 81 year old woman with a history of degenerative joint disease, peripheral vascular disease, hypertension,  and diabetes mellitus, who presented to the emergency department on 06/22/14 with a chief complaint of nausea, vomiting, and diarrhea. In the emergency department, her white blood cell count and hemoglobin were normal, but her BUN was 156, her creatinine was 8.91, and anion gap was 25. Her glucose was 115. Her urinalysis revealed many bacteria and 7-10 WBCs. Her acute abdominal series revealed no acute findings. She was admitted for further evaluation and management.    Hospital Course:   1. Nausea, vomiting, and diarrhea. Etiology likely secondary to acute gastroenteritis with UTI and uremia possibly contributing. The patient was started on vigorous IV fluids. ACE inhibitor was discontinued because of the acute renal failure and metformin was discontinued because of the diarrhea and acidosis.. Rocephin was started for treatment of the urinary tract infection, but was discontinued in favor of Cipro. She had no diarrhea or vomiting during hospitalization, but she did have some nausea which was treated with as needed Zofran. Over the course of the hospitalization, all of her GI symptoms resolved. Her diet was advanced which she tolerated well.  Acute renal failure, likely secondary to prerenal azotemia, but cannot rule out obstructive uropathy and/or a UTI as etiologies. Her baseline creatinine is 1.2, but it was 8.91 on admission. Her renal ultrasound revealed mild right hydronephrosis. CT renal stone study revealed mild right hydronephrosis and right sided renal stones; suspicious for transient obstructive uropathy. There was no evidence of obstruction currently, but with chronic UPJ stenosis. She was started on vigorous IV fluids. Lisinopril was withheld during hospitalization. A Foley catheter was placed for strict ins and outs. Her urine output was nonoliguric. Her renal function progressively improved with IV fluid hydration. At the time of discharge, her BUN was 18 and her creatinine was  1.06.  Metabolic acidosis. Her CO2 (bicarbonate level) was 10 on admission. This was thought to be secondary to uremia or acute renal failure.  Her lactic acid level and salicylate levels were within normal limits. There was no evidence of DKA given her relatively normal venous glucose on admission. Bicarbonate was added to the IV fluids for treatment. Over the course of the hospitalization, her acidosis resolved.  Electrolyte abnomalities including hypomagnesemia, hypokalemia, hypocalcemia, and hypophosphatemia. The patient received IV calcium several days ago with minimal improvement. Her phosphorus level was also low.  K-Phos was ordered 3 times a day. Neurology was consulted and ordered IV phosphorus which resulted in improvement in her serum phosphorus. K-Phos was discontinued. Corrected calcium based on albumin level was less than 7.8, mildly low. Calcium supplements were ordered and the dosing was adjusted by nephrology. Nephrology also ordered calcitriol which was eventually discontinued upon discharge. She was given 2 g of magnesium sulfate given on 1/6; another 4 g given on 1/7. Her serum magnesium improved, but was still low. She was given an additional  3 g ordered per nephrology on 06/28/14 and 2 g on 06/29/14. She was supplemented with potassium added to IV fluids and subsequently oral supplementation was given.  At the time of discharge, her potassium was 4.4, magnesium 1.3, phosphorus 3.3, and calcium 6.7. She was discharged on calcium and magnesium supplements.  Klebsiella urinary tract infection. Rocephin was discontinued in favor of Cipro. She completed a seven-day course of Cipro. She had no complaints of dysuria at the time of discharge. The Foley catheter was discontinued.  Hypertension. Her blood pressure was low normal on admission. Carvedilol and lisinopril were withheld. Her blood pressure improved and became elevated, so carvedilol was restarted. Now that her renal function  has improved, lisinopril was restarted at a lower dose of 10 mg daily, but can be titrated back up to 20 mg-her home dose-with close monitoring of her serum potassium.  Type 2 diabetes mellitus with peripheral vascular complications. Metformin was held and she was started on treatment with sliding scale NovoLog. CBGs were well controlled, but increased following the Solu-Medrol. Her Hemoglobin A1c was 6.1. Given the patient's recent diarrhea and metabolic derangements, will recommend Lantus  twice daily for long-term treatment.  Normocytic anemia. This appeared to have been chronic per chart review. She had a colonoscopy a few years ago which revealed colon polyps, per her account. Iron supplementation and multivitamin with iron were continued.  Wheezes/pulmonary crackles , likely secondary to mild COPD exacerbation. Chest x-ray on 1/6, revealed no pulmonary edema , but COPD. She was started on Xopenex nebulizers with improvement. She was oxygenating 94% on room air prior to discharge. Her wheezing had resolved.  DJD with muscle aches/right hip pain (chronic.) For treatment, the patient was started on Tylenol 3 times a day and as needed oxycodone. She was also given 160 mg dose of Solu-Medrol IV. Topical vault tear and was also added. The nursing staff was asked to provide strategies for comfort while the patient is supine in bed or sitting up in the recliner.  Deconditioned. She will be discharged to [facility] for rehabilitation.  Procedures:  None  Consultations:  Nephrology  Discharge Exam: Filed Vitals:   06/30/14 0525  BP: 148/67  Pulse: 77  Temp: 98 F (36.7 C)  Resp: 20     General: 81 year old woman laying in bed, in no acute distress.  Cardiovascular: S1, S2, with occasional ectopy.  Respiratory: Clear to auscultation with no audible wheezes or crackles.  Abdomen: Positive bowel sounds, soft, nontender,  nondistended.  Musculoskeletal/extremities: No acute hot joints. Mild to discomfort with internal  and external radiation of hip. Pedal pulses palpable. No pedal edema.  Neurologic: She is alert and oriented 3. Cranial nerves II through XII are grossly intact. Discharge Instructions   Discharge Instructions    Diet Carb Modified    Complete by:  As directed      Increase activity slowly    Complete by:  As directed           Current Discharge Medication List    START taking these medications   Details  acetaminophen (TYLENOL) 500 MG tablet Take 1 tablet (500 mg total) by mouth 3 (three) times daily.    calcium-vitamin D (OSCAL WITH D) 500-200 MG-UNIT per tablet Take 1 tablet by mouth 2 (two) times daily.    diclofenac sodium (VOLTAREN) 1 % GEL Apply 2 g topically 2 (two) times daily. APPLY TO RIGHT HIP.    feeding supplement, ENSURE COMPLETE, (ENSURE COMPLETE) LIQD Take 237 mLs by mouth 2 (two) times daily between meals.    insulin glargine (LANTUS) 100 UNIT/ML injection Inject 0.1 mLs (10 Units total) into the skin 2 (two) times daily.    levalbuterol (XOPENEX) 0.63 MG/3ML nebulizer solution Take 3 mLs (0.63 mg total) by nebulization 2 (two) times daily.    magnesium oxide (MAG-OX) 400 MG tablet Take 1 tablet (400 mg total) by mouth 2 (two) times daily.    Multiple Vitamins-Iron (MULTIVITAMINS WITH IRON) TABS tablet Take 1 tablet by mouth daily with lunch.    oxyCODONE (OXY IR/ROXICODONE) 5 MG immediate release tablet Take 1 tablet (5 mg total) by mouth every 4 (four) hours as needed for severe pain. Qty: 30 tablet, Refills: 0      CONTINUE these medications which have CHANGED   Details  lisinopril (PRINIVIL,ZESTRIL) 10 MG tablet Take 1 tablet (10 mg total) by mouth every morning.      CONTINUE these medications which have NOT CHANGED   Details  carvedilol (COREG) 25 MG tablet Take 25 mg by mouth 2 (two) times daily with a meal.    cilostazol (PLETAL) 50 MG tablet  Take 1 tablet (50 mg total) by mouth 2 (two) times daily. Qty: 180 tablet, Refills: 2    iron polysaccharides (FERREX 150) 150 MG capsule Take 150 mg by mouth 2 (two) times daily.     omeprazole (PRILOSEC) 20 MG capsule Take 20 mg by mouth every morning.     pravastatin (PRAVACHOL) 80 MG tablet Take 80 mg by mouth every morning.       STOP taking these medications     metFORMIN (GLUCOPHAGE) 1000 MG tablet        Allergies  Allergen Reactions  . Red Blood Cells Other (See Comments)    Patient is a Jehovah's Witness  . Penicillins Rash  . Sulfonamide Derivatives Rash      The results of significant diagnostics from this hospitalization (including imaging, microbiology, ancillary and laboratory) are listed below for reference.    Significant Diagnostic Studies: Dg Chest 2 View  06/26/2014   CLINICAL DATA:  Wheezing and shortness of breath.  EXAM: CHEST  2 VIEW  COMPARISON:  06/22/2014 acute abdomen series.  FINDINGS: Mild hyperinflation. Midline trachea. Normal heart size. Atherosclerosis in the transverse aorta. Trace left pleural fluid . No pneumothorax. No congestive failure. Diffuse peribronchial thickening. Mild hyperinflation. Linear opacity radiating from the right hilum is likely due to subsegmental atelectasis. New since the prior.  IMPRESSION: COPD/chronic bronchitis.  No acute superimposed process.  Trace left pleural fluid is similar.  Electronically Signed   By: Abigail Miyamoto M.D.   On: 06/26/2014 15:09   US Renal  06/23/2014   CLINICAL DATA:  Acute renal failure, hypertension, diabetes  EXAM: RENAL/URINARY TRACT ULTRASOUND COMPLETE  COMPARISON:  Cough  FINDINGS: Right Kidney:  Length: 13 cm. Mild hydronephrosis. Echogenicity within normal limits. No mass visualized.  Left Kidney:  Length: 14.1 cm. Echogenicity within normal limits. No mass or hydronephrosis visualized.  Bladder:  Decompressed by Foley catheter  IMPRESSION: 1. Mild right hydronephrosis.   Electronically  Signed   By: Arne Cleveland M.D.   On: 06/23/2014 10:17   Dg Abd Acute W/chest  06/22/2014   CLINICAL DATA:  Vomiting, abdominal pain and fever.  EXAM: ACUTE ABDOMEN SERIES (ABDOMEN 2 VIEW & CHEST 1 VIEW)  COMPARISON:  Chest x-ray on 11/28/2013  FINDINGS: Stable chronic left pleural thickening versus small left pleural effusion. There is no evidence of pulmonary edema, consolidation, pneumothorax, nodule or pleural fluid. The heart size is normal.  Abdominal films show no evidence acute bowel obstruction or ileus. No free air is identified. Clips are present related to prior cholecystectomy. Mild degenerative changes present of the spine.  IMPRESSION: No acute findings.   Electronically Signed   By: Aletta Edouard M.D.   On: 06/22/2014 13:45   Ct Renal Stone Study  06/25/2014   CLINICAL DATA:  Acute onset right flank pain. Acute renal failure. Surgical history includes cholecystectomy, hysterectomy and unspecified bladder surgery.  EXAM: CT ABDOMEN AND PELVIS WITHOUT CONTRAST  TECHNIQUE: Multidetector CT imaging of the abdomen and pelvis was performed following the standard protocol without IV contrast.  COMPARISON:  No prior CT.  Urinary tract ultrasound 06/23/2014.  FINDINGS: At least 3 nonobstructing calculi a within mid and lower pole calices of the right kidney, the largest approximating 4 mm. Mild right hydronephrosis without associated ureteral dilation. Mild diffuse cortical thinning involving the right kidney. No left urinary tract calculi. No left hydronephrosis. Perinephric edema surrounding both kidneys. Within the limits of the unenhanced technique, no focal parenchymal abnormality involving either kidney.  Normal unenhanced appearance of the liver. Partially calcified approximate 1.2 x 1.4 x 1.7 cm mass in the otherwise normal-appearing spleen. Normal right adrenal gland. Diffuse enlargement of the left adrenal gland without nodularity. Moderate to severe pancreatic atrophy. Gallbladder  surgically absent. No biliary ductal dilation.  Bilobed infrarenal abdominal aortic aneurysm which extends to the aortic bifurcation, maximum AP diameter 3.5 cm and maximum transverse diameter 3.0 cm. Severe atherosclerosis involving the iliofemoral arteries with an indwelling left common femoral artery stent. Visceral artery atherosclerosis. No significant lymphadenopathy.  Stomach decompressed and unremarkable. Normal-appearing small bowel. Diverticulosis involving the distal descending and sigmoid colon without evidence of acute diverticulitis. Moderate stool burden. Opaque ingested material scattered throughout the small bowel, including the duodenum. Appendix not visualized but no pericecal inflammation.  Pessary device in the pelvis. Urinary bladder decompressed by Foley catheter. Uterus surgically absent. No adnexal masses. Small amount of free fluid in the left low pelvis. Multiple bilateral pelvic phleboliths.  Bone window images demonstrate thoracolumbar scoliosis, lower thoracic spondylosis, calcification within the T11-T12 disc, degenerative disc disease at L3-4 L4-5, and mild degenerative changes in both hips. Small bilateral pleural effusions and associated passive atelectasis in the visualized lower lobes without evidence of pulmonary edema. Heart enlarged with mitral annular calcification and right coronary atherosclerosis. Note is made of edema in the dependent subcutaneous tissues of the back.  IMPRESSION: 1. Nonobstructing calculi within mid and lower pole calices of  the right kidney. 2. Mild right hydronephrosis secondary to chronic UPJ stenosis, as the ureter is not dilated. 3. No acute abnormalities involving the abdomen or pelvis. 4. Bilobed in from a renal abdominal aortic aneurysm, maximum AP diameter 3.5 mm. Please see below for followup recommendations. 5. Distal descending and sigmoid colon diverticulosis without evidence of acute diverticulitis. 6. Small bilateral pleural effusions and  associated mild passive atelectasis in the visualized lower lobes without evidence of pulmonary edema. Recommend followup by [place] in 2 years. This recommendation follows ACR consensus guidelines: White Paper of the ACR Incidental Findings Committee II on Vascular Findings. J Am Coll Radiol 2013; 10:789-794.   Electronically Signed   By: Evangeline Dakin M.D.   On: 06/25/2014 16:37    Microbiology: Recent Results (from the past 240 hour(s))  Urine culture     Status: None   Collection Time: 06/22/14  1:06 PM  Result Value Ref Range Status   Specimen Description URINE, CATHETERIZED  Final   Special Requests IMMUNE:COMPROMISED  Final   Colony Count   Final    >=100,000 COLONIES/ML Performed at [facility]    Culture   Final    KLEBSIELLA PNEUMONIAE Performed at [facility]    Report Status 06/25/2014 FINAL  Final   Organism ID, Bacteria KLEBSIELLA PNEUMONIAE  Final      Susceptibility   Klebsiella pneumoniae - MIC*    AMPICILLIN >=32 RESISTANT Resistant     CEFAZOLIN <=4 SENSITIVE Sensitive     CEFTRIAXONE <=1 SENSITIVE Sensitive     CIPROFLOXACIN <=0.25 SENSITIVE Sensitive     GENTAMICIN <=1 SENSITIVE Sensitive     LEVOFLOXACIN <=0.12 SENSITIVE Sensitive     NITROFURANTOIN <=16 SENSITIVE Sensitive     TOBRAMYCIN <=1 SENSITIVE Sensitive     TRIMETH/SULFA <=20 SENSITIVE Sensitive     PIP/TAZO <=4 SENSITIVE Sensitive     * KLEBSIELLA PNEUMONIAE     Labs: Basic Metabolic Panel:  Recent Labs Lab 06/26/14 0602 06/27/14 0602 06/27/14 1225 06/28/14 4970 06/29/14 0603 06/29/14 1229 06/30/14 0607 06/30/14 0608 06/30/14 0919  NA 139 140  --  137 138  --  137  --   --   K 3.1* 3.2*  --  3.2* 4.5  --  4.4  --   --   CL 113* 110  --  104 104  --  106  --   --   CO2 16* 23  --  25 26  --  24  --   --   GLUCOSE 93 119*  --  130* 192*  --  96  --   --   BUN 44* 25*  --  18 17  --  18  --   --   CREATININE 1.50* 1.15*  --  0.92 0.98  --  1.06  --   --   CALCIUM  6.3* 6.1*  --  5.6* 5.7*  --  6.7*  --   --   MG 0.9* 0.9*  --  1.1*  --  1.4*  --   --  1.3*  PHOS 1.6* <1.0* 0.9* 2.5  2.5 3.2  --   --  3.3  --    Liver Function Tests:  Recent Labs Lab 06/26/14 0602 06/27/14 0602 06/28/14 0605 06/29/14 0603  ALBUMIN 2.1* 2.1* 2.2* 2.3*   No results for input(s): LIPASE, AMYLASE in the last 168 hours. No results for input(s): AMMONIA in the last 168 hours. CBC:  Recent Labs Lab 06/24/14 [phone]  06/25/14 1518 06/26/14 0602 06/27/14 0602 06/28/14 0605  WBC 11.0* 13.4* 14.4* 10.1 9.8  HGB 10.4* 9.6* 9.8* 9.8* 9.4*  HCT 29.4* 27.1* 28.1* 28.2* 27.0*  MCV 83.8 84.4 84.9 84.9 85.2  PLT 155 136* 145* 145* 131*   Cardiac Enzymes: No results for input(s): CKTOTAL, CKMB, CKMBINDEX, TROPONINI in the last 168 hours. BNP: BNP (last 3 results) No results for input(s): PROBNP in the last 8760 hours. CBG:  Recent Labs Lab 06/29/14 1121 06/29/14 1617 06/29/14 2102 06/30/14 0744 06/30/14 1134  GLUCAP 160* 151* 144* 94 111*       Signed:  [place] Jr  [facility] 06/30/2014, 12:06 PM

## 2014-06-30 NOTE — Progress Notes (Signed)
Subjective: Interval History: Patient denies any nausea or vomiting. No history of diarrhea. Overall she's feeling good.  Objective: Vital signs in last 24 hours: Temp:  [98 F (36.7 C)-98.6 F (37 C)] 98 F (36.7 C) (01/10 0525) Pulse Rate:  [65-80] 77 (01/10 0525) Resp:  [16-20] 20 (01/10 0525) BP: (147-155)/(66-75) 148/67 mmHg (01/10 0525) SpO2:  [92 %-99 %] 94 % (01/10 0525) Weight:  [54.432 kg (120 lb)] 54.432 kg (120 lb) (01/10 0525) Weight change: 0.091 kg (3.2 oz)  Intake/Output from previous day: 01/09 0701 - 01/10 0700 In: 840 [P.O.:840] Out: 2700 [Urine:2700] Intake/Output this shift: Total I/O In: -  Out: 200 [Urine:200]  Generally patient is alert and in no apparent distress Chest is clear to auscultation Heart exam regular rate and rhythm no murmur Extremities no edema  Lab Results:  Recent Labs  06/28/14 0605  WBC 9.8  HGB 9.4*  HCT 27.0*  PLT 131*   BMET:   Recent Labs  06/29/14 0603 06/30/14 0607  NA 138 137  K 4.5 4.4  CL 104 106  CO2 26 24  GLUCOSE 192* 96  BUN 17 18  CREATININE 0.98 1.06  CALCIUM 5.7* 6.7*   No results for input(s): PTH in the last 72 hours. Iron Studies: No results for input(s): IRON, TIBC, TRANSFERRIN, FERRITIN in the last 72 hours.  Studies/Results: No results found.  I have reviewed the patient's current medications.  Assessment/Plan: Problem #1 acute kidney injury: Her renal function as determined to her baseline. Problem #2 hypokalemia: Patient is off potassium supplement and her potassium is normal. Problem #3 hypophosphatemia: Her phosphorus has corrected and not on any phosphorus supplement. Problem #4 hypomagnesemia: Magnesium her magnesium slightly low but has improved. Problem #5 hypocalcemia: Calcium is low but improving progressively. Patient on calcium supplement. Problem #6 history of diabetes Problem #7 hypertension: Her blood pressure seems to be reasonably controlled Problem #8 history of  gastroenteritis: Has improved Plan: 1] we'll DC Rocaltrol 2] we'll change her calcium supplement to calcium with vitamin D 1 tablet by mouth twice a day 3] since her renal function has recovered and also her electrolytes and I'll sign off. Thank you    LOS: 8 days   Acxel Dingee S 06/30/2014,9:39 AM

## 2014-06-30 NOTE — Assessment & Plan Note (Signed)
Chronic UPJ stenosis.

## 2014-07-01 LAB — CALCIUM, URINE, 24 HOUR
Calcium, 24 hour urine: 84 mg/d — ABNORMAL LOW (ref 100–250)
Calcium, Ur: 4 mg/dL
URINE TOTAL VOLUME-UCA24: 2100 mL

## 2014-07-02 NOTE — Care Management Utilization Note (Signed)
UR completed 

## 2014-08-31 ENCOUNTER — Other Ambulatory Visit: Payer: Self-pay | Admitting: Cardiovascular Disease

## 2014-09-02 NOTE — Telephone Encounter (Signed)
Rx has been sent to the pharmacy electronically. ° °

## 2014-11-02 ENCOUNTER — Other Ambulatory Visit: Payer: Self-pay | Admitting: Cardiovascular Disease

## 2014-11-04 NOTE — Telephone Encounter (Signed)
Rx has been sent to the pharmacy electronically. ° °

## 2014-11-05 ENCOUNTER — Telehealth: Payer: Self-pay | Admitting: Cardiovascular Disease

## 2014-11-05 NOTE — Telephone Encounter (Signed)
Pt wants to know if Dr Gwenlyn Found wants her to continue taking Cilostazol?

## 2014-11-05 NOTE — Telephone Encounter (Signed)
Spoke with patient. Informed that pletal Rx was sent in yesterday for #30 with no refills because she needs an office visit with Dr. Gwenlyn Found. Last visit with cardiology 05/2013 and Dr. Gwenlyn Found 11/2012.   Will route message to Dr. Kennon Holter scheduler Dalene Seltzer to contact patient to schedule appointment

## 2014-11-25 ENCOUNTER — Telehealth: Payer: Self-pay | Admitting: Cardiovascular Disease

## 2014-11-25 NOTE — Telephone Encounter (Signed)
Left message for patient to call and schedule appointment with Dr. Berry 

## 2014-12-11 ENCOUNTER — Encounter: Payer: Self-pay | Admitting: Cardiovascular Disease

## 2014-12-11 ENCOUNTER — Ambulatory Visit (INDEPENDENT_AMBULATORY_CARE_PROVIDER_SITE_OTHER): Payer: Medicare Other | Admitting: Cardiovascular Disease

## 2014-12-11 VITALS — BP 128/70 | HR 71 | Ht 62.0 in | Wt 128.0 lb

## 2014-12-11 DIAGNOSIS — E785 Hyperlipidemia, unspecified: Secondary | ICD-10-CM

## 2014-12-11 DIAGNOSIS — I10 Essential (primary) hypertension: Secondary | ICD-10-CM

## 2014-12-11 DIAGNOSIS — Z79899 Other long term (current) drug therapy: Secondary | ICD-10-CM | POA: Diagnosis not present

## 2014-12-11 DIAGNOSIS — I739 Peripheral vascular disease, unspecified: Secondary | ICD-10-CM | POA: Diagnosis not present

## 2014-12-11 MED ORDER — CILOSTAZOL 50 MG PO TABS: ORAL_TABLET | ORAL | Status: DC

## 2014-12-11 MED ORDER — LISINOPRIL 10 MG PO TABS: 10.0000 mg | ORAL_TABLET | Freq: Every morning | ORAL | Status: DC

## 2014-12-11 MED ORDER — OMEPRAZOLE 20 MG PO CPDR
20.0000 mg | DELAYED_RELEASE_CAPSULE | Freq: Every morning | ORAL | Status: DC
Start: 2014-12-11 — End: 2020-11-16

## 2014-12-11 MED ORDER — PRAVASTATIN SODIUM 80 MG PO TABS: 80.0000 mg | ORAL_TABLET | Freq: Every morning | ORAL | Status: DC

## 2014-12-11 MED ORDER — CARVEDILOL 25 MG PO TABS: 25.0000 mg | ORAL_TABLET | Freq: Two times a day (BID) | ORAL | Status: DC

## 2014-12-11 NOTE — Patient Instructions (Signed)
Medication Instructions:   Continue your current medications  Labwork:  A FASTING lipid profile: to be done at your convenience.  There is a Psychologist, forensic lab on the first floor of this building, [address].  They are open from 8am-5pm with a lunch from 12-2.  You do not need an appointment.    Testing/Procedures:  Lower extremity arterial doppler- During this test, ultrasound is used to evaluate arterial blood flow in the legs. Allow approximately one hour for this exam.   Carotid Duplex- This test is an ultrasound of the carotid arteries in your neck. It looks at blood flow through these arteries that supply the brain with blood. Allow one hour for this exam. There are no restrictions or special instructions.   Follow-Up:  1 year with Dr Gwenlyn Found  Any Other Special Instructions Will Be Listed Below (If Applicable).

## 2014-12-11 NOTE — Assessment & Plan Note (Signed)
History of hypertension blood pressure much is a 120/70. She is on carvedilol and lisinopril. Continue current meds at current dosing

## 2014-12-11 NOTE — Assessment & Plan Note (Signed)
History of peripheral vascular disease with a known occluded right common iliac artery status post left common iliac stenting performed 11/24/06. She does complain of right lower extremity claudication. Her most recent lower extremity Dopplers performed 07/10/12 revealed a patent left common iliac artery stent. Her right ABIs 0.53 and left is 0.79. Her left SFA appears to be occluded in the midportion.

## 2014-12-11 NOTE — Progress Notes (Signed)
12/11/2014 Ariana Shea   06-26-1933  [id number]  Primary Physician Stephens Shire, MD Primary Cardiologist: Lorretta Harp MD Renae Gloss   HPI:  The patient is a 81 year old, thin-appearing, divorced Caucasian female, mother of 83, grandmother to 33 grandchildren and 63 step-great-grandchildren who I last saw 2 years  ago. She has a history of hypertension, hyperlipidemia and diabetes, as well as remote tobacco abuse having quit 6 years ago and smoked 55 pack years. She does have a family history of heart disease in a brother who had a massive myocardial infarction. She has had bilateral carotid endarterectomies performed by Dr. Ruta Hinds in the past after an angiogram done November 24, 2006, revealed high-grade bilateral calcified carotid artery stenosis with a bovine arch. She has a known occluded right common iliac artery stenosis and underwent stenting of her left common iliac with an 8 x 27 mm long Express balloon expandable stent. Carotid Dopplers revealed widely patent endarterectomy sites. She has not had lower extremity Dopplers in quite some time and has complained of progressive right calf claudication which is lifestyle limiting over the last year. She denies chest pain or shortness of breath. Her most recent Dopplers performed 07/10/12 were unchanged with a right ABI 0.53, occluded right common iliac, a left ABI of 0.79 with a patent left common iliac artery stent and with left SFA unchanged from prior studies. Since I saw her 2 years ago she's making clinically stable. She denies chest pain or shortness of breath but continues to complain of right lower extremity claudication.   Current Outpatient Prescriptions  Medication Sig Dispense Refill  . carvedilol (COREG) 25 MG tablet Take 25 mg by mouth 2 (two) times daily with a meal.    . cilostazol (PLETAL) 50 MG tablet TAKE 1 TABLET (50 MG TOTAL) BY MOUTH 2 (TWO) TIMES DAILY. NEED APPOINTMENT BEFORE ANYMORE REFILLS 30  tablet 0  . insulin glargine (LANTUS) 100 UNIT/ML injection Inject 0.1 mLs (10 Units total) into the skin 2 (two) times daily.    . iron polysaccharides (FERREX 150) 150 MG capsule Take 150 mg by mouth 2 (two) times daily.     Marland Kitchen levalbuterol (XOPENEX) 0.63 MG/3ML nebulizer solution Take 3 mLs (0.63 mg total) by nebulization 2 (two) times daily.    Marland Kitchen lisinopril (PRINIVIL,ZESTRIL) 10 MG tablet Take 1 tablet (10 mg total) by mouth every morning.    . magnesium oxide (MAG-OX) 400 MG tablet Take 1 tablet (400 mg total) by mouth 2 (two) times daily.    Marland Kitchen omeprazole (PRILOSEC) 20 MG capsule Take 20 mg by mouth every morning.     . pravastatin (PRAVACHOL) 80 MG tablet Take 80 mg by mouth every morning.      No current facility-administered medications for this visit.    Allergies  Allergen Reactions  . Red Blood Cells Other (See Comments)    Patient is a Jehovah's Witness  . Penicillins Rash  . Sulfonamide Derivatives Rash    History   Social History  . Marital Status: Single    Spouse Name: N/A  . Number of Children: N/A  . Years of Education: N/A   Occupational History  . Not on file.   Social History Main Topics  . Smoking status: Former Smoker -- 1.00 packs/day for 56 years    Types: Cigarettes    Quit date: 11/28/2008  . Smokeless tobacco: Never Used  . Alcohol Use: No     Comment: quit  . Drug Use:  No  . Sexual Activity: Not on file   Other Topics Concern  . Not on file   Social History Narrative     Review of Systems: General: negative for chills, fever, night sweats or weight changes.  Cardiovascular: negative for chest pain, dyspnea on exertion, edema, orthopnea, palpitations, paroxysmal nocturnal dyspnea or shortness of breath Dermatological: negative for rash Respiratory: negative for cough or wheezing Urologic: negative for hematuria Abdominal: negative for nausea, vomiting, diarrhea, bright red blood per rectum, melena, or hematemesis Neurologic: negative  for visual changes, syncope, or dizziness All other systems reviewed and are otherwise negative except as noted above.    Blood pressure 128/70, pulse 71, height 5\' 2"  (1.575 m), weight 128 lb (58.06 kg).  General appearance: alert and no distress Neck: no adenopathy, no carotid bruit, no JVD, supple, symmetrical, trachea midline and thyroid not enlarged, symmetric, no tenderness/mass/nodules Lungs: clear to auscultation bilaterally Heart: regular rate and rhythm, S1, S2 normal, no murmur, click, rub or gallop Extremities: extremities normal, atraumatic, no cyanosis or edema  EKG normal sinus rhythm with 71 sinus arrhythmia. I personally reviewed this EKG  ASSESSMENT AND PLAN:   Peripheral vascular disease History of peripheral vascular disease with a known occluded right common iliac artery status post left common iliac stenting performed 11/24/06. She does complain of right lower extremity claudication. Her most recent lower extremity Dopplers performed 07/10/12 revealed a patent left common iliac artery stent. Her right ABIs 0.53 and left is 0.79. Her left SFA appears to be occluded in the midportion.  Hypertension hhistory of hypertension blood pressure measured at 120/70. She is on carvedilol and lisinopril. Continue current meds at current dosing  HTN (hypertension) History of hypertension blood pressure much is a 120/70. She is on carvedilol and lisinopril. Continue current meds at current dosing  Hyperlipidemia History of hyperlipidemia on pravastatin. We'll check a lipid and liver profile  Carotid artery disease History of remote bilateral carotid endarterectomies performed by Dr. Ruta Hinds. Her most recent carotid Dopplers performed 01/04/13 revealed these to be widely patent.      Lorretta Harp MD FACP,FACC,FAHA, [facility] 12/11/2014 12:39 PM

## 2014-12-11 NOTE — Assessment & Plan Note (Signed)
hhistory of hypertension blood pressure measured at 120/70. She is on carvedilol and lisinopril. Continue current meds at current dosing

## 2014-12-11 NOTE — Assessment & Plan Note (Signed)
History of remote bilateral carotid endarterectomies performed by Dr. Ruta Hinds. Her most recent carotid Dopplers performed 01/04/13 revealed these to be widely patent.

## 2014-12-11 NOTE — Assessment & Plan Note (Signed)
History of hyperlipidemia on pravastatin. We'll check a lipid and liver profile

## 2014-12-16 ENCOUNTER — Other Ambulatory Visit: Payer: Self-pay

## 2014-12-19 ENCOUNTER — Other Ambulatory Visit: Payer: Self-pay | Admitting: Cardiovascular Disease

## 2014-12-19 DIAGNOSIS — I739 Peripheral vascular disease, unspecified: Secondary | ICD-10-CM

## 2014-12-25 ENCOUNTER — Ambulatory Visit (HOSPITAL_BASED_OUTPATIENT_CLINIC_OR_DEPARTMENT_OTHER)
Admission: RE | Admit: 2014-12-25 | Discharge: 2014-12-25 | Disposition: A | Discharge status: Home / Self Care | Payer: Medicare Other | Source: Ambulatory Visit | Attending: Cardiovascular Disease | Admitting: Cardiovascular Disease

## 2014-12-25 ENCOUNTER — Ambulatory Visit (HOSPITAL_COMMUNITY)
Admission: RE | Admit: 2014-12-25 | Discharge: 2014-12-25 | Disposition: A | Discharge status: Home / Self Care | Payer: Medicare Other | Source: Ambulatory Visit | Attending: Cardiovascular Disease | Admitting: Cardiovascular Disease

## 2014-12-25 ENCOUNTER — Ambulatory Visit (HOSPITAL_BASED_OUTPATIENT_CLINIC_OR_DEPARTMENT_OTHER)
Admission: RE | Admit: 2014-12-25 | Discharge: 2014-12-25 | Disposition: A | Discharge status: Home / Self Care | Payer: Medicare Other | Source: Ambulatory Visit

## 2014-12-25 DIAGNOSIS — I70203 Unspecified atherosclerosis of native arteries of extremities, bilateral legs: Secondary | ICD-10-CM | POA: Insufficient documentation

## 2014-12-25 DIAGNOSIS — I6523 Occlusion and stenosis of bilateral carotid arteries: Secondary | ICD-10-CM | POA: Diagnosis not present

## 2014-12-25 DIAGNOSIS — I10 Essential (primary) hypertension: Secondary | ICD-10-CM | POA: Diagnosis not present

## 2014-12-25 DIAGNOSIS — I714 Abdominal aortic aneurysm, without rupture: Secondary | ICD-10-CM | POA: Insufficient documentation

## 2014-12-25 DIAGNOSIS — I739 Peripheral vascular disease, unspecified: Secondary | ICD-10-CM

## 2014-12-25 DIAGNOSIS — E119 Type 2 diabetes mellitus without complications: Secondary | ICD-10-CM | POA: Diagnosis not present

## 2014-12-25 DIAGNOSIS — E785 Hyperlipidemia, unspecified: Secondary | ICD-10-CM | POA: Diagnosis not present

## 2014-12-25 DIAGNOSIS — Z95820 Peripheral vascular angioplasty status with implants and grafts: Secondary | ICD-10-CM | POA: Insufficient documentation

## 2014-12-25 LAB — LIPID PANEL
Cholesterol: 139 mg/dL (ref 0–200)
HDL: 49 mg/dL (ref >=46)
LDL CALC: 65 mg/dL (ref 0–99)
Total CHOL/HDL Ratio: 2.8 Ratio
Triglycerides: 125 mg/dL (ref <150)
VLDL: 25 mg/dL (ref 0–40)

## 2014-12-25 LAB — HEPATIC FUNCTION PANEL
ALT: 9 U/L (ref 0–35)
AST: 13 U/L (ref 0–37)
Albumin: 4.1 g/dL (ref 3.5–5.2)
Alkaline Phosphatase: 54 U/L (ref 39–117)
BILIRUBIN DIRECT: 0.1 mg/dL (ref 0.0–0.3)
BILIRUBIN INDIRECT: 0.3 mg/dL (ref 0.2–1.2)
BILIRUBIN TOTAL: 0.4 mg/dL (ref 0.2–1.2)
Total Protein: 6.3 g/dL (ref 6.0–8.3)

## 2014-12-26 ENCOUNTER — Telehealth: Payer: Self-pay | Admitting: *Deleted

## 2014-12-26 DIAGNOSIS — I739 Peripheral vascular disease, unspecified: Secondary | ICD-10-CM

## 2014-12-26 NOTE — Telephone Encounter (Signed)
Order placed for repeat lower extremity arterial doppler in 1 year  

## 2014-12-26 NOTE — Telephone Encounter (Signed)
-----   Message from Lorretta Harp, MD sent at 12/26/2014  7:02 AM EDT ----- No change from prior study. Repeat in 12 months.

## 2015-01-15 ENCOUNTER — Encounter: Payer: Self-pay | Admitting: Cardiovascular Disease

## 2015-02-25 ENCOUNTER — Other Ambulatory Visit: Payer: Self-pay | Admitting: *Deleted

## 2015-02-25 MED ORDER — PRAVASTATIN SODIUM 80 MG PO TABS: 80.0000 mg | ORAL_TABLET | Freq: Every morning | ORAL | Status: DC

## 2015-06-08 IMAGING — CT CT RENAL STONE PROTOCOL
2 of 4 series · 15 of 46 positions shown, 17 images · non-contrast
Comparison: No prior CT.  Urinary tract ultrasound 06/23/2014.

CLINICAL DATA: Acute onset right flank pain. Acute renal failure.
Surgical history includes cholecystectomy, hysterectomy and
unspecified bladder surgery.

EXAM:
CT ABDOMEN AND PELVIS WITHOUT CONTRAST
TECHNIQUE: Multidetector CT imaging of the abdomen and pelvis was performed
following the standard protocol without IV contrast.

[Series 2: standard/full over (age)lbs 5.0 · axial · 0.66mm/px · z∈[-415,-30]mm · 12 of 85 slices shown, 14 images]
[im 4/85  soft-tissue]
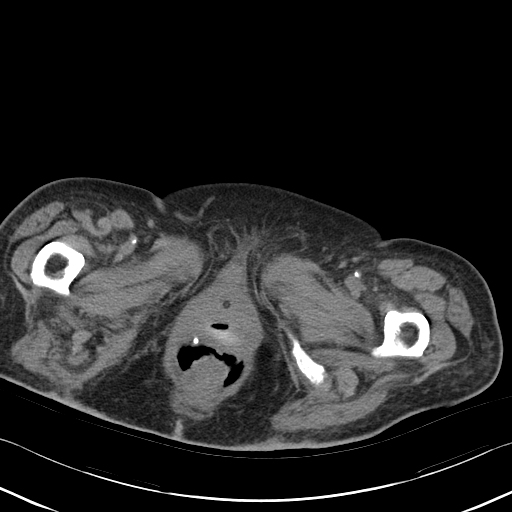
[im 4/85  bone]
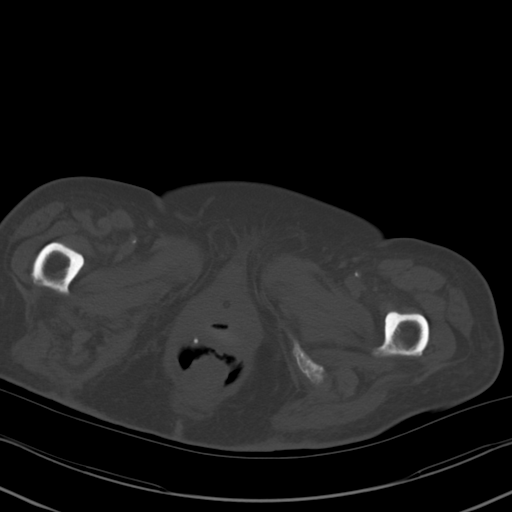
[im 11/85  soft-tissue]
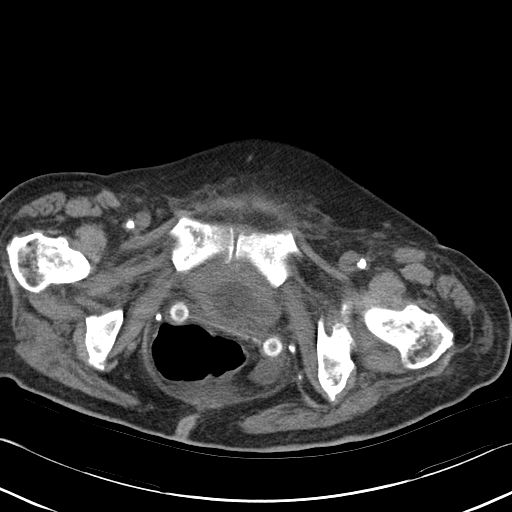
[im 18/85  soft-tissue]
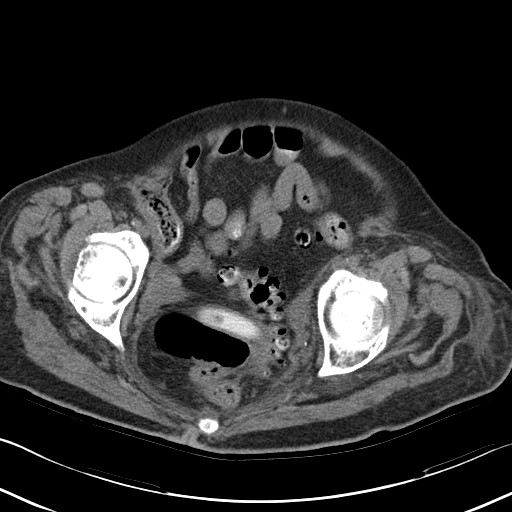
[im 25/85  soft-tissue]
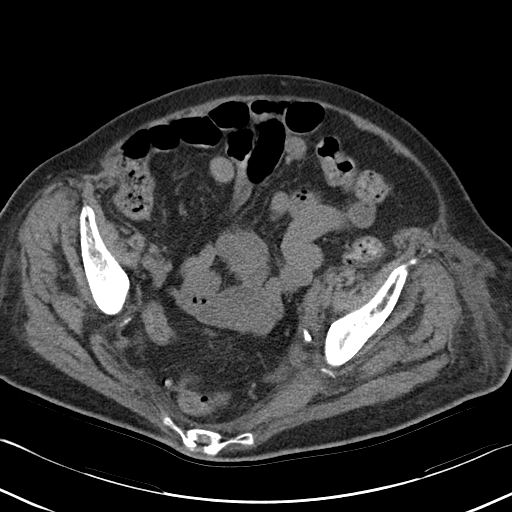
[im 32/85  soft-tissue]
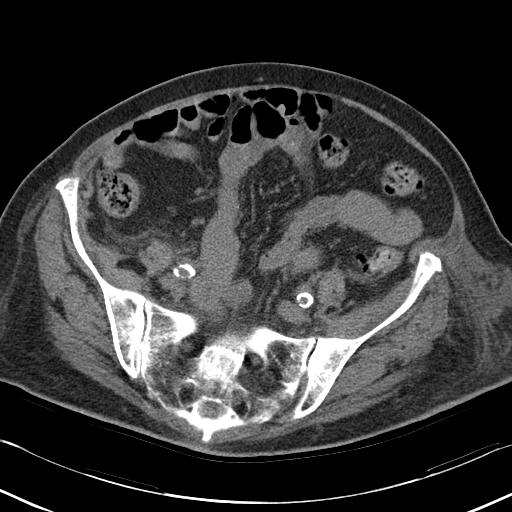
[im 39/85  soft-tissue]
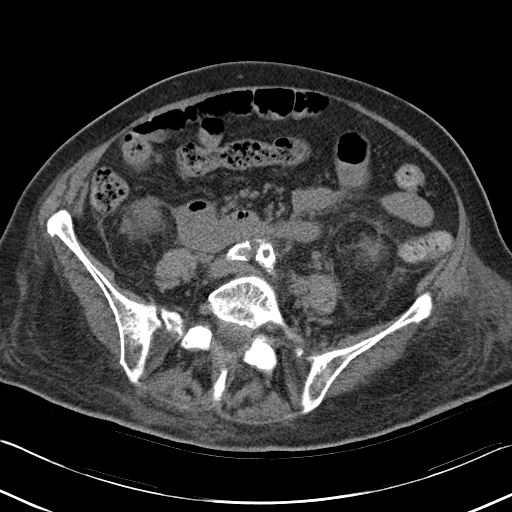
[im 46/85  soft-tissue]
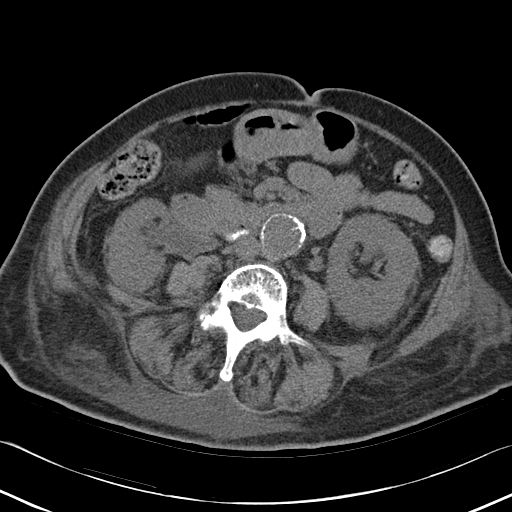
[im 53/85  soft-tissue]
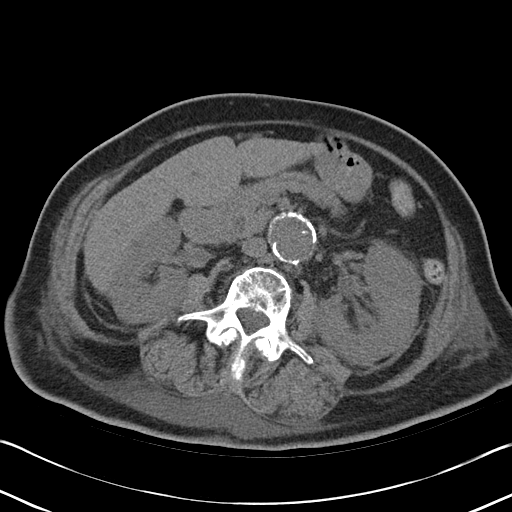
[im 60/85  soft-tissue]
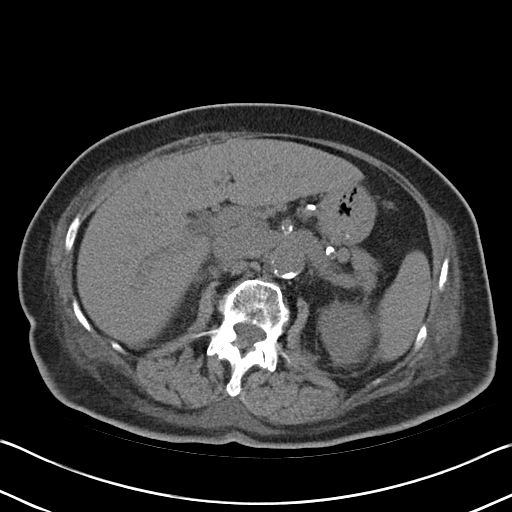
[im 60/85  bone]
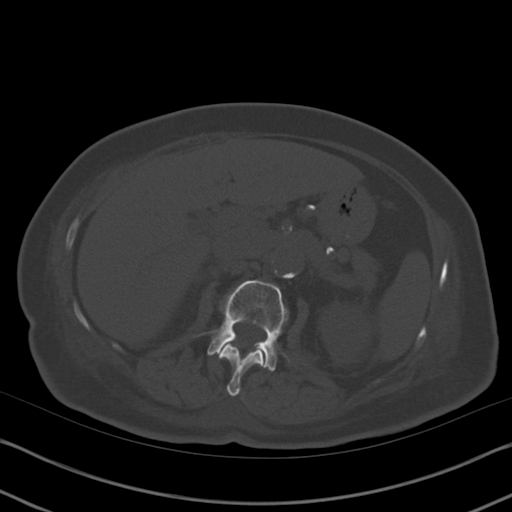
[im 67/85  soft-tissue]
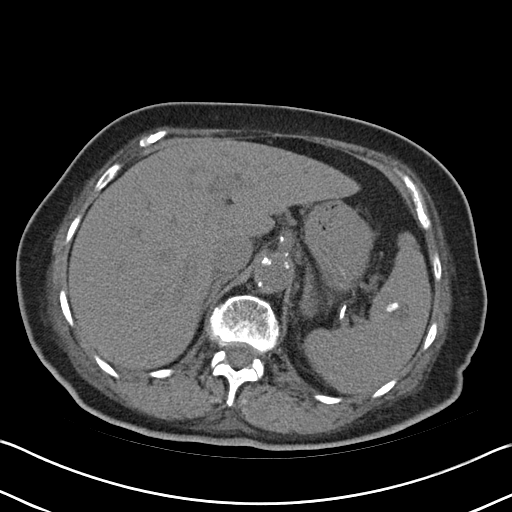
[im 74/85  soft-tissue]
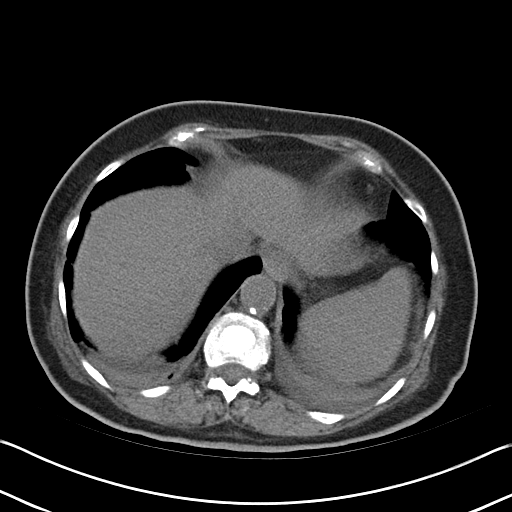
[im 81/85  soft-tissue]
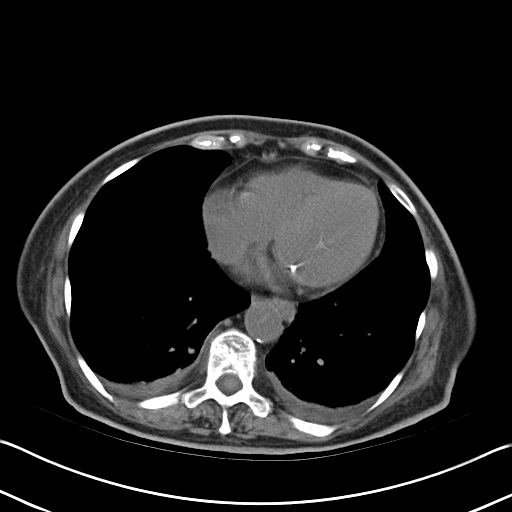

[Series 4: mpr coronal · coronal · 0.69mm/px · 3 of 86 slices shown]
[im 29/86  soft-tissue]
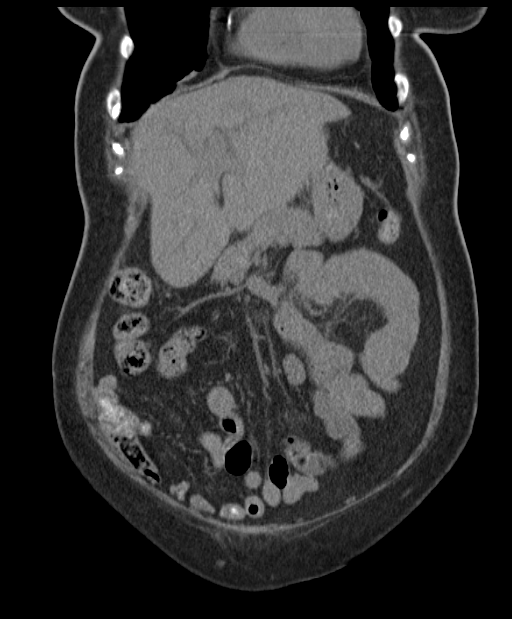
[im 38/86  soft-tissue]
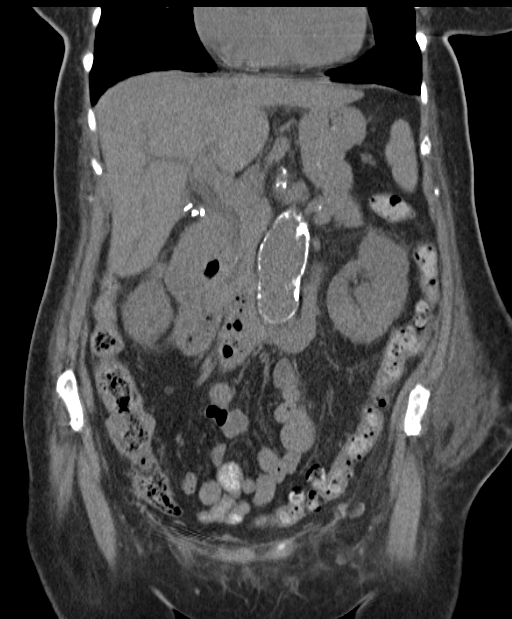
[im 48/86  soft-tissue]
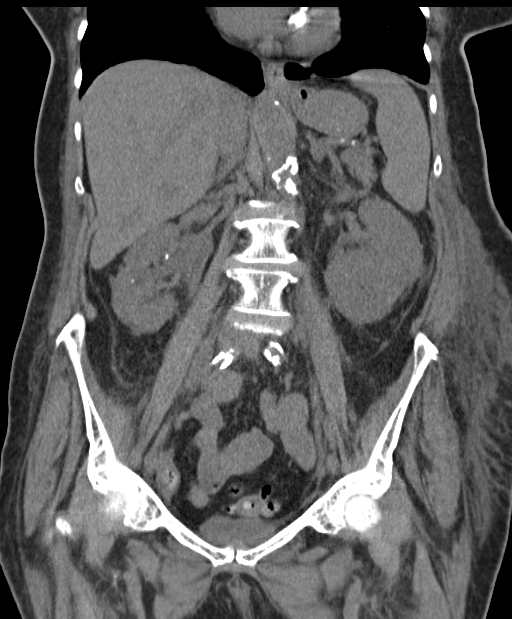

[15 of 46 positions shown; findings below may reference images not displayed]

FINDINGS: At least 3 nonobstructing calculi a within mid and lower pole
calices of the right kidney, the largest approximating 4 mm. Mild
right hydronephrosis without associated ureteral dilation. Mild
diffuse cortical thinning involving the right kidney. No left
urinary tract calculi. No left hydronephrosis. Perinephric edema
surrounding both kidneys. Within the limits of the unenhanced
technique, no focal parenchymal abnormality involving either kidney.

Normal unenhanced appearance of the liver. Partially calcified
approximate 1.2 x 1.4 x 1.7 cm mass in the otherwise
normal-appearing spleen. Normal right adrenal gland. Diffuse
enlargement of the left adrenal gland without nodularity. Moderate
to severe pancreatic atrophy. Gallbladder surgically absent. No
biliary ductal dilation.

Bilobed infrarenal abdominal aortic aneurysm which extends to the
aortic bifurcation, maximum AP diameter 3.5 cm and maximum
transverse diameter 3.0 cm. Severe atherosclerosis involving the
iliofemoral arteries with an indwelling left common femoral artery
stent. Visceral artery atherosclerosis. No significant
lymphadenopathy.

Stomach decompressed and unremarkable. Normal-appearing small bowel.
Diverticulosis involving the distal descending and sigmoid colon
without evidence of acute diverticulitis. Moderate stool burden.
Opaque ingested material scattered throughout the small bowel,
including the duodenum. Appendix not visualized but no pericecal
inflammation.

Pessary device in the pelvis. Urinary bladder decompressed by Foley
catheter. Uterus surgically absent. No adnexal masses. Small amount
of free fluid in the left low pelvis. Multiple bilateral pelvic
phleboliths.

Bone window images demonstrate thoracolumbar scoliosis, lower
thoracic spondylosis, calcification within the T11-T12 disc,
degenerative disc disease at L3-4 L4-5, and mild degenerative
changes in both hips. Small bilateral pleural effusions and
associated passive atelectasis in the visualized lower lobes without
evidence of pulmonary edema. Heart enlarged with mitral annular
calcification and right coronary atherosclerosis. Note is made of
edema in the dependent subcutaneous tissues of the back.
IMPRESSION: 1. Nonobstructing calculi within mid and lower pole calices of the
right kidney.
2. Mild right hydronephrosis secondary to chronic UPJ stenosis, as
the ureter is not dilated.
3. No acute abnormalities involving the abdomen or pelvis.
4. Bilobed in from a renal abdominal aortic aneurysm, maximum AP
diameter 3.5 mm. Please see below for followup recommendations.
5. Distal descending and sigmoid colon diverticulosis without
evidence of acute diverticulitis.
6. Small bilateral pleural effusions and associated mild passive
atelectasis in the visualized lower lobes without evidence of
pulmonary edema.
Recommend followup by US in 2 years. This recommendation follows ACR
consensus guidelines: White Paper of the ACR Incidental Findings
Committee II on Vascular Findings. [HOSPITAL] 5250;

## 2015-06-09 IMAGING — CR DG CHEST 2V
2 series · 2 of 2 positions shown · non-contrast
Comparison: 06/22/2014 acute abdomen series.

CLINICAL DATA: Wheezing and shortness of breath.

EXAM:
CHEST  2 VIEW

[view not recorded (1 of 2)]
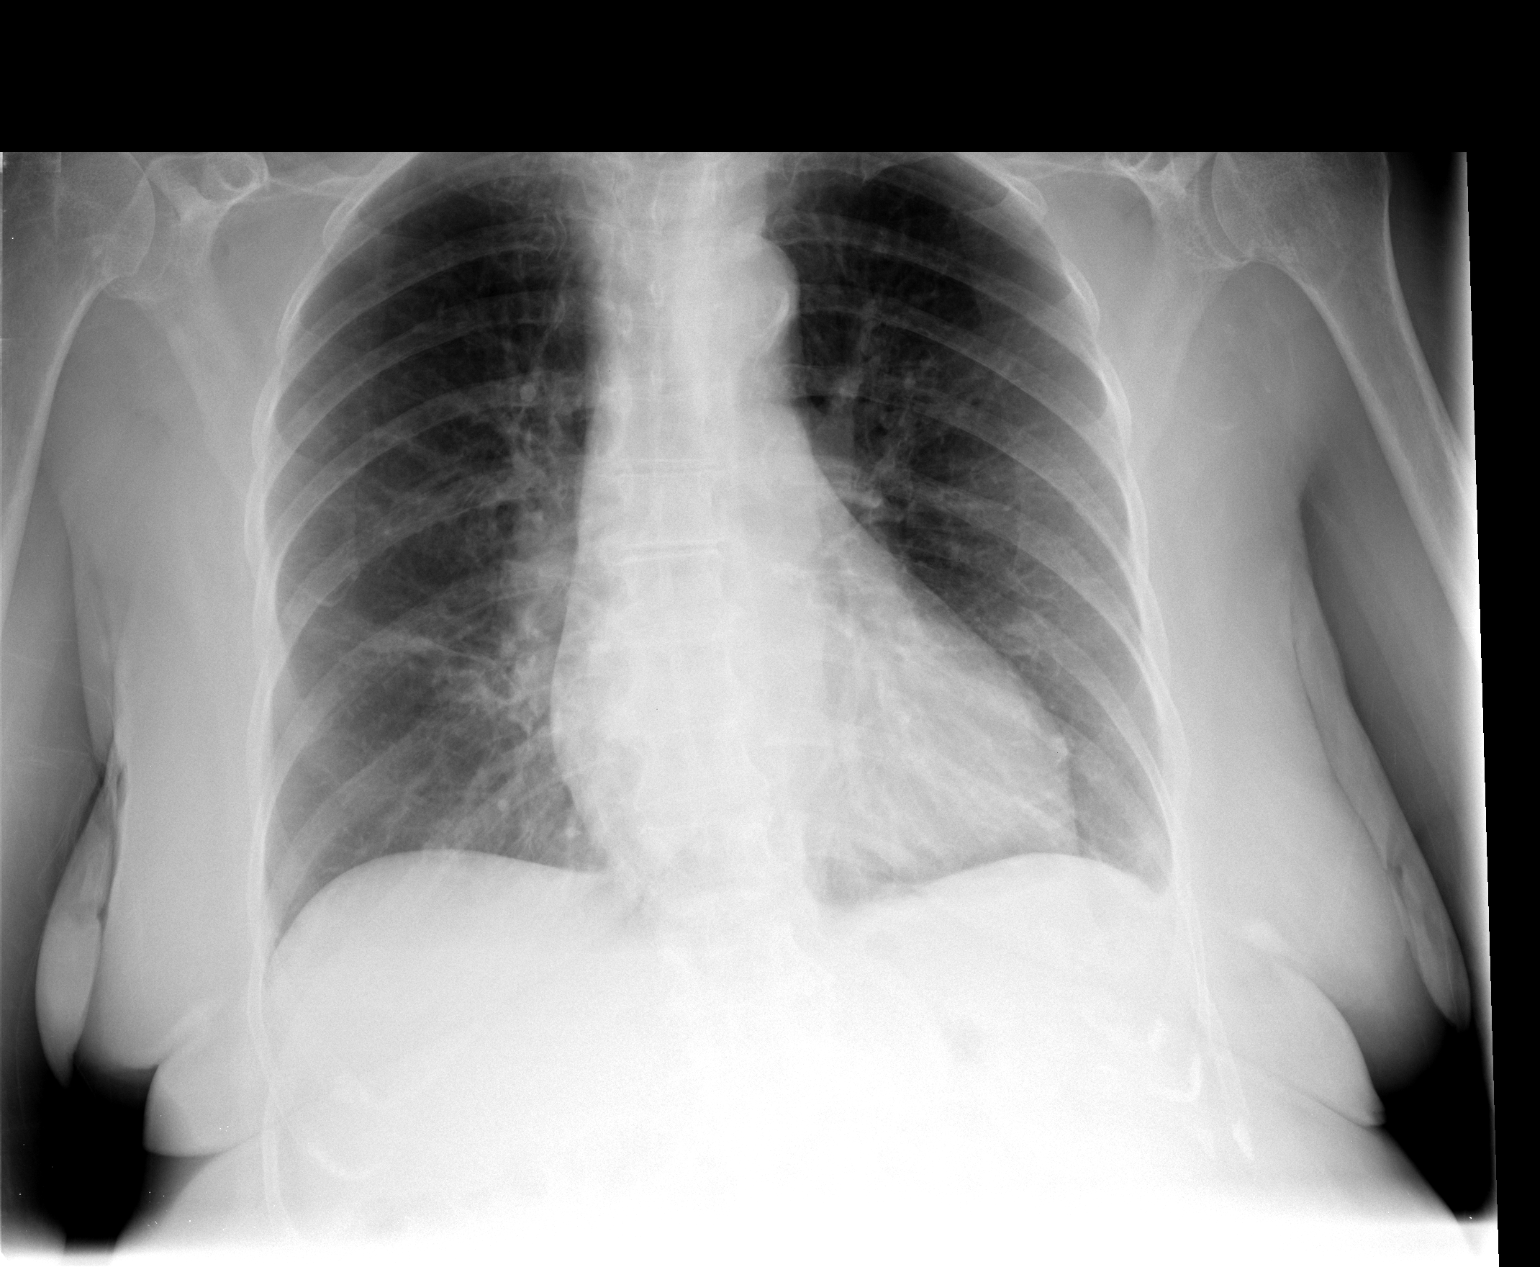

[view not recorded (2 of 2)]
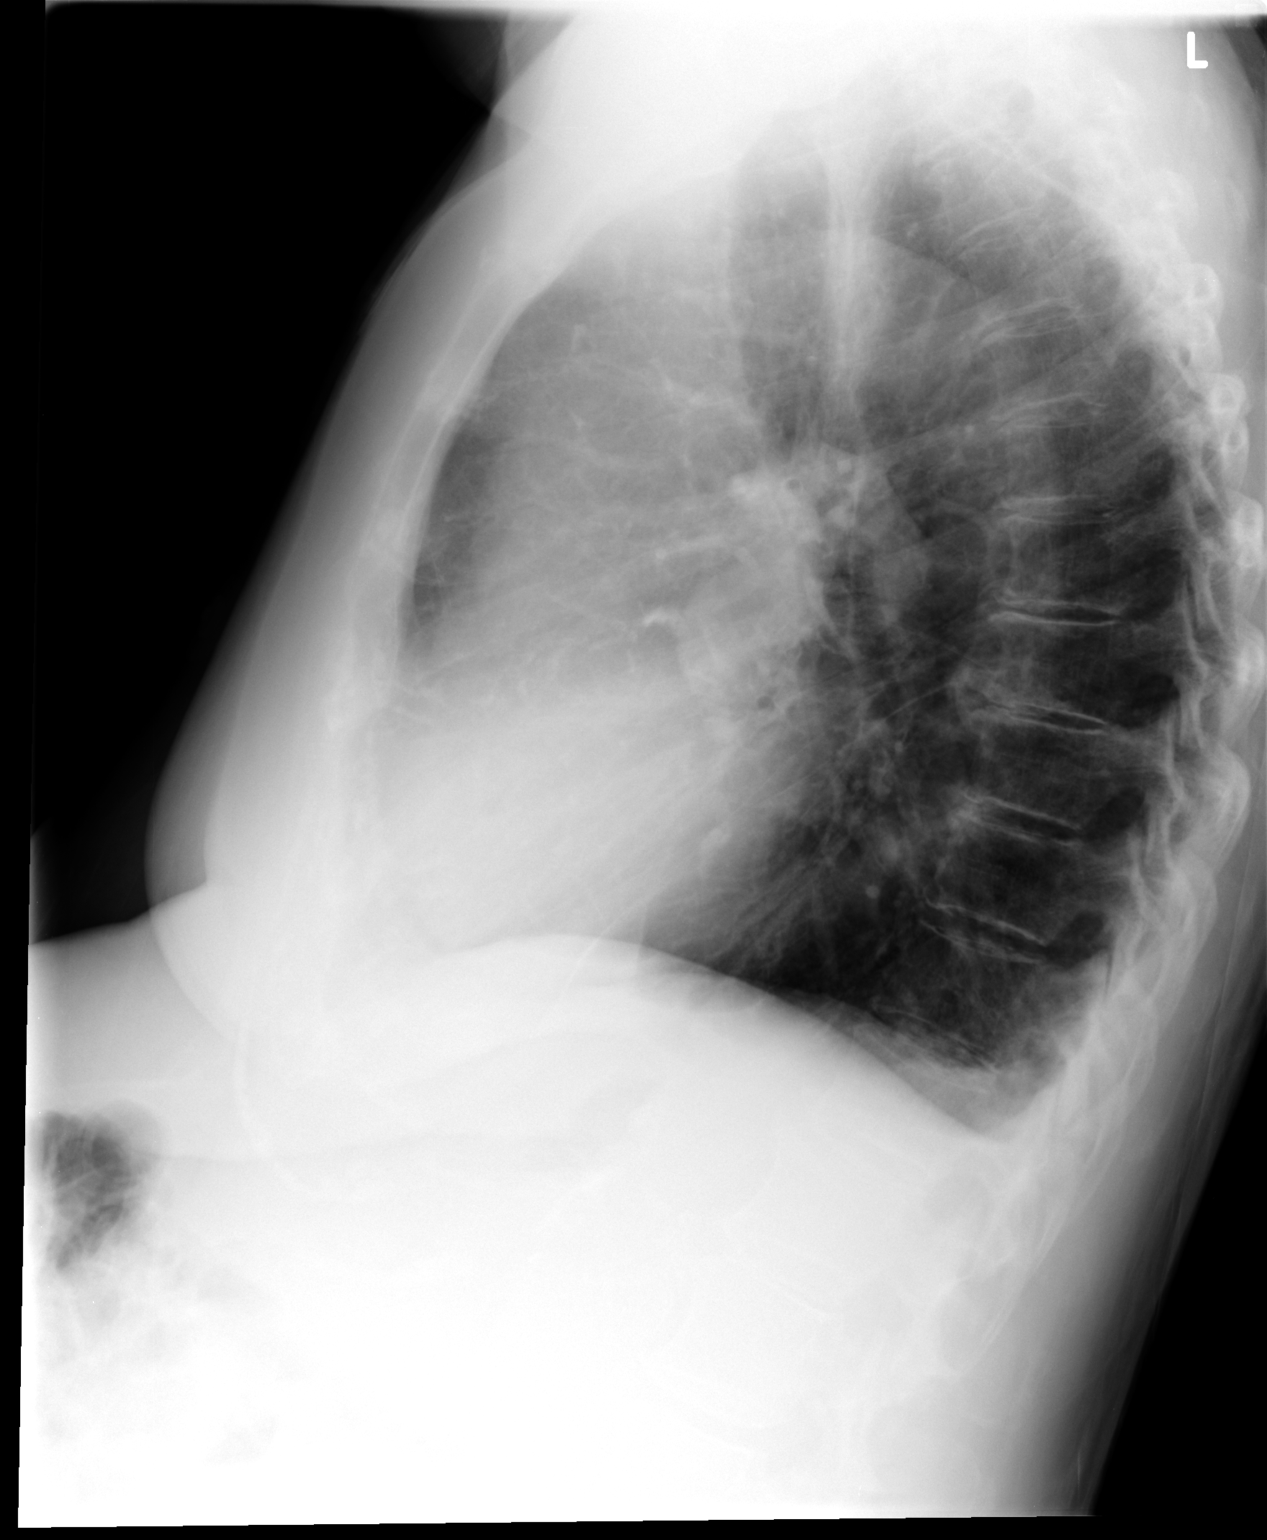

[2 of 2 positions shown; findings below may reference images not displayed]

FINDINGS: Mild hyperinflation. Midline trachea. Normal heart size.
Atherosclerosis in the transverse aorta. Trace left pleural fluid .
No pneumothorax. No congestive failure. Diffuse peribronchial
thickening. Mild hyperinflation. Linear opacity radiating from the
right hilum is likely due to subsegmental atelectasis. New since the
prior.
IMPRESSION: COPD/chronic bronchitis.  No acute superimposed process.

Trace left pleural fluid is similar.

## 2015-07-23 ENCOUNTER — Other Ambulatory Visit: Payer: Self-pay | Admitting: Cardiovascular Disease

## 2015-07-23 NOTE — Telephone Encounter (Signed)
Rx(s) sent to pharmacy electronically.  

## 2015-10-10 ENCOUNTER — Other Ambulatory Visit: Payer: Self-pay | Admitting: Cardiovascular Disease

## 2015-10-13 ENCOUNTER — Other Ambulatory Visit: Payer: Self-pay | Admitting: Cardiovascular Disease

## 2015-10-13 NOTE — Telephone Encounter (Signed)
REFILL 

## 2015-11-19 ENCOUNTER — Encounter: Payer: Self-pay | Admitting: Physician Assistant

## 2015-11-19 ENCOUNTER — Ambulatory Visit (INDEPENDENT_AMBULATORY_CARE_PROVIDER_SITE_OTHER): Payer: Medicare Other | Admitting: Physician Assistant

## 2015-11-19 VITALS — BP 175/85 | HR 77 | Ht 63.0 in | Wt 143.2 lb

## 2015-11-19 DIAGNOSIS — L089 Local infection of the skin and subcutaneous tissue, unspecified: Secondary | ICD-10-CM | POA: Diagnosis not present

## 2015-11-19 DIAGNOSIS — I10 Essential (primary) hypertension: Secondary | ICD-10-CM | POA: Diagnosis not present

## 2015-11-19 DIAGNOSIS — E785 Hyperlipidemia, unspecified: Secondary | ICD-10-CM

## 2015-11-19 DIAGNOSIS — I739 Peripheral vascular disease, unspecified: Secondary | ICD-10-CM

## 2015-11-19 DIAGNOSIS — R21 Rash and other nonspecific skin eruption: Secondary | ICD-10-CM

## 2015-11-19 DIAGNOSIS — I779 Disorder of arteries and arterioles, unspecified: Secondary | ICD-10-CM

## 2015-11-19 MED ORDER — DOXYCYCLINE HYCLATE 100 MG PO CAPS: 100.0000 mg | ORAL_CAPSULE | Freq: Two times a day (BID) | ORAL | Status: DC

## 2015-11-19 NOTE — Progress Notes (Signed)
Patient ID: Ariana Shea, female   DOB: 10/01/1933, 82 y.o.   MRN: [id number]    Date:  11/19/2015   ID:  Ariana Shea, DOB Jun 07, 1934, MRN [id number]  PCP:  Ariana Shire, MD  Primary Cardiologist:  Gwenlyn Found  Chief Complaint  Patient presents with  . Follow-up    carotid area is infected     History of Present Illness: Ariana Shea is a 82 y.o. female thin-appearing, divorced Caucasian female, mother of 16, grandmother to 36 grandchildren and 6 step-great-grandchildren.  She has a history of hypertension, hyperlipidemia and diabetes, as well as remote tobacco abuse having quit 6 years ago and smoked 55 pack years. She does have a family history of heart disease in a brother who had a massive myocardial infarction. She has had bilateral carotid endarterectomies performed by Dr. Ruta Hinds in the past after an angiogram done November 24, 2006, revealed high-grade bilateral calcified carotid artery stenosis with a bovine arch. She has a known occluded right common iliac artery stenosis and underwent stenting of her left common iliac with an 8 x 27 mm long Express balloon expandable stent. Carotid Dopplers revealed widely patent endarterectomy sites. She has not had lower extremity Dopplers in quite some time and in June 2016 was complaining of progressive right calf claudication which was lifestyle limiting over the last year.  Her most recent Dopplers performed 07/10/12 were unchanged with a right ABI 0.53, occluded right common iliac, a left ABI of 0.79 with a patent left common iliac artery stent and with left SFA unchanged from prior studies.   Patient presents for evaluation of a rash/infection on the left side of her neck.  It has been there for the last 2 months. Her primary care provider recommended she try Lamisil which she did for about 2 months and it has not gotten better. There was some pustular formation which appears to be scabbed over.  She denies any fever, chills, nausea, vomiting or  tenderness in the area. According to her daughter, except the area.  Patient also reports that the claudication symptoms she had last year have improved since starting Pletal  The patient currently denies nausea, vomiting, fever, chest pain, shortness of breath, orthopnea, dizziness, PND, cough, congestion, abdominal pain, hematochezia, melena, lower extremity edema, claudication.  Wt Readings from Last 3 Encounters:  11/19/15 143 lb 4 oz (64.978 kg)  12/11/14 128 lb (58.06 kg)  06/30/14 120 lb (54.432 kg)     Past Medical History  Diagnosis Date  . Hypertension   . Diabetes mellitus   . Peripheral arterial disease ([place])     history of known right common iliac artery occlusion status post left iliac stenting.  . Anemia   . Pure hypercholesterolemia   . GERD (gastroesophageal reflux disease)   . Generalized and unspecified atherosclerosis   . Pneumonia     hx  . UTI (lower urinary tract infection)     recent tx   . Swallowing difficulty     gets crumbs caught in throat-told may have to have esophagus streched  . Arthritis     HANDS  . Blood transfusion without reported diagnosis   . Ulcer   . Hydronephrosis of right kidney 06/26/2014    Chronic UPJ stenosis  . Abdominal aortic aneurysm ([place]) 06/30/2014    Recommend follow-up ultrasound in 2018.  Marland Kitchen Hypomagnesemia 06/23/2014    And hypocalcemia, hypophosphatemia, and hypokalemia.    Current Outpatient Prescriptions  Medication Sig Dispense Refill  .  carvedilol (COREG) 25 MG tablet Take 1 tablet (25 mg total) by mouth 2 (two) times daily with a meal. 60 tablet 11  . cilostazol (PLETAL) 50 MG tablet TAKE 1 TABLET (50 MG TOTAL) BY MOUTH 2 (TWO) TIMES DAILY. 60 tablet 1  . insulin glargine (LANTUS) 100 UNIT/ML injection Inject 0.1 mLs (10 Units total) into the skin 2 (two) times daily.    . iron polysaccharides (FERREX 150) 150 MG capsule Take 150 mg by mouth 2 (two) times daily.     Marland Kitchen levalbuterol (XOPENEX) 0.63 MG/3ML nebulizer  solution Take 3 mLs (0.63 mg total) by nebulization 2 (two) times daily.    Marland Kitchen lisinopril (PRINIVIL,ZESTRIL) 10 MG tablet Take 1 tablet (10 mg total) by mouth every morning. 30 tablet 11  . magnesium oxide (MAG-OX) 400 MG tablet Take 1 tablet (400 mg total) by mouth 2 (two) times daily.    Marland Kitchen omeprazole (PRILOSEC) 20 MG capsule Take 1 capsule (20 mg total) by mouth every morning. 30 capsule 11  . pravastatin (PRAVACHOL) 80 MG tablet Take 1 tablet (80 mg total) by mouth every morning. 90 tablet 3  . doxycycline (VIBRAMYCIN) 100 MG capsule Take 1 capsule (100 mg total) by mouth 2 (two) times daily. 10 capsule 0   No current facility-administered medications for this visit.    Allergies:    Allergies  Allergen Reactions  . Red Blood Cells Other (See Comments)    Patient is a Jehovah's Witness  . Penicillins Rash  . Sulfonamide Derivatives Rash    Social History:  The patient  reports that she quit smoking about 6 years ago. Her smoking use included Cigarettes. She has a 56 pack-year smoking history. She has never used smokeless tobacco. She reports that she does not drink alcohol or use illicit drugs.   Family history:   Family History  Problem Relation Age of Onset  . Heart disease Father     ROS:  Please see the history of present illness.  All other systems reviewed and negative.   PHYSICAL EXAM: VS:  BP 175/85 mmHg  Pulse 77  Ht 5\' 3"  (1.6 m)  Wt 143 lb 4 oz (64.978 kg)  BMI 25.38 kg/m2 Well nourished, well developed, in no acute distress HEENT: Pupils are equal round react to light accommodation extraocular movements are intact.  Neck: no JVDNo cervical lymphadenopathy. Cardiac: Regular rate and rhythm without murmurs rubs or gallops. Lungs:  clear to auscultation bilaterally, no wheezing, rhonchi or rales Abd: soft, nontender, positive bowel sounds all quadrants, no hepatosplenomegaly Ext: no lower extremity edema.  2+ radial and dorsalis pedis pulses.  Skin: Irregular  shaped area of erythema essentially perpendicular to her previous left endarterectomy scar.  The area in the areas about 4-5 cm in length x 1-2cm wide. The edges are darker red than the central area.  There appears to be some flaking skin along the borders. There is also some scabbing  Centrally in a linear pattern.   Neuro:  Grossly normal    ASSESSMENT AND PLAN:  Problem List Items Addressed This Visit    Skin infection, left side of neck near carotid endarterectomy scar   Hyperlipidemia   HTN (hypertension)    Other Visit Diagnoses    Rash of neck    -  Primary    Relevant Orders    Ambulatory referral to Dermatology       Patient presented for evaluation of possible skin infection on the left side of her neck near  carotid endarterectomy site. Ongoing for 2 months. She tried Lamisil over-the-counter during the 2 month period with no improvement. I did start her on doxycycline 100 mg twice daily and made a referral to Dr. Denna Haggard with Dermatology.  Continue other medications.

## 2015-11-19 NOTE — Patient Instructions (Signed)
Your physician has recommended you make the following change in your medication:   1.) start antibiotic that has been sent to your pharmacy.  You have been referred to Dr Lavonna Monarch [facility]) to evaluate the rash on your neck.  Your physician wants you to follow-up in: 6 months or sooner if needed with Dr Gwenlyn Found. You will receive a reminder letter in the mail two months in advance. If you don't receive a letter, please call our office to schedule the follow-up appointment.   If you need a refill on your cardiac medications before your next appointment, please call your pharmacy.

## 2015-12-16 ENCOUNTER — Other Ambulatory Visit: Payer: Self-pay | Admitting: Dermatology

## 2015-12-16 DIAGNOSIS — C4491 Basal cell carcinoma of skin, unspecified: Secondary | ICD-10-CM

## 2015-12-16 HISTORY — DX: Basal cell carcinoma of skin, unspecified: C44.91

## 2016-01-15 ENCOUNTER — Other Ambulatory Visit: Payer: Self-pay | Admitting: Cardiovascular Disease

## 2016-01-15 DIAGNOSIS — I739 Peripheral vascular disease, unspecified: Secondary | ICD-10-CM

## 2016-01-18 ENCOUNTER — Other Ambulatory Visit: Payer: Self-pay | Admitting: Cardiovascular Disease

## 2016-01-26 ENCOUNTER — Ambulatory Visit (HOSPITAL_COMMUNITY)
Admission: RE | Admit: 2016-01-26 | Discharge: 2016-01-26 | Disposition: A | Discharge status: Home / Self Care | Payer: Medicare Other | Source: Ambulatory Visit | Attending: Cardiovascular Disease | Admitting: Cardiovascular Disease

## 2016-01-26 DIAGNOSIS — I739 Peripheral vascular disease, unspecified: Secondary | ICD-10-CM | POA: Diagnosis not present

## 2016-01-26 DIAGNOSIS — I745 Embolism and thrombosis of iliac artery: Secondary | ICD-10-CM | POA: Insufficient documentation

## 2016-01-26 DIAGNOSIS — I714 Abdominal aortic aneurysm, without rupture: Secondary | ICD-10-CM | POA: Insufficient documentation

## 2016-01-26 DIAGNOSIS — K219 Gastro-esophageal reflux disease without esophagitis: Secondary | ICD-10-CM | POA: Diagnosis not present

## 2016-01-26 DIAGNOSIS — R938 Abnormal findings on diagnostic imaging of other specified body structures: Secondary | ICD-10-CM | POA: Diagnosis not present

## 2016-01-26 DIAGNOSIS — E119 Type 2 diabetes mellitus without complications: Secondary | ICD-10-CM | POA: Insufficient documentation

## 2016-01-26 DIAGNOSIS — I10 Essential (primary) hypertension: Secondary | ICD-10-CM | POA: Diagnosis not present

## 2016-01-29 ENCOUNTER — Other Ambulatory Visit: Payer: Self-pay | Admitting: *Deleted

## 2016-01-29 DIAGNOSIS — I739 Peripheral vascular disease, unspecified: Secondary | ICD-10-CM

## 2016-01-29 DIAGNOSIS — I714 Abdominal aortic aneurysm, without rupture, unspecified: Secondary | ICD-10-CM

## 2016-02-20 ENCOUNTER — Other Ambulatory Visit: Payer: Self-pay | Admitting: Cardiovascular Disease

## 2016-02-24 NOTE — Telephone Encounter (Signed)
Rx(s) sent to pharmacy electronically.  

## 2016-06-01 ENCOUNTER — Ambulatory Visit: Payer: Medicare Other | Admitting: Cardiovascular Disease

## 2016-06-08 ENCOUNTER — Ambulatory Visit (INDEPENDENT_AMBULATORY_CARE_PROVIDER_SITE_OTHER): Payer: Medicare Other | Admitting: Cardiovascular Disease

## 2016-06-08 ENCOUNTER — Encounter: Payer: Self-pay | Admitting: Cardiovascular Disease

## 2016-06-08 DIAGNOSIS — I10 Essential (primary) hypertension: Secondary | ICD-10-CM | POA: Diagnosis not present

## 2016-06-08 DIAGNOSIS — E7800 Pure hypercholesterolemia, unspecified: Secondary | ICD-10-CM

## 2016-06-08 DIAGNOSIS — I779 Disorder of arteries and arterioles, unspecified: Secondary | ICD-10-CM | POA: Diagnosis not present

## 2016-06-08 DIAGNOSIS — I739 Peripheral vascular disease, unspecified: Secondary | ICD-10-CM | POA: Diagnosis not present

## 2016-06-08 NOTE — Progress Notes (Signed)
06/08/2016 Ariana Shea   July 02, 1933  SD:[id number]  Primary Physician Stephens Shire, MD Primary Cardiologist: Lorretta Harp MD Renae Gloss  HPI:   The patient is a 82 year old, thin-appearing, divorced Caucasian female, mother of 26, grandmother to 41 grandchildren and 70 step-great-grandchildren who I last saw in the office 12/11/14 She has a history of hypertension, hyperlipidemia and diabetes, as well as remote tobacco abuse having quit 6 years ago and smoked 55 pack years. She does have a family history of heart disease in a brother who had a massive myocardial infarction. She has had bilateral carotid endarterectomies performed by Dr. Ruta Hinds in the past after an angiogram done November 24, 2006, revealed high-grade bilateral calcified carotid artery stenosis with a bovine arch. She has a known occluded right common iliac artery stenosis and underwent stenting of her left common iliac with an 8 x 27 mm long Express balloon expandable stent. Carotid Dopplers revealed widely patent endarterectomy sites. She has not had lower extremity Dopplers in quite some time and has complained of progressive right calf claudication which is lifestyle limiting over the last year. She denies chest pain or shortness of breath. Her most recent Dopplers performed 07/10/12 were unchanged with a right ABI 0.53, occluded right common iliac, a left ABI of 0.79 with a patent left common iliac artery stent and with left SFA unchanged from prior studies. Since I saw her 1 years ago she's making clinically stable. She denies chest pain or shortness of breath but continues to complain of right lower extremity claudication.   Current Outpatient Prescriptions  Medication Sig Dispense Refill  . carvedilol (COREG) 25 MG tablet Take 1 tablet (25 mg total) by mouth 2 (two) times daily with a meal. 60 tablet 11  . cilostazol (PLETAL) 50 MG tablet TAKE 1 TABLET (50 MG TOTAL) BY MOUTH 2 (TWO) TIMES DAILY. 60 tablet 1   . cilostazol (PLETAL) 50 MG tablet TAKE 1 TABLET (50 MG TOTAL) BY MOUTH 2 (TWO) TIMES DAILY. 60 tablet 10  . insulin glargine (LANTUS) 100 UNIT/ML injection Inject 0.1 mLs (10 Units total) into the skin 2 (two) times daily.    . iron polysaccharides (FERREX 150) 150 MG capsule Take 150 mg by mouth 2 (two) times daily.     Marland Kitchen levalbuterol (XOPENEX) 0.63 MG/3ML nebulizer solution Take 3 mLs (0.63 mg total) by nebulization 2 (two) times daily.    Marland Kitchen lisinopril (PRINIVIL,ZESTRIL) 10 MG tablet Take 1 tablet (10 mg total) by mouth every morning. 30 tablet 11  . magnesium oxide (MAG-OX) 400 MG tablet Take 1 tablet (400 mg total) by mouth 2 (two) times daily.    Marland Kitchen omeprazole (PRILOSEC) 20 MG capsule Take 1 capsule (20 mg total) by mouth every morning. 30 capsule 11  . pravastatin (PRAVACHOL) 80 MG tablet Take 1 tablet (80 mg total) by mouth every morning. 90 tablet 3   No current facility-administered medications for this visit.     Allergies  Allergen Reactions  . Red Blood Cells Other (See Comments)    Patient is a Jehovah's Witness  . Penicillins Rash  . Sulfonamide Derivatives Rash    Social History   Social History  . Marital status: Single    Spouse name: N/A  . Number of children: N/A  . Years of education: N/A   Occupational History  . Not on file.   Social History Main Topics  . Smoking status: Former Smoker    Packs/day: 1.00  Years: 56.00    Types: Cigarettes    Quit date: 11/28/2008  . Smokeless tobacco: Never Used  . Alcohol use No     Comment: quit  . Drug use: No  . Sexual activity: Not on file   Other Topics Concern  . Not on file   Social History Narrative  . No narrative on file     Review of Systems: General: negative for chills, fever, night sweats or weight changes.  Cardiovascular: negative for chest pain, dyspnea on exertion, edema, orthopnea, palpitations, paroxysmal nocturnal dyspnea or shortness of breath Dermatological: negative for  rash Respiratory: negative for cough or wheezing Urologic: negative for hematuria Abdominal: negative for nausea, vomiting, diarrhea, bright red blood per rectum, melena, or hematemesis Neurologic: negative for visual changes, syncope, or dizziness All other systems reviewed and are otherwise negative except as noted above.    Blood pressure 136/66, pulse 73, height 5\' 2"  (1.575 m), weight 142 lb (64.4 kg).  General appearance: alert and no distress Neck: no adenopathy, no carotid bruit, no JVD, supple, symmetrical, trachea midline and thyroid not enlarged, symmetric, no tenderness/mass/nodules Lungs: clear to auscultation bilaterally Heart: regular rate and rhythm, S1, S2 normal, no murmur, click, rub or gallop Extremities: extremities normal, atraumatic, no cyanosis or edema  EKG sinus rhythm at 73 with Q waves across her precordium. I personally reviewed this EKG  ASSESSMENT AND PLAN:   Hypertension History of hypertension blood pressure measured 136/66. She is on lisinopril and carvedilol.. Continue current meds at current dosing  PVD (peripheral vascular disease) ([place]) History of peripheral arterial disease status post stenting of her left common iliac artery with an 8 mm x 27 mm long balloon expandable stent. She does have a known occluded right iliac artery. She really denies claudication.  Pure hypercholesterolemia History of hyperlipidemia on Pravachol followed by her PCP  Carotid artery disease History of carotid artery disease by with high-grade calcified bilateral internal carotid artery stenosis by angiography with a bovine arch. She has had bilateral carotid endarterectomies performed by Dr. Ruta Hinds which he follows.      Lorretta Harp MD FACP,FACC,FAHA, [facility] 06/08/2016 11:11 AM

## 2016-06-08 NOTE — Assessment & Plan Note (Signed)
History of hypertension blood pressure measured 136/66. She is on lisinopril and carvedilol.. Continue current meds at current dosing

## 2016-06-08 NOTE — Patient Instructions (Signed)

## 2016-06-08 NOTE — Assessment & Plan Note (Signed)
History of peripheral arterial disease status post stenting of her left common iliac artery with an 8 mm x 27 mm long balloon expandable stent. She does have a known occluded right iliac artery. She really denies claudication.

## 2016-06-08 NOTE — Assessment & Plan Note (Signed)
History of hyperlipidemia on Pravachol followed by her PCP. 

## 2016-06-08 NOTE — Assessment & Plan Note (Signed)
History of carotid artery disease by with high-grade calcified bilateral internal carotid artery stenosis by angiography with a bovine arch. She has had bilateral carotid endarterectomies performed by Dr. Ruta Hinds which he follows.

## 2016-11-28 ENCOUNTER — Other Ambulatory Visit: Payer: Self-pay | Admitting: Cardiovascular Disease

## 2016-11-29 NOTE — Telephone Encounter (Signed)
Rx(s) sent to pharmacy electronically.  

## 2017-01-25 ENCOUNTER — Other Ambulatory Visit: Payer: Self-pay | Admitting: Cardiovascular Disease

## 2017-01-25 DIAGNOSIS — I714 Abdominal aortic aneurysm, without rupture, unspecified: Secondary | ICD-10-CM

## 2017-01-25 DIAGNOSIS — I739 Peripheral vascular disease, unspecified: Secondary | ICD-10-CM

## 2017-02-03 ENCOUNTER — Ambulatory Visit (HOSPITAL_COMMUNITY)
Admission: RE | Admit: 2017-02-03 | Discharge: 2017-02-03 | Disposition: A | Discharge status: Home / Self Care | Payer: Medicare Other | Source: Ambulatory Visit | Attending: Cardiology | Admitting: Cardiology

## 2017-02-03 DIAGNOSIS — I714 Abdominal aortic aneurysm, without rupture, unspecified: Secondary | ICD-10-CM

## 2017-02-03 DIAGNOSIS — I739 Peripheral vascular disease, unspecified: Secondary | ICD-10-CM | POA: Insufficient documentation

## 2017-02-03 DIAGNOSIS — I700 Atherosclerosis of aorta: Secondary | ICD-10-CM | POA: Diagnosis not present

## 2017-02-04 ENCOUNTER — Other Ambulatory Visit: Payer: Self-pay

## 2017-02-04 DIAGNOSIS — I714 Abdominal aortic aneurysm, without rupture, unspecified: Secondary | ICD-10-CM

## 2017-02-04 DIAGNOSIS — I739 Peripheral vascular disease, unspecified: Secondary | ICD-10-CM

## 2017-06-27 ENCOUNTER — Other Ambulatory Visit: Payer: Self-pay | Admitting: Dermatology

## 2017-06-27 DIAGNOSIS — C4491 Basal cell carcinoma of skin, unspecified: Secondary | ICD-10-CM

## 2017-06-27 HISTORY — DX: Basal cell carcinoma of skin, unspecified: C44.91

## 2017-07-29 ENCOUNTER — Other Ambulatory Visit: Payer: Self-pay | Admitting: Cardiovascular Disease

## 2017-08-31 ENCOUNTER — Other Ambulatory Visit: Payer: Self-pay | Admitting: Cardiovascular Disease

## 2017-09-26 ENCOUNTER — Other Ambulatory Visit: Payer: Self-pay | Admitting: Cardiovascular Disease

## 2017-09-26 NOTE — Telephone Encounter (Signed)
Rx request sent to pharmacy.  

## 2017-10-26 ENCOUNTER — Encounter: Payer: Self-pay | Admitting: Cardiovascular Disease

## 2017-10-26 ENCOUNTER — Ambulatory Visit (INDEPENDENT_AMBULATORY_CARE_PROVIDER_SITE_OTHER): Payer: Medicare Other | Admitting: Cardiovascular Disease

## 2017-10-26 VITALS — BP 100/60 | HR 72 | Ht 63.0 in | Wt 136.6 lb

## 2017-10-26 DIAGNOSIS — I739 Peripheral vascular disease, unspecified: Secondary | ICD-10-CM

## 2017-10-26 DIAGNOSIS — I10 Essential (primary) hypertension: Secondary | ICD-10-CM

## 2017-10-26 DIAGNOSIS — I6523 Occlusion and stenosis of bilateral carotid arteries: Secondary | ICD-10-CM | POA: Diagnosis not present

## 2017-10-26 DIAGNOSIS — E7800 Pure hypercholesterolemia, unspecified: Secondary | ICD-10-CM

## 2017-10-26 MED ORDER — CILOSTAZOL 50 MG PO TABS: 50.0000 mg | ORAL_TABLET | Freq: Two times a day (BID) | ORAL | 0 refills | Status: DC

## 2017-10-26 NOTE — Assessment & Plan Note (Signed)
She has peripheral arterial disease with a known occluded right common iliac artery status post left common iliac artery stenting by myself.  Her most recent Dopplers performed 02/03/2017 showed a patent left common iliac artery stent with a right ABI of 0.59 and a left ABI 0.75.  Her aorta did measure 3.5 cm.  Repeat aortoiliac Doppler studies.

## 2017-10-26 NOTE — Progress Notes (Signed)
10/26/2017 Yong Channel   [birth year]  [id number]  Primary Physician Stephens Shire, MD Primary Cardiologist: Lorretta Harp MD Garret Reddish, [place], [place]  HPI:  Ariana Shea is a 84 y.o.  thin-appearing, divorced Caucasian female, mother of 13, grandmother to 55 grandchildren and 6 step-great-grandchildren who I last saw in the office 06/08/2016 She has a history of hypertension, hyperlipidemia and diabetes, as well as remote tobacco abuse having quit 6 years ago and smoked 55 pack years. She does have a family history of heart disease in a brother who had a massive myocardial infarction. She has had bilateral carotid endarterectomies performed by Dr. Ruta Hinds in the past after an angiogram done November 24, 2006, revealed high-grade bilateral calcified carotid artery stenosis with a bovine arch. She has a known occluded right common iliac artery stenosis and underwent stenting of her left common iliac with an 8 x 27 mm long Express balloon expandable stent. Carotid Dopplers revealed widely patent endarterectomy sites. She has not had lower extremity Dopplers in quite some time and has complained of progressive right calf claudication which is lifestyle limiting over the last year. She denies chest pain or shortness of breath. Her most recent Dopplers performed 07/10/12 were unchanged with a right ABI 0.53, occluded right common iliac, a left ABI of 0.79 with a patent left common iliac artery stent and with left SFA unchanged from prior studies. Since I saw her 1 years ago she's making clinically stable. She denies chest pain or shortness of breath but continues to complain of right lower extremity claudication.      Current Meds  Medication Sig  . carvedilol (COREG) 25 MG tablet Take 1 tablet (25 mg total) by mouth 2 (two) times daily with a meal.  . cilostazol (PLETAL) 50 MG tablet Take 1 tablet (50 mg total) by mouth 2 (two) times daily. Please schedule appointment for refills.  . insulin  glargine (LANTUS) 100 UNIT/ML injection Inject 25 Units into the skin 2 (two) times daily.  . iron polysaccharides (FERREX 150) 150 MG capsule Take 150 mg by mouth 2 (two) times daily.   Marland Kitchen lisinopril (PRINIVIL,ZESTRIL) 20 MG tablet Take 1 tablet by mouth 2 (two) times daily.  . magnesium oxide (MAG-OX) 400 MG tablet Take 1 tablet (400 mg total) by mouth 2 (two) times daily.  Marland Kitchen omeprazole (PRILOSEC) 20 MG capsule Take 1 capsule (20 mg total) by mouth every morning.  . pravastatin (PRAVACHOL) 80 MG tablet Take 1 tablet (80 mg total) by mouth every morning.  . vitamin C (ASCORBIC ACID) 500 MG tablet Take 500 mg by mouth daily.  . [DISCONTINUED] cilostazol (PLETAL) 50 MG tablet Take 1 tablet (50 mg total) by mouth 2 (two) times daily. Please schedule appointment for refills.     Allergies  Allergen Reactions  . Red Blood Cells Other (See Comments)    Patient is a Jehovah's Witness  . Penicillins Rash  . Sulfonamide Derivatives Rash    Social History   Socioeconomic History  . Marital status: Single    Spouse name: Not on file  . Number of children: Not on file  . Years of education: Not on file  . Highest education level: Not on file  Occupational History  . Not on file  Social Needs  . Financial resource strain: Not on file  . Food insecurity:    Worry: Not on file    Inability: Not on file  . Transportation needs:  Medical: Not on file    Non-medical: Not on file  Tobacco Use  . Smoking status: Former Smoker    Packs/day: 1.00    Years: 56.00    Pack years: 56.00    Types: Cigarettes    Last attempt to quit: 11/28/2008    Years since quitting: 8.9  . Smokeless tobacco: Never Used  Substance and Sexual Activity  . Alcohol use: No    Comment: quit  . Drug use: No  . Sexual activity: Not on file  Lifestyle  . Physical activity:    Days per week: Not on file    Minutes per session: Not on file  . Stress: Not on file  Relationships  . Social connections:    Talks on  phone: Not on file    Gets together: Not on file    Attends religious service: Not on file    Active member of club or organization: Not on file    Attends meetings of clubs or organizations: Not on file    Relationship status: Not on file  . Intimate partner violence:    Fear of current or ex partner: Not on file    Emotionally abused: Not on file    Physically abused: Not on file    Forced sexual activity: Not on file  Other Topics Concern  . Not on file  Social History Narrative  . Not on file     Review of Systems: General: negative for chills, fever, night sweats or weight changes.  Cardiovascular: negative for chest pain, dyspnea on exertion, edema, orthopnea, palpitations, paroxysmal nocturnal dyspnea or shortness of breath Dermatological: negative for rash Respiratory: negative for cough or wheezing Urologic: negative for hematuria Abdominal: negative for nausea, vomiting, diarrhea, bright red blood per rectum, melena, or hematemesis Neurologic: negative for visual changes, syncope, or dizziness All other systems reviewed and are otherwise negative except as noted above.    Blood pressure 100/60, pulse 72, height 5\' 3"  (1.6 m), weight 136 lb 9.6 oz (62 kg).  General appearance: alert and no distress Neck: no adenopathy, no carotid bruit, no JVD, supple, symmetrical, trachea midline and thyroid not enlarged, symmetric, no tenderness/mass/nodules Lungs: clear to auscultation bilaterally Heart: regular rate and rhythm, S1, S2 normal, no murmur, click, rub or gallop Extremities: extremities normal, atraumatic, no cyanosis or edema Pulses: Diminished pedal pulses Skin: Skin color, texture, turgor normal. No rashes or lesions Neurologic: Alert and oriented X 3, normal strength and tone. Normal symmetric reflexes. Normal coordination and gait  EKG sinus rhythm at 72 without ST or T wave changes.  I personally reviewed this EKG.  ASSESSMENT AND PLAN:   Hypertension History  of essential hypertension blood pressure measured at 100/60 he is on carvedilol and lisinopril.  Continue current meds at current dosing.  PVD (peripheral vascular disease) ([place]) She has peripheral arterial disease with a known occluded right common iliac artery status post left common iliac artery stenting by myself.  Her most recent Dopplers performed 02/03/2017 showed a patent left common iliac artery stent with a right ABI of 0.59 and a left ABI 0.75.  Her aorta did measure 3.5 cm.  Repeat aortoiliac Doppler studies.  Pure hypercholesterolemia History of hyperlipidemia on statin therapy  Carotid artery disease History of carotid artery disease status post bilateral carotid endarterectomies performed by Dr. Oneida Alar in the past her last carotid Doppler study performed 12/25/2014 revealed widely patent endarterectomy sites.      Lorretta Harp MD [place], [facility] 10/26/2017  12:09 PM

## 2017-10-26 NOTE — Patient Instructions (Signed)
Medication Instructions: Your physician recommends that you continue on your current medications as directed. Please refer to the Current Medication list given to you today.   Testing/Procedures: Your physician has requested that you have a aorta and iliac duplex. During this test, an ultrasound is used to evaluate blood flow to the aorta and iliac arteries. Allow one hour for this exam. Do not eat after midnight the day before and avoid carbonated beverages.   Your physician has requested that you have an ankle brachial index (ABI). During this test an ultrasound and blood pressure cuff are used to evaluate the arteries that supply the arms and legs with blood. Allow thirty minutes for this exam. There are no restrictions or special instructions.  Follow-Up: Your physician wants you to follow-up in: 1 year with Dr. Gwenlyn Found. You will receive a reminder letter in the mail two months in advance. If you don't receive a letter, please call our office to schedule the follow-up appointment.  If you need a refill on your cardiac medications before your next appointment, please call your pharmacy.

## 2017-10-26 NOTE — Assessment & Plan Note (Signed)
History of carotid artery disease status post bilateral carotid endarterectomies performed by Dr. Oneida Alar in the past her last carotid Doppler study performed 12/25/2014 revealed widely patent endarterectomy sites.

## 2017-10-26 NOTE — Assessment & Plan Note (Signed)
History of hyperlipidemia on statin therapy. 

## 2017-10-26 NOTE — Assessment & Plan Note (Signed)
History of essential hypertension blood pressure measured at 100/60 he is on carvedilol and lisinopril.  Continue current meds at current dosing.

## 2017-11-11 ENCOUNTER — Ambulatory Visit (HOSPITAL_COMMUNITY)
Admission: RE | Admit: 2017-11-11 | Discharge: 2017-11-11 | Disposition: A | Discharge status: Home / Self Care | Payer: Medicare Other | Source: Ambulatory Visit | Attending: Cardiology | Admitting: Cardiology

## 2017-11-11 DIAGNOSIS — I714 Abdominal aortic aneurysm, without rupture: Secondary | ICD-10-CM | POA: Diagnosis not present

## 2017-11-11 DIAGNOSIS — Z95828 Presence of other vascular implants and grafts: Secondary | ICD-10-CM | POA: Diagnosis not present

## 2017-11-11 DIAGNOSIS — I739 Peripheral vascular disease, unspecified: Secondary | ICD-10-CM | POA: Insufficient documentation

## 2017-11-15 ENCOUNTER — Other Ambulatory Visit: Payer: Self-pay | Admitting: *Deleted

## 2017-11-15 DIAGNOSIS — I739 Peripheral vascular disease, unspecified: Secondary | ICD-10-CM

## 2017-11-17 ENCOUNTER — Other Ambulatory Visit: Payer: Self-pay | Admitting: Cardiovascular Disease

## 2017-11-17 NOTE — Telephone Encounter (Signed)
Rx request sent to pharmacy.  

## 2017-12-10 ENCOUNTER — Other Ambulatory Visit: Payer: Self-pay | Admitting: Cardiovascular Disease

## 2017-12-12 NOTE — Telephone Encounter (Signed)
Rx request sent to pharmacy.  

## 2017-12-28 ENCOUNTER — Other Ambulatory Visit: Payer: Self-pay | Admitting: Cardiovascular Disease

## 2017-12-28 NOTE — Telephone Encounter (Signed)
Rx has been sent to the pharmacy electronically. ° °

## 2018-03-17 ENCOUNTER — Other Ambulatory Visit: Payer: Self-pay

## 2018-03-17 ENCOUNTER — Encounter (HOSPITAL_COMMUNITY): Payer: Self-pay | Admitting: Emergency Medicine

## 2018-03-17 ENCOUNTER — Emergency Department (HOSPITAL_COMMUNITY): Payer: Medicare Other

## 2018-03-17 ENCOUNTER — Emergency Department (HOSPITAL_COMMUNITY)
Admission: EM | Admit: 2018-03-17 | Discharge: 2018-03-17 | Disposition: A | Discharge status: Home / Self Care | Payer: Medicare Other | Attending: Emergency Medicine | Admitting: Emergency Medicine

## 2018-03-17 DIAGNOSIS — I2510 Atherosclerotic heart disease of native coronary artery without angina pectoris: Secondary | ICD-10-CM | POA: Diagnosis not present

## 2018-03-17 DIAGNOSIS — Z794 Long term (current) use of insulin: Secondary | ICD-10-CM | POA: Diagnosis not present

## 2018-03-17 DIAGNOSIS — Y939 Activity, unspecified: Secondary | ICD-10-CM | POA: Diagnosis not present

## 2018-03-17 DIAGNOSIS — I10 Essential (primary) hypertension: Secondary | ICD-10-CM | POA: Insufficient documentation

## 2018-03-17 DIAGNOSIS — E119 Type 2 diabetes mellitus without complications: Secondary | ICD-10-CM | POA: Insufficient documentation

## 2018-03-17 DIAGNOSIS — Z87891 Personal history of nicotine dependence: Secondary | ICD-10-CM | POA: Insufficient documentation

## 2018-03-17 DIAGNOSIS — Y929 Unspecified place or not applicable: Secondary | ICD-10-CM | POA: Insufficient documentation

## 2018-03-17 DIAGNOSIS — X500XXA Overexertion from strenuous movement or load, initial encounter: Secondary | ICD-10-CM | POA: Insufficient documentation

## 2018-03-17 DIAGNOSIS — S76011A Strain of muscle, fascia and tendon of right hip, initial encounter: Secondary | ICD-10-CM

## 2018-03-17 DIAGNOSIS — S79911A Unspecified injury of right hip, initial encounter: Secondary | ICD-10-CM | POA: Diagnosis present

## 2018-03-17 DIAGNOSIS — Z79899 Other long term (current) drug therapy: Secondary | ICD-10-CM | POA: Diagnosis not present

## 2018-03-17 DIAGNOSIS — Y999 Unspecified external cause status: Secondary | ICD-10-CM | POA: Insufficient documentation

## 2018-03-17 MED ORDER — HYDROCODONE-ACETAMINOPHEN 5-325 MG PO TABS: 1.0000 | ORAL_TABLET | ORAL | 0 refills | Status: DC | PRN

## 2018-03-17 MED ORDER — HYDROCODONE-ACETAMINOPHEN 5-325 MG PO TABS
1.0000 | ORAL_TABLET | Freq: Once | ORAL | Status: AC
  Administered 2018-03-17: 1 via ORAL
  Filled 2018-03-17: qty 1

## 2018-03-17 NOTE — ED Triage Notes (Signed)
Patient in NAD. Delay explained to patient and family.

## 2018-03-17 NOTE — ED Provider Notes (Signed)
[facility] EMERGENCY DEPARTMENT Provider Note   CSN: [id number] Arrival date & time: 03/17/18  1627     History   Chief Complaint Chief Complaint  Patient presents with  . Hip Pain    HPI Ariana Shea is a 84 y.o. female.  Pt presents to the ED today with right hip pain.  She said she was throwing some trash in the garbage dumpster on Tuesday, September 24.  She hurt her hip while doing that.  She denies any fall.  She denies any other pain.  She has been able to walk, but it hurts.       Past Medical History:  Diagnosis Date  . Abdominal aortic aneurysm ([place]) 06/30/2014   Recommend follow-up ultrasound in 2018.  Marland Kitchen Anemia   . Arthritis    HANDS  . Blood transfusion without reported diagnosis   . Diabetes mellitus   . Generalized and unspecified atherosclerosis   . GERD (gastroesophageal reflux disease)   . Hydronephrosis of right kidney 06/26/2014   Chronic UPJ stenosis  . Hypertension   . Hypomagnesemia 06/23/2014   And hypocalcemia, hypophosphatemia, and hypokalemia.  . Peripheral arterial disease ([place])    history of known right common iliac artery occlusion status post left iliac stenting.  . Pneumonia    hx  . Pure hypercholesterolemia   . Swallowing difficulty    gets crumbs caught in throat-told may have to have esophagus streched  . Ulcer   . UTI (lower urinary tract infection)    recent tx     Patient Active Problem List   Diagnosis Date Noted  . Skin infection, left side of neck near carotid endarterectomy scar 11/19/2015  . Hyperlipidemia 12/11/2014  . Abdominal aortic aneurysm ([place]) 06/30/2014  . Normocytic anemia 06/28/2014  . Hypophosphatemia 06/26/2014  . Hydronephrosis of right kidney 06/26/2014  . Renal stones 06/26/2014  . Hypokalemia 06/26/2014  . UTI (urinary tract infection) 06/23/2014  . Hypomagnesemia 06/23/2014  . Hypocalcemia 06/23/2014  . Metabolic acidosis 72/53/6644  . Hypertension 06/22/2014  . DM (diabetes mellitus), type 2  with peripheral vascular complications ([place]) 03/47/4259  . ARF (acute renal failure) ([place]) 06/22/2014  . PVD (peripheral vascular disease) ([place]) 06/22/2014  . GERD (gastroesophageal reflux disease) 06/22/2014  . Pure hypercholesterolemia 06/22/2014  . Nausea vomiting and diarrhea 06/22/2014  . Dysphagia, unspecified(787.20) 11/23/2013  . HTN (hypertension) 06/07/2013  . Carotid artery disease ([place]) 11/30/2012  . Peripheral arterial disease ([place])   . COLONIC POLYPS 08/12/2010  . DM 08/12/2010  . ANEMIA, IRON DEFICIENCY 08/12/2010  . Peripheral vascular disease ([place]) 08/12/2010    Past Surgical History:  Procedure Laterality Date  . ABDOMINAL HYSTERECTOMY    . ARTERY REPAIR Bilateral    corotid artery surgery  . BLADDER SURGERY     tuck  . CAROTID ENDARTERECTOMY     bilateral CEA 2008; (L) 11/2006, (R) 01/2007 with reperfusion phenomenon (seizure) post-operaitvely  . CHOLECYSTECTOMY    . MULTIPLE EXTRACTIONS WITH ALVEOLOPLASTY N/A 12/07/2013   Procedure: MULTIPLE EXTRACTION WITH ALVEOLOPLASTY;  Surgeon: Gae Bon, DDS;  Location: [place];  Service: Oral Surgery;  Laterality: N/A;     OB History    Gravida  3   Para  3   Term  3   Preterm      AB      Living        SAB      TAB      Ectopic      Multiple  Live Births               Home Medications    Prior to Admission medications   Medication Sig Start Date End Date Taking? Authorizing Provider  carvedilol (COREG) 25 MG tablet Take 1 tablet (25 mg total) by mouth 2 (two) times daily with a meal. 12/11/14   Lorretta Harp, MD  cilostazol (PLETAL) 50 MG tablet TAKE 1 TABLET BY MOUTH TWICE A DAY 12/28/17   Lorretta Harp, MD  HYDROcodone-acetaminophen (NORCO/VICODIN) 5-325 MG tablet Take 1 tablet by mouth every 4 (four) hours as needed. 03/17/18   Isla Pence, MD  insulin glargine (LANTUS) 100 UNIT/ML injection Inject 25 Units into the skin 2 (two) times daily.    [provider]    iron polysaccharides (FERREX 150) 150 MG capsule Take 150 mg by mouth 2 (two) times daily.     [provider]  lisinopril (PRINIVIL,ZESTRIL) 20 MG tablet Take 1 tablet by mouth 2 (two) times daily. 09/28/17   [provider]  magnesium oxide (MAG-OX) 400 MG tablet Take 1 tablet (400 mg total) by mouth 2 (two) times daily. 06/30/14   Rexene Alberts, MD  omeprazole (PRILOSEC) 20 MG capsule Take 1 capsule (20 mg total) by mouth every morning. 12/11/14   Lorretta Harp, MD  pravastatin (PRAVACHOL) 80 MG tablet Take 1 tablet (80 mg total) by mouth every morning. 02/25/15   Lorretta Harp, MD  vitamin C (ASCORBIC ACID) 500 MG tablet Take 500 mg by mouth daily.    [provider]    Family History Family History  Problem Relation Age of Onset  . Heart disease Father     Social History Social History   Tobacco Use  . Smoking status: Former Smoker    Packs/day: 1.00    Years: 56.00    Pack years: 56.00    Types: Cigarettes    Last attempt to quit: 11/28/2008    Years since quitting: 9.3  . Smokeless tobacco: Never Used  Substance Use Topics  . Alcohol use: No    Comment: quit  . Drug use: No     Allergies   Red blood cells; Penicillins; and Sulfonamide derivatives   Review of Systems Review of Systems  Musculoskeletal:       Right hip pain  All other systems reviewed and are negative.    Physical Exam Updated Vital Signs BP (!) 187/93 (BP Location: Left Arm) Comment: hx of htn. has not taken night dose of bp med yet.  Pulse 73   Temp 97.9 F (36.6 C) (Oral)   Resp 16   Ht 5\' 3"  (1.6 m)   Wt 60.8 kg   SpO2 96%   BMI 23.74 kg/m   Physical Exam  Constitutional: She is oriented to person, place, and time. She appears well-developed and well-nourished.  HENT:  Head: Normocephalic and atraumatic.  Right Ear: External ear normal.  Left Ear: External ear normal.  Nose: Nose normal.  Mouth/Throat: Oropharynx is clear and moist.  Eyes:  Pupils are equal, round, and reactive to light. Conjunctivae and EOM are normal.  Neck: Normal range of motion. Neck supple.  Cardiovascular: Normal rate, regular rhythm, normal heart sounds and intact distal pulses.  Pulmonary/Chest: Effort normal and breath sounds normal.  Abdominal: Soft. Bowel sounds are normal.  Musculoskeletal:       Legs: Neurological: She is alert and oriented to person, place, and time.  Skin: Skin is warm and dry. Capillary  refill takes less than 2 seconds.  Psychiatric: She has a normal mood and affect. Her behavior is normal. Judgment and thought content normal.  Nursing note and vitals reviewed.    ED Treatments / Results  Labs (all labs ordered are listed, but only abnormal results are displayed) Labs Reviewed - No data to display  EKG None  Radiology Dg Hip Unilat W Or Wo Pelvis 2-3 Views Right  Result Date: 03/17/2018 CLINICAL DATA:  Right hip pain EXAM: DG HIP (WITH OR WITHOUT PELVIS) 2-3V RIGHT COMPARISON:  None. FINDINGS: No fracture or dislocation is seen. Bilateral hip joint spaces are symmetric. Visualized bony pelvis appears intact. Degenerative changes of the lower lumbar spine. Vascular calcifications. IMPRESSION: Negative. Electronically Signed   By: Julian Hy M.D.   On: 03/17/2018 19:21    Procedures Procedures (including critical care time)  Medications Ordered in ED Medications  HYDROcodone-acetaminophen (NORCO/VICODIN) 5-325 MG per tablet 1 tablet (1 tablet Oral Given 03/17/18 2007)     Initial Impression / Assessment and Plan / ED Course  I have reviewed the triage vital signs and the nursing notes.  Pertinent labs & imaging results that were available during my care of the patient were reviewed by me and considered in my medical decision making (see chart for details).    Pt does not have a fracture.  She is able to ambulate.  Pt likely strained a muscle when throwing out the trash.  Pt is encouraged to f/u with ortho.   Return if worse.  Final Clinical Impressions(s) / ED Diagnoses   Final diagnoses:  Strain of right hip, initial encounter    ED Discharge Orders         Ordered    HYDROcodone-acetaminophen (NORCO/VICODIN) 5-325 MG tablet  Every 4 hours PRN     03/17/18 2004           Isla Pence, MD 03/17/18 2008

## 2018-03-17 NOTE — ED Notes (Signed)
Pt discharged to home via wheel chair with son driving. Pt and son aware of elevated blood pressure. Pt and son verbalized understanding and compliance to taking HS blood pressure medication upon return to home.

## 2018-03-17 NOTE — ED Triage Notes (Signed)
Patient c/o pain in her R hip area after throwing garbage into a dumpster on Tuesday.

## 2018-03-22 DIAGNOSIS — M25551 Pain in right hip: Secondary | ICD-10-CM | POA: Diagnosis not present

## 2018-04-07 DIAGNOSIS — M545 Low back pain: Secondary | ICD-10-CM | POA: Diagnosis not present

## 2018-04-14 DIAGNOSIS — M545 Low back pain: Secondary | ICD-10-CM | POA: Diagnosis not present

## 2018-04-18 ENCOUNTER — Other Ambulatory Visit: Payer: Self-pay | Admitting: Dermatology

## 2018-04-18 DIAGNOSIS — D229 Melanocytic nevi, unspecified: Secondary | ICD-10-CM | POA: Diagnosis not present

## 2018-04-18 DIAGNOSIS — D485 Neoplasm of uncertain behavior of skin: Secondary | ICD-10-CM | POA: Diagnosis not present

## 2018-04-19 DIAGNOSIS — M79604 Pain in right leg: Secondary | ICD-10-CM | POA: Diagnosis not present

## 2018-04-19 DIAGNOSIS — M545 Low back pain: Secondary | ICD-10-CM | POA: Diagnosis not present

## 2018-04-19 DIAGNOSIS — M5416 Radiculopathy, lumbar region: Secondary | ICD-10-CM | POA: Diagnosis not present

## 2018-04-26 ENCOUNTER — Other Ambulatory Visit: Payer: Self-pay | Admitting: Cardiovascular Disease

## 2018-08-28 ENCOUNTER — Other Ambulatory Visit: Payer: Self-pay | Admitting: Cardiovascular Disease

## 2018-11-08 ENCOUNTER — Telehealth: Payer: Self-pay | Admitting: *Deleted

## 2018-11-08 NOTE — Telephone Encounter (Signed)
Unable to leave a message, mail box full. 

## 2018-11-15 NOTE — Telephone Encounter (Signed)
Unable to leave a message.

## 2018-11-17 ENCOUNTER — Other Ambulatory Visit (HOSPITAL_COMMUNITY): Payer: Self-pay | Admitting: Cardiovascular Disease

## 2018-11-17 DIAGNOSIS — I739 Peripheral vascular disease, unspecified: Secondary | ICD-10-CM

## 2018-11-28 NOTE — Telephone Encounter (Signed)
Unable to leave a message.

## 2018-12-01 ENCOUNTER — Ambulatory Visit (HOSPITAL_BASED_OUTPATIENT_CLINIC_OR_DEPARTMENT_OTHER)
Admission: RE | Admit: 2018-12-01 | Discharge: 2018-12-01 | Disposition: A | Discharge status: Home / Self Care | Payer: Medicare Other | Source: Ambulatory Visit | Attending: Cardiovascular Disease | Admitting: Cardiovascular Disease

## 2018-12-01 ENCOUNTER — Other Ambulatory Visit: Payer: Self-pay

## 2018-12-01 ENCOUNTER — Other Ambulatory Visit: Payer: Self-pay | Admitting: Cardiovascular Disease

## 2018-12-01 ENCOUNTER — Other Ambulatory Visit (HOSPITAL_COMMUNITY): Payer: Self-pay | Admitting: Cardiovascular Disease

## 2018-12-01 ENCOUNTER — Ambulatory Visit (HOSPITAL_COMMUNITY)
Admission: RE | Admit: 2018-12-01 | Discharge: 2018-12-01 | Disposition: A | Discharge status: Home / Self Care | Payer: Medicare Other | Source: Ambulatory Visit | Attending: Cardiovascular Disease | Admitting: Cardiovascular Disease

## 2018-12-01 DIAGNOSIS — I739 Peripheral vascular disease, unspecified: Secondary | ICD-10-CM

## 2018-12-04 ENCOUNTER — Other Ambulatory Visit: Payer: Self-pay

## 2018-12-04 NOTE — Progress Notes (Signed)
Notes recorded by Lorretta Harp, MD on 12/01/2018 at 5:02 PM EDT  No change from prior study. Repeat in 12 months. Follow-up left common iliac stent and right common iliac occlusion.

## 2018-12-18 ENCOUNTER — Other Ambulatory Visit: Payer: Self-pay

## 2018-12-18 MED ORDER — CILOSTAZOL 50 MG PO TABS: 50.0000 mg | ORAL_TABLET | Freq: Two times a day (BID) | ORAL | 1 refills | Status: DC

## 2019-04-24 ENCOUNTER — Other Ambulatory Visit: Payer: Self-pay | Admitting: Cardiovascular Disease

## 2019-04-24 NOTE — Telephone Encounter (Signed)
Rx has been sent to the pharmacy electronically. ° °

## 2019-08-02 ENCOUNTER — Other Ambulatory Visit: Payer: Self-pay | Admitting: Cardiovascular Disease

## 2019-08-02 NOTE — Telephone Encounter (Signed)
Rx has been sent to the pharmacy electronically. ° °

## 2019-09-13 ENCOUNTER — Other Ambulatory Visit: Payer: Self-pay | Admitting: Cardiovascular Disease

## 2019-10-17 ENCOUNTER — Other Ambulatory Visit: Payer: Self-pay | Admitting: Cardiovascular Disease

## 2019-10-23 ENCOUNTER — Encounter (HOSPITAL_COMMUNITY): Payer: Self-pay | Admitting: Emergency Medicine

## 2019-10-23 ENCOUNTER — Inpatient Hospital Stay (HOSPITAL_COMMUNITY)
Admission: EM | Admit: 2019-10-23 | Discharge: 2019-10-27 | DRG: 683 | Disposition: A | Discharge status: Skilled Nursing Facility | Payer: Medicare Other | Attending: Internal Medicine | Admitting: Internal Medicine

## 2019-10-23 ENCOUNTER — Other Ambulatory Visit: Payer: Self-pay

## 2019-10-23 DIAGNOSIS — N136 Pyonephrosis: Secondary | ICD-10-CM | POA: Diagnosis present

## 2019-10-23 DIAGNOSIS — Z20822 Contact with and (suspected) exposure to covid-19: Secondary | ICD-10-CM | POA: Diagnosis present

## 2019-10-23 DIAGNOSIS — N179 Acute kidney failure, unspecified: Secondary | ICD-10-CM | POA: Diagnosis not present

## 2019-10-23 DIAGNOSIS — E785 Hyperlipidemia, unspecified: Secondary | ICD-10-CM | POA: Diagnosis present

## 2019-10-23 DIAGNOSIS — E870 Hyperosmolality and hypernatremia: Secondary | ICD-10-CM | POA: Diagnosis present

## 2019-10-23 DIAGNOSIS — N1832 Chronic kidney disease, stage 3b: Secondary | ICD-10-CM | POA: Diagnosis present

## 2019-10-23 DIAGNOSIS — D509 Iron deficiency anemia, unspecified: Secondary | ICD-10-CM | POA: Diagnosis present

## 2019-10-23 DIAGNOSIS — N390 Urinary tract infection, site not specified: Secondary | ICD-10-CM

## 2019-10-23 DIAGNOSIS — K219 Gastro-esophageal reflux disease without esophagitis: Secondary | ICD-10-CM | POA: Diagnosis present

## 2019-10-23 DIAGNOSIS — E7800 Pure hypercholesterolemia, unspecified: Secondary | ICD-10-CM | POA: Diagnosis present

## 2019-10-23 DIAGNOSIS — Z87891 Personal history of nicotine dependence: Secondary | ICD-10-CM

## 2019-10-23 DIAGNOSIS — I714 Abdominal aortic aneurysm, without rupture: Secondary | ICD-10-CM | POA: Diagnosis present

## 2019-10-23 DIAGNOSIS — N10 Acute tubulo-interstitial nephritis: Secondary | ICD-10-CM

## 2019-10-23 DIAGNOSIS — Z8249 Family history of ischemic heart disease and other diseases of the circulatory system: Secondary | ICD-10-CM

## 2019-10-23 DIAGNOSIS — Z882 Allergy status to sulfonamides status: Secondary | ICD-10-CM

## 2019-10-23 DIAGNOSIS — E1122 Type 2 diabetes mellitus with diabetic chronic kidney disease: Secondary | ICD-10-CM | POA: Diagnosis present

## 2019-10-23 DIAGNOSIS — E1151 Type 2 diabetes mellitus with diabetic peripheral angiopathy without gangrene: Secondary | ICD-10-CM | POA: Diagnosis present

## 2019-10-23 DIAGNOSIS — Z794 Long term (current) use of insulin: Secondary | ICD-10-CM

## 2019-10-23 DIAGNOSIS — Z79899 Other long term (current) drug therapy: Secondary | ICD-10-CM

## 2019-10-23 DIAGNOSIS — E875 Hyperkalemia: Secondary | ICD-10-CM | POA: Diagnosis present

## 2019-10-23 DIAGNOSIS — Z880 Allergy status to penicillin: Secondary | ICD-10-CM

## 2019-10-23 DIAGNOSIS — I129 Hypertensive chronic kidney disease with stage 1 through stage 4 chronic kidney disease, or unspecified chronic kidney disease: Secondary | ICD-10-CM | POA: Diagnosis present

## 2019-10-23 DIAGNOSIS — Z90710 Acquired absence of both cervix and uterus: Secondary | ICD-10-CM

## 2019-10-23 DIAGNOSIS — B961 Klebsiella pneumoniae [K. pneumoniae] as the cause of diseases classified elsewhere: Secondary | ICD-10-CM | POA: Diagnosis present

## 2019-10-23 DIAGNOSIS — Z95820 Peripheral vascular angioplasty status with implants and grafts: Secondary | ICD-10-CM

## 2019-10-23 DIAGNOSIS — Z85828 Personal history of other malignant neoplasm of skin: Secondary | ICD-10-CM

## 2019-10-23 DIAGNOSIS — E872 Acidosis: Secondary | ICD-10-CM | POA: Diagnosis present

## 2019-10-23 DIAGNOSIS — N183 Chronic kidney disease, stage 3 unspecified: Secondary | ICD-10-CM

## 2019-10-23 NOTE — ED Provider Notes (Addendum)
[facility] EMERGENCY DEPARTMENT Provider Note   CSN: [id number] Arrival date & time: 10/23/19  2128     History Chief Complaint  Patient presents with  . Back Pain    Ariana Shea is a 86 y.o. female.  Patient presents to the emergency department for evaluation of right lower back pain that has been present with changes in position and movement ever since she took the garbage out the other night.  Pain does not radiate.  No loss of strength or sensation in lower extremities.  No urinary symptoms.        Past Medical History:  Diagnosis Date  . Abdominal aortic aneurysm ([place]) 06/30/2014   Recommend follow-up ultrasound in 2018.  Marland Kitchen Anemia   . Arthritis    HANDS  . BCC (basal cell carcinoma of skin) 12/16/2015   Right Ant. Shoulder   . Blood transfusion without reported diagnosis   . Diabetes mellitus   . Generalized and unspecified atherosclerosis   . GERD (gastroesophageal reflux disease)   . Hydronephrosis of right kidney 06/26/2014   Chronic UPJ stenosis  . Hypertension   . Hypomagnesemia 06/23/2014   And hypocalcemia, hypophosphatemia, and hypokalemia.  . Nodular basal cell carcinoma (BCC) 06/27/2017   Behind Left Ear  . Peripheral arterial disease ([place])    history of known right common iliac artery occlusion status post left iliac stenting.  . Pneumonia    hx  . Pure hypercholesterolemia   . Superficial nodular basal cell carcinoma (BCC) 12/16/2015   Left Ant. Neck  . Superficial nodular basal cell carcinoma (BCC) 06/27/2017   Behing Right Ear  . Swallowing difficulty    gets crumbs caught in throat-told may have to have esophagus streched  . Ulcer   . UTI (lower urinary tract infection)    recent tx     Patient Active Problem List   Diagnosis Date Noted  . Skin infection, left side of neck near carotid endarterectomy scar 11/19/2015  . Hyperlipidemia 12/11/2014  . Abdominal aortic aneurysm ([place]) 06/30/2014  . Normocytic anemia 06/28/2014  .  Hypophosphatemia 06/26/2014  . Hydronephrosis of right kidney 06/26/2014  . Renal stones 06/26/2014  . Hypokalemia 06/26/2014  . UTI (urinary tract infection) 06/23/2014  . Hypomagnesemia 06/23/2014  . Hypocalcemia 06/23/2014  . Metabolic acidosis 99991111  . Hypertension 06/22/2014  . DM (diabetes mellitus), type 2 with peripheral vascular complications ([place]) [id number]  . ARF (acute renal failure) ([place]) 06/22/2014  . PVD (peripheral vascular disease) ([place]) 06/22/2014  . GERD (gastroesophageal reflux disease) 06/22/2014  . Pure hypercholesterolemia 06/22/2014  . Nausea vomiting and diarrhea 06/22/2014  . Dysphagia, unspecified(787.20) 11/23/2013  . HTN (hypertension) 06/07/2013  . Carotid artery disease ([place]) 11/30/2012  . Peripheral arterial disease ([place])   . COLONIC POLYPS 08/12/2010  . DM 08/12/2010  . ANEMIA, IRON DEFICIENCY 08/12/2010  . Peripheral vascular disease ([place]) 08/12/2010    Past Surgical History:  Procedure Laterality Date  . ABDOMINAL HYSTERECTOMY    . ARTERY REPAIR Bilateral    corotid artery surgery  . BLADDER SURGERY     tuck  . CAROTID ENDARTERECTOMY     bilateral CEA 2008; (L) 11/2006, (R) 01/2007 with reperfusion phenomenon (seizure) post-operaitvely  . CHOLECYSTECTOMY    . MULTIPLE EXTRACTIONS WITH ALVEOLOPLASTY N/A 12/07/2013   Procedure: MULTIPLE EXTRACTION WITH ALVEOLOPLASTY;  Surgeon: Gae Bon, DDS;  Location: [place];  Service: Oral Surgery;  Laterality: N/A;     OB History    Gravida  3  Para  3   Term  3   Preterm      AB      Living        SAB      TAB      Ectopic      Multiple      Live Births              Family History  Problem Relation Age of Onset  . Heart disease Father     Social History   Tobacco Use  . Smoking status: Former Smoker    Packs/day: 1.00    Years: 56.00    Pack years: 56.00    Types: Cigarettes    Quit date: 11/28/2008    Years since quitting: 10.9  . Smokeless tobacco: Never  Used  Substance Use Topics  . Alcohol use: No    Comment: quit  . Drug use: No    Home Medications Prior to Admission medications   Medication Sig Start Date End Date Taking? Authorizing Provider  carvedilol (COREG) 25 MG tablet Take 1 tablet (25 mg total) by mouth 2 (two) times daily with a meal. 12/11/14   Lorretta Harp, MD  cilostazol (PLETAL) 50 MG tablet Take 1 tablet (50 mg total) by mouth 2 (two) times daily. 10/19/19   Lorretta Harp, MD  HYDROcodone-acetaminophen (NORCO/VICODIN) 5-325 MG tablet Take 1 tablet by mouth every 4 (four) hours as needed. 03/17/18   Isla Pence, MD  insulin glargine (LANTUS) 100 UNIT/ML injection Inject 25 Units into the skin 2 (two) times daily.    [provider]  iron polysaccharides (FERREX 150) 150 MG capsule Take 150 mg by mouth 2 (two) times daily.     [provider]  lisinopril (PRINIVIL,ZESTRIL) 20 MG tablet Take 1 tablet by mouth 2 (two) times daily. 09/28/17   [provider]  magnesium oxide (MAG-OX) 400 MG tablet Take 1 tablet (400 mg total) by mouth 2 (two) times daily. 06/30/14   Rexene Alberts, MD  omeprazole (PRILOSEC) 20 MG capsule Take 1 capsule (20 mg total) by mouth every morning. 12/11/14   Lorretta Harp, MD  pravastatin (PRAVACHOL) 80 MG tablet Take 1 tablet (80 mg total) by mouth every morning. 02/25/15   Lorretta Harp, MD  vitamin C (ASCORBIC ACID) 500 MG tablet Take 500 mg by mouth daily.    [provider]    Allergies    Red blood cells, Penicillins, and Sulfonamide derivatives  Review of Systems   Review of Systems  Musculoskeletal: Positive for back pain.  Neurological: Negative.   All other systems reviewed and are negative.   Physical Exam Updated Vital Signs BP (!) 126/46   Pulse 86   Temp 98 F (36.7 C) (Oral)   Resp 15   Ht 5\' 3"  (1.6 m)   Wt 59 kg   SpO2 96%   BMI 23.03 kg/m   Physical Exam Vitals and nursing note reviewed.  Constitutional:       General: She is not in acute distress.    Appearance: Normal appearance. She is well-developed.  HENT:     Head: Normocephalic and atraumatic.     Right Ear: Hearing normal.     Left Ear: Hearing normal.     Nose: Nose normal.  Eyes:     Conjunctiva/sclera: Conjunctivae normal.     Pupils: Pupils are equal, round, and reactive to light.  Cardiovascular:     Rate and Rhythm: Regular rhythm.  Heart sounds: S1 normal and S2 normal. No murmur. No friction rub. No gallop.      Comments: Diminished pulses bilateral lower extremities, normal skin color, normal capillary refill, warm to the touch Pulmonary:     Effort: Pulmonary effort is normal. No respiratory distress.     Breath sounds: Normal breath sounds.  Chest:     Chest wall: No tenderness.  Abdominal:     General: Bowel sounds are normal.     Palpations: Abdomen is soft.     Tenderness: There is no abdominal tenderness. There is no guarding or rebound. Negative signs include Murphy's sign and McBurney's sign.     Hernia: No hernia is present.  Musculoskeletal:        General: Normal range of motion.     Cervical back: Normal range of motion and neck supple.     Lumbar back: Spasms and tenderness present. Negative right straight leg raise test and negative left straight leg raise test.       Back:     Comments: Point tenderness right lumbar paraspinal muscles  Skin:    General: Skin is warm and dry.     Findings: No rash.  Neurological:     Mental Status: She is alert and oriented to person, place, and time.     GCS: GCS eye subscore is 4. GCS verbal subscore is 5. GCS motor subscore is 6.     Cranial Nerves: No cranial nerve deficit.     Sensory: No sensory deficit.     Coordination: Coordination normal.     Deep Tendon Reflexes:     Reflex Scores:      Patellar reflexes are 2+ on the right side and 2+ on the left side. Psychiatric:        Speech: Speech normal.        Behavior: Behavior normal.        Thought  Content: Thought content normal.     ED Results / Procedures / Treatments   Labs (all labs ordered are listed, but only abnormal results are displayed) Labs Reviewed  URINALYSIS, ROUTINE W REFLEX MICROSCOPIC - Abnormal; Notable for the following components:      Result Value   APPearance CLOUDY (*)    Ketones, ur 5 (*)    Protein, ur 30 (*)    Leukocytes,Ua LARGE (*)    WBC, UA >50 (*)    Bacteria, UA MANY (*)    All other components within normal limits  BASIC METABOLIC PANEL - Abnormal; Notable for the following components:   Sodium 134 (*)    Potassium 6.1 (*)    Chloride 113 (*)    CO2 13 (*)    BUN 114 (*)    Creatinine, Ser 2.73 (*)    GFR calc non Af Amer 15 (*)    GFR calc Af Amer 18 (*)    All other components within normal limits  URINE CULTURE  RESPIRATORY PANEL BY RT PCR (FLU A&B, COVID)  CBC    EKG None ED ECG REPORT   Date: 10/24/2019  Rate: 87  Rhythm: normal sinus rhythm  QRS Axis: left  Intervals: normal  ST/T Wave abnormalities: normal  Conduction Disutrbances:none  Narrative Interpretation:   Old EKG Reviewed: unchanged  I have personally reviewed the EKG tracing and agree with the computerized printout as noted.   Radiology CT ABDOMEN PELVIS WO CONTRAST  Result Date: 10/24/2019 CLINICAL DATA:  AAA on x-ray EXAM: CT ABDOMEN AND PELVIS  WITHOUT CONTRAST TECHNIQUE: Multidetector CT imaging of the abdomen and pelvis was performed following the standard protocol without IV contrast. COMPARISON:  Same-day lumbar radiograph, CT renal colic [id number] FINDINGS: Lower chest: Bandlike opacities in the lung bases compatible with atelectasis and/or scarring Normal heart size. No pericardial effusion. Calcifications of the coronary arteries and mitral annulus. Hepatobiliary: No focal liver abnormality is seen. Patient is post cholecystectomy. Slight prominence of the biliary tree likely related to reservoir effect. No calcified intraductal gallstones.  Pancreas: Unremarkable. No pancreatic ductal dilatation or surrounding inflammatory changes. Spleen: Stable calcified lesion in the spleen, almost certainly benign. Could reflect a partially calcified granuloma versus hemangioma or lymphangioma given a slightly hypoattenuating appearance on comparison imaging. Otherwise normal spleen. Adrenals/Urinary Tract: Normal adrenal glands. Mild bilateral perinephric stranding, left slightly greater than right. Chronically dilated right extrarenal pelvis likely secondary to UPJ obstruction. Bilateral urothelial thickening is noted of the proximal ureters and collecting systems. There are bilateral punctate calcifications likely reflecting either vascular calcium rich tiny collecting system calculi without visible obstructive urolithiasis at this time. No worrisome renal mass. Urinary bladder appears grossly unremarkable without significant wall thickening or stranding. Stomach/Bowel: Distal esophagus, stomach and duodenal sweep are unremarkable. No small bowel wall thickening or dilatation. No evidence of obstruction. Numerous fecaliths are present within the otherwise normal appendix in the right lower quadrant. There is pancolonic diverticulosis. No convincing evidence of acute diverticulitis. Pelvic floor laxity with possible rectocele. Vascular/Lymphatic: There is extensive severe atherosclerotic calcification throughout the aorta and branch vessels. There is mild lobular fusiform dilatation at the level the celiac access and renal artery origins measuring up to 2.8 cm. Larger bilobed infrarenal abdominal aortic aneurysm measures up to 3.7 cm in size. Increased diameter on the same day lumbar radiographs may be spuriously increased with radiographic technique. Some hypodense mural thrombus is noted with partial calcification. No periaortic stranding or acute findings to suggest threatened rupture. Left iliac artery stenting is noted. Extensive calcification of the inflow  and outflow vessels is seen. Reproductive: Patient is post hysterectomy. Radiopaque pessary device noted within the vaginal canal. Other: No abdominopelvic free fluid or air. No bowel containing hernias. Pelvic floor laxity as above. Musculoskeletal: The osseous structures appear diffusely demineralized which may limit detection of small or nondisplaced fractures. No acute osseous abnormality or suspicious osseous lesion. Levocurvature of the lumbar spine is similar to comparison is. Multilevel degenerative changes are present in the imaged portions of the spine. IMPRESSION: 1. Bilobed infrarenal abdominal aortic aneurysm measures up to 3.7 cm in size. Increased diameter on the same day lumbar radiographs may be spuriously increased with radiographic technique. No periaortic stranding or acute findings to suggest threatened rupture. Pararenal fusiform dilatation is stable as well. 2. Left iliac artery stenting. Luminal evaluation of the vasculature cannot be assessed in the absence of contrast media. 3. Chronically dilated right extrarenal pelvis likely secondary to UPJ obstruction. Bilateral urothelial thickening is noted of the proximal ureters and collecting systems, nonspecific but can be seen with a ascending urinary tract infection. Correlate with urinalysis. 4. Bilateral punctate calcifications likely reflecting either vascular calcium rich tiny collecting system calculi without visible obstructive urolithiasis at this time. 5. Pancolonic diverticulosis without convincing evidence of acute diverticulitis. 6. Pelvic floor laxity with possible rectocele. Correlate with symptoms. 7. Aortic Atherosclerosis (ICD10-I70.0). Electronically Signed   By: Lovena Le M.D.   On: 10/24/2019 04:04   DG Lumbar Spine Complete  Result Date: 10/24/2019 CLINICAL DATA:  Low back pain, difficulty with ambulation,  no known injury EXAM: LUMBAR SPINE - COMPLETE 4+ VIEW COMPARISON:  CT 06/25/2014 FINDINGS: The osseous structures  appear diffusely demineralized which may limit detection of small or nondisplaced fractures. Five lumbar type vertebral bodies are visualized. There is levocurvature of the lumbar spine with an apex at the L3 level. Mild stepwise retrolisthesis L2-L4 with grade 1 anterolisthesis L4 on L5. Findings on a likely degenerative basis given advanced facet arthropathic changes throughout the lumbar spine and multilevel disc height loss with vacuum disc and discogenic endplate changes. 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 Additional degenerative changes of the hips and SI joints. Extensive aortoiliac atherosclerosis with a bilobed abdominal aortic aneurysm measuring up to 4.7 cm in diameter, increased from 3.2 cm on comparison CT from 2016. Additional vascular calcium throughout the abdomen and pelvis. Few phleboliths in the pelvis as well. Normal bowel gas pattern. IMPRESSION: 1. Osseous demineralization, curvature, and advanced degenerative changes may limit detection of subtle fracture. No definite acute osseous injury. 2. Mild stepwise retrolisthesis L2-L4 with grade 1 anterolisthesis L4 on L5. 3. Moderate discogenic and facet degenerative changes throughout the lumbar spine. 4. Enlarging bilobed abdominal aortic aneurysm measuring up to 4.7 cm at this time. Angiographic imaging may be of clinical utility in the setting of atraumatic acute onset low back pain. Critical Value/emergent results were called by telephone at the time of interpretation on 10/24/2019 at 1:37 am to provider [facility] , who verbally acknowledged these results. Electronically Signed   By: Lovena Le M.D.   On: 10/24/2019 01:35    Procedures Procedures (including critical care time)  Medications Ordered in ED Medications  sodium zirconium cyclosilicate (LOKELMA) packet 10 g (has no administration in time range)  sodium chloride 0.9 % bolus 500 mL (has no administration in time range)  calcium  gluconate 1 g/ 50 mL sodium chloride IVPB (has no administration in time range)  cefTRIAXone (ROCEPHIN) injection 1 g (1 g Intramuscular Given 10/24/19 0127)  lidocaine HCl (PF) (XYLOCAINE) 2 % injection (2.1 mLs  Given 10/24/19 0130)    ED Course  I have reviewed the triage vital signs and the nursing notes.  Pertinent labs & imaging results that were available during my care of the patient were reviewed by me and considered in my medical decision making (see chart for details).    MDM Rules/Calculators/A&P                      Patient presents to the emergency department for evaluation of right lower back pain. Patient reports pain is mostly present when she moves. Pain started after she took the garbage out several days ago. This seems consistent with musculoskeletal strain. No midline tenderness. She has not noticed any urinary symptoms, however, urinalysis looks grossly abnormal.  Urine culture sent and patient administered Rocephin.    Patient underwent x-ray of lumbar spine.  No acute lumbar spine findings, however there was evidence of infrarenal AAA.  It was felt that the patient would need further evaluation of this with her atraumatic low back pain.  Lab work was ordered.  She was found to be in acute renal failure.  She therefore cannot get IV dye, CT without contrast performed.  There was no evidence of rupture.  Patient administered IV calcium, p.o. Lokelma, IV fluids for her renal failure with hyperkalemia.  I was unable to reach nephrology, patient will need nephrology consultation eventually. Will admit to hospitalist.  CRITICAL CARE Performed by: Gwenyth Allegra  Amol Domanski   Total critical care time: 30 minutes  Critical care time was exclusive of separately billable procedures and treating other patients.  Critical care was necessary to treat or prevent imminent or life-threatening deterioration.  Critical care was time spent personally by me on the following activities:  development of treatment plan with patient and/or surrogate as well as nursing, discussions with consultants, evaluation of patient's response to treatment, examination of patient, obtaining history from patient or surrogate, ordering and performing treatments and interventions, ordering and review of laboratory studies, ordering and review of radiographic studies, pulse oximetry and re-evaluation of patient's condition.  Addendum: Dr. Joelyn Oms, nephrology, has returned my call and agrees with current treatment plan, will follow.  Final Clinical Impression(s) / ED Diagnoses Final diagnoses:  Acute renal failure, unspecified acute renal failure type ([place])  Urinary tract infection without hematuria, site unspecified  Hyperkalemia    Rx / DC Orders ED Discharge Orders           Orpah Greek, MD 10/24/19 [id number]    Orpah Greek, MD 10/24/19 0510    Orpah Greek, MD 10/24/19 [phone]

## 2019-10-23 NOTE — ED Triage Notes (Signed)
Pt c/o lower back, arm, and leg pain when she walks since Sunday.

## 2019-10-24 ENCOUNTER — Emergency Department (HOSPITAL_COMMUNITY): Payer: Medicare Other

## 2019-10-24 ENCOUNTER — Inpatient Hospital Stay (HOSPITAL_COMMUNITY): Payer: Medicare Other

## 2019-10-24 ENCOUNTER — Encounter (HOSPITAL_COMMUNITY): Payer: Self-pay | Admitting: Radiology

## 2019-10-24 DIAGNOSIS — E870 Hyperosmolality and hypernatremia: Secondary | ICD-10-CM | POA: Diagnosis present

## 2019-10-24 DIAGNOSIS — K219 Gastro-esophageal reflux disease without esophagitis: Secondary | ICD-10-CM | POA: Diagnosis present

## 2019-10-24 DIAGNOSIS — Z90710 Acquired absence of both cervix and uterus: Secondary | ICD-10-CM | POA: Diagnosis not present

## 2019-10-24 DIAGNOSIS — Z794 Long term (current) use of insulin: Secondary | ICD-10-CM | POA: Diagnosis not present

## 2019-10-24 DIAGNOSIS — E1151 Type 2 diabetes mellitus with diabetic peripheral angiopathy without gangrene: Secondary | ICD-10-CM | POA: Diagnosis present

## 2019-10-24 DIAGNOSIS — N1832 Chronic kidney disease, stage 3b: Secondary | ICD-10-CM

## 2019-10-24 DIAGNOSIS — Z8249 Family history of ischemic heart disease and other diseases of the circulatory system: Secondary | ICD-10-CM | POA: Diagnosis not present

## 2019-10-24 DIAGNOSIS — E7800 Pure hypercholesterolemia, unspecified: Secondary | ICD-10-CM | POA: Diagnosis present

## 2019-10-24 DIAGNOSIS — Z85828 Personal history of other malignant neoplasm of skin: Secondary | ICD-10-CM | POA: Diagnosis not present

## 2019-10-24 DIAGNOSIS — I129 Hypertensive chronic kidney disease with stage 1 through stage 4 chronic kidney disease, or unspecified chronic kidney disease: Secondary | ICD-10-CM | POA: Diagnosis present

## 2019-10-24 DIAGNOSIS — N179 Acute kidney failure, unspecified: Secondary | ICD-10-CM | POA: Diagnosis present

## 2019-10-24 DIAGNOSIS — E1122 Type 2 diabetes mellitus with diabetic chronic kidney disease: Secondary | ICD-10-CM | POA: Diagnosis present

## 2019-10-24 DIAGNOSIS — N10 Acute tubulo-interstitial nephritis: Secondary | ICD-10-CM

## 2019-10-24 DIAGNOSIS — Z880 Allergy status to penicillin: Secondary | ICD-10-CM | POA: Diagnosis not present

## 2019-10-24 DIAGNOSIS — N136 Pyonephrosis: Secondary | ICD-10-CM | POA: Diagnosis present

## 2019-10-24 DIAGNOSIS — Z87891 Personal history of nicotine dependence: Secondary | ICD-10-CM | POA: Diagnosis not present

## 2019-10-24 DIAGNOSIS — Z882 Allergy status to sulfonamides status: Secondary | ICD-10-CM | POA: Diagnosis not present

## 2019-10-24 DIAGNOSIS — E785 Hyperlipidemia, unspecified: Secondary | ICD-10-CM | POA: Diagnosis present

## 2019-10-24 DIAGNOSIS — D509 Iron deficiency anemia, unspecified: Secondary | ICD-10-CM | POA: Diagnosis present

## 2019-10-24 DIAGNOSIS — Z20822 Contact with and (suspected) exposure to covid-19: Secondary | ICD-10-CM | POA: Diagnosis present

## 2019-10-24 DIAGNOSIS — I714 Abdominal aortic aneurysm, without rupture: Secondary | ICD-10-CM | POA: Diagnosis present

## 2019-10-24 DIAGNOSIS — Z79899 Other long term (current) drug therapy: Secondary | ICD-10-CM | POA: Diagnosis not present

## 2019-10-24 DIAGNOSIS — B961 Klebsiella pneumoniae [K. pneumoniae] as the cause of diseases classified elsewhere: Secondary | ICD-10-CM | POA: Diagnosis present

## 2019-10-24 DIAGNOSIS — E875 Hyperkalemia: Secondary | ICD-10-CM | POA: Diagnosis present

## 2019-10-24 DIAGNOSIS — E872 Acidosis: Secondary | ICD-10-CM | POA: Diagnosis present

## 2019-10-24 LAB — BASIC METABOLIC PANEL
Anion gap: 8 (ref 5–15)
Anion gap: 8 (ref 5–15)
Anion gap: 9 (ref 5–15)
BUN: 114 mg/dL — ABNORMAL HIGH (ref 8–23)
BUN: 110 mg/dL — ABNORMAL HIGH (ref 8–23)
BUN: 99 mg/dL — ABNORMAL HIGH (ref 8–23)
CO2: 13 mmol/L — ABNORMAL LOW (ref 22–32)
CO2: 13 mmol/L — ABNORMAL LOW (ref 22–32)
CO2: 16 mmol/L — ABNORMAL LOW (ref 22–32)
Calcium: 9 mg/dL (ref 8.9–10.3)
Calcium: 9.1 mg/dL (ref 8.9–10.3)
Calcium: 8.9 mg/dL (ref 8.9–10.3)
Chloride: 113 mmol/L — ABNORMAL HIGH (ref 98–111)
Chloride: 115 mmol/L — ABNORMAL HIGH (ref 98–111)
Chloride: 114 mmol/L — ABNORMAL HIGH (ref 98–111)
Creatinine, Ser: 2.73 mg/dL — ABNORMAL HIGH (ref 0.44–1.00)
Creatinine, Ser: 2.22 mg/dL — ABNORMAL HIGH (ref 0.44–1.00)
Creatinine, Ser: 1.96 mg/dL — ABNORMAL HIGH (ref 0.44–1.00)
GFR calc Af Amer: 18 mL/min — ABNORMAL LOW (ref >60)
GFR calc Af Amer: 23 mL/min — ABNORMAL LOW (ref >60)
GFR calc Af Amer: 27 mL/min — ABNORMAL LOW (ref >60)
GFR calc non Af Amer: 15 mL/min — ABNORMAL LOW (ref >60)
GFR calc non Af Amer: 20 mL/min — ABNORMAL LOW (ref >60)
GFR calc non Af Amer: 23 mL/min — ABNORMAL LOW (ref >60)
Glucose, Bld: 77 mg/dL (ref 70–99)
Glucose, Bld: 56 mg/dL — ABNORMAL LOW (ref 70–99)
Glucose, Bld: 165 mg/dL — ABNORMAL HIGH (ref 70–99)
Potassium: 6.1 mmol/L — ABNORMAL HIGH (ref 3.5–5.1)
Potassium: 6.2 mmol/L — ABNORMAL HIGH (ref 3.5–5.1)
Potassium: 5 mmol/L (ref 3.5–5.1)
Sodium: 134 mmol/L — ABNORMAL LOW (ref 135–145)
Sodium: 136 mmol/L (ref 135–145)
Sodium: 139 mmol/L (ref 135–145)

## 2019-10-24 LAB — RESPIRATORY PANEL BY RT PCR (FLU A&B, COVID)
Influenza A by PCR: NEGATIVE
Influenza B by PCR: NEGATIVE
SARS Coronavirus 2 by RT PCR: NEGATIVE

## 2019-10-24 LAB — URINALYSIS, ROUTINE W REFLEX MICROSCOPIC
Bilirubin Urine: NEGATIVE
Glucose, UA: NEGATIVE mg/dL
Hgb urine dipstick: NEGATIVE
Ketones, ur: 5 mg/dL — AB
Nitrite: NEGATIVE
Protein, ur: 30 mg/dL — AB
Specific Gravity, Urine: 1.013 (ref 1.005–1.030)
WBC, UA: >50 WBC/hpf — ABNORMAL HIGH (ref 0–5)
pH: 5 (ref 5.0–8.0)

## 2019-10-24 LAB — CBC
HCT: 41.5 % (ref 36.0–46.0)
Hemoglobin: 13 g/dL (ref 12.0–15.0)
MCH: 30.4 pg (ref 26.0–34.0)
MCHC: 31.3 g/dL (ref 30.0–36.0)
MCV: 97 fL (ref 80.0–100.0)
Platelets: 252 10*3/uL (ref 150–400)
RBC: 4.28 MIL/uL (ref 3.87–5.11)
RDW: 13.5 % (ref 11.5–15.5)
WBC: 10.3 10*3/uL (ref 4.0–10.5)
nRBC: 0 % (ref 0.0–0.2)

## 2019-10-24 LAB — HEMOGLOBIN A1C
Hgb A1c MFr Bld: 6.2 % — ABNORMAL HIGH (ref 4.8–5.6)
Mean Plasma Glucose: 131.24 mg/dL

## 2019-10-24 LAB — MAGNESIUM: Magnesium: 1.8 mg/dL (ref 1.7–2.4)

## 2019-10-24 LAB — GLUCOSE, CAPILLARY
Glucose-Capillary: 54 mg/dL — ABNORMAL LOW (ref 70–99)
Glucose-Capillary: 111 mg/dL — ABNORMAL HIGH (ref 70–99)
Glucose-Capillary: 64 mg/dL — ABNORMAL LOW (ref 70–99)
Glucose-Capillary: 100 mg/dL — ABNORMAL HIGH (ref 70–99)
Glucose-Capillary: 107 mg/dL — ABNORMAL HIGH (ref 70–99)

## 2019-10-24 MED ORDER — ONDANSETRON HCL 4 MG/2ML IJ SOLN
4.0000 mg | Freq: Four times a day (QID) | INTRAMUSCULAR | Status: DC | PRN
  Administered 2019-10-25 – 2019-10-26 (×4): 4 mg via INTRAVENOUS
  Filled 2019-10-24 (×4): qty 2

## 2019-10-24 MED ORDER — SODIUM CHLORIDE 0.9 % IV SOLN
1.0000 g | INTRAVENOUS | Status: DC
  Administered 2019-10-24 – 2019-10-25 (×2): 1 g via INTRAVENOUS
  Filled 2019-10-24 (×3): qty 10

## 2019-10-24 MED ORDER — SODIUM POLYSTYRENE SULFONATE 15 GM/60ML PO SUSP
30.0000 g | Freq: Once | ORAL | Status: AC
  Administered 2019-10-24: 30 g via ORAL
  Filled 2019-10-24: qty 120

## 2019-10-24 MED ORDER — SODIUM ZIRCONIUM CYCLOSILICATE 5 G PO PACK
10.0000 g | PACK | ORAL | Status: AC
  Administered 2019-10-24: 05:00:00 10 g via ORAL
  Filled 2019-10-24: qty 2

## 2019-10-24 MED ORDER — MELOXICAM 7.5 MG PO TABS
7.5000 mg | ORAL_TABLET | Freq: Every day | ORAL | 0 refills | Status: DC
Start: 2019-10-24 — End: 2019-10-24

## 2019-10-24 MED ORDER — CARVEDILOL 3.125 MG PO TABS
6.2500 mg | ORAL_TABLET | Freq: Two times a day (BID) | ORAL | Status: DC
  Administered 2019-10-24 – 2019-10-27 (×3): 6.25 mg via ORAL
  Filled 2019-10-24 (×6): qty 2

## 2019-10-24 MED ORDER — SODIUM CHLORIDE 0.9 % IV SOLN: 1.0000 g | Freq: Once | INTRAVENOUS | Status: DC

## 2019-10-24 MED ORDER — CEFTRIAXONE SODIUM 1 G IJ SOLR
1.0000 g | Freq: Once | INTRAMUSCULAR | Status: AC
  Administered 2019-10-24: 01:00:00 1 g via INTRAMUSCULAR
  Filled 2019-10-24: qty 10

## 2019-10-24 MED ORDER — HEPARIN SODIUM (PORCINE) 5000 UNIT/ML IJ SOLN
5000.0000 [IU] | Freq: Three times a day (TID) | INTRAMUSCULAR | Status: DC
  Administered 2019-10-24 – 2019-10-26 (×7): 5000 [IU] via SUBCUTANEOUS
  Filled 2019-10-24 (×7): qty 1

## 2019-10-24 MED ORDER — ONDANSETRON HCL 4 MG PO TABS: 4.0000 mg | ORAL_TABLET | Freq: Four times a day (QID) | ORAL | Status: DC | PRN

## 2019-10-24 MED ORDER — STERILE WATER FOR INJECTION IV SOLN
INTRAVENOUS | Status: DC
  Filled 2019-10-24 (×6): qty 850

## 2019-10-24 MED ORDER — CALCIUM GLUCONATE-NACL 1-0.675 GM/50ML-% IV SOLN
1.0000 g | Freq: Once | INTRAVENOUS | Status: AC
  Administered 2019-10-24: 05:00:00 1000 mg via INTRAVENOUS
  Filled 2019-10-24: qty 50

## 2019-10-24 MED ORDER — INSULIN ASPART 100 UNIT/ML ~~LOC~~ SOLN: 0.0000 [IU] | Freq: Every day | SUBCUTANEOUS | Status: DC

## 2019-10-24 MED ORDER — NYSTATIN 100000 UNIT/GM EX POWD
Freq: Two times a day (BID) | CUTANEOUS | Status: DC
  Filled 2019-10-24 (×2): qty 15

## 2019-10-24 MED ORDER — ACETAMINOPHEN 325 MG PO TABS: 650.0000 mg | ORAL_TABLET | Freq: Four times a day (QID) | ORAL | Status: DC | PRN

## 2019-10-24 MED ORDER — ASCORBIC ACID 500 MG PO TABS
500.0000 mg | ORAL_TABLET | Freq: Every day | ORAL | Status: DC
  Administered 2019-10-24 – 2019-10-27 (×3): 500 mg via ORAL
  Filled 2019-10-24 (×4): qty 1

## 2019-10-24 MED ORDER — CILOSTAZOL 100 MG PO TABS
50.0000 mg | ORAL_TABLET | Freq: Two times a day (BID) | ORAL | Status: DC
  Administered 2019-10-24 – 2019-10-27 (×6): 50 mg via ORAL
  Filled 2019-10-24 (×7): qty 1

## 2019-10-24 MED ORDER — SODIUM BICARBONATE 8.4 % IV SOLN
50.0000 meq | Freq: Once | INTRAVENOUS | Status: AC
  Administered 2019-10-24: 16:00:00 50 meq via INTRAVENOUS
  Filled 2019-10-24: qty 50

## 2019-10-24 MED ORDER — DEXTROSE 50 % IV SOLN
INTRAVENOUS | Status: AC
  Filled 2019-10-24: qty 50

## 2019-10-24 MED ORDER — ACETAMINOPHEN 650 MG RE SUPP: 650.0000 mg | Freq: Four times a day (QID) | RECTAL | Status: DC | PRN

## 2019-10-24 MED ORDER — POLYSACCHARIDE IRON COMPLEX 150 MG PO CAPS
150.0000 mg | ORAL_CAPSULE | Freq: Two times a day (BID) | ORAL | Status: DC
  Administered 2019-10-24 – 2019-10-27 (×6): 150 mg via ORAL
  Filled 2019-10-24 (×7): qty 1

## 2019-10-24 MED ORDER — MELATONIN 3 MG PO TABS: 6.0000 mg | ORAL_TABLET | Freq: Every evening | ORAL | Status: DC | PRN

## 2019-10-24 MED ORDER — SODIUM CHLORIDE 0.9 % IV SOLN: INTRAVENOUS | Status: DC

## 2019-10-24 MED ORDER — PANTOPRAZOLE SODIUM 40 MG PO TBEC
40.0000 mg | DELAYED_RELEASE_TABLET | Freq: Every day | ORAL | Status: DC
  Administered 2019-10-24 – 2019-10-27 (×3): 40 mg via ORAL
  Filled 2019-10-24 (×4): qty 1

## 2019-10-24 MED ORDER — CHLORHEXIDINE GLUCONATE CLOTH 2 % EX PADS
6.0000 | MEDICATED_PAD | Freq: Every day | CUTANEOUS | Status: DC
  Administered 2019-10-24 – 2019-10-27 (×4): 6 via TOPICAL

## 2019-10-24 MED ORDER — CEPHALEXIN 500 MG PO CAPS
500.0000 mg | ORAL_CAPSULE | Freq: Two times a day (BID) | ORAL | 0 refills | Status: DC
Start: 2019-10-24 — End: 2019-10-24

## 2019-10-24 MED ORDER — SODIUM CHLORIDE 0.9 % IV BOLUS
500.0000 mL | Freq: Once | INTRAVENOUS | Status: AC
  Administered 2019-10-24: 05:00:00 500 mL via INTRAVENOUS

## 2019-10-24 MED ORDER — PRAVASTATIN SODIUM 40 MG PO TABS
80.0000 mg | ORAL_TABLET | Freq: Every morning | ORAL | Status: DC
  Administered 2019-10-24 – 2019-10-27 (×3): 80 mg via ORAL
  Filled 2019-10-24 (×4): qty 2

## 2019-10-24 MED ORDER — LIDOCAINE HCL (PF) 2 % IJ SOLN
INTRAMUSCULAR | Status: AC
  Administered 2019-10-24: 02:00:00 2.1 mL
  Filled 2019-10-24: qty 10

## 2019-10-24 MED ORDER — INSULIN ASPART 100 UNIT/ML ~~LOC~~ SOLN
0.0000 [IU] | Freq: Three times a day (TID) | SUBCUTANEOUS | Status: DC
  Administered 2019-10-26: 09:00:00 1 [IU] via SUBCUTANEOUS
  Administered 2019-10-26 (×2): 2 [IU] via SUBCUTANEOUS
  Administered 2019-10-27: 12:00:00 1 [IU] via SUBCUTANEOUS

## 2019-10-24 NOTE — H&P (Signed)
History and Physical  Ariana Shea [id number] DOB: 04-15-1934 DOA: 10/23/2019   PCP: Nickola Major, MD   Patient coming from: Home  Chief Complaint: back pain  HPI:  Ariana Shea is a 86 y.o. female with medical history of hypertension, diabetes mellitus, peripheral vascular disease, abdominal aortic aneurysm, hyperlipidemia presenting with bilateral lower back pain, right greater than left side that began on 10/22/2019 after taking out the garbage.  The patient had subjective fevers and chills for the last 2 days.  She denies any vomiting or diarrhea but has had some nausea.  She denies any abdominal pain, dysuria, hematuria, chest pain, shortness breath, hematochezia, melena, hematuria.  She has been taking some intermittent ibuprofen for her back pain.  She states that the pain is worse with any type of movement. In the emergency department, CT of the abdomen and pelvis showed infrarenal abdominal aortic aneurysm measuring 3.7 cm.  There was no periaortic stranding to suggest rupture.  She had chronically dilated right extrarenal pelvis likely secondary to UPJ obstruction.  There was bilateral urothelial thickening of the proximal ureters.  She was afebrile and hemodynamically stable with oxygen saturation 100%.  WBC 10.3, hemoglobin 13.0, platelets 252,000.  Serum creatinine 2.73 with potassium 6.1.  The patient was treated with calcium gluconate and Lokelma.  EKG shows sinus rhythm with nonspecific T wave changes.  UA showed > 50 WBC.  Assessment/Plan: Pyelonephritis -Likely contributing to the patient's back pain -Continue ceftriaxone -Follow urine culture -CT abd/pelvis as discussed above  Acute on chronic renal failure--CKD stage IIIb -Baseline creatinine 1.1-1.3 -Secondary to hemodynamic changes as well as from the patient's ibuprofen -Renal ultrasound -Continue IV fluids -Holding lisinopril  Hyperkalemia -Lokelma given in ED -repeat BMP -IVF -d/c  lisinopril -telemetry  Diabetes mellitus type 2 -Holding Lantus -NovoLog sliding scale -Check hemoglobin A1c  Essential hypertension -Continue carvedilol at lower dose  Peripheral vascular disease -The patient has chronic right leg pain over 1 year -Continue Pletal  Hyperlipidemia -Continue statin       Past Medical History:  Diagnosis Date  . Abdominal aortic aneurysm ([place]) 06/30/2014   Recommend follow-up ultrasound in 2018.  Marland Kitchen Anemia   . Arthritis    HANDS  . BCC (basal cell carcinoma of skin) 12/16/2015   Right Ant. Shoulder   . Blood transfusion without reported diagnosis   . Diabetes mellitus   . Generalized and unspecified atherosclerosis   . GERD (gastroesophageal reflux disease)   . Hydronephrosis of right kidney 06/26/2014   Chronic UPJ stenosis  . Hypertension   . Hypomagnesemia 06/23/2014   And hypocalcemia, hypophosphatemia, and hypokalemia.  . Nodular basal cell carcinoma (BCC) 06/27/2017   Behind Left Ear  . Peripheral arterial disease ([place])    history of known right common iliac artery occlusion status post left iliac stenting.  . Pneumonia    hx  . Pure hypercholesterolemia   . Superficial nodular basal cell carcinoma (BCC) 12/16/2015   Left Ant. Neck  . Superficial nodular basal cell carcinoma (BCC) 06/27/2017   Behing Right Ear  . Swallowing difficulty    gets crumbs caught in throat-told may have to have esophagus streched  . Ulcer   . UTI (lower urinary tract infection)    recent tx    Past Surgical History:  Procedure Laterality Date  . ABDOMINAL HYSTERECTOMY    . ARTERY REPAIR Bilateral    corotid artery surgery  . BLADDER SURGERY     tuck  .  CAROTID ENDARTERECTOMY     bilateral CEA 2008; (L) 11/2006, (R) 01/2007 with reperfusion phenomenon (seizure) post-operaitvely  . CHOLECYSTECTOMY    . MULTIPLE EXTRACTIONS WITH ALVEOLOPLASTY N/A 12/07/2013   Procedure: MULTIPLE EXTRACTION WITH ALVEOLOPLASTY;  Surgeon: Gae Bon, DDS;   Location: [facility];  Service: Oral Surgery;  Laterality: N/A;   Social History:  reports that she quit smoking about 10 years ago. Her smoking use included cigarettes. She has a 56.00 pack-year smoking history. She has never used smokeless tobacco. She reports that she does not drink alcohol or use drugs.   Family History  Problem Relation Age of Onset  . Heart disease Father      Allergies  Allergen Reactions  . Red Blood Cells Other (See Comments)    Patient is a Jehovah's Witness  . Penicillins Rash  . Sulfonamide Derivatives Rash     Prior to Admission medications   Medication Sig Start Date End Date Taking? Authorizing Provider  carvedilol (COREG) 25 MG tablet Take 1 tablet (25 mg total) by mouth 2 (two) times daily with a meal. 12/11/14   Lorretta Harp, MD  cilostazol (PLETAL) 50 MG tablet Take 1 tablet (50 mg total) by mouth 2 (two) times daily. 10/19/19   Lorretta Harp, MD  HYDROcodone-acetaminophen (NORCO/VICODIN) 5-325 MG tablet Take 1 tablet by mouth every 4 (four) hours as needed. 03/17/18   Isla Pence, MD  insulin glargine (LANTUS) 100 UNIT/ML injection Inject 25 Units into the skin 2 (two) times daily.    [provider]  iron polysaccharides (FERREX 150) 150 MG capsule Take 150 mg by mouth 2 (two) times daily.     [provider]  lisinopril (PRINIVIL,ZESTRIL) 20 MG tablet Take 1 tablet by mouth 2 (two) times daily. 09/28/17   [provider]  magnesium oxide (MAG-OX) 400 MG tablet Take 1 tablet (400 mg total) by mouth 2 (two) times daily. 06/30/14   Rexene Alberts, MD  omeprazole (PRILOSEC) 20 MG capsule Take 1 capsule (20 mg total) by mouth every morning. 12/11/14   Lorretta Harp, MD  pravastatin (PRAVACHOL) 80 MG tablet Take 1 tablet (80 mg total) by mouth every morning. 02/25/15   Lorretta Harp, MD  vitamin C (ASCORBIC ACID) 500 MG tablet Take 500 mg by mouth daily.    [provider]    Review of Systems:   Constitutional:  No weight loss, night sweats, Fevers, chills, fatigue.  Head&Eyes: No headache.  No vision loss.  No eye pain or scotoma ENT:  No Difficulty swallowing,Tooth/dental problems,Sore throat,  No ear ache, post nasal drip,  Cardio-vascular:  No chest pain, Orthopnea, PND, swelling in lower extremities,  dizziness, palpitations  GI:  No  abdominal pain, nausea, vomiting, diarrhea, loss of appetite, hematochezia, melena, heartburn, indigestion, Resp:  No shortness of breath with exertion or at rest. No cough. No coughing up of blood .No wheezing.No chest wall deformity  Skin:  no rash or lesions.  GU:  no dysuria, change in color of urine, no urgency or frequency. No flank pain.  Musculoskeletal:  Complains of back pain Psych:  No change in mood or affect. No depression or anxiety. Neurologic: No headache, no dysesthesia, no focal weakness, no vision loss. No syncope  Physical Exam: Vitals:   10/23/19 2150 10/24/19 0412 10/24/19 0600 10/24/19 0639  BP:  (!) 126/46 (!) 122/46 133/65  Pulse:  86 89 97  Resp:  15 19 20   Temp:    97.6 F (  36.4 C)  TempSrc:    Oral  SpO2:  96% 97% 100%  Weight: 59 kg   58 kg  Height: 5\' 3"  (1.6 m)   5\' 3"  (1.6 m)   General:  A&O x 3, NAD, nontoxic, pleasant/cooperative Head/Eye: No conjunctival hemorrhage, no icterus, [place]/AT, No nystagmus ENT:  No icterus,  No thrush, good dentition, no pharyngeal exudate Neck:  No masses, no lymphadenpathy, no bruits CV:  RRR, no rub, no gallop, no S3 Lung:  Bibasilar crackles. No wheeze Abdomen: soft/NT, +BS, nondistended, no peritoneal signs;  Bilateral CVA tender Ext: No cyanosis, No rashes, No petechiae, No lymphangitis, No edema Neuro: CNII-XII intact, strength 4/5 in bilateral upper and lower extremities, no dysmetria  Labs on Admission:  Basic Metabolic Panel: Recent Labs  Lab 10/24/19 0138  NA 134*  K 6.1*  CL 113*  CO2 13*  GLUCOSE 77  BUN 114*  CREATININE 2.73*  CALCIUM 9.0    Liver Function Tests: No results for input(s): AST, ALT, ALKPHOS, BILITOT, PROT, ALBUMIN in the last 168 hours. No results for input(s): LIPASE, AMYLASE in the last 168 hours. No results for input(s): AMMONIA in the last 168 hours. CBC: Recent Labs  Lab 10/24/19 0138  WBC 10.3  HGB 13.0  HCT 41.5  MCV 97.0  PLT 252   Coagulation Profile: No results for input(s): INR, PROTIME in the last 168 hours. Cardiac Enzymes: No results for input(s): CKTOTAL, CKMB, CKMBINDEX, TROPONINI in the last 168 hours. BNP: Invalid input(s): POCBNP CBG: No results for input(s): GLUCAP in the last 168 hours. Urine analysis:    Component Value Date/Time   COLORURINE YELLOW 10/24/2019 0015   APPEARANCEUR CLOUDY (A) 10/24/2019 0015   LABSPEC 1.013 10/24/2019 0015   PHURINE 5.0 10/24/2019 0015   GLUCOSEU NEGATIVE 10/24/2019 0015   HGBUR NEGATIVE 10/24/2019 0015   BILIRUBINUR NEGATIVE 10/24/2019 0015   KETONESUR 5 (A) 10/24/2019 0015   PROTEINUR 30 (A) 10/24/2019 0015   UROBILINOGEN 0.2 06/22/2014 1305   NITRITE NEGATIVE 10/24/2019 0015   LEUKOCYTESUR LARGE (A) 10/24/2019 0015   Sepsis Labs: @LABRCNTIP (procalcitonin:4,lacticidven:4) ) Recent Results (from the past 240 hour(s))  Respiratory Panel by RT PCR (Flu A&B, Covid) - Nasopharyngeal Swab     Status: None   Collection Time: 10/24/19  3:58 AM   Specimen: Nasopharyngeal Swab  Result Value Ref Range Status   SARS Coronavirus 2 by RT PCR NEGATIVE NEGATIVE Final    Comment: (NOTE) SARS-CoV-2 target nucleic acids are NOT DETECTED. The SARS-CoV-2 RNA is generally detectable in upper respiratoy specimens during the acute phase of infection. The lowest concentration of SARS-CoV-2 viral copies this assay can detect is 131 copies/mL. A negative result does not preclude SARS-Cov-2 infection and should not be used as the sole basis for treatment or other patient management decisions. A negative result may occur with  improper specimen  collection/handling, submission of specimen other than nasopharyngeal swab, presence of viral mutation(s) within the areas targeted by this assay, and inadequate number of viral copies (<131 copies/mL). A negative result must be combined with clinical observations, patient history, and epidemiological information. The expected result is Negative. Fact Sheet for Patients:  PinkCheek.be Fact Sheet for Healthcare Providers:  GravelBags.it This test is not yet ap proved or cleared by the [place] FDA and  has been authorized for detection and/or diagnosis of SARS-CoV-2 by FDA under an Emergency Use Authorization (EUA). This EUA will remain  in effect (meaning this test can be used) for the duration of  the COVID-19 declaration under Section 564(b)(1) of the Act, 21 U.S.C. section 360bbb-3(b)(1), unless the authorization is terminated or revoked sooner.    Influenza A by PCR NEGATIVE NEGATIVE Final   Influenza B by PCR NEGATIVE NEGATIVE Final    Comment: (NOTE) The Xpert Xpress SARS-CoV-2/FLU/RSV assay is intended as an aid in  the diagnosis of influenza from Nasopharyngeal swab specimens and  should not be used as a sole basis for treatment. Nasal washings and  aspirates are unacceptable for Xpert Xpress SARS-CoV-2/FLU/RSV  testing. Fact Sheet for Patients: PinkCheek.be Fact Sheet for Healthcare Providers: GravelBags.it This test is not yet approved or cleared by the [place] FDA and  has been authorized for detection and/or diagnosis of SARS-CoV-2 by  FDA under an Emergency Use Authorization (EUA). This EUA will remain  in effect (meaning this test can be used) for the duration of the  Covid-19 declaration under Section 564(b)(1) of the Act, 21  U.S.C. section 360bbb-3(b)(1), unless the authorization is  terminated or revoked. Performed at [facility],  [address], [place], [place] [address]      Radiological Exams on Admission: CT ABDOMEN PELVIS WO CONTRAST  Result Date: 10/24/2019 CLINICAL DATA:  AAA on x-ray EXAM: CT ABDOMEN AND PELVIS WITHOUT CONTRAST TECHNIQUE: Multidetector CT imaging of the abdomen and pelvis was performed following the standard protocol without IV contrast. COMPARISON:  Same-day lumbar radiograph, CT renal colic [id number] FINDINGS: Lower chest: Bandlike opacities in the lung bases compatible with atelectasis and/or scarring Normal heart size. No pericardial effusion. Calcifications of the coronary arteries and mitral annulus. Hepatobiliary: No focal liver abnormality is seen. Patient is post cholecystectomy. Slight prominence of the biliary tree likely related to reservoir effect. No calcified intraductal gallstones. Pancreas: Unremarkable. No pancreatic ductal dilatation or surrounding inflammatory changes. Spleen: Stable calcified lesion in the spleen, almost certainly benign. Could reflect a partially calcified granuloma versus hemangioma or lymphangioma given a slightly hypoattenuating appearance on comparison imaging. Otherwise normal spleen. Adrenals/Urinary Tract: Normal adrenal glands. Mild bilateral perinephric stranding, left slightly greater than right. Chronically dilated right extrarenal pelvis likely secondary to UPJ obstruction. Bilateral urothelial thickening is noted of the proximal ureters and collecting systems. There are bilateral punctate calcifications likely reflecting either vascular calcium rich tiny collecting system calculi without visible obstructive urolithiasis at this time. No worrisome renal mass. Urinary bladder appears grossly unremarkable without significant wall thickening or stranding. Stomach/Bowel: Distal esophagus, stomach and duodenal sweep are unremarkable. No small bowel wall thickening or dilatation. No evidence of obstruction. Numerous fecaliths are present within the otherwise normal appendix  in the right lower quadrant. There is pancolonic diverticulosis. No convincing evidence of acute diverticulitis. Pelvic floor laxity with possible rectocele. Vascular/Lymphatic: There is extensive severe atherosclerotic calcification throughout the aorta and branch vessels. There is mild lobular fusiform dilatation at the level the celiac access and renal artery origins measuring up to 2.8 cm. Larger bilobed infrarenal abdominal aortic aneurysm measures up to 3.7 cm in size. Increased diameter on the same day lumbar radiographs may be spuriously increased with radiographic technique. Some hypodense mural thrombus is noted with partial calcification. No periaortic stranding or acute findings to suggest threatened rupture. Left iliac artery stenting is noted. Extensive calcification of the inflow and outflow vessels is seen. Reproductive: Patient is post hysterectomy. Radiopaque pessary device noted within the vaginal canal. Other: No abdominopelvic free fluid or air. No bowel containing hernias. Pelvic floor laxity as above. Musculoskeletal: The osseous structures appear diffusely demineralized which may limit detection  of small or nondisplaced fractures. No acute osseous abnormality or suspicious osseous lesion. Levocurvature of the lumbar spine is similar to comparison is. Multilevel degenerative changes are present in the imaged portions of the spine. IMPRESSION: 1. Bilobed infrarenal abdominal aortic aneurysm measures up to 3.7 cm in size. Increased diameter on the same day lumbar radiographs may be spuriously increased with radiographic technique. No periaortic stranding or acute findings to suggest threatened rupture. Pararenal fusiform dilatation is stable as well. 2. Left iliac artery stenting. Luminal evaluation of the vasculature cannot be assessed in the absence of contrast media. 3. Chronically dilated right extrarenal pelvis likely secondary to UPJ obstruction. Bilateral urothelial thickening is noted of  the proximal ureters and collecting systems, nonspecific but can be seen with a ascending urinary tract infection. Correlate with urinalysis. 4. Bilateral punctate calcifications likely reflecting either vascular calcium rich tiny collecting system calculi without visible obstructive urolithiasis at this time. 5. Pancolonic diverticulosis without convincing evidence of acute diverticulitis. 6. Pelvic floor laxity with possible rectocele. Correlate with symptoms. 7. Aortic Atherosclerosis (ICD10-I70.0). Electronically Signed   By: Lovena Le M.D.   On: 10/24/2019 04:04   DG Lumbar Spine Complete  Result Date: 10/24/2019 CLINICAL DATA:  Low back pain, difficulty with ambulation, no known injury EXAM: LUMBAR SPINE - COMPLETE 4+ VIEW COMPARISON:  CT 06/25/2014 FINDINGS: The osseous structures appear diffusely demineralized which may limit detection of small or nondisplaced fractures. Five lumbar type vertebral bodies are visualized. There is levocurvature of the lumbar spine with an apex at the L3 level. Mild stepwise retrolisthesis L2-L4 with grade 1 anterolisthesis L4 on L5. Findings on a likely degenerative basis given advanced facet arthropathic changes throughout the lumbar spine and multilevel disc height loss with vacuum disc and discogenic endplate changes. 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 Additional degenerative changes of the hips and SI joints. Extensive aortoiliac atherosclerosis with a bilobed abdominal aortic aneurysm measuring up to 4.7 cm in diameter, increased from 3.2 cm on comparison CT from 2016. Additional vascular calcium throughout the abdomen and pelvis. Few phleboliths in the pelvis as well. Normal bowel gas pattern. IMPRESSION: 1. Osseous demineralization, curvature, and advanced degenerative changes may limit detection of subtle fracture. No definite acute osseous injury. 2. Mild stepwise retrolisthesis L2-L4 with grade 1 anterolisthesis L4 on L5. 3.  Moderate discogenic and facet degenerative changes throughout the lumbar spine. 4. Enlarging bilobed abdominal aortic aneurysm measuring up to 4.7 cm at this time. Angiographic imaging may be of clinical utility in the setting of atraumatic acute onset low back pain. Critical Value/emergent results were called by telephone at the time of interpretation on 10/24/2019 at 1:37 am to provider [facility] , who verbally acknowledged these results. Electronically Signed   By: Lovena Le M.D.   On: 10/24/2019 01:35    EKG: Independently reviewed. Sinus, nonspecific T wave changes    Time spent:60 minutes Code Status:   FULL Family Communication:  No Family at bedside Disposition Plan: expect 2-3 day hospitalization Consults called: none DVT Prophylaxis: [place] Heparin     Orson Eva, DO  [facility] Pager [phone]  If 7PM-7AM, please contact night-coverage [email or web address] Password [facility] 10/24/2019, 7:41 AM

## 2019-10-24 NOTE — Progress Notes (Signed)
Hypoglycemic Event  CBG: 54  Treatment: 4oz coca-cola  Symptoms: none  Follow-up CBG: Time: 1141 CBG Result: 64  Treatment: orange crackers x4  Follow-up CBG: Time: 1141 CBG Result: 111  Possible Reasons for Event: patient reports "not eating much lately"  Comments/MD notified: Dr. Carles Collet made aware    Marin Shutter

## 2019-10-24 NOTE — Consult Note (Addendum)
[facility] Renal Consultation Note  Requesting FA:[id number] Tat, MD  Indication for Consultation:  AKI   Chief complaint: Presented with lower back pain  HPI:  Ariana Shea is a 86 y.o. female with a history of hypertension, diabetes, peripheral vascular disease, abdominal aortic aneurysm who came into the hospital with lower back pain.  She has found it difficult to urinate.  Denies n/v and states has been eating and drinking ok.  She had a CT abdomen pelvis which demonstrated infrarenal abdominal aortic aneurysm at 3.7 cm without concern for threatened rupture.  Imaging also was notable for left iliac artery stenting, chronically dilated right extra renal pelvis likely secondary to UPJ obstruction and bilateral urothelial thickening.  There is not a recent baseline available in the [facility] system however creatinine was 1.0 in 2016 as below.  She has had episodes of AKI with a peak creatinine of 8.9 in 2016.  She has been on lisinopril 20 mg daily at home.  Cr 2.73 and BUN 114 with K 6.1 and bicarb 13.  She was given lokelma and calcium and was started on normal saline.  Nothing running on my exam. Repeat electrolytes pending.   Creatinine, Ser  Date/Time Value Ref Range Status  10/24/2019 01:38 AM 2.73 (H) 0.44 - 1.00 mg/dL Final  06/30/2014 06:07 AM 1.06 0.50 - 1.10 mg/dL Final  06/29/2014 06:03 AM 0.98 0.50 - 1.10 mg/dL Final  06/28/2014 06:05 AM 0.92 0.50 - 1.10 mg/dL Final  06/27/2014 06:02 AM 1.15 (H) 0.50 - 1.10 mg/dL Final  06/26/2014 06:02 AM 1.50 (H) 0.50 - 1.10 mg/dL Final  06/25/2014 06:23 AM 2.05 (H) 0.50 - 1.10 mg/dL Final    Comment:    DELTA CHECK NOTED  06/24/2014 06:23 AM 3.34 (H) 0.50 - 1.10 mg/dL Final    Comment:    DELTA CHECK NOTED  06/23/2014 06:03 AM 6.64 (H) 0.50 - 1.10 mg/dL Final  06/22/2014 02:11 PM 8.54 (H) 0.50 - 1.10 mg/dL Final  06/22/2014 12:15 PM 8.91 (H) 0.50 - 1.10 mg/dL Final  11/28/2013 01:13 PM 1.18 (H) 0.50 - 1.10 mg/dL Final   07/23/2010 03:40 AM 0.85 0.4 - 1.2 mg/dL Final  07/22/2010 03:20 AM 0.80 0.4 - 1.2 mg/dL Final  07/21/2010 05:50 AM 0.88 0.4 - 1.2 mg/dL Final  07/20/2010 08:28 PM 0.88 0.4 - 1.2 mg/dL Final  04/02/2007 03:15 AM 0.58  Final  04/01/2007 03:25 AM 0.55  Final  03/31/2007 03:38 AM 0.65  Final  03/30/2007 05:28 PM 0.85  Final  03/28/2007 10:53 AM 0.85  Final  02/17/2007 03:38 AM 0.67  Final  02/10/2007 04:49 PM 0.87  Final  11/26/2006 05:30 AM 0.69  Final  11/25/2006 05:00 AM 0.68  Final     PMHx:   Past Medical History:  Diagnosis Date  . Abdominal aortic aneurysm ([place]) 06/30/2014   Recommend follow-up ultrasound in 2018.  Marland Kitchen Anemia   . Arthritis    HANDS  . BCC (basal cell carcinoma of skin) 12/16/2015   Right Ant. Shoulder   . Blood transfusion without reported diagnosis   . Diabetes mellitus   . Generalized and unspecified atherosclerosis   . GERD (gastroesophageal reflux disease)   . Hydronephrosis of right kidney 06/26/2014   Chronic UPJ stenosis  . Hypertension   . Hypomagnesemia 06/23/2014   And hypocalcemia, hypophosphatemia, and hypokalemia.  . Nodular basal cell carcinoma (BCC) 06/27/2017   Behind Left Ear  . Peripheral arterial disease ([place])    history of known right  common iliac artery occlusion status post left iliac stenting.  . Pneumonia    hx  . Pure hypercholesterolemia   . Superficial nodular basal cell carcinoma (BCC) 12/16/2015   Left Ant. Neck  . Superficial nodular basal cell carcinoma (BCC) 06/27/2017   Behing Right Ear  . Swallowing difficulty    gets crumbs caught in throat-told may have to have esophagus streched  . Ulcer   . UTI (lower urinary tract infection)    recent tx     Past Surgical History:  Procedure Laterality Date  . ABDOMINAL HYSTERECTOMY    . ARTERY REPAIR Bilateral    corotid artery surgery  . BLADDER SURGERY     tuck  . CAROTID ENDARTERECTOMY     bilateral CEA 2008; (L) 11/2006, (R) 01/2007 with reperfusion phenomenon  (seizure) post-operaitvely  . CHOLECYSTECTOMY    . MULTIPLE EXTRACTIONS WITH ALVEOLOPLASTY N/A 12/07/2013   Procedure: MULTIPLE EXTRACTION WITH ALVEOLOPLASTY;  Surgeon: Gae Bon, DDS;  Location: [place];  Service: Oral Surgery;  Laterality: N/A;    Family Hx:  Father HTN  Mother MD Family History  Problem Relation Age of Onset  . Heart disease Father     Social History:  reports that she quit smoking about 10 years ago. Her smoking use included cigarettes. She has a 56.00 pack-year smoking history. She has never used smokeless tobacco. She reports that she does not drink alcohol or use drugs.  Allergies:  Allergies  Allergen Reactions  . Red Blood Cells Other (See Comments)    Patient is a Jehovah's Witness  . Penicillins Rash  . Sulfonamide Derivatives Rash    Medications: Prior to Admission medications   Medication Sig Start Date End Date Taking? Authorizing Provider  carvedilol (COREG) 25 MG tablet Take 1 tablet (25 mg total) by mouth 2 (two) times daily with a meal. 12/11/14   Lorretta Harp, MD  cilostazol (PLETAL) 50 MG tablet Take 1 tablet (50 mg total) by mouth 2 (two) times daily. 10/19/19   Lorretta Harp, MD  HYDROcodone-acetaminophen (NORCO/VICODIN) 5-325 MG tablet Take 1 tablet by mouth every 4 (four) hours as needed. 03/17/18   Isla Pence, MD  insulin glargine (LANTUS) 100 UNIT/ML injection Inject 25 Units into the skin 2 (two) times daily.    [provider]  iron polysaccharides (FERREX 150) 150 MG capsule Take 150 mg by mouth 2 (two) times daily.     [provider]  lisinopril (PRINIVIL,ZESTRIL) 20 MG tablet Take 1 tablet by mouth 2 (two) times daily. 09/28/17   [provider]  magnesium oxide (MAG-OX) 400 MG tablet Take 1 tablet (400 mg total) by mouth 2 (two) times daily. 06/30/14   Rexene Alberts, MD  omeprazole (PRILOSEC) 20 MG capsule Take 1 capsule (20 mg total) by mouth every morning. 12/11/14   Lorretta Harp, MD   pravastatin (PRAVACHOL) 80 MG tablet Take 1 tablet (80 mg total) by mouth every morning. 02/25/15   Lorretta Harp, MD  vitamin C (ASCORBIC ACID) 500 MG tablet Take 500 mg by mouth daily.    [provider]    I have reviewed the patient's current medications.  Labs:  BMP Latest Ref Rng & Units 10/24/2019 06/30/2014 06/29/2014  Glucose 70 - 99 mg/dL 77 96 192(H)  BUN 8 - 23 mg/dL 114(H) 18 17  Creatinine 0.44 - 1.00 mg/dL 2.73(H) 1.06 0.98  Sodium 135 - 145 mmol/L 134(L) 137 138  Potassium 3.5 - 5.1 mmol/L 6.1(H) 4.4  4.5  Chloride 98 - 111 mmol/L 113(H) 106 104  CO2 22 - 32 mmol/L 13(L) 24 26  Calcium 8.9 - 10.3 mg/dL 9.0 6.7(L) 5.7(LL)    Urinalysis    Component Value Date/Time   COLORURINE YELLOW 10/24/2019 0015   APPEARANCEUR CLOUDY (A) 10/24/2019 0015   LABSPEC 1.013 10/24/2019 0015   PHURINE 5.0 10/24/2019 0015   GLUCOSEU NEGATIVE 10/24/2019 0015   HGBUR NEGATIVE 10/24/2019 0015   BILIRUBINUR NEGATIVE 10/24/2019 0015   KETONESUR 5 (A) 10/24/2019 0015   PROTEINUR 30 (A) 10/24/2019 0015   UROBILINOGEN 0.2 06/22/2014 1305   NITRITE NEGATIVE 10/24/2019 0015   LEUKOCYTESUR LARGE (A) 10/24/2019 0015     ROS:    Pertinent items noted in HPI and remainder of comprehensive ROS otherwise negative.  Physical Exam: Vitals:   10/24/19 0600 10/24/19 0639  BP: (!) 122/46 133/65  Pulse: 89 97  Resp: 19 20  Temp:  97.6 F (36.4 C)  SpO2: 97% 100%     General: adult female in NAD  HEENT: NCAT Eyes: EOMI; sclera anicteric Neck: supple trachea midline Heart: S1S2 no rub Lungs: clear to auscultation and unlabored Abdomen: soft/NT on my exam/ ND Extremities: no edema appreciated; no cyanosis or clubbing  Skin: no rash on extremities exposed  Neuro: alert and oriented x 3; provides hx and follows commands  Assessment/Plan:  # AKI  - Chronic obstruction noted with decreased reserve as well as severe vascular disease.  Seems dry on exam.  UA 30 mg/dL protein and 0-5  RBC.  - Insert foley catheter  - Change normal saline to bicarb gtt  - Hold lisinopril - Note renal ultrasound ordered  # Obstructive uropathy  - Chronic but now with difficulty voiding  - Insert foley catheter for now   # Hyperkalemia - Setting of AKI and lisinopril - Hold lisinopril   - Change to renal diabetic diet  - bicarb gtt as above  # Metabolic acidosis  - change to bicarb gtt - repeat labs pending   # Pyelonephritis  - per primary team   # HTN  - Controlled  - hold lisinopril    Ariana Shea 10/24/2019, 10:24 AM  Addendum - labs back - 1 amp bicarb and kayexalate.  starting bicarb gtt.  Insert foley as above.  Repeat BMP 2pm  Ariana Shea 10:26 AM 10/24/2019

## 2019-10-25 LAB — BASIC METABOLIC PANEL
Anion gap: 11 (ref 5–15)
BUN: 76 mg/dL — ABNORMAL HIGH (ref 8–23)
CO2: 26 mmol/L (ref 22–32)
Calcium: 8.1 mg/dL — ABNORMAL LOW (ref 8.9–10.3)
Chloride: 106 mmol/L (ref 98–111)
Creatinine, Ser: 1.28 mg/dL — ABNORMAL HIGH (ref 0.44–1.00)
GFR calc Af Amer: 45 mL/min — ABNORMAL LOW (ref >60)
GFR calc non Af Amer: 39 mL/min — ABNORMAL LOW (ref >60)
Glucose, Bld: 90 mg/dL (ref 70–99)
Potassium: 3.5 mmol/L (ref 3.5–5.1)
Sodium: 143 mmol/L (ref 135–145)

## 2019-10-25 LAB — GLUCOSE, CAPILLARY
Glucose-Capillary: 72 mg/dL (ref 70–99)
Glucose-Capillary: 98 mg/dL (ref 70–99)
Glucose-Capillary: 112 mg/dL — ABNORMAL HIGH (ref 70–99)
Glucose-Capillary: 137 mg/dL — ABNORMAL HIGH (ref 70–99)

## 2019-10-25 LAB — CBC
HCT: 32.3 % — ABNORMAL LOW (ref 36.0–46.0)
Hemoglobin: 10.6 g/dL — ABNORMAL LOW (ref 12.0–15.0)
MCH: 30.3 pg (ref 26.0–34.0)
MCHC: 32.8 g/dL (ref 30.0–36.0)
MCV: 92.3 fL (ref 80.0–100.0)
Platelets: 233 10*3/uL (ref 150–400)
RBC: 3.5 MIL/uL — ABNORMAL LOW (ref 3.87–5.11)
RDW: 13.2 % (ref 11.5–15.5)
WBC: 7.2 10*3/uL (ref 4.0–10.5)
nRBC: 0 % (ref 0.0–0.2)

## 2019-10-25 MED ORDER — LOPERAMIDE HCL 2 MG PO CAPS: 2.0000 mg | ORAL_CAPSULE | ORAL | Status: DC | PRN

## 2019-10-25 MED ORDER — POTASSIUM CHLORIDE CRYS ER 20 MEQ PO TBCR
20.0000 meq | EXTENDED_RELEASE_TABLET | Freq: Once | ORAL | Status: DC
  Filled 2019-10-25: qty 1

## 2019-10-25 MED ORDER — SODIUM CHLORIDE 0.9 % IV SOLN: INTRAVENOUS | Status: DC

## 2019-10-25 NOTE — Progress Notes (Signed)
PROGRESS NOTE    Ariana Shea  [id number] DOB: 1934/02/21 DOA: 10/23/2019 PCP: Nickola Major, MD      Brief Narrative:  Ariana Shea is a 86 y.o. F with PVD, AAA in monitoring, DM, and HTN who presented with back pain for 2 days.  [place] was [place] few days prior to admission when she developed low back pain bilaterally, as well as muscle aches.    In the ER, she was noted to have acute kidney injury Cr 2.7 up from baseline 1.3, as well as hyperkalemia, and pyuria/bacteriuria.  She was given IV fluids and IV antibiotics in the hospital service were asked to evaluate.          Assessment & Plan:  Acute kidney injury on CKD IIIb Hyperkalemia Cr 2.7 on admission up from baseline 1.1-1.3.  Nephrology consulted, lisinopril held and bicarb fluids given and Cr now down to 1.2.   -Continue IV fluids 24 more hours then stop -Discussed foley with urology, okay to attempt voiding trial   Pyelonephritis Urine culture growing Klebsiella, sensitivities pending -Continue ceftriaxone, narrowed to oral antibiotics can sensitivities available  UPJ obstruction This appears chronic on radiography, but was incidetnal.  Discussed with urology, Dr. Noah Delaine by phone.  He recommends outpatient follow-up in 7 to 14 days.   -Follow up with Dr. Noah Delaine in 7-14 days  Possible bladder outlet obstruction Foley placed in ER.  No clear reason for bladder obstruction in this female, will attempt voiding trial. -Remove foley and voiding trial   Diabetes Glucose good -Continue SS corrections  Peripheral vascular disease, secondary prevention Hypertension BP normal -Continue carvedilol -Continue cilostazol, pravasttatin  Iron deficiency -Continue iron  GERD -Continue PPI       Disposition: Status is: Inpatient  Remains inpatient appropriate because: Nephrology recommend continued IV fluids, furthermore she has no safe discharge plan   Dispo: The patient is from:  Home              Anticipated d/c is to: SNF              Anticipated d/c date is: 1 day              Patient currently is medically stable to d/c.               MDM: The below labs and imaging reports were reviewed and summarized above.  Medication management as above.   DVT prophylaxis: Heparin Code Status: FULL Family Communication:     Consultants:   Nephrology  Procedures:   5/5 renal US  5/5 CT abdomen and pelvi  Antimicrobials:   Ceftriaxone 5/4 >>   Culture data:   Urine culture Klebsiella             Subjective: Patient feels tired, nauseous.  No fever.  No confusion, no vomiting, no diarrhea.  No chest pain.  Back pain is improved.  Objective: Vitals:   10/24/19 2005 10/24/19 2109 10/25/19 0507 10/25/19 1327  BP:  101/83 121/69 (!) 123/54  Pulse:  96 94 84  Resp:  19 19 16   Temp:  97.7 F (36.5 C) 98 F (36.7 C) (!) 97.5 F (36.4 C)  TempSrc:  Oral Oral Oral  SpO2: 98% 97% 92% 93%  Weight:      Height:        Intake/Output Summary (Last 24 hours) at 10/25/2019 1456 Last data filed at 10/25/2019 0335 Gross per 24 hour  Intake 1791.12 ml  Output  1950 ml  Net -158.88 ml   Filed Weights   10/23/19 2150 10/24/19 0639  Weight: 59 kg 58 kg    Examination: General appearance: Elderly adult female, alert and in no obvious distress.  Appears tired. HEENT: Anicteric, conjunctiva pink, lids and lashes normal. No nasal deformity, discharge, epistaxis.  Lips moist, edentulous, oropharynx moist, no oral lesions, hearing diminished.   Skin: Warm and dry.  No jaundice.  No suspicious rashes or lesions. Cardiac: RRR, nl S1-S2, no murmurs appreciated.  Capillary refill is brisk.  JVP normal.  No LE edema.  Radial pulses 2+ and symmetric. Respiratory: Normal respiratory rate and rhythm.  CTAB without rales or wheezes. Abdomen: Abdomen soft.  Mild nonfocal TTP without guarding. No ascites, distension, hepatosplenomegaly.   MSK: No  deformities or effusions.  Diffuse loss of subcutaneous muscle mass and fat Neuro: Awake and alert.  EOMI, moves all extremities with generalized but symmetric weakness. Speech fluent.    Psych: Sensorium intact and responding to questions, attention normal. Affect normal.  Judgment and insight appear normal.    Data Reviewed: I have personally reviewed following labs and imaging studies:  CBC: Recent Labs  Lab 10/24/19 0138 10/25/19 0512  WBC 10.3 7.2  HGB 13.0 10.6*  HCT 41.5 32.3*  MCV 97.0 92.3  PLT 252 0000000   Basic Metabolic Panel: Recent Labs  Lab 10/24/19 0138 10/24/19 0936 10/24/19 1323 10/25/19 0512  NA 134* 136 139 143  K 6.1* 6.2* 5.0 3.5  CL 113* 115* 114* 106  CO2 13* 13* 16* 26  GLUCOSE 77 56* 165* 90  BUN 114* 110* 99* 76*  CREATININE 2.73* 2.22* 1.96* 1.28*  CALCIUM 9.0 9.1 8.9 8.1*  MG  --  1.8  --   --    GFR: Estimated Creatinine Clearance: 27.5 mL/min (A) (by C-G formula based on SCr of 1.28 mg/dL (H)). Liver Function Tests: No results for input(s): AST, ALT, ALKPHOS, BILITOT, PROT, ALBUMIN in the last 168 hours. No results for input(s): LIPASE, AMYLASE in the last 168 hours. No results for input(s): AMMONIA in the last 168 hours. Coagulation Profile: No results for input(s): INR, PROTIME in the last 168 hours. Cardiac Enzymes: No results for input(s): CKTOTAL, CKMB, CKMBINDEX, TROPONINI in the last 168 hours. BNP (last 3 results) No results for input(s): PROBNP in the last 8760 hours. HbA1C: Recent Labs    10/24/19 0936  HGBA1C 6.2*   CBG: Recent Labs  Lab 10/24/19 1219 10/24/19 1618 10/24/19 2050 10/25/19 0758 10/25/19 1241  GLUCAP 111* 100* 107* 72 98   Lipid Profile: No results for input(s): CHOL, HDL, LDLCALC, TRIG, CHOLHDL, LDLDIRECT in the last 72 hours. Thyroid Function Tests: No results for input(s): TSH, T4TOTAL, FREET4, T3FREE, THYROIDAB in the last 72 hours. Anemia Panel: No results for input(s): VITAMINB12, FOLATE,  FERRITIN, TIBC, IRON, RETICCTPCT in the last 72 hours. Urine analysis:    Component Value Date/Time   COLORURINE YELLOW 10/24/2019 0015   APPEARANCEUR CLOUDY (A) 10/24/2019 0015   LABSPEC 1.013 10/24/2019 0015   PHURINE 5.0 10/24/2019 0015   GLUCOSEU NEGATIVE 10/24/2019 0015   HGBUR NEGATIVE 10/24/2019 0015   BILIRUBINUR NEGATIVE 10/24/2019 0015   KETONESUR 5 (A) 10/24/2019 0015   PROTEINUR 30 (A) 10/24/2019 0015   UROBILINOGEN 0.2 06/22/2014 1305   NITRITE NEGATIVE 10/24/2019 0015   LEUKOCYTESUR LARGE (A) 10/24/2019 0015   Sepsis Labs: @LABRCNTIP (procalcitonin:4,lacticacidven:4)  ) Recent Results (from the past 240 hour(s))  Urine Culture     Status: Abnormal (Preliminary  result)   Collection Time: 10/24/19  1:14 AM   Specimen: Urine, Clean Catch  Result Value Ref Range Status   Specimen Description   Final    URINE, CLEAN CATCH Performed at [facility], [address] [place], [place] [address]    Special Requests   Final    NONE Performed at [facility], [address], [place], [place] [address]    Culture >=100,000 COLONIES/mL KLEBSIELLA PNEUMONIAE (A)  Final   Report Status PENDING  Incomplete  Respiratory Panel by RT PCR (Flu A&B, Covid) - Nasopharyngeal Swab     Status: None   Collection Time: 10/24/19  3:58 AM   Specimen: Nasopharyngeal Swab  Result Value Ref Range Status   SARS Coronavirus 2 by RT PCR NEGATIVE NEGATIVE Final    Comment: (NOTE) SARS-CoV-2 target nucleic acids are NOT DETECTED. The SARS-CoV-2 RNA is generally detectable in upper respiratoy specimens during the acute phase of infection. The lowest concentration of SARS-CoV-2 viral copies this assay can detect is 131 copies/mL. A negative result does not preclude SARS-Cov-2 infection and should not be used as the sole basis for treatment or other patient management decisions. A negative result may occur with  improper specimen collection/handling, submission of specimen other than  nasopharyngeal swab, presence of viral mutation(s) within the areas targeted by this assay, and inadequate number of viral copies (<131 copies/mL). A negative result must be combined with clinical observations, patient history, and epidemiological information. The expected result is Negative. Fact Sheet for Patients:  PinkCheek.be Fact Sheet for Healthcare Providers:  GravelBags.it This test is not yet ap proved or cleared by the [place] FDA and  has been authorized for detection and/or diagnosis of SARS-CoV-2 by FDA under an Emergency Use Authorization (EUA). This EUA will remain  in effect (meaning this test can be used) for the duration of the COVID-19 declaration under Section 564(b)(1) of the Act, 21 U.S.C. section 360bbb-3(b)(1), unless the authorization is terminated or revoked sooner.    Influenza A by PCR NEGATIVE NEGATIVE Final   Influenza B by PCR NEGATIVE NEGATIVE Final    Comment: (NOTE) The Xpert Xpress SARS-CoV-2/FLU/RSV assay is intended as an aid in  the diagnosis of influenza from Nasopharyngeal swab specimens and  should not be used as a sole basis for treatment. Nasal washings and  aspirates are unacceptable for Xpert Xpress SARS-CoV-2/FLU/RSV  testing. Fact Sheet for Patients: PinkCheek.be Fact Sheet for Healthcare Providers: GravelBags.it This test is not yet approved or cleared by the [place] FDA and  has been authorized for detection and/or diagnosis of SARS-CoV-2 by  FDA under an Emergency Use Authorization (EUA). This EUA will remain  in effect (meaning this test can be used) for the duration of the  Covid-19 declaration under Section 564(b)(1) of the Act, 21  U.S.C. section 360bbb-3(b)(1), unless the authorization is  terminated or revoked. Performed at [facility], [address] [place], [place] [address]           Radiology Studies: CT ABDOMEN PELVIS WO CONTRAST  Result Date: 10/24/2019 CLINICAL DATA:  AAA on x-ray EXAM: CT ABDOMEN AND PELVIS WITHOUT CONTRAST TECHNIQUE: Multidetector CT imaging of the abdomen and pelvis was performed following the standard protocol without IV contrast. COMPARISON:  Same-day lumbar radiograph, CT renal colic [id number] FINDINGS: Lower chest: Bandlike opacities in the lung bases compatible with atelectasis and/or scarring Normal heart size. No pericardial effusion. Calcifications of the coronary arteries and mitral annulus. Hepatobiliary: No focal liver abnormality is seen.  Patient is post cholecystectomy. Slight prominence of the biliary tree likely related to reservoir effect. No calcified intraductal gallstones. Pancreas: Unremarkable. No pancreatic ductal dilatation or surrounding inflammatory changes. Spleen: Stable calcified lesion in the spleen, almost certainly benign. Could reflect a partially calcified granuloma versus hemangioma or lymphangioma given a slightly hypoattenuating appearance on comparison imaging. Otherwise normal spleen. Adrenals/Urinary Tract: Normal adrenal glands. Mild bilateral perinephric stranding, left slightly greater than right. Chronically dilated right extrarenal pelvis likely secondary to UPJ obstruction. Bilateral urothelial thickening is noted of the proximal ureters and collecting systems. There are bilateral punctate calcifications likely reflecting either vascular calcium rich tiny collecting system calculi without visible obstructive urolithiasis at this time. No worrisome renal mass. Urinary bladder appears grossly unremarkable without significant wall thickening or stranding. Stomach/Bowel: Distal esophagus, stomach and duodenal sweep are unremarkable. No small bowel wall thickening or dilatation. No evidence of obstruction. Numerous fecaliths are present within the otherwise normal appendix in the right lower quadrant. There is  pancolonic diverticulosis. No convincing evidence of acute diverticulitis. Pelvic floor laxity with possible rectocele. Vascular/Lymphatic: There is extensive severe atherosclerotic calcification throughout the aorta and branch vessels. There is mild lobular fusiform dilatation at the level the celiac access and renal artery origins measuring up to 2.8 cm. Larger bilobed infrarenal abdominal aortic aneurysm measures up to 3.7 cm in size. Increased diameter on the same day lumbar radiographs may be spuriously increased with radiographic technique. Some hypodense mural thrombus is noted with partial calcification. No periaortic stranding or acute findings to suggest threatened rupture. Left iliac artery stenting is noted. Extensive calcification of the inflow and outflow vessels is seen. Reproductive: Patient is post hysterectomy. Radiopaque pessary device noted within the vaginal canal. Other: No abdominopelvic free fluid or air. No bowel containing hernias. Pelvic floor laxity as above. Musculoskeletal: The osseous structures appear diffusely demineralized which may limit detection of small or nondisplaced fractures. No acute osseous abnormality or suspicious osseous lesion. Levocurvature of the lumbar spine is similar to comparison is. Multilevel degenerative changes are present in the imaged portions of the spine. IMPRESSION: 1. Bilobed infrarenal abdominal aortic aneurysm measures up to 3.7 cm in size. Increased diameter on the same day lumbar radiographs may be spuriously increased with radiographic technique. No periaortic stranding or acute findings to suggest threatened rupture. Pararenal fusiform dilatation is stable as well. 2. Left iliac artery stenting. Luminal evaluation of the vasculature cannot be assessed in the absence of contrast media. 3. Chronically dilated right extrarenal pelvis likely secondary to UPJ obstruction. Bilateral urothelial thickening is noted of the proximal ureters and collecting  systems, nonspecific but can be seen with a ascending urinary tract infection. Correlate with urinalysis. 4. Bilateral punctate calcifications likely reflecting either vascular calcium rich tiny collecting system calculi without visible obstructive urolithiasis at this time. 5. Pancolonic diverticulosis without convincing evidence of acute diverticulitis. 6. Pelvic floor laxity with possible rectocele. Correlate with symptoms. 7. Aortic Atherosclerosis (ICD10-I70.0). Electronically Signed   By: Lovena Le M.D.   On: 10/24/2019 04:04   DG Lumbar Spine Complete  Result Date: 10/24/2019 CLINICAL DATA:  Low back pain, difficulty with ambulation, no known injury EXAM: LUMBAR SPINE - COMPLETE 4+ VIEW COMPARISON:  CT 06/25/2014 FINDINGS: The osseous structures appear diffusely demineralized which may limit detection of small or nondisplaced fractures. Five lumbar type vertebral bodies are visualized. There is levocurvature of the lumbar spine with an apex at the L3 level. Mild stepwise retrolisthesis L2-L4 with grade 1 anterolisthesis L4 on L5. Findings on  a likely degenerative basis given advanced facet arthropathic changes throughout the lumbar spine and multilevel disc height loss with vacuum disc and discogenic endplate changes. 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 Additional degenerative changes of the hips and SI joints. Extensive aortoiliac atherosclerosis with a bilobed abdominal aortic aneurysm measuring up to 4.7 cm in diameter, increased from 3.2 cm on comparison CT from 2016. Additional vascular calcium throughout the abdomen and pelvis. Few phleboliths in the pelvis as well. Normal bowel gas pattern. IMPRESSION: 1. Osseous demineralization, curvature, and advanced degenerative changes may limit detection of subtle fracture. No definite acute osseous injury. 2. Mild stepwise retrolisthesis L2-L4 with grade 1 anterolisthesis L4 on L5. 3. Moderate discogenic and facet  degenerative changes throughout the lumbar spine. 4. Enlarging bilobed abdominal aortic aneurysm measuring up to 4.7 cm at this time. Angiographic imaging may be of clinical utility in the setting of atraumatic acute onset low back pain. Critical Value/emergent results were called by telephone at the time of interpretation on 10/24/2019 at 1:37 am to provider [facility] , who verbally acknowledged these results. Electronically Signed   By: Lovena Le M.D.   On: 10/24/2019 01:35   US RENAL  Result Date: 10/24/2019 CLINICAL DATA:  Acute renal failure with chronic kidney disease. EXAM: RENAL / URINARY TRACT ULTRASOUND COMPLETE COMPARISON:  Abdominopelvic CT earlier today. Pararenal ultrasound and abdominal CT 06/23/2014 FINDINGS: Right Kidney: Renal measurements: 9.4 x 5.4 x 5.8 cm = volume: 153 mL. Right hydronephrosis which appears similar to prior imaging. There is mild thinning of the right renal parenchyma with borderline increased renal echogenicity. No evidence of focal mass. No renal stones demonstrated by ultrasound. Left Kidney: Renal measurements: 10.7 x 5.4 x 5.3 cm = volume: 169 mL. No hydronephrosis. Normal renal echogenicity. No evidence of focal renal mass. No renal calculi demonstrated sonographically. Bladder: Decompressed by Foley catheter not well evaluated. Other: None. IMPRESSION: 1. Chronic right hydronephrosis which may be related to UPJ obstruction. This is unchanged dating back to 2016. There is thinning of the right renal parenchyma. 2. No left hydronephrosis. Electronically Signed   By: Keith Rake M.D.   On: 10/24/2019 14:25        Scheduled Meds: . vitamin C  500 mg Oral Daily  . carvedilol  6.25 mg Oral BID WC  . Chlorhexidine Gluconate Cloth  6 each Topical Daily  . cilostazol  50 mg Oral BID  . heparin  5,000 Units Subcutaneous Q8H  . insulin aspart  0-5 Units Subcutaneous QHS  . insulin aspart  0-9 Units Subcutaneous TID WC  . iron polysaccharides  150  mg Oral BID  . nystatin   Topical BID  . pantoprazole  40 mg Oral Daily  . pravastatin  80 mg Oral q morning - 10a   Continuous Infusions: . sodium chloride 75 mL/hr at 10/25/19 0955  . cefTRIAXone (ROCEPHIN)  IV 1 g (10/25/19 0813)     LOS: 1 day    Time spent: 25 minutes    Edwin Dada, MD [facility] 10/25/2019, 2:56 PM     Please page though [place] or Epic secure chat:  For [employer], Adult nurse

## 2019-10-25 NOTE — Evaluation (Signed)
Physical Therapy Evaluation Patient Details Name: Ariana Shea MRN: [id number] DOB: 1933/10/31 Today's Date: 10/25/2019   History of Present Illness  Ariana Shea is a 86 y.o. female with medical history of hypertension, diabetes mellitus, peripheral vascular disease, abdominal aortic aneurysm, hyperlipidemia presenting with bilateral lower back pain, right greater than left side that began on 10/22/2019 after taking out the garbage.  The patient had subjective fevers and chills for the last 2 days.  She denies any vomiting or diarrhea but has had some nausea.  She denies any abdominal pain, dysuria, hematuria, chest pain, shortness breath, hematochezia, melena, hematuria.  She has been taking some intermittent ibuprofen for her back pain.  She states that the pain is worse with any type of movement.In the emergency department, CT of the abdomen and pelvis showed infrarenal abdominal aortic aneurysm measuring 3.7 cm.  There was no periaortic stranding to suggest rupture.  She had chronically dilated right extrarenal pelvis likely secondary to UPJ obstruction.  There was bilateral urothelial thickening of the proximal ureters.  She was afebrile and hemodynamically stable with oxygen saturation 100%.  WBC 10.3, hemoglobin 13.0, platelets 252,000.  Serum creatinine 2.73 with potassium 6.1.  The patient was treated with calcium gluconate and Lokelma.  EKG shows sinus rhythm with nonspecific T wave changes.  UA showed > 50 WBC.    Clinical Impression  Patient limited for functional mobility as stated below secondary to BLE weakness, fatigue and poor standing balance.  Patient limited to a few steps at bedside secondary to c/o fatigue/weakness, tolerated sitting up in chair for a minutes before c/o nausea with episode of mild vomiting, then put back to bed - RN notified.  Patient will benefit from continued physical therapy in hospital and recommended venue below to increase strength, balance, endurance for safe  ADLs and gait.     Follow Up Recommendations SNF    Equipment Recommendations  None recommended by PT    Recommendations for Other Services       Precautions / Restrictions Precautions Precautions: Fall Restrictions Weight Bearing Restrictions: No      Mobility  Bed Mobility Overal bed mobility: Needs Assistance Bed Mobility: Supine to Sit;Sit to Supine     Supine to sit: Min guard;Min assist Sit to supine: Supervision;Min guard   General bed mobility comments: increased time, labored movement  Transfers Overall transfer level: Needs assistance Equipment used: Rolling walker (2 wheeled);None Transfers: Sit to/from [employer] to Stand: Min assist Stand pivot transfers: Min assist       General transfer comment: very unsteady without AD, required RW for safety  Ambulation/Gait Ambulation/Gait assistance: Min assist Gait Distance (Feet): 15 Feet Assistive device: Rolling walker (2 wheeled) Gait Pattern/deviations: Decreased step length - right;Decreased step length - left;Decreased stride length Gait velocity: decreased   General Gait Details: slow labored cadence, limited to ambulation in room due to c/o fatigue and generalized weakness  Stairs            Wheelchair Mobility    Modified Rankin (Stroke Patients Only)       Balance Overall balance assessment: Needs assistance Sitting-balance support: Feet supported;No upper extremity supported Sitting balance-Leahy Scale: Good Sitting balance - Comments: seated at EOB   Standing balance support: During functional activity;No upper extremity supported Standing balance-Leahy Scale: Poor Standing balance comment: fair using RW  Pertinent Vitals/Pain Pain Assessment: Faces Faces Pain Scale: Hurts a little bit Pain Location: posterior neck Pain Descriptors / Indicators: Aching Pain Intervention(s): Limited activity within patient's  tolerance;Monitored during session    [place] expects to be discharged to:: Private residence Living Arrangements: Alone Available Help at Discharge: Family;Available PRN/intermittently Type of Home: Apartment Home Access: Elevator       Home Equipment: Walker - 4 wheels      Prior Function Level of Independence: Independent with assistive device(s)         Comments: household ambulator using Rollator     Hand Dominance   Dominant Hand: Right    Extremity/Trunk Assessment   Upper Extremity Assessment Upper Extremity Assessment: Generalized weakness    Lower Extremity Assessment Lower Extremity Assessment: Generalized weakness    Cervical / Trunk Assessment Cervical / Trunk Assessment: Normal  Communication   Communication: No difficulties  Cognition Arousal/Alertness: Awake/alert Behavior During Therapy: WFL for tasks assessed/performed Overall Cognitive Status: Within Functional Limits for tasks assessed                                        General Comments      Exercises     Assessment/Plan    PT Assessment Patient needs continued PT services  PT Problem List Decreased strength;Decreased activity tolerance;Decreased balance;Decreased mobility       PT Treatment Interventions Balance training;Gait training;Functional mobility training;Therapeutic activities;Therapeutic exercise;Patient/family education;Stair training    PT Goals (Current goals can be found in the Care Plan section)  Acute Rehab PT Goals Patient Stated Goal: get stronger to return home PT Goal Formulation: With patient Time For Goal Achievement: 11/08/19 Potential to Achieve Goals: Good    Frequency Min 3X/week   Barriers to discharge        Co-evaluation               AM-PAC PT "6 Clicks" Mobility  Outcome Measure Help needed turning from your back to your side while in a flat bed without using bedrails?: None Help needed  moving from lying on your back to sitting on the side of a flat bed without using bedrails?: A Little Help needed moving to and from a bed to a chair (including a wheelchair)?: A Little Help needed standing up from a chair using your arms (e.g., wheelchair or bedside chair)?: A Little Help needed to walk in hospital room?: A Lot Help needed climbing 3-5 steps with a railing? : A Lot 6 Click Score: 17    End of Session   Activity Tolerance: Patient tolerated treatment well;Patient limited by fatigue Patient left: in bed;with call bell/phone within reach;with bed alarm set Nurse Communication: Mobility status PT Visit Diagnosis: Unsteadiness on feet (R26.81);Other abnormalities of gait and mobility (R26.89);Muscle weakness (generalized) (M62.81)    Time: 1005-1036 PT Time Calculation (min) (ACUTE ONLY): 31 min   Charges:   PT Evaluation $PT Eval Moderate Complexity: 1 Mod PT Treatments $Therapeutic Activity: 23-37 mins        12:28 PM, 10/25/19 Lonell Grandchild, MPT Physical Therapist with [facility] 336 [phone] office [phone] mobile phone

## 2019-10-25 NOTE — Progress Notes (Signed)
[facility] Progress Note  Name: Ariana Shea MRN: [id number] DOB: 05-19-1934  Chief Complaint:  Lower back pain  Subjective:  Patient had 2.0 liters UOP over 5/5 charted. She had a foley placed.  We transitioned to a bicarb gtt yesterday.  She states she's never seen urology.    Review of systems:  Some nausea  No vomiting  Denies shortness of breath  No cp  Not eating well she states -------------- Background on consult:  Ariana Shea is a 86 y.o. female with a history of hypertension, diabetes, peripheral vascular disease, abdominal aortic aneurysm who came into the hospital with lower back pain.  She has found it difficult to urinate.  Denies n/v and states has been eating and drinking ok.  She had a CT abdomen pelvis which demonstrated infrarenal abdominal aortic aneurysm at 3.7 cm without concern for threatened rupture.  Imaging also was notable for left iliac artery stenting, chronically dilated right extra renal pelvis likely secondary to UPJ obstruction and bilateral urothelial thickening.  There is not a recent baseline available in the [facility] system however creatinine was 1.0 in 2016 as below.  She has had episodes of AKI with a peak creatinine of 8.9 in 2016.  She has been on lisinopril 20 mg daily at home.  Cr 2.73 and BUN 114 with K 6.1 and bicarb 13.  She was given lokelma and calcium and was started on normal saline.  Nothing running on my exam. Repeat electrolytes pending.    Intake/Output Summary (Last 24 hours) at 10/25/2019 0824 Last data filed at 10/25/2019 0335 Gross per 24 hour  Intake 2271.12 ml  Output 1950 ml  Net 321.12 ml    Vitals:  Vitals:   10/24/19 1834 10/24/19 2005 10/24/19 2109 10/25/19 0507  BP: (!) 103/91  101/83 121/69  Pulse: (!) 102  96 94  Resp: 18  19 19   Temp:   97.7 F (36.5 C) 98 F (36.7 C)  TempSrc:   Oral Oral  SpO2: 98% 98% 97% 92%  Weight:      Height:         Physical Exam:  General: adult female in  NAD  HEENT: NCAT Eyes: EOMI; sclera anicteric Neck: supple trachea midline Heart: S1S2 no rub Lungs: clear to auscultation and unlabored Abdomen: soft/NT on my exam/ ND Extremities: no edema appreciated; no cyanosis or clubbing  Skin: no rash on extremities exposed  Neuro: alert and oriented x 3; provides hx and follows commands  Medications reviewed   Labs:  BMP Latest Ref Rng & Units 10/25/2019 10/24/2019 10/24/2019  Glucose 70 - 99 mg/dL 90 165(H) 56(L)  BUN 8 - 23 mg/dL 76(H) 99(H) 110(H)  Creatinine 0.44 - 1.00 mg/dL 1.28(H) 1.96(H) 2.22(H)  Sodium 135 - 145 mmol/L 143 139 136  Potassium 3.5 - 5.1 mmol/L 3.5 5.0 6.2(H)  Chloride 98 - 111 mmol/L 106 114(H) 115(H)  CO2 22 - 32 mmol/L 26 16(L) 13(L)  Calcium 8.9 - 10.3 mg/dL 8.1(L) 8.9 9.1     Assessment/Plan:   # AKI  - Chronic obstruction noted with decreased reserve as well as severe vascular disease.  Seems dry on exam.  UA 30 mg/dL protein and 0-5 RBC.   - Continue foley - D/c bicarb and transition to NS x 24 hrs while here - Hold lisinopril - Resolving with supportive care - fluids, foley.  Cr down to 1.28.   # Obstructive uropathy  - Chronic but now with difficulty voiding  this admission; chronic right hydronephrosis may be related to UPJ obstruction - Continue catheter  - Would consult urology    # Hyperkalemia - Setting of AKI and lisinopril - Hold lisinopril   - Resolved with medical management - Renal diabetic diet   # Metabolic acidosis  - Resolved with bicarb gtt  - Discontinue bicarb gtt   # Pyelonephritis  - per primary team   # HTN  - Controlled  - Would hold lisinopril   Nephrology will sign off.  Cr down to 1.28.  She should follow-up with urology for chronic obstruction and her PCP for repeat BMP in one week.  Claudia Desanctis, MD 10/25/2019 8:38 AM

## 2019-10-25 NOTE — Plan of Care (Signed)
  Problem: Acute Rehab PT Goals(only PT should resolve) Goal: Pt Will Go Supine/Side To Sit Outcome: Progressing Flowsheets (Taken 10/25/2019 1229) Pt will go Supine/Side to Sit:  with modified independence  with supervision Goal: Patient Will Transfer Sit To/From Stand Outcome: Progressing Flowsheets (Taken 10/25/2019 1229) Patient will transfer sit to/from stand:  with modified independence  with supervision Goal: Pt Will Transfer Bed To Chair/Chair To Bed Outcome: Progressing Flowsheets (Taken 10/25/2019 1229) Pt will Transfer Bed to Chair/Chair to Bed:  min guard assist  with supervision Goal: Pt Will Ambulate Outcome: Progressing Flowsheets (Taken 10/25/2019 1229) Pt will Ambulate:  50 feet  with min guard assist  with rolling walker   12:30 PM, 10/25/19 Lonell Grandchild, MPT Physical Therapist with [facility] [phone] office [phone] mobile phone

## 2019-10-26 LAB — CBC
HCT: 35.5 % — ABNORMAL LOW (ref 36.0–46.0)
Hemoglobin: 11.1 g/dL — ABNORMAL LOW (ref 12.0–15.0)
MCH: 30.4 pg (ref 26.0–34.0)
MCHC: 31.3 g/dL (ref 30.0–36.0)
MCV: 97.3 fL (ref 80.0–100.0)
Platelets: 248 10*3/uL (ref 150–400)
RBC: 3.65 MIL/uL — ABNORMAL LOW (ref 3.87–5.11)
RDW: 13.2 % (ref 11.5–15.5)
WBC: 8.1 10*3/uL (ref 4.0–10.5)
nRBC: 0 % (ref 0.0–0.2)

## 2019-10-26 LAB — RENAL FUNCTION PANEL
Albumin: 3.5 g/dL (ref 3.5–5.0)
Anion gap: 19 — ABNORMAL HIGH (ref 5–15)
BUN: 41 mg/dL — ABNORMAL HIGH (ref 8–23)
CO2: 24 mmol/L (ref 22–32)
Calcium: 8.3 mg/dL — ABNORMAL LOW (ref 8.9–10.3)
Chloride: 107 mmol/L (ref 98–111)
Creatinine, Ser: 1.04 mg/dL — ABNORMAL HIGH (ref 0.44–1.00)
GFR calc Af Amer: 58 mL/min — ABNORMAL LOW (ref >60)
GFR calc non Af Amer: 50 mL/min — ABNORMAL LOW (ref >60)
Glucose, Bld: 150 mg/dL — ABNORMAL HIGH (ref 70–99)
Phosphorus: 2.3 mg/dL — ABNORMAL LOW (ref 2.5–4.6)
Potassium: 3.9 mmol/L (ref 3.5–5.1)
Sodium: 150 mmol/L — ABNORMAL HIGH (ref 135–145)

## 2019-10-26 LAB — GLUCOSE, CAPILLARY
Glucose-Capillary: 129 mg/dL — ABNORMAL HIGH (ref 70–99)
Glucose-Capillary: 133 mg/dL — ABNORMAL HIGH (ref 70–99)
Glucose-Capillary: 155 mg/dL — ABNORMAL HIGH (ref 70–99)
Glucose-Capillary: 175 mg/dL — ABNORMAL HIGH (ref 70–99)
Glucose-Capillary: 153 mg/dL — ABNORMAL HIGH (ref 70–99)

## 2019-10-26 LAB — URINE CULTURE: Culture: >=100000 — AB

## 2019-10-26 LAB — LACTIC ACID, PLASMA: Lactic Acid, Venous: 1.1 mmol/L (ref 0.5–1.9)

## 2019-10-26 MED ORDER — ALUM & MAG HYDROXIDE-SIMETH 200-200-20 MG/5ML PO SUSP: 30.0000 mL | Freq: Four times a day (QID) | ORAL | Status: DC | PRN

## 2019-10-26 MED ORDER — DEXTROSE 5 % IV SOLN: INTRAVENOUS | Status: AC

## 2019-10-26 MED ORDER — ENOXAPARIN SODIUM 40 MG/0.4ML ~~LOC~~ SOLN
40.0000 mg | SUBCUTANEOUS | Status: DC
  Administered 2019-10-26: 18:00:00 40 mg via SUBCUTANEOUS
  Filled 2019-10-26: qty 0.4

## 2019-10-26 MED ORDER — CEFAZOLIN SODIUM-DEXTROSE 1-4 GM/50ML-% IV SOLN
1.0000 g | Freq: Three times a day (TID) | INTRAVENOUS | Status: DC
  Administered 2019-10-26 – 2019-10-27 (×4): 1 g via INTRAVENOUS
  Filled 2019-10-26 (×10): qty 50

## 2019-10-26 MED ORDER — ONDANSETRON HCL 4 MG/2ML IJ SOLN
4.0000 mg | Freq: Once | INTRAMUSCULAR | Status: AC
  Administered 2019-10-26: 18:00:00 4 mg via INTRAVENOUS
  Filled 2019-10-26: qty 2

## 2019-10-26 MED ORDER — PANTOPRAZOLE SODIUM 40 MG PO TBEC
40.0000 mg | DELAYED_RELEASE_TABLET | Freq: Once | ORAL | Status: DC
  Filled 2019-10-26: qty 1

## 2019-10-26 MED ORDER — DEXTROSE 5 % IV SOLN: INTRAVENOUS | Status: DC

## 2019-10-26 NOTE — Progress Notes (Signed)
CSW in contact with Debbie from [facility]. [facility] Pol[facility] confirms that patient can transfer on Saturday. Debbie in admissions requests to be contacted on her cell. Will leave [place] phone number in [facility] handoff.   [place] Transitions [facility] Care  Clinical Social Worker  Ph: [phone]

## 2019-10-26 NOTE — Progress Notes (Signed)
Pharmacy Antibiotic Note  Ariana Shea is a 86 y.o. female admitted on 10/23/2019 with Klebsiella UTI.  Pharmacy has been consulted for Cefazolin dosing.  Plan: Cefazolin 1000 mg IV every 8 hours. Monitor labs, c/s, and patient improvement.  Height: 5\' 3"  (160 cm) Weight: 58 kg (127 lb 13.9 oz) IBW/kg (Calculated) : 52.4  Temp (24hrs), Avg:97.8 F (36.6 C), Min:97.5 F (36.4 C), Max:98 F (36.7 C)  Recent Labs  Lab 10/24/19 0138 10/24/19 0936 10/24/19 1323 10/25/19 0512 10/26/19 0504 10/26/19 0849  WBC 10.3  --   --  7.2 8.1  --   CREATININE 2.73* 2.22* 1.96* 1.28* 1.04*  --   LATICACIDVEN  --   --   --   --   --  1.1    Estimated Creatinine Clearance: 33.9 mL/min (A) (by C-G formula based on SCr of 1.04 mg/dL (H)).    Allergies  Allergen Reactions  . Red Blood Cells Other (See Comments)    Patient is a Jehovah's Witness  . Penicillins Rash  . Sulfonamide Derivatives Rash    CTX 5/5 >>5/7 Cefazolin 5/7 >>  5/5 Ucx: Klebsiella- sens to cephalosporins  Thank you for allowing pharmacy to be a part of this patient's care.  Ramond Craver 10/26/2019 10:46 AM

## 2019-10-26 NOTE — Progress Notes (Signed)
CSW has contacted family and informed them of the facilities that have extended a bed offer. Family expresses that they're going to choose to go with [facility]. CSW in contact with Debbie in admissions at [facility]. Jackelyn Poling states that they are willing to accept patient once insurance authorization returns.  [place] Trans[facility] Care  Clinical Social Worker  Ph: [phone]

## 2019-10-26 NOTE — Progress Notes (Signed)
[employer] insurance in contact with CSW to report that insurance authorization has been completed.   Authorization # [id number], good for 5 days. Start date: 10/26/2019 Next Review Date: 10/30/2019

## 2019-10-26 NOTE — Care Management Important Message (Signed)
Important Message  Patient Details  Name: Ariana Shea MRN: [id number] Date of Birth: [birth year]   Medicare Important Message Given:  Yes     Tommy Medal 10/26/2019, 3:42 PM

## 2019-10-26 NOTE — Progress Notes (Signed)
CSW has started insurance authorization. Reference #- [id number]. CSW has faxed off requested documentation to [facility] intake.   Will continue to follow patient for placement and discharge related needs.  [place] Transitions of Care  Clinical Social Worker  Ph: [phone]

## 2019-10-26 NOTE — Progress Notes (Signed)
Phone call out to patients son, Vadie Pearl @ [phone] and given an update on patients status per request.   Elodia Florence RN

## 2019-10-26 NOTE — Progress Notes (Signed)
Anticipated patient discharge this morning, but she had uncontrollable nausea and was unable to take anything by mouth all day.    Bed available.  Will provide IV fluids this afternoon, support with nausea medication, extra pantoprazole.  Likely to SNF tomorrow.     Recommendations for Outpatient Follow-up:  1. Follow up with PCP Dr. Daron Offer 1 week after hospital discharge 2. Follow up with Urology, Dr. Alyson Ingles in 2-3 weeks for chronic UPJ obstruction 3. Finish three more days cephalexin for UTI Please obtain BMP on Monday May 10

## 2019-10-26 NOTE — Progress Notes (Signed)
CSW in contact with patient to inform her of the recommendation of SNF set forth by physical therapist. Patient is agreeable and expresses her preference for a location that is located "outside of [place]". Patient can not recall the name of the SNF at this time. Patient is also agreeable to placement in the [place] area.   CSW competed FL2 form and sent to SNFs through the [facility] in the [place] and [place] area.  CSW will continue to follow patient for discharge related needs. CSW will update patient once placement is secured. CSW currently waiting to speak with a representative to begin insurance authorization through [facility].   [place] Transitions of Care  Clinical Social Worker  Ph: [phone]

## 2019-10-26 NOTE — NC FL2 (Signed)
[place] MEDICAID FL2 LEVEL OF CARE SCREENING TOOL     IDENTIFICATION  Patient Name: Ariana Shea Birthdate: 18-Dec-1933 Sex: female Admission Date (Current Location): 10/23/2019  [place] and [place] Number:  [place] [id number] [place] and Address:  [place] [address] [place]      Provider Number: [id number]  Attending Physician Name and Address:  Edwin Dada, *  Relative Name and Phone Number:  Ariana Shea [phone]    Current Level of Care: Hospital Recommended Level of Care: [place] Prior Approval Number:    Date Approved/Denied:   PASRR Number: [id number]  Discharge Plan: SNF    Current Diagnoses: Patient Active Problem List   Diagnosis Date Noted  . AKI (acute kidney injury) ([place]) 10/24/2019  . Acute pyelonephritis 10/24/2019  . Acute renal failure superimposed on stage 3b chronic kidney disease ([place]) 10/24/2019  . Hyperkalemia   . Skin infection, left side of neck near carotid endarterectomy scar 11/19/2015  . Hyperlipidemia 12/11/2014  . Abdominal aortic aneurysm ([place]) 06/30/2014  . Normocytic anemia 06/28/2014  . Hypophosphatemia 06/26/2014  . Hydronephrosis of right kidney 06/26/2014  . Renal stones 06/26/2014  . Hypokalemia 06/26/2014  . UTI (urinary tract infection) 06/23/2014  . Hypomagnesemia 06/23/2014  . Hypocalcemia 06/23/2014  . Metabolic acidosis [id number]  . Hypertension 06/22/2014  . DM (diabetes mellitus), type 2 with peripheral vascular complications ([place]) [id number]  . ARF (acute renal failure) ([place]) 06/22/2014  . PVD (peripheral vascular disease) ([place]) 06/22/2014  . GERD (gastroesophageal reflux disease) 06/22/2014  . Pure hypercholesterolemia 06/22/2014  . Nausea vomiting and diarrhea 06/22/2014  . Dysphagia, unspecified(787.20) 11/23/2013  . HTN (hypertension) 06/07/2013  . Carotid artery disease ([place]) 11/30/2012  . Peripheral arterial disease ([place])   .  COLONIC POLYPS 08/12/2010  . DM 08/12/2010  . ANEMIA, IRON DEFICIENCY 08/12/2010  . Peripheral vascular disease ([place]) 08/12/2010    Orientation RESPIRATION BLADDER Height & Weight     Self, Time, Situation, Place  Normal Continent Weight: 127 lb 13.9 oz (58 kg) Height:  5\' 3"  (160 cm)  BEHAVIORAL SYMPTOMS/MOOD NEUROLOGICAL BOWEL NUTRITION STATUS      Continent    AMBULATORY STATUS COMMUNICATION OF NEEDS Skin   Limited Assist Verbally Normal, Other (Comment)(Moisture associated skin damage)                       Personal Care Assistance Level of Assistance  Bathing, Feeding, Dressing Bathing Assistance: Limited assistance Feeding assistance: Independent Dressing Assistance: Limited assistance     Functional Limitations Info  Sight, Hearing, Speech Sight Info: Adequate Hearing Info: Adequate Speech Info: Adequate    SPECIAL CARE FACTORS FREQUENCY  PT (By licensed PT), OT (By licensed OT)     PT Frequency: 3-5x weekly OT Frequency: 3x weekly            Contractures Contractures Info: Not present    Additional Factors Info  Code Status, Allergies Code Status Info: Full code Allergies Info: Red Blood Cells, Penicillins, Sulfonamide Derivatives           Current Medications (10/26/2019):  This is the current hospital active medication list Current Facility-Administered Medications  Medication Dose Route Frequency Provider Last Rate Last Admin  . acetaminophen (TYLENOL) tablet 650 mg  650 mg Oral Q6H PRN Tat, David, MD       Or  . acetaminophen (TYLENOL) suppository 650 mg  650 mg Rectal Q6H PRN Orson Eva, MD      .  ascorbic acid (VITAMIN C) tablet 500 mg  500 mg Oral Daily Tat, David, MD   500 mg at 10/26/19 0852  . carvedilol (COREG) tablet 6.25 mg  6.25 mg Oral BID WC Orson Eva, MD   6.25 mg at 10/26/19 0851  . Chlorhexidine Gluconate Cloth 2 % PADS 6 each  6 each Topical Daily Tat, David, MD   6 each at 10/26/19 [phone]  . cilostazol (PLETAL) tablet 50 mg   50 mg Oral BID Orson Eva, MD   50 mg at 10/26/19 0851  . dextrose 5 % solution   Intravenous Continuous Edwin Dada, MD 75 mL/hr at 10/26/19 0850 New Bag at 10/26/19 0850  . enoxaparin (LOVENOX) injection 40 mg  40 mg Subcutaneous Q24H Danford, Christopher P, MD      . insulin aspart (novoLOG) injection 0-5 Units  0-5 Units Subcutaneous QHS Tat, David, MD      . insulin aspart (novoLOG) injection 0-9 Units  0-9 Units Subcutaneous TID WC Orson Eva, MD   1 Units at 10/26/19 [phone]  . iron polysaccharides (NIFEREX) capsule 150 mg  150 mg Oral BID Orson Eva, MD   150 mg at 10/26/19 0852  . loperamide (IMODIUM) capsule 2 mg  2 mg Oral PRN Danford, Suann Larry, MD      . melatonin tablet 6 mg  6 mg Oral QHS PRN Lang Snow, FNP      . nystatin (MYCOSTATIN/NYSTOP) topical powder   Topical BID Orson Eva, MD   Given at 10/26/19 [phone]  . ondansetron (ZOFRAN) tablet 4 mg  4 mg Oral Q6H PRN Tat, David, MD       Or  . ondansetron (ZOFRAN) injection 4 mg  4 mg Intravenous Q6H PRN Orson Eva, MD   4 mg at 10/26/19 0841  . pantoprazole (PROTONIX) EC tablet 40 mg  40 mg Oral Daily Tat, David, MD   40 mg at 10/26/19 0853  . potassium chloride SA (KLOR-CON) CR tablet 20 mEq  20 mEq Oral Once Harrie Jeans C, MD      . pravastatin (PRAVACHOL) tablet 80 mg  80 mg Oral q morning - [place] Silos, MD   80 mg at 10/26/19 [id number]     Discharge Medications: Please see discharge summary for a list of discharge medications.  Relevant Imaging Results:  Relevant Lab Results:   Additional Information SS# [id number]  [place], LCSW

## 2019-10-26 NOTE — Progress Notes (Signed)
PROGRESS NOTE    Ariana Shea  [id number] DOB: 1934/02/02 DOA: 10/23/2019 PCP: Nickola Major, MD      Brief Narrative:  Ariana Shea is a 86 y.o. F with PVD, AAA in monitoring, DM, and HTN who presented with back pain for 2 days.  Patient was in [place] until few days prior to admission when she developed low back pain bilaterally, as well as muscle aches.    In the ER, she was noted to have acute kidney injury Cr 2.7 up from baseline 1.3, as well as hyperkalemia, and pyuria/bacteriuria.  She was given IV fluids and IV antibiotics in the hospital service were asked to evaluate.          Assessment & Plan:  Acute kidney injury on CKD IIIb Hyperkalemia Uremia Cr 2.7 on admission from baseline 1.1-1.3.  Nephrology consulted, lisinopril held and bicarb fluids given and creatinine subsequently normalized.  BUN imrpoving.   -Continue IV fluids 24 more hours then stop -Discussed foley with urology, okay to attempt voiding trial  Hypernatremia -Push oral fluids -Repeat BMP in 3-4 days, tomorrow if still in hospital  Elevated anion gap metabolic acidosis Not in DKA.  Doubt starvation ketosis.  Lactate checked and normal.  Unclear cause, appears clinically well. -Repeat BMP in 3-4 days, tomorrow if still in hospital  Pyelonephritis Urine culture growing Klebsiella, sensitivities show sensitive to Cefazolin. -Continue antibiotics, narrow to cefazolin, transition to cephalexin at discharge  UPJ obstruction This appears chronic on radiography, but was incidetnal.  Discussed with urology, Dr. Noah Delaine by phone.  He recommends outpatient follow-up in 7 to 14 days.   -Follow up with Dr. Noah Delaine in 7-14 days  Possible bladder outlet obstruction Foley placed in ER.  No clear reason for bladder obstruction in this female, patient foley removed, voiding well. -Follow up with Urology  Diabetes Glucose controlled -Continue SS corrections  Peripheral vascular disease,  secondary prevention Hypertension BP controlled -Continue carvedilol -Continue cilostazol, pravasttatin  Iron deficiency -Continue iron  GERD -Continue PPI       Disposition: Status is: Inpatient  Remains inpatient appropriate because: no safe discharge plan   Dispo: The patient is from: Home              Anticipated d/c is to: SNF              Anticipated d/c date is: 1 day              Patient currently is medically stable to d/c.               MDM: The below labs and imaging reports reviewed and summarized above.  Medication management as above.   DVT prophylaxis: Lovenox Code Status: FULL Family Communication:     Consultants:   Nephrology  Procedures:   5/5 renal US  5/5 CT abdomen and pelvi  Antimicrobials:   Ceftriaxone 5/4 >> 5/7  Cefazolin 5/7 >>  Culture data:   Urine culture Klebsiella             Subjective: Still nauseated.  No fever, flank pain, confusion, diarrhea, chest pain, back pain.  Objective: Vitals:   10/25/19 2016 10/25/19 2122 10/26/19 0241 10/26/19 0543  BP:  (!) 156/77 (!) 166/81 (!) 141/61  Pulse:  97 97 94  Resp:  18 16 19   Temp:  97.7 F (36.5 C) 97.9 F (36.6 C) 98 F (36.7 C)  TempSrc:    Oral  SpO2: (!) 86% 99% 95% 91%  Weight:      Height:        Intake/Output Summary (Last 24 hours) at 10/26/2019 1145 Last data filed at 10/26/2019 0650 Gross per 24 hour  Intake 1800.76 ml  Output 670 ml  Net 1130.76 ml   Filed Weights   10/23/19 2150 10/24/19 0639  Weight: 59 kg 58 kg    Examination: General appearance: Elderly female, lying in bed, appears tired, interactive     HEENT: Icteric, conjunctival pink, lids lashes normal.  No nasal deformity, discharge, or epistaxis.  Lips normal, edentulous, oropharynx moist, no oral lesions, hearing normal. Skin: Skin warm and dry, no suspicious rashes or lesions. Cardiac: Regular rate and rhythm, no murmurs, JVP is normal, no lower extremity  edema. Respiratory: Normal respiratory rate and rhythm, lungs clear without rales or wheezes, lung sounds diminished, shallow effort. Abdomen: No tenderness palpation or guarding, no distention or hepatosplenomegaly. MSK: Normal muscle bulk and tone for age. Neuro: Extraocular movements intact, moves all extremities with generalized weakness but symmetric strength, speech fluent. Psych: Attention normal, affect blunted, judgment insight appear normal.      Data Reviewed: I have personally reviewed following labs and imaging studies:  CBC: Recent Labs  Lab 10/24/19 0138 10/25/19 0512 10/26/19 0504  WBC 10.3 7.2 8.1  HGB 13.0 10.6* 11.1*  HCT 41.5 32.3* 35.5*  MCV 97.0 92.3 97.3  PLT 252 233 [id number]   Basic Metabolic Panel: Recent Labs  Lab 10/24/19 0138 10/24/19 0936 10/24/19 1323 10/25/19 0512 10/26/19 0504  NA 134* 136 139 143 150*  K 6.1* 6.2* 5.0 3.5 3.9  CL 113* 115* 114* 106 107  CO2 13* 13* 16* 26 24  GLUCOSE 77 56* 165* 90 150*  BUN 114* 110* 99* 76* 41*  CREATININE 2.73* 2.22* 1.96* 1.28* 1.04*  CALCIUM 9.0 9.1 8.9 8.1* 8.3*  MG  --  1.8  --   --   --   PHOS  --   --   --   --  2.3*   GFR: Estimated Creatinine Clearance: 33.9 mL/min (A) (by C-G formula based on SCr of 1.04 mg/dL (H)). Liver Function Tests: Recent Labs  Lab 10/26/19 0504  ALBUMIN 3.5   No results for input(s): LIPASE, AMYLASE in the last 168 hours. No results for input(s): AMMONIA in the last 168 hours. Coagulation Profile: No results for input(s): INR, PROTIME in the last 168 hours. Cardiac Enzymes: No results for input(s): CKTOTAL, CKMB, CKMBINDEX, TROPONINI in the last 168 hours. BNP (last 3 results) No results for input(s): PROBNP in the last 8760 hours. HbA1C: Recent Labs    10/24/19 0936  HGBA1C 6.2*   CBG: Recent Labs  Lab 10/25/19 1241 10/25/19 1703 10/25/19 2124 10/26/19 0241 10/26/19 0804  GLUCAP 98 112* 137* 129* 133*   Lipid Profile: No results for input(s):  CHOL, HDL, LDLCALC, TRIG, CHOLHDL, LDLDIRECT in the last 72 hours. Thyroid Function Tests: No results for input(s): TSH, T4TOTAL, FREET4, T3FREE, THYROIDAB in the last 72 hours. Anemia Panel: No results for input(s): VITAMINB12, FOLATE, FERRITIN, TIBC, IRON, RETICCTPCT in the last 72 hours. Urine analysis:    Component Value Date/Time   COLORURINE YELLOW 10/24/2019 0015   APPEARANCEUR CLOUDY (A) 10/24/2019 0015   LABSPEC 1.013 10/24/2019 0015   PHURINE 5.0 10/24/2019 0015   GLUCOSEU NEGATIVE 10/24/2019 0015   HGBUR NEGATIVE 10/24/2019 0015   BILIRUBINUR NEGATIVE 10/24/2019 0015   KETONESUR 5 (A) 10/24/2019 0015   PROTEINUR 30 (A) 10/24/2019 0015   UROBILINOGEN 0.2 06/22/2014  [place] 10/24/2019 0015   LEUKOCYTESUR LARGE (A) 10/24/2019 0015   Sepsis Labs: @LABRCNTIP (procalcitonin:4,lacticacidven:4)  ) Recent Results (from the past 240 hour(s))  Urine Culture     Status: Abnormal   Collection Time: 10/24/19  1:14 AM   Specimen: Urine, Clean Catch  Result Value Ref Range Status   Specimen Description   Final    URINE, CLEAN CATCH Performed at [facility], [address], [place], [place] [address]    Special Requests   Final    NONE Performed at [facility], [address], [place], [place] [address]    Culture >=100,000 COLONIES/mL KLEBSIELLA PNEUMONIAE (A)  Final   Report Status 10/26/2019 FINAL  Final   Organism ID, Bacteria KLEBSIELLA PNEUMONIAE (A)  Final      Susceptibility   Klebsiella pneumoniae - MIC*    AMPICILLIN >=32 RESISTANT Resistant     CEFAZOLIN <=4 SENSITIVE Sensitive     CEFTRIAXONE <=1 SENSITIVE Sensitive     CIPROFLOXACIN <=0.25 SENSITIVE Sensitive     GENTAMICIN <=1 SENSITIVE Sensitive     IMIPENEM 1 SENSITIVE Sensitive     NITROFURANTOIN 64 INTERMEDIATE Intermediate     TRIMETH/SULFA <=20 SENSITIVE Sensitive     AMPICILLIN/SULBACTAM 4 SENSITIVE Sensitive     PIP/TAZO <=4 SENSITIVE Sensitive     * >=100,000 COLONIES/mL KLEBSIELLA  PNEUMONIAE  Respiratory Panel by RT PCR (Flu A&B, Covid) - Nasopharyngeal Swab     Status: None   Collection Time: 10/24/19  3:58 AM   Specimen: Nasopharyngeal Swab  Result Value Ref Range Status   SARS Coronavirus 2 by RT PCR NEGATIVE NEGATIVE Final    Comment: (NOTE) SARS-CoV-2 target nucleic acids are NOT DETECTED. The SARS-CoV-2 RNA is generally detectable in upper respiratoy specimens during the acute phase of infection. The lowest concentration of SARS-CoV-2 viral copies this assay can detect is 131 copies/mL. A negative result does not preclude SARS-Cov-2 infection and should not be used as the sole basis for treatment or other patient management decisions. A negative result may occur with  improper specimen collection/handling, submission of specimen other than nasopharyngeal swab, presence of viral mutation(s) within the areas targeted by this assay, and inadequate number of viral copies (<131 copies/mL). A negative result must be combined with clinical observations, patient history, and epidemiological information. The expected result is Negative. Fact Sheet for Patients:  PinkCheek.be Fact Sheet for Healthcare Providers:  GravelBags.it This test is not yet ap proved or cleared by the [place] FDA and  has been authorized for detection and/or diagnosis of SARS-CoV-2 by FDA under an Emergency Use Authorization (EUA). This EUA will remain  in effect (meaning this test can be used) for the duration of the COVID-19 declaration under Section 564(b)(1) of the Act, 21 U.S.C. section 360bbb-3(b)(1), unless the authorization is terminated or revoked sooner.    Influenza A by PCR NEGATIVE NEGATIVE Final   Influenza B by PCR NEGATIVE NEGATIVE Final    Comment: (NOTE) The Xpert Xpress SARS-CoV-2/FLU/RSV assay is intended as an aid in  the diagnosis of influenza from Nasopharyngeal swab specimens and  should not be used  as a sole basis for treatment. Nasal washings and  aspirates are unacceptable for Xpert Xpress SARS-CoV-2/FLU/RSV  testing. Fact Sheet for Patients: PinkCheek.be Fact Sheet for Healthcare Providers: GravelBags.it This test is not yet approved or cleared by the [place] FDA and  has been authorized for detection and/or diagnosis of SARS-CoV-2 by  FDA under an Emergency Use Authorization (  EUA). This EUA will remain  in effect (meaning this test can be used) for the duration of the  Covid-19 declaration under Section 564(b)(1) of the Act, 21  U.S.C. section 360bbb-3(b)(1), unless the authorization is  terminated or revoked. Performed at [facility], [address] [place], [place] [address]          Radiology Studies: US RENAL  Result Date: 10/24/2019 CLINICAL DATA:  Acute renal failure with chronic kidney disease. EXAM: RENAL / URINARY TRACT ULTRASOUND COMPLETE COMPARISON:  Abdominopelvic CT earlier today. Pararenal ultrasound and abdominal CT 06/23/2014 FINDINGS: Right Kidney: Renal measurements: 9.4 x 5.4 x 5.8 cm = volume: 153 mL. Right hydronephrosis which appears similar to prior imaging. There is mild thinning of the right renal parenchyma with borderline increased renal echogenicity. No evidence of focal mass. No renal stones demonstrated by ultrasound. Left Kidney: Renal measurements: 10.7 x 5.4 x 5.3 cm = volume: 169 mL. No hydronephrosis. Normal renal echogenicity. No evidence of focal renal mass. No renal calculi demonstrated sonographically. Bladder: Decompressed by Foley catheter not well evaluated. Other: None. IMPRESSION: 1. Chronic right hydronephrosis which may be related to UPJ obstruction. This is unchanged dating back to 2016. There is thinning of the right renal parenchyma. 2. No left hydronephrosis. Electronically Signed   By: Keith Rake M.D.   On: 10/24/2019 14:25        Scheduled Meds: . vitamin  C  500 mg Oral Daily  . carvedilol  6.25 mg Oral BID WC  . Chlorhexidine Gluconate Cloth  6 each Topical Daily  . cilostazol  50 mg Oral BID  . enoxaparin (LOVENOX) injection  40 mg Subcutaneous Q24H  . insulin aspart  0-5 Units Subcutaneous QHS  . insulin aspart  0-9 Units Subcutaneous TID WC  . iron polysaccharides  150 mg Oral BID  . nystatin   Topical BID  . pantoprazole  40 mg Oral Daily  . potassium chloride  20 mEq Oral Once  . pravastatin  80 mg Oral q morning - 10a   Continuous Infusions: .  ceFAZolin (ANCEF) IV    . dextrose 75 mL/hr at 10/26/19 0850     LOS: 2 days    Time spent: 25 minutes    Edwin Dada, MD [facility] 10/26/2019, 11:45 AM     Please page though [place] or Epic secure chat:  For [employer], Adult nurse

## 2019-10-27 LAB — BASIC METABOLIC PANEL
Anion gap: 13 (ref 5–15)
BUN: 22 mg/dL (ref 8–23)
CO2: 30 mmol/L (ref 22–32)
Calcium: 8.4 mg/dL — ABNORMAL LOW (ref 8.9–10.3)
Chloride: 101 mmol/L (ref 98–111)
Creatinine, Ser: 0.87 mg/dL (ref 0.44–1.00)
GFR calc Af Amer: >60 mL/min (ref >60)
GFR calc non Af Amer: >60 mL/min (ref >60)
Glucose, Bld: 129 mg/dL — ABNORMAL HIGH (ref 70–99)
Potassium: 3 mmol/L — ABNORMAL LOW (ref 3.5–5.1)
Sodium: 144 mmol/L (ref 135–145)

## 2019-10-27 LAB — GLUCOSE, CAPILLARY
Glucose-Capillary: 120 mg/dL — ABNORMAL HIGH (ref 70–99)
Glucose-Capillary: 150 mg/dL — ABNORMAL HIGH (ref 70–99)

## 2019-10-27 MED ORDER — ACETAMINOPHEN 325 MG PO TABS: 650.0000 mg | ORAL_TABLET | Freq: Four times a day (QID) | ORAL | Status: DC | PRN

## 2019-10-27 MED ORDER — ONDANSETRON HCL 4 MG PO TABS: 4.0000 mg | ORAL_TABLET | Freq: Four times a day (QID) | ORAL | 0 refills | Status: DC | PRN

## 2019-10-27 MED ORDER — CEPHALEXIN 500 MG PO CAPS: 500.0000 mg | ORAL_CAPSULE | Freq: Two times a day (BID) | ORAL | Status: DC

## 2019-10-27 MED ORDER — CEPHALEXIN 500 MG PO CAPS: 500.0000 mg | ORAL_CAPSULE | Freq: Two times a day (BID) | ORAL | 0 refills | Status: DC

## 2019-10-27 MED ORDER — POTASSIUM CHLORIDE CRYS ER 20 MEQ PO TBCR
40.0000 meq | EXTENDED_RELEASE_TABLET | Freq: Once | ORAL | Status: AC
  Administered 2019-10-27: 15:00:00 40 meq via ORAL
  Filled 2019-10-27: qty 2

## 2019-10-27 NOTE — Progress Notes (Addendum)
CSW spoke to [place] in admissions at [facility] who stated the patient has been offered a bed and has been accepted and that the pt can arrive today on 10/27/19.  The pt's accepting doctor is the SNF MD.  The room number will be B24.  The number for report is [phone].  CSW will update RN.  Alphonse Guild. Robbie Rideaux, Latanya Presser, LCAS Clinical Social Worker Ph: [phone]

## 2019-10-27 NOTE — Discharge Summary (Addendum)
Physician Discharge Summary  Ariana Shea [id number] DOB: [birth year] DOA: 10/23/2019  PCP: Ariana Major, MD  Admit date: 10/23/2019 Discharge date: 10/27/2019  Admitted From: Home  Disposition:  SNF  Recommendations for Outpatient Follow-up and new medication changes:  1. Follow up with Dr. Daron Shea in 7 days.  2. Continue antibiotic therapy with Cephalexin for 5 more days.  3. Hold lisinopril to prevent recurrent AKI and hyperkalemia.  4. Avoid nonsteroidal antiinflammatory agents.   Home Health: na   Equipment/Devices: na    Discharge Condition: stable  CODE STATUS: full  Diet recommendation: heart healthy   Brief/Interim Summary: Patient was admitted to the hospital with the working diagnosis of acute kidney injury on chronic kidney disease stage III complicated with hyperkalemia in the setting of pyelonephritis and obstructive uropathy.    86 year old female who presented with back pain.  She does have significant past medical history for hypertension, type 2 diabetes mellitus, peripheral vascular disease, dyslipidemia and abdominal aortic aneurysm.  She reported bilateral lower back pain, right greater than left, symptoms started after taking out the garbage.  Her back pain was refractive to oral ibuprofen.  On her initial physical examination blood pressure 126/46, heart rate 89, respiratory rate 20, temperature 97.6, oxygen saturation 96%, her lungs had bibasilar rales, heart S1-S2 present and rhythmic, soft abdomen, bilateral CVA tenderness, no lower extremity edema.  Sodium 134, potassium 6.1, chloride 113, bicarbonate 13, glucose 77, BUN 14, creatinine 2.73, white count 10.3, hemoglobin 13.0, hematocrit 41.5, platelets 252.  SARS COVID-19 negative.  Urine analysis more than 50 white cells, 0-5 red cells, specific gravity 1.013.  Protein 30.  CT of the abdomen with abdominal aortic aneurysm, 3.7 cm.  Chronically dilated right extrarenal pelvis likely secondary to UPJ obstruction  urothelial thickening proximal ureters and collecting systems. X rays with lumbar spine with degenerative joint disease.  EKG 87 bpm, normal axis, normal intervals, sinus rhythm, no ST segment or T wave changes.  Patient received medical therapy with intravenous fluids, nephrotoxic agents were discontinued, her kidney function has improved.  Patient has been seen by physical therapy, recommendations to continue care at the skilled nursing facility.  1.  Acute kidney injury on chronic kidney disease stage IIIb with hyperkalemia/ hypernatremia/ non anion gap metabolic acidosis.  Likely prerenal kidney injury, patient received intravenous fluids (including bicarb drip and isotonic saline.), nephrotoxic agents were held.  Patient's kidney function and electrolytes improved, at discharge her creatinine 0.87, sodium 144, potassium 3.0, bicarb 22.  Patient will receive to 40 meq potassium chloride before discharge.  Recommend follow-up kidney function as an outpatient.  2.  Chronic obstructive uropathy, complicated by pyelonephritis.  Present on admission.  Her urine culture was positive for Klebsiella, sensitive to cephalosporins. Patient was treated with IV cephalosporins during her hospitalization, she will continue antibiotic therapy with cephalexin for 5 more days.   Patient initially had a Foley catheter which has been removed with good toleration.  Patient follow-up with urologist outpatient, case discussed with Dr. Alyson Shea on phone.  Patient's back pain improved with analgesics.   3.  Type 2 diabetes mellitus/ dyslipidemia.  Patient received insulin sliding scale while hospitalized.  Her glucose remained well controlled. At discharge patient will resume basal insulin 25 units of glargine.   Continue with pravastatin.   4.  Peripheral vascular disease, hypertension. Continue carvedilol for blood pressure control. Continue with cilostazol.   5.  Iron deficiency anemia. Hgb and Hct stable,  patient will continue iron supplementation.  6.  GERD. Continue antiacid therapy with proton pump inhibitor.    Discharge Diagnoses:  Active Problems:   AKI (acute kidney injury) ([place])   Acute pyelonephritis   Acute renal failure superimposed on stage 3b chronic kidney disease (HCC)   Hyperkalemia    Discharge Instructions   Allergies as of 10/27/2019      Reactions   Red Blood Cells Other (See Comments)   Patient is a Jehovah's Witness   Penicillins Rash   Sulfonamide Derivatives Rash      Medication List    STOP taking these medications   HYDROcodone-acetaminophen 5-325 MG tablet Commonly known as: NORCO/VICODIN   ibuprofen 200 MG tablet Commonly known as: ADVIL   lisinopril 20 MG tablet Commonly known as: ZESTRIL     TAKE these medications   acetaminophen 325 MG tablet Commonly known as: TYLENOL Take 2 tablets (650 mg total) by mouth every 6 (six) hours as needed for mild pain (or Fever >/= 101).   carvedilol 25 MG tablet Commonly known as: COREG Take 1 tablet (25 mg total) by mouth 2 (two) times daily with a meal.   cephALEXin 500 MG capsule Commonly known as: KEFLEX Take 1 capsule (500 mg total) by mouth every 12 (twelve) hours for 5 days.   cilostazol 50 MG tablet Commonly known as: PLETAL Take 1 tablet (50 mg total) by mouth 2 (two) times daily.   Ferrex 150 150 MG capsule Generic drug: iron polysaccharides Take 150 mg by mouth 2 (two) times daily.   insulin glargine 100 UNIT/ML injection Commonly known as: LANTUS Inject 25 Units into the skin 2 (two) times daily.   magnesium oxide 400 MG tablet Commonly known as: MAG-OX Take 1 tablet (400 mg total) by mouth 2 (two) times daily.   omeprazole 20 MG capsule Commonly known as: PRILOSEC Take 1 capsule (20 mg total) by mouth every morning.   ondansetron 4 MG tablet Commonly known as: ZOFRAN Take 1 tablet (4 mg total) by mouth every 6 (six) hours as needed for nausea.   pravastatin 80 MG  tablet Commonly known as: PRAVACHOL Take 1 tablet (80 mg total) by mouth every morning.   vitamin C 500 MG tablet Commonly known as: ASCORBIC ACID Take 500 mg by mouth daily.      Follow-up Information    Ariana Shea, Ariana Furbish, MD. Schedule an appointment as soon as possible for a visit in 2 week(s).   Specialty: Urology Contact information: [address] [place] [address] [phone]        Ariana Major, MD. Schedule an appointment as soon as possible for a visit in 1 week(s).   Specialty: Family Medicine Why: Make an appointment one week after SNF discharge Contact information: [address] [place] [address] [phone]          Allergies  Allergen Reactions  . Red Blood Cells Other (See Comments)    Patient is a Jehovah's Witness  . Penicillins Rash  . Sulfonamide Derivatives Rash    Consultations:  Nephrology    Procedures/Studies: CT ABDOMEN PELVIS WO CONTRAST  Result Date: 10/24/2019 CLINICAL DATA:  AAA on x-ray EXAM: CT ABDOMEN AND PELVIS WITHOUT CONTRAST TECHNIQUE: Multidetector CT imaging of the abdomen and pelvis was performed following the standard protocol without IV contrast. COMPARISON:  Same-day lumbar radiograph, CT renal colic [id number] FINDINGS: Lower chest: Bandlike opacities in the lung bases compatible with atelectasis and/or scarring Normal heart size. No pericardial effusion. Calcifications of  the coronary arteries and mitral annulus. Hepatobiliary: No focal liver abnormality is seen. Patient is post cholecystectomy. Slight prominence of the biliary tree likely related to reservoir effect. No calcified intraductal gallstones. Pancreas: Unremarkable. No pancreatic ductal dilatation or surrounding inflammatory changes. Spleen: Stable calcified lesion in the spleen, almost certainly benign. Could reflect a partially calcified granuloma versus hemangioma or lymphangioma given a slightly hypoattenuating appearance on  comparison imaging. Otherwise normal spleen. Adrenals/Urinary Tract: Normal adrenal glands. Mild bilateral perinephric stranding, left slightly greater than right. Chronically dilated right extrarenal pelvis likely secondary to UPJ obstruction. Bilateral urothelial thickening is noted of the proximal ureters and collecting systems. There are bilateral punctate calcifications likely reflecting either vascular calcium rich tiny collecting system calculi without visible obstructive urolithiasis at this time. No worrisome renal mass. Urinary bladder appears grossly unremarkable without significant wall thickening or stranding. Stomach/Bowel: Distal esophagus, stomach and duodenal sweep are unremarkable. No small bowel wall thickening or dilatation. No evidence of obstruction. Numerous fecaliths are present within the otherwise normal appendix in the right lower quadrant. There is pancolonic diverticulosis. No convincing evidence of acute diverticulitis. Pelvic floor laxity with possible rectocele. Vascular/Lymphatic: There is extensive severe atherosclerotic calcification throughout the aorta and branch vessels. There is mild lobular fusiform dilatation at the level the celiac access and renal artery origins measuring up to 2.8 cm. Larger bilobed infrarenal abdominal aortic aneurysm measures up to 3.7 cm in size. Increased diameter on the same day lumbar radiographs may be spuriously increased with radiographic technique. Some hypodense mural thrombus is noted with partial calcification. No periaortic stranding or acute findings to suggest threatened rupture. Left iliac artery stenting is noted. Extensive calcification of the inflow and outflow vessels is seen. Reproductive: Patient is post hysterectomy. Radiopaque pessary device noted within the vaginal canal. Other: No abdominopelvic free fluid or air. No bowel containing hernias. Pelvic floor laxity as above. Musculoskeletal: The osseous structures appear diffusely  demineralized which may limit detection of small or nondisplaced fractures. No acute osseous abnormality or suspicious osseous lesion. Levocurvature of the lumbar spine is similar to comparison is. Multilevel degenerative changes are present in the imaged portions of the spine. IMPRESSION: 1. Bilobed infrarenal abdominal aortic aneurysm measures up to 3.7 cm in size. Increased diameter on the same day lumbar radiographs may be spuriously increased with radiographic technique. No periaortic stranding or acute findings to suggest threatened rupture. Pararenal fusiform dilatation is stable as well. 2. Left iliac artery stenting. Luminal evaluation of the vasculature cannot be assessed in the absence of contrast media. 3. Chronically dilated right extrarenal pelvis likely secondary to UPJ obstruction. Bilateral urothelial thickening is noted of the proximal ureters and collecting systems, nonspecific but can be seen with a ascending urinary tract infection. Correlate with urinalysis. 4. Bilateral punctate calcifications likely reflecting either vascular calcium rich tiny collecting system calculi without visible obstructive urolithiasis at this time. 5. Pancolonic diverticulosis without convincing evidence of acute diverticulitis. 6. Pelvic floor laxity with possible rectocele. Correlate with symptoms. 7. Aortic Atherosclerosis (ICD10-I70.0). Electronically Signed   By: Lovena Le M.D.   On: 10/24/2019 04:04   DG Lumbar Spine Complete  Result Date: 10/24/2019 CLINICAL DATA:  Low back pain, difficulty with ambulation, no known injury EXAM: LUMBAR SPINE - COMPLETE 4+ VIEW COMPARISON:  CT 06/25/2014 FINDINGS: The osseous structures appear diffusely demineralized which may limit detection of small or nondisplaced fractures. Five lumbar type vertebral bodies are visualized. There is levocurvature of the lumbar spine with an apex at the L3 level.  Mild stepwise retrolisthesis L2-L4 with grade 1 anterolisthesis L4 on L5.  Findings on a likely degenerative basis given advanced facet arthropathic changes throughout the lumbar spine and multilevel disc height loss with vacuum disc and discogenic endplate changes. 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 Additional degenerative changes of the hips and SI joints. Extensive aortoiliac atherosclerosis with a bilobed abdominal aortic aneurysm measuring up to 4.7 cm in diameter, increased from 3.2 cm on comparison CT from 2016. Additional vascular calcium throughout the abdomen and pelvis. Few phleboliths in the pelvis as well. Normal bowel gas pattern. IMPRESSION: 1. Osseous demineralization, curvature, and advanced degenerative changes may limit detection of subtle fracture. No definite acute osseous injury. 2. Mild stepwise retrolisthesis L2-L4 with grade 1 anterolisthesis L4 on L5. 3. Moderate discogenic and facet degenerative changes throughout the lumbar spine. 4. Enlarging bilobed abdominal aortic aneurysm measuring up to 4.7 cm at this time. Angiographic imaging may be of clinical utility in the setting of atraumatic acute onset low back pain. Critical Value/emergent results were called by telephone at the time of interpretation on 10/24/2019 at 1:37 am to provider [facility] , who verbally acknowledged these results. Electronically Signed   By: Lovena Le M.D.   On: 10/24/2019 01:35   US RENAL  Result Date: 10/24/2019 CLINICAL DATA:  Acute renal failure with chronic kidney disease. EXAM: RENAL / URINARY TRACT ULTRASOUND COMPLETE COMPARISON:  Abdominopelvic CT earlier today. Pararenal ultrasound and abdominal CT 06/23/2014 FINDINGS: Right Kidney: Renal measurements: 9.4 x 5.4 x 5.8 cm = volume: 153 mL. Right hydronephrosis which appears similar to prior imaging. There is mild thinning of the right renal parenchyma with borderline increased renal echogenicity. No evidence of focal mass. No renal stones demonstrated by ultrasound. Left Kidney: Renal  measurements: 10.7 x 5.4 x 5.3 cm = volume: 169 mL. No hydronephrosis. Normal renal echogenicity. No evidence of focal renal mass. No renal calculi demonstrated sonographically. Bladder: Decompressed by Foley catheter not well evaluated. Other: None. IMPRESSION: 1. Chronic right hydronephrosis which may be related to UPJ obstruction. This is unchanged dating back to 2016. There is thinning of the right renal parenchyma. 2. No left hydronephrosis. Electronically Signed   By: Keith Rake M.D.   On: 10/24/2019 14:25        Subjective: Patient is feeling better, no further nausea or vomiting, no chest pain or dyspnea.   Discharge Exam: Vitals:   10/26/19 2201 10/27/19 0503  BP: (!) 154/74 (!) 158/101  Pulse: 94 96  Resp: 16 16  Temp: 97.9 F (36.6 C) 98.3 F (36.8 C)  SpO2: 99% 96%   Vitals:   10/26/19 1556 10/26/19 2023 10/26/19 2201 10/27/19 0503  BP: (!) 144/71  (!) 154/74 (!) 158/101  Pulse: 86 86 94 96  Resp: 16 16 16 16   Temp: 97.6 F (36.4 C)  97.9 F (36.6 C) 98.3 F (36.8 C)  TempSrc: Oral  Oral Oral  SpO2: 91% (!) 82% 99% 96%  Weight:      Height:        General: Not in pain or dyspnea.  Neurology: Awake and alert, non focal  E ENT: mild pallor, no icterus, oral mucosa moist Cardiovascular: No JVD. S1-S2 present, rhythmic, no gallops, rubs, or murmurs. No lower extremity edema. Pulmonary: positive breath sounds bilaterally, adequate air movement, no wheezing, rhonchi or rales. Gastrointestinal. Abdomen with no organomegaly, non tender, no rebound or guarding Skin. No rashes Musculoskeletal: no joint deformities   The results of significant diagnostics  from this hospitalization (including imaging, microbiology, ancillary and laboratory) are listed below for reference.     Microbiology: Recent Results (from the past 240 hour(s))  Urine Culture     Status: Abnormal   Collection Time: 10/24/19  1:14 AM   Specimen: Urine, Clean Catch  Result Value Ref Range  Status   Specimen Description   Final    URINE, CLEAN CATCH Performed at [facility], [address], [place], [place] [address]    Special Requests   Final    NONE Performed at [facility], [address] [place], [place] [address]    Culture >=100,000 COLONIES/mL KLEBSIELLA PNEUMONIAE (A)  Final   Report Status 10/26/2019 FINAL  Final   Organism ID, Bacteria KLEBSIELLA PNEUMONIAE (A)  Final      Susceptibility   Klebsiella pneumoniae - MIC*    AMPICILLIN >=32 RESISTANT Resistant     CEFAZOLIN <=4 SENSITIVE Sensitive     CEFTRIAXONE <=1 SENSITIVE Sensitive     CIPROFLOXACIN <=0.25 SENSITIVE Sensitive     GENTAMICIN <=1 SENSITIVE Sensitive     IMIPENEM 1 SENSITIVE Sensitive     NITROFURANTOIN 64 INTERMEDIATE Intermediate     TRIMETH/SULFA <=20 SENSITIVE Sensitive     AMPICILLIN/SULBACTAM 4 SENSITIVE Sensitive     PIP/TAZO <=4 SENSITIVE Sensitive     * >=100,000 COLONIES/mL KLEBSIELLA PNEUMONIAE  Respiratory Panel by RT PCR (Flu A&B, Covid) - Nasopharyngeal Swab     Status: None   Collection Time: 10/24/19  3:58 AM   Specimen: Nasopharyngeal Swab  Result Value Ref Range Status   SARS Coronavirus 2 by RT PCR NEGATIVE NEGATIVE Final    Comment: (NOTE) SARS-CoV-2 target nucleic acids are NOT DETECTED. The SARS-CoV-2 RNA is generally detectable in upper respiratoy specimens during the acute phase of infection. The lowest concentration of SARS-CoV-2 viral copies this assay can detect is 131 copies/mL. A negative result does not preclude SARS-Cov-2 infection and should not be used as the sole basis for treatment or other patient management decisions. A negative result may occur with  improper specimen collection/handling, submission of specimen other than nasopharyngeal swab, presence of viral mutation(s) within the areas targeted by this assay, and inadequate number of viral copies (<131 copies/mL). A negative result must be combined with clinical observations, patient history,  and epidemiological information. The expected result is Negative. Fact Sheet for Patients:  PinkCheek.be Fact Sheet for Healthcare Providers:  GravelBags.it This test is not yet ap proved or cleared by the [place] FDA and  has been authorized for detection and/or diagnosis of SARS-CoV-2 by FDA under an Emergency Use Authorization (EUA). This EUA will remain  in effect (meaning this test can be used) for the duration of the COVID-19 declaration under Section 564(b)(1) of the Act, 21 U.S.C. section 360bbb-3(b)(1), unless the authorization is terminated or revoked sooner.    Influenza A by PCR NEGATIVE NEGATIVE Final   Influenza B by PCR NEGATIVE NEGATIVE Final    Comment: (NOTE) The Xpert Xpress SARS-CoV-2/FLU/RSV assay is intended as an aid in  the diagnosis of influenza from Nasopharyngeal swab specimens and  should not be used as a sole basis for treatment. Nasal washings and  aspirates are unacceptable for Xpert Xpress SARS-CoV-2/FLU/RSV  testing. Fact Sheet for Patients: PinkCheek.be Fact Sheet for Healthcare Providers: GravelBags.it This test is not yet approved or cleared by the [place] FDA and  has been authorized for detection and/or diagnosis of SARS-CoV-2 by  FDA under an Emergency Use Authorization (EUA). This  EUA will remain  in effect (meaning this test can be used) for the duration of the  Covid-19 declaration under Section 564(b)(1) of the Act, 21  U.S.C. section 360bbb-3(b)(1), unless the authorization is  terminated or revoked. Performed at [facility], [address], [place], [place] [address]      Labs: BNP (last 3 results) No results for input(s): BNP in the last 8760 hours. Basic Metabolic Panel: Recent Labs  Lab 10/24/19 0936 10/24/19 1323 10/25/19 0512 10/26/19 0504 10/27/19 0610  NA 136 139 143 150* 144  K 6.2* 5.0 3.5  3.9 3.0*  CL 115* 114* 106 107 101  CO2 13* 16* 26 24 30   GLUCOSE 56* 165* 90 150* 129*  BUN 110* 99* 76* 41* 22  CREATININE 2.22* 1.96* 1.28* 1.04* 0.87  CALCIUM 9.1 8.9 8.1* 8.3* 8.4*  MG 1.8  --   --   --   --   PHOS  --   --   --  2.3*  --    Liver Function Tests: Recent Labs  Lab 10/26/19 0504  ALBUMIN 3.5   No results for input(s): LIPASE, AMYLASE in the last 168 hours. No results for input(s): AMMONIA in the last 168 hours. CBC: Recent Labs  Lab 10/24/19 0138 10/25/19 0512 10/26/19 0504  WBC 10.3 7.2 8.1  HGB 13.0 10.6* 11.1*  HCT 41.5 32.3* 35.5*  MCV 97.0 92.3 97.3  PLT 252 233 248   Cardiac Enzymes: No results for input(s): CKTOTAL, CKMB, CKMBINDEX, TROPONINI in the last 168 hours. BNP: Invalid input(s): POCBNP CBG: Recent Labs  Lab 10/26/19 1217 10/26/19 1627 10/26/19 2204 10/27/19 0734 10/27/19 1138  GLUCAP 155* 175* 153* 120* 150*   D-Dimer No results for input(s): DDIMER in the last 72 hours. Hgb A1c No results for input(s): HGBA1C in the last 72 hours. Lipid Profile No results for input(s): CHOL, HDL, LDLCALC, TRIG, CHOLHDL, LDLDIRECT in the last 72 hours. Thyroid function studies No results for input(s): TSH, T4TOTAL, T3FREE, THYROIDAB in the last 72 hours.  Invalid input(s): FREET3 Anemia work up No results for input(s): VITAMINB12, FOLATE, FERRITIN, TIBC, IRON, RETICCTPCT in the last 72 hours. Urinalysis    Component Value Date/Time   COLORURINE YELLOW 10/24/2019 0015   APPEARANCEUR CLOUDY (A) 10/24/2019 0015   LABSPEC 1.013 10/24/2019 0015   PHURINE 5.0 10/24/2019 0015   GLUCOSEU NEGATIVE 10/24/2019 0015   HGBUR NEGATIVE 10/24/2019 0015   BILIRUBINUR NEGATIVE 10/24/2019 0015   KETONESUR 5 (A) 10/24/2019 0015   PROTEINUR 30 (A) 10/24/2019 0015   UROBILINOGEN 0.2 06/22/2014 1305   NITRITE NEGATIVE 10/24/2019 0015   LEUKOCYTESUR LARGE (A) 10/24/2019 0015   Sepsis Labs Invalid input(s): PROCALCITONIN,  WBC,   LACTICIDVEN Microbiology Recent Results (from the past 240 hour(s))  Urine Culture     Status: Abnormal   Collection Time: 10/24/19  1:14 AM   Specimen: Urine, Clean Catch  Result Value Ref Range Status   Specimen Description   Final    URINE, CLEAN CATCH Performed at [facility], [address] [place], [place] [address]    Special Requests   Final    NONE Performed at [facility], [address] [place], [place] [address]    Culture >=100,000 COLONIES/mL KLEBSIELLA PNEUMONIAE (A)  Final   Report Status 10/26/2019 FINAL  Final   Organism ID, Bacteria KLEBSIELLA PNEUMONIAE (A)  Final      Susceptibility   Klebsiella pneumoniae - MIC*    AMPICILLIN >=32 RESISTANT Resistant  CEFAZOLIN <=4 SENSITIVE Sensitive     CEFTRIAXONE <=1 SENSITIVE Sensitive     CIPROFLOXACIN <=0.25 SENSITIVE Sensitive     GENTAMICIN <=1 SENSITIVE Sensitive     IMIPENEM 1 SENSITIVE Sensitive     NITROFURANTOIN 64 INTERMEDIATE Intermediate     TRIMETH/SULFA <=20 SENSITIVE Sensitive     AMPICILLIN/SULBACTAM 4 SENSITIVE Sensitive     PIP/TAZO <=4 SENSITIVE Sensitive     * >=100,000 COLONIES/mL KLEBSIELLA PNEUMONIAE  Respiratory Panel by RT PCR (Flu A&B, Covid) - Nasopharyngeal Swab     Status: None   Collection Time: 10/24/19  3:58 AM   Specimen: Nasopharyngeal Swab  Result Value Ref Range Status   SARS Coronavirus 2 by RT PCR NEGATIVE NEGATIVE Final    Comment: (NOTE) SARS-CoV-2 target nucleic acids are NOT DETECTED. The SARS-CoV-2 RNA is generally detectable in upper respiratoy specimens during the acute phase of infection. The lowest concentration of SARS-CoV-2 viral copies this assay can detect is 131 copies/mL. A negative result does not preclude SARS-Cov-2 infection and should not be used as the sole basis for treatment or other patient management decisions. A negative result may occur with  improper specimen collection/handling, submission of specimen other than nasopharyngeal swab, presence  of viral mutation(s) within the areas targeted by this assay, and inadequate number of viral copies (<131 copies/mL). A negative result must be combined with clinical observations, patient history, and epidemiological information. The expected result is Negative. Fact Sheet for Patients:  PinkCheek.be Fact Sheet for Healthcare Providers:  GravelBags.it This test is not yet ap proved or cleared by the [place] FDA and  has been authorized for detection and/or diagnosis of SARS-CoV-2 by FDA under an Emergency Use Authorization (EUA). This EUA will remain  in effect (meaning this test can be used) for the duration of the COVID-19 declaration under Section 564(b)(1) of the Act, 21 U.S.C. section 360bbb-3(b)(1), unless the authorization is terminated or revoked sooner.    Influenza A by PCR NEGATIVE NEGATIVE Final   Influenza B by PCR NEGATIVE NEGATIVE Final    Comment: (NOTE) The Xpert Xpress SARS-CoV-2/FLU/RSV assay is intended as an aid in  the diagnosis of influenza from Nasopharyngeal swab specimens and  should not be used as a sole basis for treatment. Nasal washings and  aspirates are unacceptable for Xpert Xpress SARS-CoV-2/FLU/RSV  testing. Fact Sheet for Patients: PinkCheek.be Fact Sheet for Healthcare Providers: GravelBags.it This test is not yet approved or cleared by the [place] FDA and  has been authorized for detection and/or diagnosis of SARS-CoV-2 by  FDA under an Emergency Use Authorization (EUA). This EUA will remain  in effect (meaning this test can be used) for the duration of the  Covid-19 declaration under Section 564(b)(1) of the Act, 21  U.S.C. section 360bbb-3(b)(1), unless the authorization is  terminated or revoked. Performed at [facility], [address], [place], [place] [address]      Time coordinating discharge: 45  minutes  SIGNED:   Tawni Millers, MD  [facility] 10/27/2019, 12:42 PM

## 2019-10-29 ENCOUNTER — Encounter (HOSPITAL_COMMUNITY): Payer: Self-pay

## 2019-10-29 ENCOUNTER — Other Ambulatory Visit: Payer: Self-pay

## 2019-10-29 ENCOUNTER — Inpatient Hospital Stay (HOSPITAL_COMMUNITY)
Admission: EM | Admit: 2019-10-29 | Discharge: 2019-10-31 | DRG: 641 | Disposition: A | Discharge status: Home Health | Payer: Medicare Other | Source: Skilled Nursing Facility | Attending: Internal Medicine | Admitting: Internal Medicine

## 2019-10-29 ENCOUNTER — Emergency Department (HOSPITAL_COMMUNITY): Payer: Medicare Other

## 2019-10-29 DIAGNOSIS — E7800 Pure hypercholesterolemia, unspecified: Secondary | ICD-10-CM | POA: Diagnosis present

## 2019-10-29 DIAGNOSIS — Z8249 Family history of ischemic heart disease and other diseases of the circulatory system: Secondary | ICD-10-CM

## 2019-10-29 DIAGNOSIS — Z800 Family history of malignant neoplasm of digestive organs: Secondary | ICD-10-CM

## 2019-10-29 DIAGNOSIS — R55 Syncope and collapse: Secondary | ICD-10-CM | POA: Diagnosis present

## 2019-10-29 DIAGNOSIS — Z8261 Family history of arthritis: Secondary | ICD-10-CM

## 2019-10-29 DIAGNOSIS — M79604 Pain in right leg: Secondary | ICD-10-CM | POA: Diagnosis present

## 2019-10-29 DIAGNOSIS — R531 Weakness: Secondary | ICD-10-CM | POA: Diagnosis not present

## 2019-10-29 DIAGNOSIS — Z1611 Resistance to penicillins: Secondary | ICD-10-CM | POA: Diagnosis present

## 2019-10-29 DIAGNOSIS — M545 Low back pain: Secondary | ICD-10-CM | POA: Diagnosis present

## 2019-10-29 DIAGNOSIS — I9589 Other hypotension: Secondary | ICD-10-CM

## 2019-10-29 DIAGNOSIS — E1122 Type 2 diabetes mellitus with diabetic chronic kidney disease: Secondary | ICD-10-CM | POA: Diagnosis present

## 2019-10-29 DIAGNOSIS — N1832 Chronic kidney disease, stage 3b: Secondary | ICD-10-CM | POA: Diagnosis present

## 2019-10-29 DIAGNOSIS — I959 Hypotension, unspecified: Secondary | ICD-10-CM | POA: Diagnosis present

## 2019-10-29 DIAGNOSIS — E861 Hypovolemia: Secondary | ICD-10-CM

## 2019-10-29 DIAGNOSIS — I429 Cardiomyopathy, unspecified: Secondary | ICD-10-CM | POA: Diagnosis present

## 2019-10-29 DIAGNOSIS — E860 Dehydration: Secondary | ICD-10-CM | POA: Diagnosis not present

## 2019-10-29 DIAGNOSIS — Z85828 Personal history of other malignant neoplasm of skin: Secondary | ICD-10-CM

## 2019-10-29 DIAGNOSIS — Z803 Family history of malignant neoplasm of breast: Secondary | ICD-10-CM

## 2019-10-29 DIAGNOSIS — Z882 Allergy status to sulfonamides status: Secondary | ICD-10-CM

## 2019-10-29 DIAGNOSIS — I129 Hypertensive chronic kidney disease with stage 1 through stage 4 chronic kidney disease, or unspecified chronic kidney disease: Secondary | ICD-10-CM | POA: Diagnosis present

## 2019-10-29 DIAGNOSIS — I493 Ventricular premature depolarization: Secondary | ICD-10-CM | POA: Diagnosis present

## 2019-10-29 DIAGNOSIS — Z801 Family history of malignant neoplasm of trachea, bronchus and lung: Secondary | ICD-10-CM

## 2019-10-29 DIAGNOSIS — E1151 Type 2 diabetes mellitus with diabetic peripheral angiopathy without gangrene: Secondary | ICD-10-CM | POA: Diagnosis present

## 2019-10-29 DIAGNOSIS — Z20822 Contact with and (suspected) exposure to covid-19: Secondary | ICD-10-CM | POA: Diagnosis present

## 2019-10-29 DIAGNOSIS — N136 Pyonephrosis: Secondary | ICD-10-CM | POA: Diagnosis present

## 2019-10-29 DIAGNOSIS — E785 Hyperlipidemia, unspecified: Secondary | ICD-10-CM | POA: Diagnosis present

## 2019-10-29 DIAGNOSIS — N179 Acute kidney failure, unspecified: Secondary | ICD-10-CM | POA: Diagnosis present

## 2019-10-29 DIAGNOSIS — K219 Gastro-esophageal reflux disease without esophagitis: Secondary | ICD-10-CM | POA: Diagnosis present

## 2019-10-29 DIAGNOSIS — Z87440 Personal history of urinary (tract) infections: Secondary | ICD-10-CM

## 2019-10-29 DIAGNOSIS — Z880 Allergy status to penicillin: Secondary | ICD-10-CM

## 2019-10-29 DIAGNOSIS — E876 Hypokalemia: Secondary | ICD-10-CM | POA: Diagnosis present

## 2019-10-29 DIAGNOSIS — Z87891 Personal history of nicotine dependence: Secondary | ICD-10-CM

## 2019-10-29 DIAGNOSIS — Z79899 Other long term (current) drug therapy: Secondary | ICD-10-CM

## 2019-10-29 DIAGNOSIS — N183 Chronic kidney disease, stage 3 unspecified: Secondary | ICD-10-CM | POA: Diagnosis present

## 2019-10-29 DIAGNOSIS — I739 Peripheral vascular disease, unspecified: Secondary | ICD-10-CM | POA: Diagnosis present

## 2019-10-29 DIAGNOSIS — Z90710 Acquired absence of both cervix and uterus: Secondary | ICD-10-CM

## 2019-10-29 DIAGNOSIS — B963 Hemophilus influenzae [H. influenzae] as the cause of diseases classified elsewhere: Secondary | ICD-10-CM | POA: Diagnosis present

## 2019-10-29 LAB — COMPREHENSIVE METABOLIC PANEL
ALT: 16 U/L (ref 0–44)
AST: 20 U/L (ref 15–41)
Albumin: 3.3 g/dL — ABNORMAL LOW (ref 3.5–5.0)
Alkaline Phosphatase: 57 U/L (ref 38–126)
Anion gap: 11 (ref 5–15)
BUN: 21 mg/dL (ref 8–23)
CO2: 30 mmol/L (ref 22–32)
Calcium: 8.4 mg/dL — ABNORMAL LOW (ref 8.9–10.3)
Chloride: 100 mmol/L (ref 98–111)
Creatinine, Ser: 1.71 mg/dL — ABNORMAL HIGH (ref 0.44–1.00)
GFR calc Af Amer: 31 mL/min — ABNORMAL LOW (ref >60)
GFR calc non Af Amer: 27 mL/min — ABNORMAL LOW (ref >60)
Glucose, Bld: 138 mg/dL — ABNORMAL HIGH (ref 70–99)
Potassium: 3.5 mmol/L (ref 3.5–5.1)
Sodium: 141 mmol/L (ref 135–145)
Total Bilirubin: 0.7 mg/dL (ref 0.3–1.2)
Total Protein: 5.6 g/dL — ABNORMAL LOW (ref 6.5–8.1)

## 2019-10-29 LAB — CBC
HCT: 35.7 % — ABNORMAL LOW (ref 36.0–46.0)
Hemoglobin: 11 g/dL — ABNORMAL LOW (ref 12.0–15.0)
MCH: 29.4 pg (ref 26.0–34.0)
MCHC: 30.8 g/dL (ref 30.0–36.0)
MCV: 95.5 fL (ref 80.0–100.0)
Platelets: 205 10*3/uL (ref 150–400)
RBC: 3.74 MIL/uL — ABNORMAL LOW (ref 3.87–5.11)
RDW: 12.3 % (ref 11.5–15.5)
WBC: 9.3 10*3/uL (ref 4.0–10.5)
nRBC: 0 % (ref 0.0–0.2)

## 2019-10-29 LAB — TROPONIN I (HIGH SENSITIVITY)
Troponin I (High Sensitivity): 34 ng/L — ABNORMAL HIGH (ref <18)
Troponin I (High Sensitivity): 31 ng/L — ABNORMAL HIGH (ref <18)

## 2019-10-29 LAB — GLUCOSE, CAPILLARY
Glucose-Capillary: 121 mg/dL — ABNORMAL HIGH (ref 70–99)
Glucose-Capillary: 140 mg/dL — ABNORMAL HIGH (ref 70–99)

## 2019-10-29 LAB — SARS CORONAVIRUS 2 BY RT PCR (HOSPITAL ORDER, PERFORMED IN ~~LOC~~ HOSPITAL LAB): SARS Coronavirus 2: NEGATIVE

## 2019-10-29 LAB — LACTIC ACID, PLASMA: Lactic Acid, Venous: 1.6 mmol/L (ref 0.5–1.9)

## 2019-10-29 MED ORDER — POLYETHYLENE GLYCOL 3350 17 G PO PACK: 17.0000 g | PACK | Freq: Every day | ORAL | Status: DC | PRN

## 2019-10-29 MED ORDER — ADULT MULTIVITAMIN W/MINERALS CH
1.0000 | ORAL_TABLET | Freq: Every day | ORAL | Status: DC
  Administered 2019-10-29 – 2019-10-31 (×3): 1 via ORAL
  Filled 2019-10-29 (×3): qty 1

## 2019-10-29 MED ORDER — ACETAMINOPHEN 650 MG RE SUPP: 650.0000 mg | Freq: Four times a day (QID) | RECTAL | Status: DC | PRN

## 2019-10-29 MED ORDER — INSULIN ASPART 100 UNIT/ML ~~LOC~~ SOLN
0.0000 [IU] | Freq: Three times a day (TID) | SUBCUTANEOUS | Status: DC
  Administered 2019-10-31: 3 [IU] via SUBCUTANEOUS

## 2019-10-29 MED ORDER — SODIUM CHLORIDE 0.9 % IV BOLUS
1000.0000 mL | Freq: Once | INTRAVENOUS | Status: AC
  Administered 2019-10-29: 1000 mL via INTRAVENOUS

## 2019-10-29 MED ORDER — SODIUM CHLORIDE 0.45 % IV SOLN: INTRAVENOUS | Status: DC

## 2019-10-29 MED ORDER — INSULIN ASPART 100 UNIT/ML ~~LOC~~ SOLN: 0.0000 [IU] | Freq: Every day | SUBCUTANEOUS | Status: DC

## 2019-10-29 MED ORDER — ACETAMINOPHEN 325 MG PO TABS
650.0000 mg | ORAL_TABLET | Freq: Four times a day (QID) | ORAL | Status: DC | PRN
  Administered 2019-10-30 – 2019-10-31 (×3): 650 mg via ORAL
  Filled 2019-10-29 (×3): qty 2

## 2019-10-29 MED ORDER — ENOXAPARIN SODIUM 30 MG/0.3ML ~~LOC~~ SOLN
30.0000 mg | SUBCUTANEOUS | Status: DC
  Administered 2019-10-29: 30 mg via SUBCUTANEOUS
  Filled 2019-10-29: qty 0.3

## 2019-10-29 NOTE — Progress Notes (Signed)
Pharmacy Note regarding Insulin Glargine (Lantus):       Pharmacy consulted to find out if patient was on insulin glargine prior to admission today.  Discharge Summary from 10/27/19 includes insulin glargine 25 units subq BID but pharmacy medication reconciliation has not received [facility] fax requested from [facility] yet to confirm.  I called [facility] twice but was transferred to a line that did not pick up and the voice mailbox was full.  Informed Dr Jonnie Finner that I was unable to confirm insulin glargine and he ordered moderate dose sliding scale insulin aspart until then.  Shela Commons, PharmD, BCPS 10/29/19 7:14 PM

## 2019-10-29 NOTE — H&P (Deleted)
[facility] History & Physical  Rob Schertz MD  Samyra M Musil 10/29/2019  Chief Complaint: Presyncope HPI: The patient is a 86 y.o. year-old w/ hx of UTI, CKD 3, HTN, DM2 presented after sustaining near syncope episode after getting off the commode. Also c/o gen weakness.  Ed eval showed low BP's in the 80's which improved w/ NS bolus.  Creat up to 1.7 (creat 0.8 at recent dc 4 d ago). Asked to see for admission.   Pt's main c/o is gen weakness and presyncopal episode this am at the rehab center. Pt was here last week w/ UTI and AKI , grew Klebs pna and has known R hydronephrotic extrarenal pelvis shown by CT scan. She rec'd IV abx,and IVF's and creat 2.7 improved to 0.8 and pt was dc'd to rehab/ SNF locally in .    Pt denies any dysuria or urgency/ frequency/ odor.  Does endorse R flank pain , "for years" off and on.  No abd pain , or n/v/d.  Does have loose stools occ. No fevers or chills.  No CP or SOB.   Pt widowed 2 times ('63, and 1996), had 3 children, her dtr died this March from cancer. One son in , other not sure where he is.  Pt not interested in EOL discussion, wants full code.    Pt was just admitted here 5/4- 5/8 for back pain, was found to have UTI/ pyelo. UA showed > 50 wbc. UCx showed klebs pna resistant to amp, mostly sensistive o/w. CT showed chronically dilated R extrarenal pelvis prob d/t UPJ obstruction w/ thickened prox ureters and collecting systems which could be seen w/ ascending UTI.  Creat was 2.7 improved to 9.8 on dc. She rec'd 5 days IV abx and was given another 5 d po keflex upon dc.     ROS  denies CP  no joint pain   no HA  no blurry vision  no rash  no diarrhea  no nausea/ vomiting  no dysuria  no difficulty voiding  no change in urine color    Past Medical History  Past Medical History:  Diagnosis Date  . Allergy   . Arthritis    neck  . Cataract    bilateral - MD monitoring cataracts  . CHF (congestive heart  failure) (HCC)   . Chronic kidney disease, stage I    DR OTTELIN  HX UTIS  . Cirrhosis (HCC)   . Cramp of limb   . Diabetes mellitus   . Dysphagia, unspecified(787.20)   . Dysuria   . Epistaxis   . GERD (gastroesophageal reflux disease)   . Heart murmur    NO CARDIOLOGIST  DX FOR YEARS ASYMPTOMATIC  . Lumbago   . Neoplasm of uncertain behavior of skin   . Nonspecific elevation of levels of transaminase or lactic acid dehydrogenase (LDH)   . Osteoarthrosis, unspecified whether generalized or localized, unspecified site   . Other and unspecified hyperlipidemia    diet controlled  . Pain in joint, shoulder region   . Paresthesias 05/14/2015  . Postablative ovarian failure   . Trochanteric bursitis of left hip 01/27/2016  . Type 2 diabetes mellitus without complication (HCC)   . Unspecified essential hypertension    no meds   Past Surgical History  Past Surgical History:  Procedure Laterality Date  . BREAST BIOPSY    . CARDIAC CATHETERIZATION N/A 03/10/2016   Procedure: Left Heart Cath and Coronary Angiography;  Surgeon: Henry W Smith, MD;    Location: [facility];  Service: Cardiovascular;  Laterality: N/A;  . COLONOSCOPY  2012   Dr Orr.   . COLONOSCOPY WITH PROPOFOL N/A 08/19/2016   Procedure: COLONOSCOPY WITH PROPOFOL;  Surgeon: Daniel P Jacobs, MD;  Location: [facility] ENDOSCOPY;  Service: Endoscopy;  Laterality: N/A;  . CORONARY ARTERY BYPASS GRAFT N/A 03/11/2016   Procedure: CORONARY ARTERY BYPASS GRAFTING (CABG) x 3 USING RIGHT LEG GREATER SAPHENOUS VEIN GRAFT;  Surgeon: Steven C Hendrickson, MD;  Location: [facility] OR;  Service: Open Heart Surgery;  Laterality: N/A;  . ENDOVEIN HARVEST OF GREATER SAPHENOUS VEIN Right 03/11/2016   Procedure: ENDOVEIN HARVEST OF GREATER SAPHENOUS VEIN;  Surgeon: Steven C Hendrickson, MD;  Location: [facility] OR;  Service: Open Heart Surgery;  Laterality: Right;  . ESOPHAGEAL BANDING  05/10/2019   Procedure: ESOPHAGEAL BANDING;  Surgeon: Jacobs, Daniel P, MD;   Location: [facility] ENDOS[facility];  Service: Endoscopy;;  . ESOPHAGEAL BANDING  05/20/2019   Procedure: ESOPHAGEAL BANDING;  Surgeon: Mann, Jyothi, MD;  Location: [facility];  Service: Endoscopy;;  . ESOPHAGOGASTRODUODENOSCOPY N/A 05/20/2019   Procedure: ESOPHAGOGASTRODUODENOSCOPY (EGD);  Surgeon: Mann, Jyothi, MD;  Location: [facility];  Service: Endoscopy;  Laterality: N/A;  . ESOPHAGOGASTRODUODENOSCOPY (EGD) WITH PROPOFOL N/A 08/19/2016   Procedure: ESOPHAGOGASTRODUODENOSCOPY (EGD) WITH PROPOFOL;  Surgeon: Daniel P Jacobs, MD;  Location: [facility];  Service: Endoscopy;  Laterality: N/A;  . ESOPHAGOGASTRODUODENOSCOPY (EGD) WITH PROPOFOL N/A 05/10/2019   Procedure: ESOPHAGOGASTRODUODENOSCOPY (EGD) WITH PROPOFOL;  Surgeon: Jacobs, Daniel P, MD;  Location: [facility];  Service: Endoscopy;  Laterality: N/A;  . HEMOSTASIS CLIP PLACEMENT  05/20/2019   Procedure: HEMOSTASIS CLIP PLACEMENT;  Surgeon: Mann, Jyothi, MD;  Location: [facility];  Service: Endoscopy;;  . IR ANGIOGRAM SELECTIVE EACH ADDITIONAL VESSEL  05/21/2019  . IR EMBO ART  VEN HEMORR LYMPH EXTRAV  INC GUIDE ROADMAPPING  05/21/2019  . IR PARACENTESIS  05/21/2019  . IR TIPS  05/21/2019  . MAXIMUM ACCESS (MAS)POSTERIOR LUMBAR INTERBODY FUSION (PLIF) 1 LEVEL Left 07/23/2015   Procedure: FOR MAXIMUM ACCESS (MAS) POSTERIOR LUMBAR INTERBODY FUSION (PLIF) LUMBAR THREE-FOUR EXTRAFORAMINAL MICRODISCECTOMY LUMBAR FIVE-SACRAL ONE LEFT;  Surgeon: David S Jones, MD;  Location: [facility] NEURO ORS;  Service: Neurosurgery;  Laterality: Left;  . RADIOLOGY WITH ANESTHESIA N/A 05/21/2019   Procedure: RADIOLOGY WITH ANESTHESIA;  Surgeon: Radiologist, Medication, MD;  Location: [facility] OR;  Service: Radiology;  Laterality: N/A;  . SCLEROTHERAPY  05/20/2019   Procedure: SCLEROTHERAPY;  Surgeon: Mann, Jyothi, MD;  Location: [facility];  Service: Endoscopy;;  . TEE WITHOUT CARDIOVERSION N/A 03/11/2016   Procedure: TRANSESOPHAGEAL ECHOCARDIOGRAM (TEE);  Surgeon: Steven C  Hendrickson, MD;  Location: [facility] OR;  Service: Open Heart Surgery;  Laterality: N/A;  . TUBAL LIGATION  1982   Dr Mabry  . UPPER GASTROINTESTINAL ENDOSCOPY    . VAGINAL HYSTERECTOMY  1997   Dr Marbry   Family History  Family History  Problem Relation Age of Onset  . Lung cancer Father   . Arthritis Sister   . Arthritis Brother   . Heart disease Maternal Grandmother   . Heart disease Maternal Grandfather   . Heart disease Paternal Grandmother   . Heart disease Paternal Grandfather   . Breast cancer Mother   . Liver cancer Brother   . Breast cancer Maternal Aunt   . Breast cancer Paternal Aunt   . Colon cancer Neg Hx   . Esophageal cancer Neg Hx   . Rectal cancer Neg Hx   . Stomach cancer Neg Hx    Social   History  reports that she has never smoked. She has never used smokeless tobacco. She reports that she does not drink alcohol or use drugs. Allergies  Allergies  Allergen Reactions  . Kiwi Extract Anaphylaxis  . Tdap [Tetanus-Diphth-Acell Pertussis] Swelling and Other (See Comments)    Swelling at injection site, gets very hot  . Statins     RHABDOMYOLYSIS  . Latex Itching, Dermatitis and Rash  . Tramadol Nausea And Vomiting   Home medications Prior to Admission medications   Medication Sig Start Date End Date Taking? Authorizing Provider  acetaminophen (TYLENOL) 500 MG tablet Take 500 mg by mouth at bedtime.     [provider]  Aromatic Inhalants (VICKS VAPOR IN) Vicks Vapor Rub apply small amount to outside of nose to help breathing    [provider]  BD PEN NEEDLE NANO U/F 32G X 4 MM MISC USE THREE TIMES DAILY AS DIRECTED 01/08/19   Reed, Tiffany L, DO  Biotin 10000 MCG TABS Take 10,000 mcg by mouth every morning.    [provider]  bisacodyl (DULCOLAX) 10 MG suppository Place 1 suppository (10 mg total) rectally daily as needed for moderate constipation. 05/25/19   Aline August, MD  calcium carbonate (OS-CAL) 600 MG TABS Take 600 mg by  mouth 2 (two) times daily with a meal.      [provider]  Cholecalciferol (VITAMIN D) 50 MCG (2000 UT) CAPS Take 2,000 Units by mouth daily.     [provider]  Cyanocobalamin (VITAMIN B 12 PO) Take 1,000 mcg by mouth daily.      [provider]  ezetimibe (ZETIA) 10 MG tablet TAKE 1 TABLET(10 MG) BY MOUTH DAILY 02/07/19   Reed, Tiffany L, DO  furosemide (LASIX) 40 MG tablet Take 40 mg by mouth daily.     [provider]  glucose blood test strip One Touch Ultra II strips. Use to test blood sugar three times daily. Dx: E11.65 07/14/17   Reed, Tiffany L, DO  insulin detemir (LEVEMIR) 100 UNIT/ML injection Inject 0.2 mLs (20 Units total) into the skin at bedtime. 05/26/19   Aline August, MD  Insulin Syringe-Needle U-100 (INSULIN SYRINGE 1CC/31GX5/16") 31G X 5/16" 1 ML MISC USE AS DIRECTED DAILY WITH LEVEMIR 09/08/18   Reed, Tiffany L, DO  JARDIANCE 25 MG TABS tablet Take 25 mg by mouth daily. 06/12/19   [provider]  lactulose (CHRONULAC) 10 GM/15ML solution Take 20 g by mouth 3 (three) times daily. 06/16/19   [provider]  loratadine (CLARITIN) 10 MG tablet Take 10 mg by mouth daily as needed for allergies.    [provider]  MAGNESIUM PO Take 500 mg by mouth 2 (two) times daily in the am and at bedtime..    [provider]  Multiple Vitamins-Minerals (MULTIVITAMIN WITH MINERALS) tablet Take 1 tablet by mouth daily.      [provider]  NOVOLOG FLEXPEN 100 UNIT/ML FlexPen Inject 12 units under the skin every morning, 8 units at lunch and 12 units at supper 04/03/19   Reed, Tiffany L, DO  ondansetron (ZOFRAN) 4 MG tablet Take 1 tablet (4 mg total) by mouth every 6 (six) hours as needed for nausea. 05/25/19   Aline August, MD  pantoprazole (PROTONIX) 40 MG tablet Take 1 tablet (40 mg total) by mouth 2 (two) times daily. 05/25/19   Aline August, MD  Polyethyl Glycol-Propyl Glycol (SYSTANE OP) Place 1 drop into  both eyes 2 (two) times daily.  [provider]  Probiotic Product (PROBIOTIC DAILY PO) Take 1 capsule by mouth daily. Digestive Advantage Probiotic    [provider]  spironolactone (ALDACTONE) 50 MG tablet Take 1 tablet (50 mg total) by mouth 2 (two) times daily. 05/28/19   Reed, Tiffany L, DO   Liver Function Tests Recent Labs  Lab 06/25/19 1436 06/27/19 1042  AST 58* 62*  ALT 41* 45*  ALKPHOS  --  127*  BILITOT 3.5* 3.4*  PROT 7.8 7.2  ALBUMIN  --  3.0*   Recent Labs  Lab 06/27/19 1042  LIPASE 29   CBC Recent Labs  Lab 06/25/19 1436 06/27/19 1042 06/27/19 1056  WBC 10.4 7.7  --   NEUTROABS 8,299* 5.9  --   HGB 16.1* 14.9 15.0  HCT 47.2* 43.4 44.0  MCV 92.5 94.6  --   PLT 151 116*  --    Basic Metabolic Panel Recent Labs  Lab 06/25/19 1436 06/27/19 1042 06/27/19 1056  NA 133* 131* 133*  K 4.5 4.6 4.6  CL 96* 97*  --   CO2 24 23  --   GLUCOSE 269* 138*  --   BUN 19 20  --   CREATININE 1.05* 1.13*  --   CALCIUM 11.1* 10.0  --    Iron/TIBC/Ferritin/ %Sat    Component Value Date/Time   IRON 29 06/19/2016 1422   TIBC 427 06/19/2016 1422   FERRITIN 13 06/19/2016 1422   IRONPCTSAT 7 (L) 06/19/2016 1422    Vitals:   06/27/19 1018 06/27/19 1030 06/27/19 1045 06/27/19 1215  BP: (!) 123/51 (!) 101/46  (!) 125/54  Pulse: 89 83    Resp: 16 15  20  Temp: 97.8 F (36.6 C)  97.9 F (36.6 C)   TempSrc: Oral  Rectal   SpO2: 99% 98%      Exam Gen alert, elderly WF, no distress, calm, lying flat No rash, cyanosis or gangrene Sclera anicteric, throat clear  No jvd or bruits Chest clear bilat to bases no rales, wheezing or bronchial BS RRR no MRG Abd soft ntnd no mass or ascites +bs, no flank tenderness GU defer MS no joint effusions or deformity Ext no LE or UE edema, no wounds or ulcers Neuro is alert, Ox 3 , nf    Home meds:  - coreg 25 bid/ pravachol 80/ pletal 50 bid  - keflex  500 bid x5 days  - insluin glargine 25u bid  -  PPI/ Prns/ vitamins     EKG - normal sinus rhythm, 80's no acute  Assessment/ Plan: 1. Presyncope - suspect vol depletion, r/o recurrent UTI. No rhythm abnormalities here. BP's up after saline bolus in ED. Cont IVF's. No cardiac/ resp issues clinically. Was just here for UTI/ pyelo.  Get I/O cath for UA and culture here. Watch on tele for 12 hrs. Hold abx for now.  2. AKI - creat 1.7. Recurrent was here last week w/ similar issues resolved w/ IV abx for infection and IVF's.  3. CKD 3- baseline creat 1.0 w/ eGFR 45- 60 4. Recent UTI / pyelo - complicated w/ known hydronephrotic extrarenal pelvis on the R. Consider urology input if pt has +UCx again. She got total 10d abx (5 IV, + 5 po) for this recent episode.  5. DM - cover w/ SSI for now until we can reach the SNF to get her current  lantus dose (left on 25u bid recent DC, but unable to reach them today) 6. HL - cont   meds 7. HTN - hold coreg, give IVF's, resume BB when needed      Rob Schertz, MD   [facility] 06/27/2019, 1:10 PM        

## 2019-10-29 NOTE — ED Provider Notes (Signed)
[facility] EMERGENCY DEPARTMENT Provider Note   CSN: [id number] Arrival date & time: 10/29/19  1057     History Chief Complaint  Patient presents with  . Weakness    TELESA ABRAHAMSON is a 86 y.o. female.  Patient s/p recent admission with UTI/AKI, presents with generalized weakness via EMS. States this AM was on toilet when felt faint/lightheaded, and went to stand up and felt as if was going to pass out. ?momentary loc. Pt indicates she had to sit back down on toilet, and felt very faint. No room spinning or vertigo. Denies trauma/fall, or injury. No headache. No chest pain or discomfort. No sob. No palpitations. No abd pain or nvd. No blood loss, rectal bleeding or melena. No dysuria or gu c/o. Denies change in meds/new meds.   The history is provided by the patient and the EMS personnel.  Weakness Associated symptoms: no abdominal pain, no chest pain, no cough, no diarrhea, no fever, no headaches, no shortness of breath and no vomiting        Past Medical History:  Diagnosis Date  . Abdominal aortic aneurysm ([place]) 06/30/2014   Recommend follow-up ultrasound in 2018.  Marland Kitchen Anemia   . Arthritis    HANDS  . BCC (basal cell carcinoma of skin) 12/16/2015   Right Ant. Shoulder   . Blood transfusion without reported diagnosis   . Diabetes mellitus   . Generalized and unspecified atherosclerosis   . GERD (gastroesophageal reflux disease)   . Hydronephrosis of right kidney 06/26/2014   Chronic UPJ stenosis  . Hypertension   . Hypomagnesemia 06/23/2014   And hypocalcemia, hypophosphatemia, and hypokalemia.  . Nodular basal cell carcinoma (BCC) 06/27/2017   Behind Left Ear  . Peripheral arterial disease ([place])    history of known right common iliac artery occlusion status post left iliac stenting.  . Pneumonia    hx  . Pure hypercholesterolemia   . Superficial nodular basal cell carcinoma (BCC) 12/16/2015   Left Ant. Neck  . Superficial nodular basal cell carcinoma (BCC) 06/27/2017     Behing Right Ear  . Swallowing difficulty    gets crumbs caught in throat-told may have to have esophagus streched  . Ulcer   . UTI (lower urinary tract infection)    recent tx     Patient Active Problem List   Diagnosis Date Noted  . AKI (acute kidney injury) ([place]) 10/24/2019  . Acute pyelonephritis 10/24/2019  . Acute renal failure superimposed on stage 3b chronic kidney disease ([place]) 10/24/2019  . Hyperkalemia   . Skin infection, left side of neck near carotid endarterectomy scar 11/19/2015  . Hyperlipidemia 12/11/2014  . Abdominal aortic aneurysm ([place]) 06/30/2014  . Normocytic anemia 06/28/2014  . Hypophosphatemia 06/26/2014  . Hydronephrosis of right kidney 06/26/2014  . Renal stones 06/26/2014  . Hypokalemia 06/26/2014  . UTI (urinary tract infection) 06/23/2014  . Hypomagnesemia 06/23/2014  . Hypocalcemia 06/23/2014  . Metabolic acidosis [id number]  . Hypertension 06/22/2014  . DM (diabetes mellitus), type 2 with peripheral vascular complications ([place]) [id number]  . ARF (acute renal failure) ([place]) 06/22/2014  . PVD (peripheral vascular disease) ([place]) 06/22/2014  . GERD (gastroesophageal reflux disease) 06/22/2014  . Pure hypercholesterolemia 06/22/2014  . Nausea vomiting and diarrhea 06/22/2014  . Dysphagia, unspecified(787.20) 11/23/2013  . HTN (hypertension) 06/07/2013  . Carotid artery disease ([place]) 11/30/2012  . Peripheral arterial disease ([place])   . COLONIC POLYPS 08/12/2010  . DM 08/12/2010  . ANEMIA, IRON DEFICIENCY 08/12/2010  . Peripheral vascular  disease ([place]) 08/12/2010    Past Surgical History:  Procedure Laterality Date  . ABDOMINAL HYSTERECTOMY    . ARTERY REPAIR Bilateral    corotid artery surgery  . BLADDER SURGERY     tuck  . CAROTID ENDARTERECTOMY     bilateral CEA 2008; (L) 11/2006, (R) 01/2007 with reperfusion phenomenon (seizure) post-operaitvely  . CHOLECYSTECTOMY    . MULTIPLE EXTRACTIONS WITH ALVEOLOPLASTY N/A 12/07/2013   Procedure:  MULTIPLE EXTRACTION WITH ALVEOLOPLASTY;  Surgeon: Gae Bon, DDS;  Location: [place];  Service: Oral Surgery;  Laterality: N/A;     OB History    Gravida  3   Para  3   Term  3   Preterm      AB      Living        SAB      TAB      Ectopic      Multiple      Live Births              Family History  Problem Relation Age of Onset  . Heart disease Father     Social History   Tobacco Use  . Smoking status: Former Smoker    Packs/day: 1.00    Years: 56.00    Pack years: 56.00    Types: Cigarettes    Quit date: 11/28/2008    Years since quitting: 10.9  . Smokeless tobacco: Never Used  Substance Use Topics  . Alcohol use: No    Comment: quit  . Drug use: No    Home Medications Prior to Admission medications   Medication Sig Start Date End Date Taking? Authorizing Provider  acetaminophen (TYLENOL) 325 MG tablet Take 2 tablets (650 mg total) by mouth every 6 (six) hours as needed for mild pain (or Fever >/= 101). 10/27/19   Arrien, Jimmy Picket, MD  carvedilol (COREG) 25 MG tablet Take 1 tablet (25 mg total) by mouth 2 (two) times daily with a meal. 12/11/14   Lorretta Harp, MD  cephALEXin (KEFLEX) 500 MG capsule Take 1 capsule (500 mg total) by mouth every 12 (twelve) hours for 5 days. 10/27/19 11/01/19  Arrien, Jimmy Picket, MD  cilostazol (PLETAL) 50 MG tablet Take 1 tablet (50 mg total) by mouth 2 (two) times daily. 10/19/19   Lorretta Harp, MD  insulin glargine (LANTUS) 100 UNIT/ML injection Inject 25 Units into the skin 2 (two) times daily.    [provider]  iron polysaccharides (FERREX 150) 150 MG capsule Take 150 mg by mouth 2 (two) times daily.     [provider]  magnesium oxide (MAG-OX) 400 MG tablet Take 1 tablet (400 mg total) by mouth 2 (two) times daily. Patient not taking: Reported on 10/25/2019 06/30/14   Rexene Alberts, MD  omeprazole (PRILOSEC) 20 MG capsule Take 1 capsule (20 mg total) by mouth every morning.  12/11/14   Lorretta Harp, MD  ondansetron (ZOFRAN) 4 MG tablet Take 1 tablet (4 mg total) by mouth every 6 (six) hours as needed for nausea. 10/27/19   Arrien, Jimmy Picket, MD  pravastatin (PRAVACHOL) 80 MG tablet Take 1 tablet (80 mg total) by mouth every morning. 02/25/15   Lorretta Harp, MD  vitamin C (ASCORBIC ACID) 500 MG tablet Take 500 mg by mouth daily.    [provider]    Allergies    Red blood cells, Penicillins, and Sulfonamide derivatives  Review of Systems   Review of Systems  Constitutional: Negative for chills and fever.  HENT: Negative for sore throat.   Eyes: Negative for redness.  Respiratory: Negative for cough and shortness of breath.   Cardiovascular: Negative for chest pain, palpitations and leg swelling.  Gastrointestinal: Negative for abdominal pain, blood in stool, diarrhea and vomiting.  Genitourinary: Negative for flank pain.  Musculoskeletal: Negative for back pain and neck pain.  Skin: Negative for rash.  Neurological: Positive for weakness and light-headedness. Negative for speech difficulty, numbness and headaches.  Hematological: Does not bruise/bleed easily.  Psychiatric/Behavioral: Negative for confusion.    Physical Exam Updated Vital Signs BP (!) 85/61 (BP Location: Right Arm)   Pulse 84   Temp (!) 97.4 F (36.3 C) (Oral)   Resp 19   Ht 1.6 m (5\' 3" )   Wt 58 kg   SpO2 93%   BMI 22.65 kg/m   Physical Exam Vitals and nursing note reviewed.  Constitutional:      Appearance: Normal appearance. She is well-developed.  HENT:     Head: Atraumatic.     Nose: Nose normal.     Mouth/Throat:     Mouth: Mucous membranes are moist.  Eyes:     General: No scleral icterus.    Conjunctiva/sclera: Conjunctivae normal.     Pupils: Pupils are equal, round, and reactive to light.  Neck:     Vascular: No carotid bruit.     Trachea: No tracheal deviation.  Cardiovascular:     Rate and Rhythm: Normal rate and regular rhythm.      Pulses: Normal pulses.     Heart sounds: Normal heart sounds. No murmur. No friction rub. No gallop.   Pulmonary:     Effort: Pulmonary effort is normal. No respiratory distress.     Breath sounds: Normal breath sounds.  Abdominal:     General: Bowel sounds are normal. There is no distension.     Palpations: Abdomen is soft.     Tenderness: There is no abdominal tenderness. There is no guarding.  Genitourinary:    Comments: No cva tenderness.  Musculoskeletal:        General: No swelling or tenderness.     Cervical back: Normal range of motion and neck supple. No rigidity. No muscular tenderness.  Skin:    General: Skin is warm and dry.     Findings: No rash.  Neurological:     Mental Status: She is alert.     Comments: Alert, speech normal. Motor/sens grossly intact bil.   Psychiatric:        Mood and Affect: Mood normal.     ED Results / Procedures / Treatments   Labs (all labs ordered are listed, but only abnormal results are displayed) Results for orders placed or performed during the hospital encounter of 10/29/19  CBC  Result Value Ref Range   WBC 9.3 4.0 - 10.5 K/uL   RBC 3.74 (L) 3.87 - 5.11 MIL/uL   Hemoglobin 11.0 (L) 12.0 - 15.0 g/dL   HCT 35.7 (L) 36.0 - 46.0 %   MCV 95.5 80.0 - 100.0 fL   MCH 29.4 26.0 - 34.0 pg   MCHC 30.8 30.0 - 36.0 g/dL   RDW 12.3 11.5 - 15.5 %   Platelets 205 150 - 400 K/uL   nRBC 0.0 0.0 - 0.2 %  Comprehensive metabolic panel  Result Value Ref Range   Sodium 141 135 - 145 mmol/L   Potassium 3.5 3.5 - 5.1 mmol/L   Chloride 100 98 - 111  mmol/L   CO2 30 22 - 32 mmol/L   Glucose, Bld 138 (H) 70 - 99 mg/dL   BUN 21 8 - 23 mg/dL   Creatinine, Ser 1.71 (H) 0.44 - 1.00 mg/dL   Calcium 8.4 (L) 8.9 - 10.3 mg/dL   Total Protein 5.6 (L) 6.5 - 8.1 g/dL   Albumin 3.3 (L) 3.5 - 5.0 g/dL   AST 20 15 - 41 U/L   ALT 16 0 - 44 U/L   Alkaline Phosphatase 57 38 - 126 U/L   Total Bilirubin 0.7 0.3 - 1.2 mg/dL   GFR calc non Af Amer 27 (L) >60  mL/min   GFR calc Af Amer 31 (L) >60 mL/min   Anion gap 11 5 - 15  Lactic acid, plasma  Result Value Ref Range   Lactic Acid, Venous 1.6 0.5 - 1.9 mmol/L  Troponin I (High Sensitivity)  Result Value Ref Range   Troponin I (High Sensitivity) 34 (H) <18 ng/L  Troponin I (High Sensitivity)  Result Value Ref Range   Troponin I (High Sensitivity) 31 (H) <18 ng/L   CT ABDOMEN PELVIS WO CONTRAST  Result Date: 10/24/2019 CLINICAL DATA:  AAA on x-ray EXAM: CT ABDOMEN AND PELVIS WITHOUT CONTRAST TECHNIQUE: Multidetector CT imaging of the abdomen and pelvis was performed following the standard protocol without IV contrast. COMPARISON:  Same-day lumbar radiograph, CT renal colic [id number] FINDINGS: Lower chest: Bandlike opacities in the lung bases compatible with atelectasis and/or scarring Normal heart size. No pericardial effusion. Calcifications of the coronary arteries and mitral annulus. Hepatobiliary: No focal liver abnormality is seen. Patient is post cholecystectomy. Slight prominence of the biliary tree likely related to reservoir effect. No calcified intraductal gallstones. Pancreas: Unremarkable. No pancreatic ductal dilatation or surrounding inflammatory changes. Spleen: Stable calcified lesion in the spleen, almost certainly benign. Could reflect a partially calcified granuloma versus hemangioma or lymphangioma given a slightly hypoattenuating appearance on comparison imaging. Otherwise normal spleen. Adrenals/Urinary Tract: Normal adrenal glands. Mild bilateral perinephric stranding, left slightly greater than right. Chronically dilated right extrarenal pelvis likely secondary to UPJ obstruction. Bilateral urothelial thickening is noted of the proximal ureters and collecting systems. There are bilateral punctate calcifications likely reflecting either vascular calcium rich tiny collecting system calculi without visible obstructive urolithiasis at this time. No worrisome renal mass. Urinary bladder  appears grossly unremarkable without significant wall thickening or stranding. Stomach/Bowel: Distal esophagus, stomach and duodenal sweep are unremarkable. No small bowel wall thickening or dilatation. No evidence of obstruction. Numerous fecaliths are present within the otherwise normal appendix in the right lower quadrant. There is pancolonic diverticulosis. No convincing evidence of acute diverticulitis. Pelvic floor laxity with possible rectocele. Vascular/Lymphatic: There is extensive severe atherosclerotic calcification throughout the aorta and branch vessels. There is mild lobular fusiform dilatation at the level the celiac access and renal artery origins measuring up to 2.8 cm. Larger bilobed infrarenal abdominal aortic aneurysm measures up to 3.7 cm in size. Increased diameter on the same day lumbar radiographs may be spuriously increased with radiographic technique. Some hypodense mural thrombus is noted with partial calcification. No periaortic stranding or acute findings to suggest threatened rupture. Left iliac artery stenting is noted. Extensive calcification of the inflow and outflow vessels is seen. Reproductive: Patient is post hysterectomy. Radiopaque pessary device noted within the vaginal canal. Other: No abdominopelvic free fluid or air. No bowel containing hernias. Pelvic floor laxity as above. Musculoskeletal: The osseous structures appear diffusely demineralized which may limit detection of small or nondisplaced fractures.  No acute osseous abnormality or suspicious osseous lesion. Levocurvature of the lumbar spine is similar to comparison is. Multilevel degenerative changes are present in the imaged portions of the spine. IMPRESSION: 1. Bilobed infrarenal abdominal aortic aneurysm measures up to 3.7 cm in size. Increased diameter on the same day lumbar radiographs may be spuriously increased with radiographic technique. No periaortic stranding or acute findings to suggest threatened rupture.  Pararenal fusiform dilatation is stable as well. 2. Left iliac artery stenting. Luminal evaluation of the vasculature cannot be assessed in the absence of contrast media. 3. Chronically dilated right extrarenal pelvis likely secondary to UPJ obstruction. Bilateral urothelial thickening is noted of the proximal ureters and collecting systems, nonspecific but can be seen with a ascending urinary tract infection. Correlate with urinalysis. 4. Bilateral punctate calcifications likely reflecting either vascular calcium rich tiny collecting system calculi without visible obstructive urolithiasis at this time. 5. Pancolonic diverticulosis without convincing evidence of acute diverticulitis. 6. Pelvic floor laxity with possible rectocele. Correlate with symptoms. 7. Aortic Atherosclerosis (ICD10-I70.0). Electronically Signed   By: Lovena Le M.D.   On: 10/24/2019 04:04   DG Lumbar Spine Complete  Result Date: 10/24/2019 CLINICAL DATA:  Low back pain, difficulty with ambulation, no known injury EXAM: LUMBAR SPINE - COMPLETE 4+ VIEW COMPARISON:  CT 06/25/2014 FINDINGS: The osseous structures appear diffusely demineralized which may limit detection of small or nondisplaced fractures. Five lumbar type vertebral bodies are visualized. There is levocurvature of the lumbar spine with an apex at the L3 level. Mild stepwise retrolisthesis L2-L4 with grade 1 anterolisthesis L4 on L5. Findings on a likely degenerative basis given advanced facet arthropathic changes throughout the lumbar spine and multilevel disc height loss with vacuum disc and discogenic endplate changes. 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 Additional degenerative changes of the hips and SI joints. Extensive aortoiliac atherosclerosis with a bilobed abdominal aortic aneurysm measuring up to 4.7 cm in diameter, increased from 3.2 cm on comparison CT from 2016. Additional vascular calcium throughout the abdomen and pelvis. Few  phleboliths in the pelvis as well. Normal bowel gas pattern. IMPRESSION: 1. Osseous demineralization, curvature, and advanced degenerative changes may limit detection of subtle fracture. No definite acute osseous injury. 2. Mild stepwise retrolisthesis L2-L4 with grade 1 anterolisthesis L4 on L5. 3. Moderate discogenic and facet degenerative changes throughout the lumbar spine. 4. Enlarging bilobed abdominal aortic aneurysm measuring up to 4.7 cm at this time. Angiographic imaging may be of clinical utility in the setting of atraumatic acute onset low back pain. Critical Value/emergent results were called by telephone at the time of interpretation on 10/24/2019 at 1:37 am to provider [facility] , who verbally acknowledged these results. Electronically Signed   By: Lovena Le M.D.   On: 10/24/2019 01:35   US RENAL  Result Date: 10/24/2019 CLINICAL DATA:  Acute renal failure with chronic kidney disease. EXAM: RENAL / URINARY TRACT ULTRASOUND COMPLETE COMPARISON:  Abdominopelvic CT earlier today. Pararenal ultrasound and abdominal CT 06/23/2014 FINDINGS: Right Kidney: Renal measurements: 9.4 x 5.4 x 5.8 cm = volume: 153 mL. Right hydronephrosis which appears similar to prior imaging. There is mild thinning of the right renal parenchyma with borderline increased renal echogenicity. No evidence of focal mass. No renal stones demonstrated by ultrasound. Left Kidney: Renal measurements: 10.7 x 5.4 x 5.3 cm = volume: 169 mL. No hydronephrosis. Normal renal echogenicity. No evidence of focal renal mass. No renal calculi demonstrated sonographically. Bladder: Decompressed by Foley catheter not well evaluated. Other:  None. IMPRESSION: 1. Chronic right hydronephrosis which may be related to UPJ obstruction. This is unchanged dating back to 2016. There is thinning of the right renal parenchyma. 2. No left hydronephrosis. Electronically Signed   By: Keith Rake M.D.   On: 10/24/2019 14:25   DG Chest Port 1  View  Result Date: 10/29/2019 CLINICAL DATA:  Shortness of breath EXAM: PORTABLE CHEST 1 VIEW COMPARISON:  06/26/2014 FINDINGS: The heart size and mediastinal contours are within normal limits. Atherosclerotic calcification of the aortic knob. Mild hyperexpanded lungs. Chronic mild interstitial prominence. Linear atelectasis in the left lung base. No focal airspace consolidation, pleural effusion, or pneumothorax. The visualized skeletal structures are unremarkable. IMPRESSION: Chronic findings of COPD. No superimposed acute cardiopulmonary process. Electronically Signed   By: Davina Poke D.O.   On: 10/29/2019 11:25    EKG EKG Interpretation  Date/Time:  Monday Oct 29 2019 11:04:27 EDT Ventricular Rate:  79 PR Interval:    QRS Duration: 86 QT Interval:  394 QTC Calculation: 452 R Axis:   -19 Text Interpretation: Sinus rhythm Ventricular premature complex Low voltage, precordial leads Nonspecific T wave abnormality Confirmed by Lajean Saver [phone]) on 10/29/2019 11:14:16 AM   Radiology DG Chest Port 1 View  Result Date: 10/29/2019 CLINICAL DATA:  Shortness of breath EXAM: PORTABLE CHEST 1 VIEW COMPARISON:  06/26/2014 FINDINGS: The heart size and mediastinal contours are within normal limits. Atherosclerotic calcification of the aortic knob. Mild hyperexpanded lungs. Chronic mild interstitial prominence. Linear atelectasis in the left lung base. No focal airspace consolidation, pleural effusion, or pneumothorax. The visualized skeletal structures are unremarkable. IMPRESSION: Chronic findings of COPD. No superimposed acute cardiopulmonary process. Electronically Signed   By: Davina Poke D.O.   On: 10/29/2019 11:25    Procedures Procedures (including critical care time)  Medications Ordered in ED Medications  sodium chloride 0.9 % bolus 1,000 mL (1,000 mLs Intravenous New Bag/Given 10/29/19 1138)    ED Course  I have reviewed the triage vital signs and the nursing  notes.  Pertinent labs & imaging results that were available during my care of the patient were reviewed by me and considered in my medical decision making (see chart for details).    MDM Rules/Calculators/A&P                      Initial bp low. Ns bolus. Lactate and additional labs ordered. Ecg. Continuous pulse ox and monitor. Stat labs.  MDM Number of Diagnoses or Management Options   Amount and/or Complexity of Data Reviewed Clinical lab tests: ordered and reviewed Tests in the radiology section of CPT: ordered and reviewed Tests in the medicine section of CPT: ordered and reviewed Discussion of test results with the performing providers: yes Decide to obtain previous medical records or to obtain history from someone other than the patient: yes Obtain history from someone other than the patient: yes Review and summarize past medical records: yes Discuss the patient with other providers: yes Independent visualization of images, tracings, or specimens: yes  Risk of Complications, Morbidity, and/or Mortality Presenting problems: high Diagnostic procedures: high Management options: high    Reviewed nursing notes and prior charts for additional history.   Initial labs reviewed/interpreted by me - lactate normal. Wbc normal. hbg 11 c/w prior.   Po fluids, food.  Additional labs reviewed/interpreted by me - trop mildly elev. No current cp or discomfort. AKI on labs compared to recent prior. Ns bolus.   Will consult hospitalists for admission  re dehydration/volume depletion, AKI, hypotension.   CRITICAL CARE RE: Hypotension, dehydration, AKI.  Performed by: Mirna Mires Total critical care time: 35 minutes Critical care time was exclusive of separately billable procedures and treating other patients. Critical care was necessary to treat or prevent imminent or life-threatening deterioration. Critical care was time spent personally by me on the following activities:  development of treatment plan with patient and/or surrogate as well as nursing, discussions with consultants, evaluation of patient's response to treatment, examination of patient, obtaining history from patient or surrogate, ordering and performing treatments and interventions, ordering and review of laboratory studies, ordering and review of radiographic studies, pulse oximetry and re-evaluation of patient's condition.    Final Clinical Impression(s) / ED Diagnoses Final diagnoses:  None    Rx / DC Orders ED Discharge Orders    None       Lajean Saver, MD 10/29/19 1440

## 2019-10-29 NOTE — ED Triage Notes (Signed)
Pt brought to ED via [facility] from [facility] for generalized weakness. Per staff, pt felt weak with low O2 sat. O2 sat on RA with EMS 85%. Pt states she was getting off the toilet when she felt weak.

## 2019-10-29 NOTE — Plan of Care (Signed)
Supporting Information:   [employer] GUIDELINES FOR AKI - 2012: - Increase in serum creatinine by >0.3 mg/dL (>26.5 micromol/L) within 48 hours, or - Increase in serum creatinine to >1.5 times baseline, which is known or presumed to   have occurred within the prior seven days, or - Urine volume <0.5 mL/kg/hour for six hours   Please exercise your independent, professional judgment when responding.  A specific answer is not anticipated or expected.  Thank You,  [place]

## 2019-10-30 ENCOUNTER — Other Ambulatory Visit (HOSPITAL_COMMUNITY): Payer: Self-pay | Admitting: *Deleted

## 2019-10-30 ENCOUNTER — Observation Stay (HOSPITAL_COMMUNITY): Payer: Medicare Other

## 2019-10-30 DIAGNOSIS — I361 Nonrheumatic tricuspid (valve) insufficiency: Secondary | ICD-10-CM

## 2019-10-30 DIAGNOSIS — B963 Hemophilus influenzae [H. influenzae] as the cause of diseases classified elsewhere: Secondary | ICD-10-CM | POA: Diagnosis present

## 2019-10-30 DIAGNOSIS — E860 Dehydration: Secondary | ICD-10-CM | POA: Diagnosis present

## 2019-10-30 DIAGNOSIS — I739 Peripheral vascular disease, unspecified: Secondary | ICD-10-CM | POA: Diagnosis not present

## 2019-10-30 DIAGNOSIS — R55 Syncope and collapse: Secondary | ICD-10-CM

## 2019-10-30 DIAGNOSIS — I429 Cardiomyopathy, unspecified: Secondary | ICD-10-CM | POA: Diagnosis present

## 2019-10-30 DIAGNOSIS — R531 Weakness: Secondary | ICD-10-CM | POA: Diagnosis not present

## 2019-10-30 DIAGNOSIS — Z85828 Personal history of other malignant neoplasm of skin: Secondary | ICD-10-CM | POA: Diagnosis not present

## 2019-10-30 DIAGNOSIS — N179 Acute kidney failure, unspecified: Secondary | ICD-10-CM | POA: Diagnosis present

## 2019-10-30 DIAGNOSIS — I493 Ventricular premature depolarization: Secondary | ICD-10-CM | POA: Diagnosis present

## 2019-10-30 DIAGNOSIS — I129 Hypertensive chronic kidney disease with stage 1 through stage 4 chronic kidney disease, or unspecified chronic kidney disease: Secondary | ICD-10-CM | POA: Diagnosis present

## 2019-10-30 DIAGNOSIS — E876 Hypokalemia: Secondary | ICD-10-CM | POA: Diagnosis present

## 2019-10-30 DIAGNOSIS — N136 Pyonephrosis: Secondary | ICD-10-CM | POA: Diagnosis present

## 2019-10-30 DIAGNOSIS — Z882 Allergy status to sulfonamides status: Secondary | ICD-10-CM | POA: Diagnosis not present

## 2019-10-30 DIAGNOSIS — I959 Hypotension, unspecified: Secondary | ICD-10-CM | POA: Diagnosis present

## 2019-10-30 DIAGNOSIS — N1832 Chronic kidney disease, stage 3b: Secondary | ICD-10-CM | POA: Diagnosis present

## 2019-10-30 DIAGNOSIS — I340 Nonrheumatic mitral (valve) insufficiency: Secondary | ICD-10-CM | POA: Diagnosis not present

## 2019-10-30 DIAGNOSIS — N183 Chronic kidney disease, stage 3 unspecified: Secondary | ICD-10-CM | POA: Diagnosis present

## 2019-10-30 DIAGNOSIS — K219 Gastro-esophageal reflux disease without esophagitis: Secondary | ICD-10-CM | POA: Diagnosis present

## 2019-10-30 DIAGNOSIS — E1151 Type 2 diabetes mellitus with diabetic peripheral angiopathy without gangrene: Secondary | ICD-10-CM | POA: Diagnosis present

## 2019-10-30 DIAGNOSIS — M545 Low back pain: Secondary | ICD-10-CM | POA: Diagnosis present

## 2019-10-30 DIAGNOSIS — Z880 Allergy status to penicillin: Secondary | ICD-10-CM | POA: Diagnosis not present

## 2019-10-30 DIAGNOSIS — M79604 Pain in right leg: Secondary | ICD-10-CM | POA: Diagnosis present

## 2019-10-30 DIAGNOSIS — E1122 Type 2 diabetes mellitus with diabetic chronic kidney disease: Secondary | ICD-10-CM | POA: Diagnosis present

## 2019-10-30 DIAGNOSIS — I9589 Other hypotension: Secondary | ICD-10-CM | POA: Diagnosis not present

## 2019-10-30 DIAGNOSIS — Z1611 Resistance to penicillins: Secondary | ICD-10-CM | POA: Diagnosis present

## 2019-10-30 DIAGNOSIS — E785 Hyperlipidemia, unspecified: Secondary | ICD-10-CM | POA: Diagnosis present

## 2019-10-30 DIAGNOSIS — Z20822 Contact with and (suspected) exposure to covid-19: Secondary | ICD-10-CM | POA: Diagnosis present

## 2019-10-30 DIAGNOSIS — E7800 Pure hypercholesterolemia, unspecified: Secondary | ICD-10-CM | POA: Diagnosis present

## 2019-10-30 LAB — ECHOCARDIOGRAM COMPLETE
Height: 63 in
Weight: 2218.71 oz

## 2019-10-30 LAB — BASIC METABOLIC PANEL
Anion gap: 11 (ref 5–15)
BUN: 14 mg/dL (ref 8–23)
CO2: 29 mmol/L (ref 22–32)
Calcium: 7.8 mg/dL — ABNORMAL LOW (ref 8.9–10.3)
Chloride: 99 mmol/L (ref 98–111)
Creatinine, Ser: 0.88 mg/dL (ref 0.44–1.00)
GFR calc Af Amer: >60 mL/min (ref >60)
GFR calc non Af Amer: >60 mL/min (ref >60)
Glucose, Bld: 93 mg/dL (ref 70–99)
Potassium: 2.9 mmol/L — ABNORMAL LOW (ref 3.5–5.1)
Sodium: 139 mmol/L (ref 135–145)

## 2019-10-30 LAB — CBC
HCT: 34 % — ABNORMAL LOW (ref 36.0–46.0)
Hemoglobin: 10.8 g/dL — ABNORMAL LOW (ref 12.0–15.0)
MCH: 30.4 pg (ref 26.0–34.0)
MCHC: 31.8 g/dL (ref 30.0–36.0)
MCV: 95.8 fL (ref 80.0–100.0)
Platelets: 202 10*3/uL (ref 150–400)
RBC: 3.55 MIL/uL — ABNORMAL LOW (ref 3.87–5.11)
RDW: 12.3 % (ref 11.5–15.5)
WBC: 10 10*3/uL (ref 4.0–10.5)
nRBC: 0 % (ref 0.0–0.2)

## 2019-10-30 LAB — GLUCOSE, CAPILLARY
Glucose-Capillary: 84 mg/dL (ref 70–99)
Glucose-Capillary: 112 mg/dL — ABNORMAL HIGH (ref 70–99)
Glucose-Capillary: 100 mg/dL — ABNORMAL HIGH (ref 70–99)
Glucose-Capillary: 125 mg/dL — ABNORMAL HIGH (ref 70–99)

## 2019-10-30 LAB — MAGNESIUM: Magnesium: 1.2 mg/dL — ABNORMAL LOW (ref 1.7–2.4)

## 2019-10-30 MED ORDER — LACTATED RINGERS IV SOLN: INTRAVENOUS | Status: AC

## 2019-10-30 MED ORDER — PRAVASTATIN SODIUM 40 MG PO TABS
80.0000 mg | ORAL_TABLET | Freq: Every morning | ORAL | Status: DC
  Administered 2019-10-30 – 2019-10-31 (×2): 80 mg via ORAL
  Filled 2019-10-30 (×2): qty 2

## 2019-10-30 MED ORDER — CEFDINIR 300 MG PO CAPS
300.0000 mg | ORAL_CAPSULE | Freq: Two times a day (BID) | ORAL | Status: DC
  Administered 2019-10-30 – 2019-10-31 (×3): 300 mg via ORAL
  Filled 2019-10-30 (×3): qty 1

## 2019-10-30 MED ORDER — ASCORBIC ACID 500 MG PO TABS
500.0000 mg | ORAL_TABLET | Freq: Every day | ORAL | Status: DC
  Administered 2019-10-30 – 2019-10-31 (×2): 500 mg via ORAL
  Filled 2019-10-30 (×2): qty 1

## 2019-10-30 MED ORDER — ENOXAPARIN SODIUM 40 MG/0.4ML ~~LOC~~ SOLN
40.0000 mg | SUBCUTANEOUS | Status: DC
  Administered 2019-10-30: 40 mg via SUBCUTANEOUS
  Filled 2019-10-30: qty 0.4

## 2019-10-30 MED ORDER — CILOSTAZOL 100 MG PO TABS
50.0000 mg | ORAL_TABLET | Freq: Two times a day (BID) | ORAL | Status: DC
  Administered 2019-10-30 – 2019-10-31 (×3): 50 mg via ORAL
  Filled 2019-10-30 (×3): qty 1

## 2019-10-30 MED ORDER — PANTOPRAZOLE SODIUM 40 MG PO TBEC
40.0000 mg | DELAYED_RELEASE_TABLET | Freq: Every day | ORAL | Status: DC
  Administered 2019-10-30 – 2019-10-31 (×2): 40 mg via ORAL
  Filled 2019-10-30 (×3): qty 1

## 2019-10-30 NOTE — Plan of Care (Signed)
  Problem: Acute Rehab PT Goals(only PT should resolve) Goal: Pt Will Go Supine/Side To Sit Outcome: Progressing Flowsheets (Taken 10/30/2019 1102) Pt will go Supine/Side to Sit: with minimal assist Goal: Pt Will Go Sit To Supine/Side Outcome: Progressing Flowsheets (Taken 10/30/2019 1102) Pt will go Sit to Supine/Side: with minimal assist Goal: Patient Will Transfer Sit To/From Stand Outcome: Progressing Flowsheets (Taken 10/30/2019 1102) Patient will transfer sit to/from stand: with min guard assist Goal: Pt Will Transfer Bed To Chair/Chair To Bed Outcome: Progressing Flowsheets (Taken 10/30/2019 1102) Pt will Transfer Bed to Chair/Chair to Bed: min guard assist Goal: Pt Will Ambulate Outcome: Progressing Flowsheets (Taken 10/30/2019 1102) Pt will Ambulate:  50 feet  with min guard assist  with rolling walker   Tori Artina Minella PT, DPT 10/30/19, 11:03 AM [phone]

## 2019-10-30 NOTE — Evaluation (Signed)
Physical Therapy Evaluation Patient Details Name: Ariana Shea MRN: [id number] DOB: 03/14/1934 Today's Date: 10/30/2019   History of Present Illness  86 y.o. female with medical history of hypertension, diabetes mellitus, peripheral vascular disease, abdominal aortic aneurysm, hyperlipidemia, chronic dilated right extrarenal pelvis/obstructive uropathy presenting a near syncopal episode from [place].  Patient was recently mid to the hospital from 10/23/2019 to 10/27/2019 wherein she was treated for pyelonephritis and acute on chronic renal failure.  The patient was discharged to [facility].  She was on the commode on 10/29/2019 when she began feeling dizzy.  Clinical Impression  Pt admitted with above diagnosis. Pt requires mod assist to get to EOB. Pt requires min assist to rise from EOB and to transfer to [facility] for bowel movement; pt reliant on RW for steadying in standing so pericare provided by therapist. Pt limited to steps at bedside due to fatigue, but denies dizziness or pain with ambulation. Pt tolerates remaining up in chair at EOS with call bell in lap and encouraged to call if needing assistance. Pt currently with functional limitations due to the deficits listed below (see PT Problem List). Pt will benefit from skilled PT to increase their independence and safety with mobility to allow discharge to the venue listed below.       Follow Up Recommendations SNF    Equipment Recommendations  None recommended by PT    Recommendations for Other Services       Precautions / Restrictions Precautions Precautions: Fall Restrictions Weight Bearing Restrictions: No      Mobility  Bed Mobility Overal bed mobility: Needs Assistance Bed Mobility: Supine to Sit  Supine to sit: Mod assist;HOB elevated  General bed mobility comments: mod assist to upright trunk, elevated HOB and use of bedrail to assist in pulling self to EOB  Transfers Overall transfer level: Needs assistance Equipment used:  Rolling walker (2 wheeled) Transfers: Sit to/from [facility] Sit to Stand: Min assist Stand pivot transfers: Min assist  General transfer comment: min assist to rise from surface, BUE assisting to power up, assist to steady with RW  Ambulation/Gait Ambulation/Gait assistance: Min assist Gait Distance (Feet): 5 Feet Assistive device: Rolling walker (2 wheeled) Gait Pattern/deviations: Decreased stride length;Shuffle Gait velocity: decreased   General Gait Details: limited to short, slow steps at bedside with RW, denies dizziness or pain with gait and limited by weakness  Stairs            Wheelchair Mobility    Modified Rankin (Stroke Patients Only)       Balance Overall balance assessment: Needs assistance Sitting-balance support: Feet supported;No upper extremity supported Sitting balance-Leahy Scale: Good Sitting balance - Comments: seated EOB   Standing balance support: During functional activity;Bilateral upper extremity supported Standing balance-Leahy Scale: Poor Standing balance comment: reliant on RW for steadying                Pertinent Vitals/Pain Pain Assessment: Faces Faces Pain Scale: Hurts a little bit Pain Location: neck Pain Descriptors / Indicators: Aching Pain Intervention(s): Limited activity within patient's tolerance;Monitored during session;Repositioned    Home Living Family/patient expects to be discharged to:: Skilled nursing facility          Home Equipment: Walker - 4 wheels      Prior Function Level of Independence: Independent with assistive device(s)         Comments: household ambulator using Rollator prior to last hospitalization, currently requiring mod-min assist with mobility     Hand Dominance  Dominant Hand: Right    Extremity/Trunk Assessment   Upper Extremity Assessment Upper Extremity Assessment: Overall WFL for tasks assessed    Lower Extremity Assessment Lower Extremity  Assessment: Overall WFL for tasks assessed(BLE AROM WNL, strength grossly 3+/5)    Cervical / Trunk Assessment Cervical / Trunk Assessment: Normal  Communication   Communication: No difficulties  Cognition Arousal/Alertness: Awake/alert Behavior During Therapy: WFL for tasks assessed/performed Overall Cognitive Status: Within Functional Limits for tasks assessed             General Comments      Exercises     Assessment/Plan    PT Assessment Patient needs continued PT services  PT Problem List Decreased strength;Decreased activity tolerance;Decreased balance;Decreased mobility       PT Treatment Interventions DME instruction;Gait training;Functional mobility training;Therapeutic activities;Therapeutic exercise;Balance training;Neuromuscular re-education;Patient/family education    PT Goals (Current goals can be found in the Care Plan section)  Acute Rehab PT Goals Patient Stated Goal: rehab then home PT Goal Formulation: With patient Time For Goal Achievement: 11/13/19 Potential to Achieve Goals: Good    Frequency Min 2X/week   Barriers to discharge        Co-evaluation               AM-PAC PT "6 Clicks" Mobility  Outcome Measure Help needed turning from your back to your side while in a flat bed without using bedrails?: A Little Help needed moving from lying on your back to sitting on the side of a flat bed without using bedrails?: A Lot Help needed moving to and from a bed to a chair (including a wheelchair)?: A Little Help needed standing up from a chair using your arms (e.g., wheelchair or bedside chair)?: A Little Help needed to walk in hospital room?: A Lot Help needed climbing 3-5 steps with a railing? : A Lot 6 Click Score: 15    End of Session   Activity Tolerance: Patient tolerated treatment well;Patient limited by fatigue Patient left: in chair;with call bell/phone within reach Nurse Communication: Mobility status PT Visit Diagnosis:  Unsteadiness on feet (R26.81);Other abnormalities of gait and mobility (R26.89)    Time: [id number] PT Time Calculation (min) (ACUTE ONLY): 21 min   Charges:   PT Evaluation $PT Eval Moderate Complexity: 1 Mod           Tori Jensen [place] PT, DPT 10/30/19, 11:02 AM

## 2019-10-30 NOTE — NC FL2 (Signed)
[place] MEDICAID FL2 LEVEL OF CARE SCREENING TOOL     IDENTIFICATION  Patient Name: Ariana Shea Birthdate: 1934/01/15 Sex: female Admission Date (Current Location): 10/29/2019  [place] and [place] Number:  [place] [id number] [place] and Address:  [facility] [address] [place]      Provider Number: [id number]  Attending Physician Name and Address:  Orson Eva, MD  Relative Name and Phone Number:       Current Level of Care: Hospital Recommended Level of Care: [place] Prior Approval Number:    Date Approved/Denied:   PASRR Number:    Discharge Plan: SNF    Current Diagnoses: Patient Active Problem List   Diagnosis Date Noted  . Syncope, vasovagal 10/30/2019  . Acute renal failure superimposed on stage 3 chronic kidney disease ([place]) 10/30/2019  . Dehydration   . Generalized weakness   . Hypotension due to hypovolemia   . AKI (acute kidney injury) ([place]) 10/24/2019  . Acute pyelonephritis 10/24/2019  . Acute renal failure superimposed on stage 3b chronic kidney disease ([place]) 10/24/2019  . Hyperkalemia   . Skin infection, left side of neck near carotid endarterectomy scar 11/19/2015  . Hyperlipidemia 12/11/2014  . Abdominal aortic aneurysm ([place]) 06/30/2014  . Normocytic anemia 06/28/2014  . Hypophosphatemia 06/26/2014  . Hydronephrosis of right kidney 06/26/2014  . Renal stones 06/26/2014  . Hypokalemia 06/26/2014  . UTI (urinary tract infection) 06/23/2014  . Hypomagnesemia 06/23/2014  . Hypocalcemia 06/23/2014  . Metabolic acidosis [id number]  . Hypertension 06/22/2014  . DM (diabetes mellitus), type 2 with peripheral vascular complications ([place]) [id number]  . ARF (acute renal failure) ([place]) 06/22/2014  . PVD (peripheral vascular disease) ([place]) 06/22/2014  . GERD (gastroesophageal reflux disease) 06/22/2014  . Pure hypercholesterolemia 06/22/2014  . Nausea vomiting and diarrhea 06/22/2014  . Dysphagia,  unspecified(787.20) 11/23/2013  . HTN (hypertension) 06/07/2013  . Carotid artery disease ([place]) 11/30/2012  . Peripheral arterial disease ([place])   . COLONIC POLYPS 08/12/2010  . DM 08/12/2010  . ANEMIA, IRON DEFICIENCY 08/12/2010  . Peripheral vascular disease ([place]) 08/12/2010    Orientation RESPIRATION BLADDER Height & Weight     Self, Situation, Time, Place  Normal External catheter Weight: 138 lb 10.7 oz (62.9 kg) Height:  5\' 3"  (160 cm)  BEHAVIORAL SYMPTOMS/MOOD NEUROLOGICAL BOWEL NUTRITION STATUS      Continent Diet(carb modified- see d/c summary)  AMBULATORY STATUS COMMUNICATION OF NEEDS Skin   Limited Assist Verbally Normal                       Personal Care Assistance Level of Assistance  Bathing, Feeding, Dressing Bathing Assistance: Limited assistance Feeding assistance: Limited assistance Dressing Assistance: Limited assistance     Functional Limitations Info    Sight Info: Impaired Hearing Info: Adequate Speech Info: Adequate    SPECIAL CARE FACTORS FREQUENCY  PT (By licensed PT)     PT Frequency: daily              Contractures      Additional Factors Info  Code Status, Allergies Code Status Info: Full code Allergies Info: Penicillins, Sulfonamide Derivatives, no red blood cells (Pt is Jehovah's Witness)           Current Medications (10/30/2019):  This is the current hospital active medication list Current Facility-Administered Medications  Medication Dose Route Frequency Provider Last Rate Last Admin  . acetaminophen (TYLENOL) tablet 650 mg  650 mg Oral Q6H PRN Roney Jaffe,  MD   650 mg at 10/30/19 [id number]   Or  . acetaminophen (TYLENOL) suppository 650 mg  650 mg Rectal Q6H PRN Roney Jaffe, MD      . ascorbic acid (VITAMIN C) tablet 500 mg  500 mg Oral Daily Roney Jaffe, MD   500 mg at 10/30/19 0916  . cefdinir (OMNICEF) capsule 300 mg  300 mg Oral Therisa Doyne, MD   300 mg at 10/30/19 0915  . cilostazol (PLETAL) tablet 50  mg  50 mg Oral BID Roney Jaffe, MD   50 mg at 10/30/19 0916  . enoxaparin (LOVENOX) injection 40 mg  40 mg Subcutaneous Q24H Tat, David, MD      . insulin aspart (novoLOG) injection 0-15 Units  0-15 Units Subcutaneous TID WC Roney Jaffe, MD      . insulin aspart (novoLOG) injection 0-5 Units  0-5 Units Subcutaneous QHS Roney Jaffe, MD      . lactated ringers infusion   Intravenous Continuous Orson Eva, MD 75 mL/hr at 10/30/19 0919 New Bag at 10/30/19 0919  . multivitamin with minerals tablet 1 tablet  1 tablet Oral Daily Roney Jaffe, MD   1 tablet at 10/30/19 0915  . pantoprazole (PROTONIX) EC tablet 40 mg  40 mg Oral Daily Roney Jaffe, MD   40 mg at 10/30/19 0915  . polyethylene glycol (MIRALAX / GLYCOLAX) packet 17 g  17 g Oral Daily PRN Roney Jaffe, MD      . pravastatin (PRAVACHOL) tablet 80 mg  80 mg Oral q morning - 10a Roney Jaffe, MD   80 mg at 10/30/19 0915     Discharge Medications: Please see discharge summary for a list of discharge medications.  Relevant Imaging Results:  Relevant Lab Results:   Additional Information No red blood cells (pt is Jehovah's Witness)  Elaina Pattee, Letta Pate, LCSW

## 2019-10-30 NOTE — Progress Notes (Signed)
PROGRESS NOTE  Ariana Shea [id number] DOB: 08-Jun-1934 DOA: 10/29/2019 PCP: Caprice Renshaw, MD  Brief History:   86 y.o. female with medical history of hypertension, diabetes mellitus, peripheral vascular disease, abdominal aortic aneurysm, hyperlipidemia, chronic dilated right extrarenal pelvis/obstructive uropathy presenting a near syncopal episode from [place].  Patient was recently mid to the hospital from 10/23/2019 to 10/27/2019 wherein she was treated for pyelonephritis and acute on chronic renal failure.  The patient was discharged to [facility].  She was on the commode on 10/29/2019 when she began feeling dizzy.  She described it as her head " feeling swimmy".  The patient had just urinated when she began having the symptoms and put her head between her legs.  Apparently, the nursing aides try to get her up when she felt even worse and stated she had difficulty raising her head up and subsequently sat back down.  She denied a total loss of consciousness.  She was awake the whole time.  The patient denied any prodromal symptoms including fevers, chills, visual disturbance, headache, chest pain, shortness of breath.  The patient denies any recent fevers, chills, coughing, hemoptysis, nausea, vomiting, diarrhea, dysuria, hematuria, hematochezia, melena. In the emergency department, the patient was afebrile hemodynamically stable with oxygen saturation 96% on 2 L.  BMP showed a serum creatinine of 1.71, potassium 3.5, sodium 141, WBC 9.3, hemoglobin 9.0.  Lactic acid was 1.6.  Troponins were unremarkable 34>>> 31.  EKG shows sinus rhythm with nonspecific T wave changes.  Chest x-ray showed chronic interstitial prominence.  Patient was admitted for further evaluation and treatment.  Assessment/Plan: Syncope/near syncope -Suspect volume depletion and vasovagal etiology -Orthostatic vital sign -Once echocardiogram -Continue IV fluids -Remain on telemetry--personally reviewed--sinus tachycardia  with PVCs and PACs  Acute on chronic renal failure--CKD stage IIIb -Secondary to volume depletion -Continue IV fluids  Hypokalemia -Replete -Check magnesium  Diabetes mellitus type 2 -10/24/2019 hemoglobin A1c 6.2 -Allow liberal glycemic control at this point -novolog sliding scale  Essential hypertension -Restart carvedilol at lower dose  Peripheral vascular disease -Continue cilostazol -The patient has chronic right leg pain over 1 year  Pyelonephritis -Restart oral antibiotics to finish 10-day course  Hyperlipidemia -restart statin       Disposition Plan: Patient From:SNF D/C Place:SNF 5/12 if stable Barriers: Not Clinically Stable--continued electrolyte derangement with dehydration and poor po intake  Family Communication:  no Family at bedside  Consultants:  none  Code Status:  FULL  DVT Prophylaxis:  [place] Lovenox   Procedures: As Listed in Progress Note Above  Antibiotics: cefidinr   Total time spent 35 minutes.  Greater than 50% spent face to face counseling and coordinating care.    Subjective: Patient complains of generalized weakness.  She denies any fevers, chills, headache, chest pain, heart breath, nausea, vomiting, direct abdominal pain.  She has no appetite.  She denies any hematochezia, melena, dysuria, hematuria.  Objective: Vitals:   10/29/19 1709 10/29/19 2112 10/30/19 0114 10/30/19 0537  BP: (!) 144/76 (!) 164/83 (!) 157/84 (!) 143/89  Pulse: 82 87 97 100  Resp: 18 20 20 20   Temp: 97.7 F (36.5 C) 98.2 F (36.8 C) 98 F (36.7 C) 98.3 F (36.8 C)  TempSrc: Oral Oral Oral Oral  SpO2: 97% 99% 96% 98%  Weight:    62.9 kg  Height:        Intake/Output Summary (Last 24 hours) at 10/30/2019 [id number] Last data filed at 10/30/2019 0530 Gross  per 24 hour  Intake 1919.45 ml  Output 1400 ml  Net 519.45 ml   Weight change:  Exam:   General:  Pt is alert, follows commands appropriately, not in acute distress  HEENT: No icterus, No  thrush, No neck mass, [place]/AT  Cardiovascular: RRR, S1/S2, no rubs, no gallops; PACs and PVCs  Respiratory:  diminished breath sounds bilateral.  Bibasilar rales but no wheezing.  Abdomen: Soft/+BS, non tender, non distended, no guarding  Extremities: No edema, No lymphangitis, No petechiae, No rashes, no synovitis   Data Reviewed: I have personally reviewed following labs and imaging studies Basic Metabolic Panel: Recent Labs  Lab 10/24/19 0936 10/24/19 1323 10/25/19 0512 10/26/19 0504 10/27/19 0610 10/29/19 1141 10/30/19 0507  NA 136   < > 143 150* 144 141 139  K 6.2*   < > 3.5 3.9 3.0* 3.5 2.9*  CL 115*   < > 106 107 101 100 99  CO2 13*   < > 26 24 30 30 29   GLUCOSE 56*   < > 90 150* 129* 138* 93  BUN 110*   < > 76* 41* 22 21 14   CREATININE 2.22*   < > 1.28* 1.04* 0.87 1.71* 0.88  CALCIUM 9.1   < > 8.1* 8.3* 8.4* 8.4* 7.8*  MG 1.8  --   --   --   --   --   --   PHOS  --   --   --  2.3*  --   --   --    < > = values in this interval not displayed.   Liver Function Tests: Recent Labs  Lab 10/26/19 0504 10/29/19 1141  AST  --  20  ALT  --  16  ALKPHOS  --  57  BILITOT  --  0.7  PROT  --  5.6*  ALBUMIN 3.5 3.3*   No results for input(s): LIPASE, AMYLASE in the last 168 hours. No results for input(s): AMMONIA in the last 168 hours. Coagulation Profile: No results for input(s): INR, PROTIME in the last 168 hours. CBC: Recent Labs  Lab 10/24/19 0138 10/25/19 0512 10/26/19 0504 10/29/19 1141 10/30/19 0507  WBC 10.3 7.2 8.1 9.3 10.0  HGB 13.0 10.6* 11.1* 11.0* 10.8*  HCT 41.5 32.3* 35.5* 35.7* 34.0*  MCV 97.0 92.3 97.3 95.5 95.8  PLT 252 233 248 205 202   Cardiac Enzymes: No results for input(s): CKTOTAL, CKMB, CKMBINDEX, TROPONINI in the last 168 hours. BNP: Invalid input(s): POCBNP CBG: Recent Labs  Lab 10/27/19 0734 10/27/19 1138 10/29/19 1735 10/29/19 2113 10/30/19 0751  GLUCAP 120* 150* 121* 140* 84   HbA1C: No results for input(s): HGBA1C  in the last 72 hours. Urine analysis:    Component Value Date/Time   COLORURINE YELLOW 10/24/2019 0015   APPEARANCEUR CLOUDY (A) 10/24/2019 0015   LABSPEC 1.013 10/24/2019 0015   PHURINE 5.0 10/24/2019 0015   GLUCOSEU NEGATIVE 10/24/2019 0015   HGBUR NEGATIVE 10/24/2019 0015   BILIRUBINUR NEGATIVE 10/24/2019 0015   KETONESUR 5 (A) 10/24/2019 0015   PROTEINUR 30 (A) 10/24/2019 0015   UROBILINOGEN 0.2 06/22/2014 1305   NITRITE NEGATIVE 10/24/2019 0015   LEUKOCYTESUR LARGE (A) 10/24/2019 0015   Sepsis Labs: @LABRCNTIP (procalcitonin:4,lacticidven:4) ) Recent Results (from the past 240 hour(s))  Urine Culture     Status: Abnormal   Collection Time: 10/24/19  1:14 AM   Specimen: Urine, Clean Catch  Result Value Ref Range Status   Specimen Description   Final  URINE, CLEAN CATCH Performed at [facility], [address] [place], [place] [address]    Special Requests   Final    NONE Performed at [facility], [address], [place], [place] [address]    Culture >=100,000 COLONIES/mL KLEBSIELLA PNEUMONIAE (A)  Final   Report Status 10/26/2019 FINAL  Final   Organism ID, Bacteria KLEBSIELLA PNEUMONIAE (A)  Final      Susceptibility   Klebsiella pneumoniae - MIC*    AMPICILLIN >=32 RESISTANT Resistant     CEFAZOLIN <=4 SENSITIVE Sensitive     CEFTRIAXONE <=1 SENSITIVE Sensitive     CIPROFLOXACIN <=0.25 SENSITIVE Sensitive     GENTAMICIN <=1 SENSITIVE Sensitive     IMIPENEM 1 SENSITIVE Sensitive     NITROFURANTOIN 64 INTERMEDIATE Intermediate     TRIMETH/SULFA <=20 SENSITIVE Sensitive     AMPICILLIN/SULBACTAM 4 SENSITIVE Sensitive     PIP/TAZO <=4 SENSITIVE Sensitive     * >=100,000 COLONIES/mL KLEBSIELLA PNEUMONIAE  Respiratory Panel by RT PCR (Flu A&B, Covid) - Nasopharyngeal Swab     Status: None   Collection Time: 10/24/19  3:58 AM   Specimen: Nasopharyngeal Swab  Result Value Ref Range Status   SARS Coronavirus 2 by RT PCR NEGATIVE NEGATIVE Final    Comment:  (NOTE) SARS-CoV-2 target nucleic acids are NOT DETECTED. The SARS-CoV-2 RNA is generally detectable in upper respiratoy specimens during the acute phase of infection. The lowest concentration of SARS-CoV-2 viral copies this assay can detect is 131 copies/mL. A negative result does not preclude SARS-Cov-2 infection and should not be used as the sole basis for treatment or other patient management decisions. A negative result may occur with  improper specimen collection/handling, submission of specimen other than nasopharyngeal swab, presence of viral mutation(s) within the areas targeted by this assay, and inadequate number of viral copies (<131 copies/mL). A negative result must be combined with clinical observations, patient history, and epidemiological information. The expected result is Negative. Fact Sheet for Patients:  PinkCheek.be Fact Sheet for Healthcare Providers:  GravelBags.it This test is not yet ap proved or cleared by the [place] FDA and  has been authorized for detection and/or diagnosis of SARS-CoV-2 by FDA under an Emergency Use Authorization (EUA). This EUA will remain  in effect (meaning this test can be used) for the duration of the COVID-19 declaration under Section 564(b)(1) of the Act, 21 U.S.C. section 360bbb-3(b)(1), unless the authorization is terminated or revoked sooner.    Influenza A by PCR NEGATIVE NEGATIVE Final   Influenza B by PCR NEGATIVE NEGATIVE Final    Comment: (NOTE) The Xpert Xpress SARS-CoV-2/FLU/RSV assay is intended as an aid in  the diagnosis of influenza from Nasopharyngeal swab specimens and  should not be used as a sole basis for treatment. Nasal washings and  aspirates are unacceptable for Xpert Xpress SARS-CoV-2/FLU/RSV  testing. Fact Sheet for Patients: PinkCheek.be Fact Sheet for Healthcare  Providers: GravelBags.it This test is not yet approved or cleared by the [place] FDA and  has been authorized for detection and/or diagnosis of SARS-CoV-2 by  FDA under an Emergency Use Authorization (EUA). This EUA will remain  in effect (meaning this test can be used) for the duration of the  Covid-19 declaration under Section 564(b)(1) of the Act, 21  U.S.C. section 360bbb-3(b)(1), unless the authorization is  terminated or revoked. Performed at [facility], [address], [place], [place] [address]   SARS Coronavirus 2 by RT PCR (hospital order, performed in [place]  lab) Nasopharyngeal Nasopharyngeal Swab     Status: None   Collection Time: 10/29/19  3:59 PM   Specimen: Nasopharyngeal Swab  Result Value Ref Range Status   SARS Coronavirus 2 NEGATIVE NEGATIVE Final    Comment: (NOTE) SARS-CoV-2 target nucleic acids are NOT DETECTED. The SARS-CoV-2 RNA is generally detectable in upper and lower respiratory specimens during the acute phase of infection. The lowest concentration of SARS-CoV-2 viral copies this assay can detect is 250 copies / mL. A negative result does not preclude SARS-CoV-2 infection and should not be used as the sole basis for treatment or other patient management decisions.  A negative result may occur with improper specimen collection / handling, submission of specimen other than nasopharyngeal swab, presence of viral mutation(s) within the areas targeted by this assay, and inadequate number of viral copies (<250 copies / mL). A negative result must be combined with clinical observations, patient history, and epidemiological information. Fact Sheet for Patients:   StrictlyIdeas.no Fact Sheet for Healthcare Providers: BankingDealers.co.za This test is not yet approved or cleared  by the [place] FDA and has been authorized for detection and/or diagnosis of  SARS-CoV-2 by FDA under an Emergency Use Authorization (EUA).  This EUA will remain in effect (meaning this test can be used) for the duration of the COVID-19 declaration under Section 564(b)(1) of the Act, 21 U.S.C. section 360bbb-3(b)(1), unless the authorization is terminated or revoked sooner. Performed at [facility], [address] [place], [place] [address]      Scheduled Meds: . vitamin C  500 mg Oral Daily  . cilostazol  50 mg Oral BID  . enoxaparin (LOVENOX) injection  30 mg Subcutaneous Q24H  . insulin aspart  0-15 Units Subcutaneous TID WC  . insulin aspart  0-5 Units Subcutaneous QHS  . multivitamin with minerals  1 tablet Oral Daily  . pantoprazole  40 mg Oral Daily  . pravastatin  80 mg Oral q morning - 10a   Continuous Infusions: . sodium chloride 75 mL/hr at 10/30/19 [id number]    Procedures/Studies: CT ABDOMEN PELVIS WO CONTRAST  Result Date: 10/24/2019 CLINICAL DATA:  AAA on x-ray EXAM: CT ABDOMEN AND PELVIS WITHOUT CONTRAST TECHNIQUE: Multidetector CT imaging of the abdomen and pelvis was performed following the standard protocol without IV contrast. COMPARISON:  Same-day lumbar radiograph, CT renal colic [id number] FINDINGS: Lower chest: Bandlike opacities in the lung bases compatible with atelectasis and/or scarring Normal heart size. No pericardial effusion. Calcifications of the coronary arteries and mitral annulus. Hepatobiliary: No focal liver abnormality is seen. Patient is post cholecystectomy. Slight prominence of the biliary tree likely related to reservoir effect. No calcified intraductal gallstones. Pancreas: Unremarkable. No pancreatic ductal dilatation or surrounding inflammatory changes. Spleen: Stable calcified lesion in the spleen, almost certainly benign. Could reflect a partially calcified granuloma versus hemangioma or lymphangioma given a slightly hypoattenuating appearance on comparison imaging. Otherwise normal spleen. Adrenals/Urinary Tract: Normal  adrenal glands. Mild bilateral perinephric stranding, left slightly greater than right. Chronically dilated right extrarenal pelvis likely secondary to UPJ obstruction. Bilateral urothelial thickening is noted of the proximal ureters and collecting systems. There are bilateral punctate calcifications likely reflecting either vascular calcium rich tiny collecting system calculi without visible obstructive urolithiasis at this time. No worrisome renal mass. Urinary bladder appears grossly unremarkable without significant wall thickening or stranding. Stomach/Bowel: Distal esophagus, stomach and duodenal sweep are unremarkable. No small bowel wall thickening or dilatation. No evidence of obstruction. Numerous fecaliths are present within the otherwise normal appendix  in the right lower quadrant. There is pancolonic diverticulosis. No convincing evidence of acute diverticulitis. Pelvic floor laxity with possible rectocele. Vascular/Lymphatic: There is extensive severe atherosclerotic calcification throughout the aorta and branch vessels. There is mild lobular fusiform dilatation at the level the celiac access and renal artery origins measuring up to 2.8 cm. Larger bilobed infrarenal abdominal aortic aneurysm measures up to 3.7 cm in size. Increased diameter on the same day lumbar radiographs may be spuriously increased with radiographic technique. Some hypodense mural thrombus is noted with partial calcification. No periaortic stranding or acute findings to suggest threatened rupture. Left iliac artery stenting is noted. Extensive calcification of the inflow and outflow vessels is seen. Reproductive: Patient is post hysterectomy. Radiopaque pessary device noted within the vaginal canal. Other: No abdominopelvic free fluid or air. No bowel containing hernias. Pelvic floor laxity as above. Musculoskeletal: The osseous structures appear diffusely demineralized which may limit detection of small or nondisplaced fractures.  No acute osseous abnormality or suspicious osseous lesion. Levocurvature of the lumbar spine is similar to comparison is. Multilevel degenerative changes are present in the imaged portions of the spine. IMPRESSION: 1. Bilobed infrarenal abdominal aortic aneurysm measures up to 3.7 cm in size. Increased diameter on the same day lumbar radiographs may be spuriously increased with radiographic technique. No periaortic stranding or acute findings to suggest threatened rupture. Pararenal fusiform dilatation is stable as well. 2. Left iliac artery stenting. Luminal evaluation of the vasculature cannot be assessed in the absence of contrast media. 3. Chronically dilated right extrarenal pelvis likely secondary to UPJ obstruction. Bilateral urothelial thickening is noted of the proximal ureters and collecting systems, nonspecific but can be seen with a ascending urinary tract infection. Correlate with urinalysis. 4. Bilateral punctate calcifications likely reflecting either vascular calcium rich tiny collecting system calculi without visible obstructive urolithiasis at this time. 5. Pancolonic diverticulosis without convincing evidence of acute diverticulitis. 6. Pelvic floor laxity with possible rectocele. Correlate with symptoms. 7. Aortic Atherosclerosis (ICD10-I70.0). Electronically Signed   By: Lovena Le M.D.   On: 10/24/2019 04:04   DG Lumbar Spine Complete  Result Date: 10/24/2019 CLINICAL DATA:  Low back pain, difficulty with ambulation, no known injury EXAM: LUMBAR SPINE - COMPLETE 4+ VIEW COMPARISON:  CT 06/25/2014 FINDINGS: The osseous structures appear diffusely demineralized which may limit detection of small or nondisplaced fractures. Five lumbar type vertebral bodies are visualized. There is levocurvature of the lumbar spine with an apex at the L3 level. Mild stepwise retrolisthesis L2-L4 with grade 1 anterolisthesis L4 on L5. Findings on a likely degenerative basis given advanced facet arthropathic  changes throughout the lumbar spine and multilevel disc height loss with vacuum disc and discogenic endplate changes. 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 Additional degenerative changes of the hips and SI joints. Extensive aortoiliac atherosclerosis with a bilobed abdominal aortic aneurysm measuring up to 4.7 cm in diameter, increased from 3.2 cm on comparison CT from 2016. Additional vascular calcium throughout the abdomen and pelvis. Few phleboliths in the pelvis as well. Normal bowel gas pattern. IMPRESSION: 1. Osseous demineralization, curvature, and advanced degenerative changes may limit detection of subtle fracture. No definite acute osseous injury. 2. Mild stepwise retrolisthesis L2-L4 with grade 1 anterolisthesis L4 on L5. 3. Moderate discogenic and facet degenerative changes throughout the lumbar spine. 4. Enlarging bilobed abdominal aortic aneurysm measuring up to 4.7 cm at this time. Angiographic imaging may be of clinical utility in the setting of atraumatic acute onset low back pain. Critical Value/emergent  results were called by telephone at the time of interpretation on 10/24/2019 at 1:37 am to provider [facility] , who verbally acknowledged these results. Electronically Signed   By: Lovena Le M.D.   On: 10/24/2019 01:35   US RENAL  Result Date: 10/24/2019 CLINICAL DATA:  Acute renal failure with chronic kidney disease. EXAM: RENAL / URINARY TRACT ULTRASOUND COMPLETE COMPARISON:  Abdominopelvic CT earlier today. Pararenal ultrasound and abdominal CT 06/23/2014 FINDINGS: Right Kidney: Renal measurements: 9.4 x 5.4 x 5.8 cm = volume: 153 mL. Right hydronephrosis which appears similar to prior imaging. There is mild thinning of the right renal parenchyma with borderline increased renal echogenicity. No evidence of focal mass. No renal stones demonstrated by ultrasound. Left Kidney: Renal measurements: 10.7 x 5.4 x 5.3 cm = volume: 169 mL. No hydronephrosis.  Normal renal echogenicity. No evidence of focal renal mass. No renal calculi demonstrated sonographically. Bladder: Decompressed by Foley catheter not well evaluated. Other: None. IMPRESSION: 1. Chronic right hydronephrosis which may be related to UPJ obstruction. This is unchanged dating back to 2016. There is thinning of the right renal parenchyma. 2. No left hydronephrosis. Electronically Signed   By: Keith Rake M.D.   On: 10/24/2019 14:25   DG Chest Port 1 View  Result Date: 10/29/2019 CLINICAL DATA:  Shortness of breath EXAM: PORTABLE CHEST 1 VIEW COMPARISON:  06/26/2014 FINDINGS: The heart size and mediastinal contours are within normal limits. Atherosclerotic calcification of the aortic knob. Mild hyperexpanded lungs. Chronic mild interstitial prominence. Linear atelectasis in the left lung base. No focal airspace consolidation, pleural effusion, or pneumothorax. The visualized skeletal structures are unremarkable. IMPRESSION: Chronic findings of COPD. No superimposed acute cardiopulmonary process. Electronically Signed   By: Davina Poke D.O.   On: 10/29/2019 11:25    Orson Eva, DO  [facility]  If 7PM-7AM, please contact night-coverage [email or web address] Password [facility] 10/30/2019, 8:33 AM   LOS: 0 days

## 2019-10-30 NOTE — TOC Progression Note (Signed)
Transition of Care [facility]) - Progression Note    Patient Details  Name: Ariana Shea MRN: [id number] Date of Birth: [birth year]  Transition of [facility]) CM/SW Contact  Salome Arnt, [place] Phone Number: 10/30/2019, 3:01 PM  Clinical Narrative:  Pt admitted from [facility]. Pt discharged to SNF from hospital several days ago for short term rehab. LCSW spoke with pt who reports she may return home from hospital, but will see how she is feeling at discharge. LCSW reviewed PT notes and discussed with pt. LCSW encouraged pt to consider return to SNF as pt lives alone and is not at baseline. Per Jackelyn Poling at [place], pt is okay to return and would need new authorization as pt is inpatient. Jackelyn Poling will start authorization. LCSW sent H&P and PT notes for auth.      Expected Discharge Plan and Services Return to [place].     Social Determinants of Health (SDOH) Interventions    Readmission Risk Interventions No flowsheet data found.

## 2019-10-30 NOTE — Progress Notes (Addendum)
Pharmacy Note regarding Insulin Glargine (Lantus):       Pharmacy consulted to find out if patient was on insulin glargine prior to admission today.  Discharge Summary from 10/27/19 includes insulin glargine 25 units subq BID but pharmacy medication reconciliation has not received [facility] fax requested from [facility] yet to confirm.  I called [facility] twice but was transferred to a line that did not pick up and the voice mailbox was full.  Informed Dr Jonnie Finner that I was unable to confirm insulin glargine and he ordered moderate dose sliding scale insulin aspart until then. UPDATE [id number] Called [facility] , patient on basaglar 25units BID, AM dose held 5/10. Last dose given per RN was 5/9 at 1800, evening dose. Will sign off. Med rec updated Please reconsult if additional information needed,  Isac Sarna, BS [place]ena [place], BCPS Clinical Pharmacist Pager [phone] 10/30/19 8:51 AM

## 2019-10-30 NOTE — Progress Notes (Signed)
*  PRELIMINARY RESULTS* Echocardiogram 2D Echocardiogram has been performed.  Ariana Shea 10/30/2019, 1:48 PM

## 2019-10-31 LAB — COMPREHENSIVE METABOLIC PANEL
ALT: 13 U/L (ref 0–44)
AST: 15 U/L (ref 15–41)
Albumin: 2.7 g/dL — ABNORMAL LOW (ref 3.5–5.0)
Alkaline Phosphatase: 48 U/L (ref 38–126)
Anion gap: 10 (ref 5–15)
BUN: 11 mg/dL (ref 8–23)
CO2: 29 mmol/L (ref 22–32)
Calcium: 8.1 mg/dL — ABNORMAL LOW (ref 8.9–10.3)
Chloride: 101 mmol/L (ref 98–111)
Creatinine, Ser: 0.87 mg/dL (ref 0.44–1.00)
GFR calc Af Amer: >60 mL/min (ref >60)
GFR calc non Af Amer: >60 mL/min (ref >60)
Glucose, Bld: 98 mg/dL (ref 70–99)
Potassium: 3.2 mmol/L — ABNORMAL LOW (ref 3.5–5.1)
Sodium: 140 mmol/L (ref 135–145)
Total Bilirubin: 0.8 mg/dL (ref 0.3–1.2)
Total Protein: 4.7 g/dL — ABNORMAL LOW (ref 6.5–8.1)

## 2019-10-31 LAB — CBC
HCT: 32.5 % — ABNORMAL LOW (ref 36.0–46.0)
Hemoglobin: 10.3 g/dL — ABNORMAL LOW (ref 12.0–15.0)
MCH: 29.8 pg (ref 26.0–34.0)
MCHC: 31.7 g/dL (ref 30.0–36.0)
MCV: 93.9 fL (ref 80.0–100.0)
Platelets: 189 10*3/uL (ref 150–400)
RBC: 3.46 MIL/uL — ABNORMAL LOW (ref 3.87–5.11)
RDW: 12.4 % (ref 11.5–15.5)
WBC: 8.9 10*3/uL (ref 4.0–10.5)
nRBC: 0 % (ref 0.0–0.2)

## 2019-10-31 LAB — GLUCOSE, CAPILLARY
Glucose-Capillary: 114 mg/dL — ABNORMAL HIGH (ref 70–99)
Glucose-Capillary: 159 mg/dL — ABNORMAL HIGH (ref 70–99)

## 2019-10-31 LAB — MAGNESIUM: Magnesium: 1.2 mg/dL — ABNORMAL LOW (ref 1.7–2.4)

## 2019-10-31 MED ORDER — CARVEDILOL 12.5 MG PO TABS
12.5000 mg | ORAL_TABLET | Freq: Two times a day (BID) | ORAL | Status: DC
  Administered 2019-10-31: 12.5 mg via ORAL
  Filled 2019-10-31: qty 1

## 2019-10-31 MED ORDER — BASAGLAR KWIKPEN 100 UNIT/ML ~~LOC~~ SOPN: 25.0000 [IU] | PEN_INJECTOR | Freq: Every day | SUBCUTANEOUS | Status: DC

## 2019-10-31 MED ORDER — POTASSIUM CHLORIDE CRYS ER 20 MEQ PO TBCR
20.0000 meq | EXTENDED_RELEASE_TABLET | Freq: Once | ORAL | Status: AC
  Administered 2019-10-31: 20 meq via ORAL
  Filled 2019-10-31: qty 1

## 2019-10-31 MED ORDER — CEFDINIR 300 MG PO CAPS: 300.0000 mg | ORAL_CAPSULE | Freq: Two times a day (BID) | ORAL | 0 refills | Status: DC

## 2019-10-31 MED ORDER — MAGNESIUM SULFATE 2 GM/50ML IV SOLN
2.0000 g | Freq: Once | INTRAVENOUS | Status: AC
  Administered 2019-10-31: 2 g via INTRAVENOUS
  Filled 2019-10-31: qty 50

## 2019-10-31 NOTE — Discharge Instructions (Signed)
1. Follow up with PCP in 1-2 weeks °2. Please obtain BMP/CBC in one week ° ° ° °

## 2019-10-31 NOTE — Discharge Summary (Addendum)
Physician Discharge Summary  Ariana Shea [id number] DOB: 1934/03/07 DOA: 10/29/2019  PCP: Caprice Renshaw, MD  Admit date: 10/29/2019 Discharge date: 10/31/2019  Admitted From: SNF Disposition:  Home--refuses SNF  Recommendations for Outpatient Follow-up:  1. Follow up with PCP in 1-2 weeks 2. Please obtain BMP/CBC in one week   Home Health: yes Equipment/Devices: HHPT, RN, SW  Discharge Condition: Stable CODE STATUS: FULL Diet recommendation: Carb Modified   Brief/Interim Summary: 86 y.o.femalewith medical history ofhypertension, diabetes mellitus, peripheral vascular disease, abdominal aortic aneurysm, hyperlipidemia, chronic dilated right extrarenal pelvis/obstructive uropathy presenting a near syncopal episode from [place].  Patient was recently mid to the hospital from 10/23/2019 to 10/27/2019 wherein she was treated for pyelonephritis and acute on chronic renal failure.  The patient was discharged to [facility].  She was on the commode on 10/29/2019 when she began feeling dizzy.  She described it as her head " feeling swimmy".  The patient had just urinated when she began having the symptoms and put her head between her legs.  Apparently, the nursing aides try to get her up when she felt even worse and stated she had difficulty raising her head up and subsequently sat back down.  She denied a total loss of consciousness.  She was awake the whole time.  The patient denied any prodromal symptoms including fevers, chills, visual disturbance, headache, chest pain, shortness of breath.  The patient denies any recent fevers, chills, coughing, hemoptysis, nausea, vomiting, diarrhea, dysuria, hematuria, hematochezia, melena. In the emergency department, the patient was afebrile hemodynamically stable with oxygen saturation 96% on 2 L.  BMP showed a serum creatinine of 1.71, potassium 3.5, sodium 141, WBC 9.3, hemoglobin 9.0.  Lactic acid was 1.6.  Troponins were unremarkable 34>>> 31.  EKG shows  sinus rhythm with nonspecific T wave changes.  Chest x-ray showed chronic interstitial prominence.  Patient was admitted for further evaluation and treatment.  Discharge Diagnoses:  Syncope/near syncope -Suspect volume depletion and vasovagal etiology -Orthostatic vital sign -echocardiogram--EF 40-45%, grade 1 DD -Continue IV fluids -Remain on telemetry--personally reviewed--sinus tachycardia with PVCs and PACs  Acute on chronic renal failure--CKD stage IIIb -Secondary to volume depletion -Continue IV fluids -baseline creatinine 1.0-1.3 -presented with serum creatinine 1.71  Cardiomyopathy -new finding by Echo -euvolemic at time of dc -set up outpt cardiology follow up  Hypokalemia/Hypomagnesemia -Replete -Check magnesium  Diabetes mellitus type 2 -10/24/2019 hemoglobin A1c 6.2 -Allow liberal glycemic control at this point -novolog sliding scale -restart reduced dose insulin glargine at time of dc  Essential hypertension -Restart carvedilol at lower dose  Peripheral vascular disease -Continue cilostazol -The patient has chronic right leg pain over 1 year  Pyelonephritis -Restart oral antibiotics to finish 10-day course -3 more days cefdinir after d/c  Hyperlipidemia -restart statin  Physical Deconditioning -PT-->SNF -patient refuses to return to SNF -d/[facility] home with [facility]   Discharge Instructions   Allergies as of 10/31/2019      Reactions   Red Blood Cells Other (See Comments)   Patient is a Jehovah's Witness   Penicillins Rash   Sulfonamide Derivatives Rash      Medication List    STOP taking these medications   cephALEXin 500 MG capsule Commonly known as: KEFLEX     TAKE these medications   acetaminophen 325 MG tablet Commonly known as: TYLENOL Take 2 tablets (650 mg total) by mouth every 6 (six) hours as needed for mild pain (or Fever >/= 101).   Basaglar KwikPen 100 UNIT/ML Inject 0.25 mLs (  25 Units total) into the skin daily. Hold if  BS<70 What changed: when to take this   carvedilol 25 MG tablet Commonly known as: COREG Take 1 tablet (25 mg total) by mouth 2 (two) times daily with a meal.   cefdinir 300 MG capsule Commonly known as: OMNICEF Take 1 capsule (300 mg total) by mouth every 12 (twelve) hours.   cilostazol 50 MG tablet Commonly known as: PLETAL Take 1 tablet (50 mg total) by mouth 2 (two) times daily.   Ferrex 150 150 MG capsule Generic drug: iron polysaccharides Take 150 mg by mouth 2 (two) times daily.   magnesium oxide 400 MG tablet Commonly known as: MAG-OX Take 1 tablet (400 mg total) by mouth 2 (two) times daily.   omeprazole 20 MG capsule Commonly known as: PRILOSEC Take 1 capsule (20 mg total) by mouth every morning.   ondansetron 4 MG tablet Commonly known as: ZOFRAN Take 1 tablet (4 mg total) by mouth every 6 (six) hours as needed for nausea.   pravastatin 80 MG tablet Commonly known as: PRAVACHOL Take 1 tablet (80 mg total) by mouth every morning.   vitamin C 500 MG tablet Commonly known as: ASCORBIC ACID Take 500 mg by mouth daily.       Allergies  Allergen Reactions  . Red Blood Cells Other (See Comments)    Patient is a Jehovah's Witness  . Penicillins Rash  . Sulfonamide Derivatives Rash    Consultations:  none   Procedures/Studies: CT ABDOMEN PELVIS WO CONTRAST  Result Date: 10/24/2019 CLINICAL DATA:  AAA on x-ray EXAM: CT ABDOMEN AND PELVIS WITHOUT CONTRAST TECHNIQUE: Multidetector CT imaging of the abdomen and pelvis was performed following the standard protocol without IV contrast. COMPARISON:  Same-day lumbar radiograph, CT renal colic [id number] FINDINGS: Lower chest: Bandlike opacities in the lung bases compatible with atelectasis and/or scarring Normal heart size. No pericardial effusion. Calcifications of the coronary arteries and mitral annulus. Hepatobiliary: No focal liver abnormality is seen. Patient is post cholecystectomy. Slight prominence of the  biliary tree likely related to reservoir effect. No calcified intraductal gallstones. Pancreas: Unremarkable. No pancreatic ductal dilatation or surrounding inflammatory changes. Spleen: Stable calcified lesion in the spleen, almost certainly benign. Could reflect a partially calcified granuloma versus hemangioma or lymphangioma given a slightly hypoattenuating appearance on comparison imaging. Otherwise normal spleen. Adrenals/Urinary Tract: Normal adrenal glands. Mild bilateral perinephric stranding, left slightly greater than right. Chronically dilated right extrarenal pelvis likely secondary to UPJ obstruction. Bilateral urothelial thickening is noted of the proximal ureters and collecting systems. There are bilateral punctate calcifications likely reflecting either vascular calcium rich tiny collecting system calculi without visible obstructive urolithiasis at this time. No worrisome renal mass. Urinary bladder appears grossly unremarkable without significant wall thickening or stranding. Stomach/Bowel: Distal esophagus, stomach and duodenal sweep are unremarkable. No small bowel wall thickening or dilatation. No evidence of obstruction. Numerous fecaliths are present within the otherwise normal appendix in the right lower quadrant. There is pancolonic diverticulosis. No convincing evidence of acute diverticulitis. Pelvic floor laxity with possible rectocele. Vascular/Lymphatic: There is extensive severe atherosclerotic calcification throughout the aorta and branch vessels. There is mild lobular fusiform dilatation at the level the celiac access and renal artery origins measuring up to 2.8 cm. Larger bilobed infrarenal abdominal aortic aneurysm measures up to 3.7 cm in size. Increased diameter on the same day lumbar radiographs may be spuriously increased with radiographic technique. Some hypodense mural thrombus is noted with partial calcification. No periaortic stranding or  acute findings to suggest  threatened rupture. Left iliac artery stenting is noted. Extensive calcification of the inflow and outflow vessels is seen. Reproductive: Patient is post hysterectomy. Radiopaque pessary device noted within the vaginal canal. Other: No abdominopelvic free fluid or air. No bowel containing hernias. Pelvic floor laxity as above. Musculoskeletal: The osseous structures appear diffusely demineralized which may limit detection of small or nondisplaced fractures. No acute osseous abnormality or suspicious osseous lesion. Levocurvature of the lumbar spine is similar to comparison is. Multilevel degenerative changes are present in the imaged portions of the spine. IMPRESSION: 1. Bilobed infrarenal abdominal aortic aneurysm measures up to 3.7 cm in size. Increased diameter on the same day lumbar radiographs may be spuriously increased with radiographic technique. No periaortic stranding or acute findings to suggest threatened rupture. Pararenal fusiform dilatation is stable as well. 2. Left iliac artery stenting. Luminal evaluation of the vasculature cannot be assessed in the absence of contrast media. 3. Chronically dilated right extrarenal pelvis likely secondary to UPJ obstruction. Bilateral urothelial thickening is noted of the proximal ureters and collecting systems, nonspecific but can be seen with a ascending urinary tract infection. Correlate with urinalysis. 4. Bilateral punctate calcifications likely reflecting either vascular calcium rich tiny collecting system calculi without visible obstructive urolithiasis at this time. 5. Pancolonic diverticulosis without convincing evidence of acute diverticulitis. 6. Pelvic floor laxity with possible rectocele. Correlate with symptoms. 7. Aortic Atherosclerosis (ICD10-I70.0). Electronically Signed   By: Lovena Le M.D.   On: 10/24/2019 04:04   DG Lumbar Spine Complete  Result Date: 10/24/2019 CLINICAL DATA:  Low back pain, difficulty with ambulation, no known injury  EXAM: LUMBAR SPINE - COMPLETE 4+ VIEW COMPARISON:  CT 06/25/2014 FINDINGS: The osseous structures appear diffusely demineralized which may limit detection of small or nondisplaced fractures. Five lumbar type vertebral bodies are visualized. There is levocurvature of the lumbar spine with an apex at the L3 level. Mild stepwise retrolisthesis L2-L4 with grade 1 anterolisthesis L4 on L5. Findings on a likely degenerative basis given advanced facet arthropathic changes throughout the lumbar spine and multilevel disc height loss with vacuum disc and discogenic endplate changes. 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 Additional degenerative changes of the hips and SI joints. Extensive aortoiliac atherosclerosis with a bilobed abdominal aortic aneurysm measuring up to 4.7 cm in diameter, increased from 3.2 cm on comparison CT from 2016. Additional vascular calcium throughout the abdomen and pelvis. Few phleboliths in the pelvis as well. Normal bowel gas pattern. IMPRESSION: 1. Osseous demineralization, curvature, and advanced degenerative changes may limit detection of subtle fracture. No definite acute osseous injury. 2. Mild stepwise retrolisthesis L2-L4 with grade 1 anterolisthesis L4 on L5. 3. Moderate discogenic and facet degenerative changes throughout the lumbar spine. 4. Enlarging bilobed abdominal aortic aneurysm measuring up to 4.7 cm at this time. Angiographic imaging may be of clinical utility in the setting of atraumatic acute onset low back pain. Critical Value/emergent results were called by telephone at the time of interpretation on 10/24/2019 at 1:37 am to provider [facility] , who verbally acknowledged these results. Electronically Signed   By: Lovena Le M.D.   On: 10/24/2019 01:35   US RENAL  Result Date: 10/24/2019 CLINICAL DATA:  Acute renal failure with chronic kidney disease. EXAM: RENAL / URINARY TRACT ULTRASOUND COMPLETE COMPARISON:  Abdominopelvic CT  earlier today. Pararenal ultrasound and abdominal CT 06/23/2014 FINDINGS: Right Kidney: Renal measurements: 9.4 x 5.4 x 5.8 cm = volume: 153 mL. Right hydronephrosis which appears  similar to prior imaging. There is mild thinning of the right renal parenchyma with borderline increased renal echogenicity. No evidence of focal mass. No renal stones demonstrated by ultrasound. Left Kidney: Renal measurements: 10.7 x 5.4 x 5.3 cm = volume: 169 mL. No hydronephrosis. Normal renal echogenicity. No evidence of focal renal mass. No renal calculi demonstrated sonographically. Bladder: Decompressed by Foley catheter not well evaluated. Other: None. IMPRESSION: 1. Chronic right hydronephrosis which may be related to UPJ obstruction. This is unchanged dating back to 2016. There is thinning of the right renal parenchyma. 2. No left hydronephrosis. Electronically Signed   By: Keith Rake M.D.   On: 10/24/2019 14:25   DG Chest Port 1 View  Result Date: 10/29/2019 CLINICAL DATA:  Shortness of breath EXAM: PORTABLE CHEST 1 VIEW COMPARISON:  06/26/2014 FINDINGS: The heart size and mediastinal contours are within normal limits. Atherosclerotic calcification of the aortic knob. Mild hyperexpanded lungs. Chronic mild interstitial prominence. Linear atelectasis in the left lung base. No focal airspace consolidation, pleural effusion, or pneumothorax. The visualized skeletal structures are unremarkable. IMPRESSION: Chronic findings of COPD. No superimposed acute cardiopulmonary process. Electronically Signed   By: Davina Poke D.O.   On: 10/29/2019 11:25   ECHOCARDIOGRAM COMPLETE  Result Date: 10/30/2019    ECHOCARDIOGRAM REPORT   Patient Name:   VIVAN ALIOTTA Date of Exam: 10/30/2019 Medical Rec #:  [id number]     Height:       63.0 in Accession #:    [id number]    Weight:       138.7 lb Date of Birth:  June 29, 1933      BSA:          1.655 m Patient Age:    86 years      BP:           143/89 mmHg Patient Gender: F              HR:           88 bpm. Exam Location:  [facility] Procedure: 2D Echo, Cardiac Doppler and Color Doppler Indications:    Syncope 780.2 / R55  History:        Patient has no prior history of Echocardiogram examinations.                 Risk Factors:Hypertension, Diabetes and Dyslipidemia. Acute                 renal failure superimposed on stage 3 chronic kidney                 disease,Carotid artery disease,Superficial nodular basal cell                 carcinoma (BCC) (From Hx).  Sonographer:    Alvino Chapel RCS Referring Phys: [phone] Astaria Nanez IMPRESSIONS  1. Left ventricular ejection fraction, by estimation, is 40 to 45%. The left ventricle has mildly decreased function. The left ventricle demonstrates regional wall motion abnormalities (see scoring diagram/findings for description). Left ventricular diastolic parameters are consistent with Grade I diastolic dysfunction (impaired relaxation). Elevated left ventricular end-diastolic pressure.  2. Right ventricular systolic function is mildly reduced. The right ventricular size is normal. There is normal pulmonary artery systolic pressure.  3. The mitral valve is degenerative. Mild mitral valve regurgitation.  4. The aortic valve is tricuspid. Aortic valve regurgitation is not visualized. No aortic stenosis is present.  5. The inferior vena cava is normal in size with greater than  50% respiratory variability, suggesting right atrial pressure of 3 mmHg. FINDINGS  Left Ventricle: Left ventricular ejection fraction, by estimation, is 40 to 45%. The left ventricle has mildly decreased function. The left ventricle demonstrates regional wall motion abnormalities. The left ventricular internal cavity size was normal in size. There is no left ventricular hypertrophy. Left ventricular diastolic parameters are consistent with Grade I diastolic dysfunction (impaired relaxation). Elevated left ventricular end-diastolic pressure.  LV Wall Scoring: The mid and distal anterior wall,  mid and distal anterior septum, inferior septum, and apex are hypokinetic. Right Ventricle: The right ventricular size is normal. No increase in right ventricular wall thickness. Right ventricular systolic function is mildly reduced. There is normal pulmonary artery systolic pressure. The tricuspid regurgitant velocity is 1.99 m/s, and with an assumed right atrial pressure of 3 mmHg, the estimated right ventricular systolic pressure is 123456 mmHg. Left Atrium: Left atrial size was normal in size. Right Atrium: Right atrial size was normal in size. Pericardium: There is no evidence of pericardial effusion. Mitral Valve: The mitral valve is degenerative in appearance. There is mild thickening of the mitral valve leaflet(s). Moderate mitral annular calcification. Mild mitral valve regurgitation. Tricuspid Valve: The tricuspid valve is grossly normal. Tricuspid valve regurgitation is mild. Aortic Valve: The aortic valve is tricuspid. . There is mild thickening of the aortic valve. Aortic valve regurgitation is not visualized. No aortic stenosis is present. Mild to moderate aortic valve annular calcification. There is mild thickening of the  aortic valve. Pulmonic Valve: The pulmonic valve was not well visualized. Pulmonic valve regurgitation is mild. Aorta: The aortic root is normal in size and structure. Venous: The inferior vena cava is normal in size with greater than 50% respiratory variability, suggesting right atrial pressure of 3 mmHg. IAS/Shunts: The interatrial septum appears to be lipomatous. No atrial level shunt detected by color flow Doppler.  LEFT VENTRICLE PLAX 2D LVIDd:         5.35 cm     Diastology LVIDs:         3.44 cm     LV e' lateral:   4.68 cm/s LV PW:         0.79 cm     LV E/e' lateral: 16.4 LV IVS:        0.92 cm     LV e' medial:    5.55 cm/s LVOT diam:     1.80 cm     LV E/e' medial:  13.8 LV SV:         55 LV SV Index:   34 LVOT Area:     2.54 cm  LV Volumes (MOD) LV vol d, MOD A2C: 48.3 ml  LV vol d, MOD A4C: 56.3 ml LV vol s, MOD A2C: 20.7 ml LV vol s, MOD A4C: 26.7 ml LV SV MOD A2C:     27.6 ml LV SV MOD A4C:     56.3 ml LV SV MOD BP:      31.5 ml RIGHT VENTRICLE RV S prime:     10.40 cm/s TAPSE (M-mode): 1.2 cm LEFT ATRIUM             Index       RIGHT ATRIUM          Index LA diam:        3.40 cm 2.05 cm/m  RA Area:     8.31 cm LA Vol (A2C):   59.1 ml 35.71 ml/m RA Volume:   14.30 ml 8.64 ml/m  LA Vol (A4C):   40.8 ml 24.65 ml/m LA Biplane Vol: 52.1 ml 31.48 ml/m  AORTIC VALVE LVOT Vmax:   112.00 cm/s LVOT Vmean:  66.000 cm/s LVOT VTI:    0.218 m  AORTA Ao Root diam: 3.00 cm MITRAL VALVE                TRICUSPID VALVE MV Area (PHT): 3.99 cm     TR Peak grad:   15.8 mmHg MV Decel Time: 190 msec     TR Vmax:        199.00 cm/s MV E velocity: 76.60 cm/s MV A velocity: 138.00 cm/s  SHUNTS MV E/A ratio:  0.56         Systemic VTI:  0.22 m                             Systemic Diam: 1.80 cm Kate Sable MD Electronically signed by Kate Sable MD Signature Date/Time: 10/30/2019/3:22:38 PM    Final         Discharge Exam: Vitals:   10/30/19 2042 10/31/19 0500  BP: 122/66 (!) 149/80  Pulse: 98 (!) 101  Resp: 20 20  Temp: 98.6 F (37 C) 98.4 F (36.9 C)  SpO2: 94% 93%   Vitals:   10/30/19 1400 10/30/19 1928 10/30/19 2042 10/31/19 0500  BP: 136/84  122/66 (!) 149/80  Pulse: 89  98 (!) 101  Resp: 20  20 20   Temp: 98.3 F (36.8 C)  98.6 F (37 C) 98.4 F (36.9 C)  TempSrc:   Oral Oral  SpO2: 97% 96% 94% 93%  Weight:      Height:        General: Pt is alert, awake, not in acute distress Cardiovascular: RRR, S1/S2 +, no rubs, no gallops Respiratory: CTA bilaterally, no wheezing, no rhonchi Abdominal: Soft, NT, ND, bowel sounds + Extremities: no edema, no cyanosis   The results of significant diagnostics from this hospitalization (including imaging, microbiology, ancillary and laboratory) are listed below for reference.    Significant Diagnostic Studies: CT  ABDOMEN PELVIS WO CONTRAST  Result Date: 10/24/2019 CLINICAL DATA:  AAA on x-ray EXAM: CT ABDOMEN AND PELVIS WITHOUT CONTRAST TECHNIQUE: Multidetector CT imaging of the abdomen and pelvis was performed following the standard protocol without IV contrast. COMPARISON:  Same-day lumbar radiograph, CT renal colic [id number] FINDINGS: Lower chest: Bandlike opacities in the lung bases compatible with atelectasis and/or scarring Normal heart size. No pericardial effusion. Calcifications of the coronary arteries and mitral annulus. Hepatobiliary: No focal liver abnormality is seen. Patient is post cholecystectomy. Slight prominence of the biliary tree likely related to reservoir effect. No calcified intraductal gallstones. Pancreas: Unremarkable. No pancreatic ductal dilatation or surrounding inflammatory changes. Spleen: Stable calcified lesion in the spleen, almost certainly benign. Could reflect a partially calcified granuloma versus hemangioma or lymphangioma given a slightly hypoattenuating appearance on comparison imaging. Otherwise normal spleen. Adrenals/Urinary Tract: Normal adrenal glands. Mild bilateral perinephric stranding, left slightly greater than right. Chronically dilated right extrarenal pelvis likely secondary to UPJ obstruction. Bilateral urothelial thickening is noted of the proximal ureters and collecting systems. There are bilateral punctate calcifications likely reflecting either vascular calcium rich tiny collecting system calculi without visible obstructive urolithiasis at this time. No worrisome renal mass. Urinary bladder appears grossly unremarkable without significant wall thickening or stranding. Stomach/Bowel: Distal esophagus, stomach and duodenal sweep are unremarkable. No small bowel wall thickening or dilatation. No evidence of obstruction. Numerous  fecaliths are present within the otherwise normal appendix in the right lower quadrant. There is pancolonic diverticulosis. No convincing  evidence of acute diverticulitis. Pelvic floor laxity with possible rectocele. Vascular/Lymphatic: There is extensive severe atherosclerotic calcification throughout the aorta and branch vessels. There is mild lobular fusiform dilatation at the level the celiac access and renal artery origins measuring up to 2.8 cm. Larger bilobed infrarenal abdominal aortic aneurysm measures up to 3.7 cm in size. Increased diameter on the same day lumbar radiographs may be spuriously increased with radiographic technique. Some hypodense mural thrombus is noted with partial calcification. No periaortic stranding or acute findings to suggest threatened rupture. Left iliac artery stenting is noted. Extensive calcification of the inflow and outflow vessels is seen. Reproductive: Patient is post hysterectomy. Radiopaque pessary device noted within the vaginal canal. Other: No abdominopelvic free fluid or air. No bowel containing hernias. Pelvic floor laxity as above. Musculoskeletal: The osseous structures appear diffusely demineralized which may limit detection of small or nondisplaced fractures. No acute osseous abnormality or suspicious osseous lesion. Levocurvature of the lumbar spine is similar to comparison is. Multilevel degenerative changes are present in the imaged portions of the spine. IMPRESSION: 1. Bilobed infrarenal abdominal aortic aneurysm measures up to 3.7 cm in size. Increased diameter on the same day lumbar radiographs may be spuriously increased with radiographic technique. No periaortic stranding or acute findings to suggest threatened rupture. Pararenal fusiform dilatation is stable as well. 2. Left iliac artery stenting. Luminal evaluation of the vasculature cannot be assessed in the absence of contrast media. 3. Chronically dilated right extrarenal pelvis likely secondary to UPJ obstruction. Bilateral urothelial thickening is noted of the proximal ureters and collecting systems, nonspecific but can be seen with  a ascending urinary tract infection. Correlate with urinalysis. 4. Bilateral punctate calcifications likely reflecting either vascular calcium rich tiny collecting system calculi without visible obstructive urolithiasis at this time. 5. Pancolonic diverticulosis without convincing evidence of acute diverticulitis. 6. Pelvic floor laxity with possible rectocele. Correlate with symptoms. 7. Aortic Atherosclerosis (ICD10-I70.0). Electronically Signed   By: Lovena Le M.D.   On: 10/24/2019 04:04   DG Lumbar Spine Complete  Result Date: 10/24/2019 CLINICAL DATA:  Low back pain, difficulty with ambulation, no known injury EXAM: LUMBAR SPINE - COMPLETE 4+ VIEW COMPARISON:  CT 06/25/2014 FINDINGS: The osseous structures appear diffusely demineralized which may limit detection of small or nondisplaced fractures. Five lumbar type vertebral bodies are visualized. There is levocurvature of the lumbar spine with an apex at the L3 level. Mild stepwise retrolisthesis L2-L4 with grade 1 anterolisthesis L4 on L5. Findings on a likely degenerative basis given advanced facet arthropathic changes throughout the lumbar spine and multilevel disc height loss with vacuum disc and discogenic endplate changes. 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 Additional degenerative changes of the hips and SI joints. Extensive aortoiliac atherosclerosis with a bilobed abdominal aortic aneurysm measuring up to 4.7 cm in diameter, increased from 3.2 cm on comparison CT from 2016. Additional vascular calcium throughout the abdomen and pelvis. Few phleboliths in the pelvis as well. Normal bowel gas pattern. IMPRESSION: 1. Osseous demineralization, curvature, and advanced degenerative changes may limit detection of subtle fracture. No definite acute osseous injury. 2. Mild stepwise retrolisthesis L2-L4 with grade 1 anterolisthesis L4 on L5. 3. Moderate discogenic and facet degenerative changes throughout the lumbar spine. 4.  Enlarging bilobed abdominal aortic aneurysm measuring up to 4.7 cm at this time. Angiographic imaging may be of clinical utility in the setting  of atraumatic acute onset low back pain. Critical Value/emergent results were called by telephone at the time of interpretation on 10/24/2019 at 1:37 am to provider [facility] , who verbally acknowledged these results. Electronically Signed   By: Lovena Le M.D.   On: 10/24/2019 01:35   US RENAL  Result Date: 10/24/2019 CLINICAL DATA:  Acute renal failure with chronic kidney disease. EXAM: RENAL / URINARY TRACT ULTRASOUND COMPLETE COMPARISON:  Abdominopelvic CT earlier today. Pararenal ultrasound and abdominal CT 06/23/2014 FINDINGS: Right Kidney: Renal measurements: 9.4 x 5.4 x 5.8 cm = volume: 153 mL. Right hydronephrosis which appears similar to prior imaging. There is mild thinning of the right renal parenchyma with borderline increased renal echogenicity. No evidence of focal mass. No renal stones demonstrated by ultrasound. Left Kidney: Renal measurements: 10.7 x 5.4 x 5.3 cm = volume: 169 mL. No hydronephrosis. Normal renal echogenicity. No evidence of focal renal mass. No renal calculi demonstrated sonographically. Bladder: Decompressed by Foley catheter not well evaluated. Other: None. IMPRESSION: 1. Chronic right hydronephrosis which may be related to UPJ obstruction. This is unchanged dating back to 2016. There is thinning of the right renal parenchyma. 2. No left hydronephrosis. Electronically Signed   By: Keith Rake M.D.   On: 10/24/2019 14:25   DG Chest Port 1 View  Result Date: 10/29/2019 CLINICAL DATA:  Shortness of breath EXAM: PORTABLE CHEST 1 VIEW COMPARISON:  06/26/2014 FINDINGS: The heart size and mediastinal contours are within normal limits. Atherosclerotic calcification of the aortic knob. Mild hyperexpanded lungs. Chronic mild interstitial prominence. Linear atelectasis in the left lung base. No focal airspace consolidation,  pleural effusion, or pneumothorax. The visualized skeletal structures are unremarkable. IMPRESSION: Chronic findings of COPD. No superimposed acute cardiopulmonary process. Electronically Signed   By: Davina Poke D.O.   On: 10/29/2019 11:25   ECHOCARDIOGRAM COMPLETE  Result Date: 10/30/2019    ECHOCARDIOGRAM REPORT   Patient Name:   Ariana Shea Date of Exam: 10/30/2019 Medical Rec #:  [id number]     Height:       63.0 in Accession #:    [id number]    Weight:       138.7 lb Date of Birth:  1933-09-04      BSA:          1.655 m Patient Age:    10 years      BP:           143/89 mmHg Patient Gender: F             HR:           88 bpm. Exam Location:  [facility] Procedure: 2D Echo, Cardiac Doppler and Color Doppler Indications:    Syncope 780.2 / R55  History:        Patient has no prior history of Echocardiogram examinations.                 Risk Factors:Hypertension, Diabetes and Dyslipidemia. Acute                 renal failure superimposed on stage 3 chronic kidney                 disease,Carotid artery disease,Superficial nodular basal cell                 carcinoma (BCC) (From Hx).  Sonographer:    Alvino Chapel RCS Referring Phys: [phone] Leia Coletti IMPRESSIONS  1. Left ventricular ejection fraction, by estimation, is 40  to 45%. The left ventricle has mildly decreased function. The left ventricle demonstrates regional wall motion abnormalities (see scoring diagram/findings for description). Left ventricular diastolic parameters are consistent with Grade I diastolic dysfunction (impaired relaxation). Elevated left ventricular end-diastolic pressure.  2. Right ventricular systolic function is mildly reduced. The right ventricular size is normal. There is normal pulmonary artery systolic pressure.  3. The mitral valve is degenerative. Mild mitral valve regurgitation.  4. The aortic valve is tricuspid. Aortic valve regurgitation is not visualized. No aortic stenosis is present.  5. The inferior vena cava is  normal in size with greater than 50% respiratory variability, suggesting right atrial pressure of 3 mmHg. FINDINGS  Left Ventricle: Left ventricular ejection fraction, by estimation, is 40 to 45%. The left ventricle has mildly decreased function. The left ventricle demonstrates regional wall motion abnormalities. The left ventricular internal cavity size was normal in size. There is no left ventricular hypertrophy. Left ventricular diastolic parameters are consistent with Grade I diastolic dysfunction (impaired relaxation). Elevated left ventricular end-diastolic pressure.  LV Wall Scoring: The mid and distal anterior wall, mid and distal anterior septum, inferior septum, and apex are hypokinetic. Right Ventricle: The right ventricular size is normal. No increase in right ventricular wall thickness. Right ventricular systolic function is mildly reduced. There is normal pulmonary artery systolic pressure. The tricuspid regurgitant velocity is 1.99 m/s, and with an assumed right atrial pressure of 3 mmHg, the estimated right ventricular systolic pressure is 123456 mmHg. Left Atrium: Left atrial size was normal in size. Right Atrium: Right atrial size was normal in size. Pericardium: There is no evidence of pericardial effusion. Mitral Valve: The mitral valve is degenerative in appearance. There is mild thickening of the mitral valve leaflet(s). Moderate mitral annular calcification. Mild mitral valve regurgitation. Tricuspid Valve: The tricuspid valve is grossly normal. Tricuspid valve regurgitation is mild. Aortic Valve: The aortic valve is tricuspid. . There is mild thickening of the aortic valve. Aortic valve regurgitation is not visualized. No aortic stenosis is present. Mild to moderate aortic valve annular calcification. There is mild thickening of the  aortic valve. Pulmonic Valve: The pulmonic valve was not well visualized. Pulmonic valve regurgitation is mild. Aorta: The aortic root is normal in size and  structure. Venous: The inferior vena cava is normal in size with greater than 50% respiratory variability, suggesting right atrial pressure of 3 mmHg. IAS/Shunts: The interatrial septum appears to be lipomatous. No atrial level shunt detected by color flow Doppler.  LEFT VENTRICLE PLAX 2D LVIDd:         5.35 cm     Diastology LVIDs:         3.44 cm     LV e' lateral:   4.68 cm/s LV PW:         0.79 cm     LV E/e' lateral: 16.4 LV IVS:        0.92 cm     LV e' medial:    5.55 cm/s LVOT diam:     1.80 cm     LV E/e' medial:  13.8 LV SV:         55 LV SV Index:   34 LVOT Area:     2.54 cm  LV Volumes (MOD) LV vol d, MOD A2C: 48.3 ml LV vol d, MOD A4C: 56.3 ml LV vol s, MOD A2C: 20.7 ml LV vol s, MOD A4C: 26.7 ml LV SV MOD A2C:     27.6 ml LV SV MOD A4C:  56.3 ml LV SV MOD BP:      31.5 ml RIGHT VENTRICLE RV S prime:     10.40 cm/s TAPSE (M-mode): 1.2 cm LEFT ATRIUM             Index       RIGHT ATRIUM          Index LA diam:        3.40 cm 2.05 cm/m  RA Area:     8.31 cm LA Vol (A2C):   59.1 ml 35.71 ml/m RA Volume:   14.30 ml 8.64 ml/m LA Vol (A4C):   40.8 ml 24.65 ml/m LA Biplane Vol: 52.1 ml 31.48 ml/m  AORTIC VALVE LVOT Vmax:   112.00 cm/s LVOT Vmean:  66.000 cm/s LVOT VTI:    0.218 m  AORTA Ao Root diam: 3.00 cm MITRAL VALVE                TRICUSPID VALVE MV Area (PHT): 3.99 cm     TR Peak grad:   15.8 mmHg MV Decel Time: 190 msec     TR Vmax:        199.00 cm/s MV E velocity: 76.60 cm/s MV A velocity: 138.00 cm/s  SHUNTS MV E/A ratio:  0.56         Systemic VTI:  0.22 m                             Systemic Diam: 1.80 cm Kate Sable MD Electronically signed by Kate Sable MD Signature Date/Time: 10/30/2019/3:22:38 PM    Final      Microbiology: Recent Results (from the past 240 hour(s))  Urine Culture     Status: Abnormal   Collection Time: 10/24/19  1:14 AM   Specimen: Urine, Clean Catch  Result Value Ref Range Status   Specimen Description   Final    URINE, CLEAN CATCH Performed  at [facility], [address] [place], [place] [address]    Special Requests   Final    NONE Performed at [facility], [address], [place], [place] [address]    Culture >=100,000 COLONIES/mL KLEBSIELLA PNEUMONIAE (A)  Final   Report Status 10/26/2019 FINAL  Final   Organism ID, Bacteria KLEBSIELLA PNEUMONIAE (A)  Final      Susceptibility   Klebsiella pneumoniae - MIC*    AMPICILLIN >=32 RESISTANT Resistant     CEFAZOLIN <=4 SENSITIVE Sensitive     CEFTRIAXONE <=1 SENSITIVE Sensitive     CIPROFLOXACIN <=0.25 SENSITIVE Sensitive     GENTAMICIN <=1 SENSITIVE Sensitive     IMIPENEM 1 SENSITIVE Sensitive     NITROFURANTOIN 64 INTERMEDIATE Intermediate     TRIMETH/SULFA <=20 SENSITIVE Sensitive     AMPICILLIN/SULBACTAM 4 SENSITIVE Sensitive     PIP/TAZO <=4 SENSITIVE Sensitive     * >=100,000 COLONIES/mL KLEBSIELLA PNEUMONIAE  Respiratory Panel by RT PCR (Flu A&B, Covid) - Nasopharyngeal Swab     Status: None   Collection Time: 10/24/19  3:58 AM   Specimen: Nasopharyngeal Swab  Result Value Ref Range Status   SARS Coronavirus 2 by RT PCR NEGATIVE NEGATIVE Final    Comment: (NOTE) SARS-CoV-2 target nucleic acids are NOT DETECTED. The SARS-CoV-2 RNA is generally detectable in upper respiratoy specimens during the acute phase of infection. The lowest concentration of SARS-CoV-2 viral copies this assay can detect is 131 copies/mL. A negative result does not preclude SARS-Cov-2 infection and should not be used  as the sole basis for treatment or other patient management decisions. A negative result may occur with  improper specimen collection/handling, submission of specimen other than nasopharyngeal swab, presence of viral mutation(s) within the areas targeted by this assay, and inadequate number of viral copies (<131 copies/mL). A negative result must be combined with clinical observations, patient history, and epidemiological information. The expected result is Negative. Fact  Sheet for Patients:  PinkCheek.be Fact Sheet for Healthcare Providers:  GravelBags.it This test is not yet ap proved or cleared by the [place] FDA and  has been authorized for detection and/or diagnosis of SARS-CoV-2 by FDA under an Emergency Use Authorization (EUA). This EUA will remain  in effect (meaning this test can be used) for the duration of the COVID-19 declaration under Section 564(b)(1) of the Act, 21 U.S.C. section 360bbb-3(b)(1), unless the authorization is terminated or revoked sooner.    Influenza A by PCR NEGATIVE NEGATIVE Final   Influenza B by PCR NEGATIVE NEGATIVE Final    Comment: (NOTE) The Xpert Xpress SARS-CoV-2/FLU/RSV assay is intended as an aid in  the diagnosis of influenza from Nasopharyngeal swab specimens and  should not be used as a sole basis for treatment. Nasal washings and  aspirates are unacceptable for Xpert Xpress SARS-CoV-2/FLU/RSV  testing. Fact Sheet for Patients: PinkCheek.be Fact Sheet for Healthcare Providers: GravelBags.it This test is not yet approved or cleared by the [place] FDA and  has been authorized for detection and/or diagnosis of SARS-CoV-2 by  FDA under an Emergency Use Authorization (EUA). This EUA will remain  in effect (meaning this test can be used) for the duration of the  Covid-19 declaration under Section 564(b)(1) of the Act, 21  U.S.C. section 360bbb-3(b)(1), unless the authorization is  terminated or revoked. Performed at [facility], [address], [place], [place] [address]   SARS Coronavirus 2 by RT PCR (hospital order, performed in [facility] hospital lab) Nasopharyngeal Nasopharyngeal Swab     Status: None   Collection Time: 10/29/19  3:59 PM   Specimen: Nasopharyngeal Swab  Result Value Ref Range Status   SARS Coronavirus 2 NEGATIVE NEGATIVE Final    Comment: (NOTE) SARS-CoV-2  target nucleic acids are NOT DETECTED. The SARS-CoV-2 RNA is generally detectable in upper and lower respiratory specimens during the acute phase of infection. The lowest concentration of SARS-CoV-2 viral copies this assay can detect is 250 copies / mL. A negative result does not preclude SARS-CoV-2 infection and should not be used as the sole basis for treatment or other patient management decisions.  A negative result may occur with improper specimen collection / handling, submission of specimen other than nasopharyngeal swab, presence of viral mutation(s) within the areas targeted by this assay, and inadequate number of viral copies (<250 copies / mL). A negative result must be combined with clinical observations, patient history, and epidemiological information. Fact Sheet for Patients:   StrictlyIdeas.no Fact Sheet for Healthcare Providers: BankingDealers.co.za This test is not yet approved or cleared  by the [place] FDA and has been authorized for detection and/or diagnosis of SARS-CoV-2 by FDA under an Emergency Use Authorization (EUA).  This EUA will remain in effect (meaning this test can be used) for the duration of the COVID-19 declaration under Section 564(b)(1) of the Act, 21 U.S.C. section 360bbb-3(b)(1), unless the authorization is terminated or revoked sooner. Performed at [facility], [address] [place], [place] [address]      Labs: Basic Metabolic Panel: Recent Labs  Lab 10/26/19  [id number] 10/26/19 0504 10/27/19 0610 10/27/19 0610 10/29/19 1141 10/29/19 1141 10/30/19 0507 10/31/19 0453  NA 150*  --  144  --  141  --  139 140  K 3.9   < > 3.0*   < > 3.5   < > 2.9* 3.2*  CL 107  --  101  --  100  --  99 101  CO2 24  --  30  --  30  --  29 29  GLUCOSE 150*  --  129*  --  138*  --  93 98  BUN 41*  --  22  --  21  --  14 11  CREATININE 1.04*  --  0.87  --  1.71*  --  0.88 0.87  CALCIUM 8.3*  --  8.4*  --   8.4*  --  7.8* 8.1*  MG  --   --   --   --   --   --  1.2* 1.2*  PHOS 2.3*  --   --   --   --   --   --   --    < > = values in this interval not displayed.   Liver Function Tests: Recent Labs  Lab 10/26/19 0504 10/29/19 1141 10/31/19 0453  AST  --  20 15  ALT  --  16 13  ALKPHOS  --  57 48  BILITOT  --  0.7 0.8  PROT  --  5.6* 4.7*  ALBUMIN 3.5 3.3* 2.7*   No results for input(s): LIPASE, AMYLASE in the last 168 hours. No results for input(s): AMMONIA in the last 168 hours. CBC: Recent Labs  Lab 10/25/19 0512 10/26/19 0504 10/29/19 1141 10/30/19 0507 10/31/19 0453  WBC 7.2 8.1 9.3 10.0 8.9  HGB 10.6* 11.1* 11.0* 10.8* 10.3*  HCT 32.3* 35.5* 35.7* 34.0* 32.5*  MCV 92.3 97.3 95.5 95.8 93.9  PLT 233 248 205 202 189   Cardiac Enzymes: No results for input(s): CKTOTAL, CKMB, CKMBINDEX, TROPONINI in the last 168 hours. BNP: Invalid input(s): POCBNP CBG: Recent Labs  Lab 10/30/19 1151 10/30/19 1621 10/30/19 2019 10/31/19 0724 10/31/19 1123  GLUCAP 112* 100* 125* 114* 159*    Time coordinating discharge:  36 minutes  Signed:  Orson Eva, DO [facility] Pager: [phone] 10/31/2019, 11:28 AM

## 2019-10-31 NOTE — TOC Transition Note (Addendum)
Transition of Care [facility]) - CM/SW Discharge Note   Patient Details  Name: Ariana Shea MRN: [id number] Date of Birth: 03-Feb-1934  [facility] CM/SW Contact:  Boneta Lucks, RN Phone Number: 10/31/2019, 11:36 AM   Clinical Narrative:   Patient discharging home, admitted from [facility]. TOC spoke to Netta Cedars, he is also ok with patient going home. Requesting Home health, TOC working on that referral. Patient needs 3N1, ordered and Blake Divine will deliver to the room.   Addendum:  Romualdo Bolk with [facility] accepted the referral for home health.   Debbie with [facility] updated that patient is discharging home.  Final next level of care: Normal Barriers to Discharge: Barriers Resolved   Patient Goals and CMS Choice     Discharge Placement   Home - with Home Health  Discharge Plan and Services                DME Arranged: 3-N-1, Bedside commode   Date DME Agency Contacted: 10/31/19 Time DME Agency Contacted: [phone] Representative spoke with at DME Agency: Blake Divine

## 2019-11-23 ENCOUNTER — Ambulatory Visit: Payer: Medicare Other | Admitting: Student

## 2019-11-23 ENCOUNTER — Encounter: Payer: Self-pay | Admitting: Student

## 2019-11-23 NOTE — Progress Notes (Deleted)
Cardiology Office Note    Date:  11/23/2019   ID:  Ariana Shea, DOB [birth year], MRN [id number]  PCP:  Caprice Renshaw, MD  Cardiologist: Quay Burow, MD    No chief complaint on file.   History of Present Illness:    Ariana Shea is a 86 y.o. female with past medical history of HTN, HLD, Type 3 DM, PVD (s/p prior left iliac stenting in 2008), carotid artery stenosis (s/p bilateral endarterectomy in 2008) and  Stage 3 CKD who presents to the office today for hospital follow-up.   She was last examined by Dr. Gwenlyn Found in 10/2017 and denied any chest pain or palpitations at that time. She did report claudication along her right lower extremity with continued medical management recommended as recent ABI's showed moderate right lower extremity arterial disease.   In the interim, she was most recently admitted to [facility] from 5/10 - 10/31/2019 for evaluation of presyncope with associated dizziness which occurred after urination. She was found to have an AKI and her presyncope was felt to be secondary to dehydration. She received IVF and creatinine improved to 0.87 prior to discharge. HS Troponin values were flat at 34 and 31. An echocardiogram was obtained as part of her work-up and showed her EF was mildly reduced at 40-45% with the mid and distal anterior wall, mid and distal anterior septum, inferior septum, and apex being hypokinetic. She also had Grade 1 DD and elevated LVEDP with mild MR. She was continued on PTA Coreg 25mg  BID at discharge.   - ARB?  Past Medical History:  Diagnosis Date  . Abdominal aortic aneurysm ([place]) 06/30/2014   Recommend follow-up ultrasound in 2018.  Marland Kitchen Anemia   . Arthritis    HANDS  . BCC (basal cell carcinoma of skin) 12/16/2015   Right Ant. Shoulder   . Blood transfusion without reported diagnosis   . Diabetes mellitus   . Generalized and unspecified atherosclerosis   . GERD (gastroesophageal reflux disease)   . Hydronephrosis of right kidney 06/26/2014    Chronic UPJ stenosis  . Hypertension   . Hypomagnesemia 06/23/2014   And hypocalcemia, hypophosphatemia, and hypokalemia.  . Nodular basal cell carcinoma (BCC) 06/27/2017   Behind Left Ear  . Peripheral arterial disease ([place])    history of known right common iliac artery occlusion status post left iliac stenting.  . Pneumonia    hx  . Pure hypercholesterolemia   . Superficial nodular basal cell carcinoma (BCC) 12/16/2015   Left Ant. Neck  . Superficial nodular basal cell carcinoma (BCC) 06/27/2017   Behing Right Ear  . Swallowing difficulty    gets crumbs caught in throat-told may have to have esophagus streched  . Ulcer   . UTI (lower urinary tract infection)    recent tx     Past Surgical History:  Procedure Laterality Date  . ABDOMINAL HYSTERECTOMY    . ARTERY REPAIR Bilateral    corotid artery surgery  . BLADDER SURGERY     tuck  . CAROTID ENDARTERECTOMY     bilateral CEA 2008; (L) 11/2006, (R) 01/2007 with reperfusion phenomenon (seizure) post-operaitvely  . CHOLECYSTECTOMY    . MULTIPLE EXTRACTIONS WITH ALVEOLOPLASTY N/A 12/07/2013   Procedure: MULTIPLE EXTRACTION WITH ALVEOLOPLASTY;  Surgeon: Gae Bon, DDS;  Location: [facility];  Service: Oral Surgery;  Laterality: N/A;    Current Medications: Outpatient Medications Prior to Visit  Medication Sig Dispense Refill  . acetaminophen (TYLENOL) 325 MG tablet Take 2 tablets (650  mg total) by mouth every 6 (six) hours as needed for mild pain (or Fever >/= 101).    . carvedilol (COREG) 25 MG tablet Take 1 tablet (25 mg total) by mouth 2 (two) times daily with a meal. 60 tablet 11  . cefdinir (OMNICEF) 300 MG capsule Take 1 capsule (300 mg total) by mouth every 12 (twelve) hours. 6 capsule 0  . cilostazol (PLETAL) 50 MG tablet Take 1 tablet (50 mg total) by mouth 2 (two) times daily. 30 tablet 0  . Insulin Glargine (BASAGLAR KWIKPEN) 100 UNIT/ML Inject 0.25 mLs (25 Units total) into the skin daily. Hold if BS<70    . iron  polysaccharides (FERREX 150) 150 MG capsule Take 150 mg by mouth 2 (two) times daily.     . magnesium oxide (MAG-OX) 400 MG tablet Take 1 tablet (400 mg total) by mouth 2 (two) times daily.    Marland Kitchen omeprazole (PRILOSEC) 20 MG capsule Take 1 capsule (20 mg total) by mouth every morning. 30 capsule 11  . ondansetron (ZOFRAN) 4 MG tablet Take 1 tablet (4 mg total) by mouth every 6 (six) hours as needed for nausea. 20 tablet 0  . pravastatin (PRAVACHOL) 80 MG tablet Take 1 tablet (80 mg total) by mouth every morning. 90 tablet 3  . vitamin C (ASCORBIC ACID) 500 MG tablet Take 500 mg by mouth daily.     No facility-administered medications prior to visit.     Allergies:   Red blood cells, Penicillins, and Sulfonamide derivatives   Social History   Socioeconomic History  . Marital status: Single    Spouse name: Not on file  . Number of children: Not on file  . Years of education: Not on file  . Highest education level: Not on file  Occupational History  . Not on file  Tobacco Use  . Smoking status: Former Smoker    Packs/day: 1.00    Years: 56.00    Pack years: 56.00    Types: Cigarettes    Quit date: 11/28/2008    Years since quitting: 10.9  . Smokeless tobacco: Never Used  Substance and Sexual Activity  . Alcohol use: No    Comment: quit  . Drug use: No  . Sexual activity: Not on file  Other Topics Concern  . Not on file  Social History Narrative  . Not on file   Social Determinants of Health   Financial Resource Strain:   . Difficulty of Paying Living Expenses:   Food Insecurity:   . Worried About Charity fundraiser in the Last Year:   . Arboriculturist in the Last Year:   Transportation Needs:   . Film/video editor (Medical):   Marland Kitchen Lack of Transportation (Non-Medical):   Physical Activity:   . Days of Exercise per Week:   . Minutes of Exercise per Session:   Stress:   . Feeling of Stress :   Social Connections:   . Frequency of Communication with Friends and  Family:   . Frequency of Social Gatherings with Friends and Family:   . Attends Religious Services:   . Active Member of Clubs or Organizations:   . Attends Archivist Meetings:   Marland Kitchen Marital Status:      Family History:  The patient's ***family history includes Heart disease in her father.   Review of Systems:   Please see the history of present illness.     General:  No chills, fever, night sweats or  weight changes.  Cardiovascular:  No chest pain, dyspnea on exertion, edema, orthopnea, palpitations, paroxysmal nocturnal dyspnea. Dermatological: No rash, lesions/masses Respiratory: No cough, dyspnea Urologic: No hematuria, dysuria Abdominal:   No nausea, vomiting, diarrhea, bright red blood per rectum, melena, or hematemesis Neurologic:  No visual changes, wkns, changes in mental status. All other systems reviewed and are otherwise negative except as noted above.   Physical Exam:    VS:  There were no vitals taken for this visit.   General: Well developed, well nourished,female appearing in no acute distress. Head: Normocephalic, atraumatic, sclera non-icteric.  Neck: No carotid bruits. JVD not elevated.  Lungs: Respirations regular and unlabored, without wheezes or rales.  Heart: ***Regular rate and rhythm. No S3 or S4.  No murmur, no rubs, or gallops appreciated. Abdomen: Soft, non-tender, non-distended. No obvious abdominal masses. Msk:  Strength and tone appear normal for age. No obvious joint deformities or effusions. Extremities: No clubbing or cyanosis. No edema.  Distal pedal pulses are 2+ bilaterally. Neuro: Alert and oriented X 3. Moves all extremities spontaneously. No focal deficits noted. Psych:  Responds to questions appropriately with a normal affect. Skin: No rashes or lesions noted  Wt Readings from Last 3 Encounters:  10/30/19 138 lb 10.7 oz (62.9 kg)  10/24/19 127 lb 13.9 oz (58 kg)  03/17/18 134 lb (60.8 kg)        Studies/Labs Reviewed:    EKG:  EKG is*** ordered today.  The ekg ordered today demonstrates ***  Recent Labs: 10/31/2019: ALT 13; BUN 11; Creatinine, Ser 0.87; Hemoglobin 10.3; Magnesium 1.2; Platelets 189; Potassium 3.2; Sodium 140   Lipid Panel    Component Value Date/Time   CHOL 139 12/24/2014 0858   TRIG 125 12/24/2014 0858   HDL 49 12/24/2014 0858   CHOLHDL 2.8 12/24/2014 0858   VLDL 25 12/24/2014 0858   LDLCALC 65 12/24/2014 0858    Additional studies/ records that were reviewed today include:   Echocardiogram: 10/30/2019 IMPRESSIONS    1. Left ventricular ejection fraction, by estimation, is 40 to 45%. The  left ventricle has mildly decreased function. The left ventricle  demonstrates regional wall motion abnormalities (see scoring  diagram/findings for description). Left ventricular  diastolic parameters are consistent with Grade I diastolic dysfunction  (impaired relaxation). Elevated left ventricular end-diastolic pressure.  2. Right ventricular systolic function is mildly reduced. The right  ventricular size is normal. There is normal pulmonary artery systolic  pressure.  3. The mitral valve is degenerative. Mild mitral valve regurgitation.  4. The aortic valve is tricuspid. Aortic valve regurgitation is not  visualized. No aortic stenosis is present.  5. The inferior vena cava is normal in size with greater than 50%  respiratory variability, suggesting right atrial pressure of 3 mmHg.   Assessment:    No diagnosis found.   Plan:   In order of problems listed above:  1. ***    Medication Adjustments/Labs and Tests Ordered: Current medicines are reviewed at length with the patient today.  Concerns regarding medicines are outlined above.  Medication changes, Labs and Tests ordered today are listed in the Patient Instructions below. There are no Patient Instructions on file for this visit.   Signed, Erma Heritage, PA-C  11/23/2019 10:02 AM    [facility]. [address] [place], [place] [address] Phone: [phone] Fax: [phone]

## 2019-11-27 ENCOUNTER — Other Ambulatory Visit: Payer: Self-pay | Admitting: Cardiovascular Disease

## 2019-11-28 NOTE — Telephone Encounter (Signed)
Rx request sent to pharmacy.  

## 2019-12-04 ENCOUNTER — Ambulatory Visit (HOSPITAL_COMMUNITY): Admit: 2019-12-04 | Payer: Medicare HMO | Attending: Cardiovascular Disease | Admitting: Cardiovascular Disease

## 2019-12-20 NOTE — H&P (Signed)
[facility] History & Physical  Rob Doctor, hospital MD  Ariana Shea 10/29/2019  Chief Complaint: Presyncope HPI: The patient is a 86 y.o. year-old w/ hx of UTI, CKD 3, HTN, DM2 presented after sustaining near syncope episode after getting off the commode. Also c/o gen weakness.  Ed eval showed low BP's in the 80's which improved w/ NS bolus.  Creat up to 1.7 (creat 0.8 at recent dc 4 d ago). Asked to see for admission.   Pt's main c/o is gen weakness and presyncopal episode this am at the rehab center. Pt was here last week w/ UTI and AKI , grew Klebs pna and has known R hydronephrotic extrarenal pelvis shown by CT scan. She rec'd IV abx,and IVF's and creat 2.7 improved to 0.8 and pt was dc'd to rehab/ SNF locally in [place].    Pt denies any dysuria or urgency/ frequency/ odor.  Does endorse R flank pain , "for years" off and on.  No abd pain , or n/v/d.  Does have loose stools occ. No fevers or chills.  No CP or SOB.   Pt widowed 2 times 2061-09-13, and 14-Sep-1994), had 3 children, her dtr died this 09/14/22 from cancer. One son in [place], other not sure where he is.  Pt not interested in EOL discussion, wants full code.    Pt was just admitted here 5/4- 5/8 for back pain, was found to have UTI/ pyelo. UA showed > 50 wbc. UCx showed klebs pna resistant to amp, mostly sensistive o/w. CT showed chronically dilated R extrarenal pelvis prob d/t UPJ obstruction w/ thickened prox ureters and collecting systems which could be seen w/ ascending UTI.  Creat was 2.7 improved to 9.8 on dc. She rec'd 5 days IV abx and was given another 5 d po keflex upon dc.     ROS  denies CP  no joint pain   no HA  no blurry vision  no rash  no diarrhea  no nausea/ vomiting  no dysuria  no difficulty voiding  no change in urine color   Past Medical History  Past Medical History:  Diagnosis Date  . Abdominal aortic aneurysm ([place]) 06/30/2014   Recommend follow-up ultrasound in 09-13-16.  Marland Kitchen Anemia   .  Arthritis    HANDS  . BCC (basal cell carcinoma of skin) 12/16/2015   Right Ant. Shoulder   . Blood transfusion without reported diagnosis   . Diabetes mellitus   . Generalized and unspecified atherosclerosis   . GERD (gastroesophageal reflux disease)   . Hydronephrosis of right kidney 06/26/2014   Chronic UPJ stenosis  . Hypertension   . Hypomagnesemia 06/23/2014   And hypocalcemia, hypophosphatemia, and hypokalemia.  . Nodular basal cell carcinoma (BCC) 06/27/2017   Behind Left Ear  . Peripheral arterial disease ([place])    history of known right common iliac artery occlusion status post left iliac stenting.  . Pneumonia    hx  . Pure hypercholesterolemia   . Superficial nodular basal cell carcinoma (BCC) 12/16/2015   Left Ant. Neck  . Superficial nodular basal cell carcinoma (BCC) 06/27/2017   Behing Right Ear  . Swallowing difficulty    gets crumbs caught in throat-told may have to have esophagus streched  . Ulcer   . UTI (lower urinary tract infection)    recent tx    Past Surgical History  Past Surgical History:  Procedure Laterality Date  . ABDOMINAL HYSTERECTOMY    . ARTERY REPAIR Bilateral    corotid  artery surgery  . BLADDER SURGERY     tuck  . CAROTID ENDARTERECTOMY     bilateral CEA 2008; (L) 11/2006, (R) 01/2007 with reperfusion phenomenon (seizure) post-operaitvely  . CHOLECYSTECTOMY    . MULTIPLE EXTRACTIONS WITH ALVEOLOPLASTY N/A 12/07/2013   Procedure: MULTIPLE EXTRACTION WITH ALVEOLOPLASTY;  Surgeon: Gae Bon, DDS;  Location: [place];  Service: Oral Surgery;  Laterality: N/A;   Family History  Family History  Problem Relation Age of Onset  . Heart disease Father    Social History  reports that she quit smoking about 11 years ago. Her smoking use included cigarettes. She has a 56.00 pack-year smoking history. She has never used smokeless tobacco. She reports that she does not drink alcohol and does not use drugs. Allergies  Allergies  Allergen Reactions   . Red Blood Cells Other (See Comments)    Patient is a Jehovah's Witness  . Penicillins Rash  . Sulfonamide Derivatives Rash   Home medications Prior to Admission medications   Medication Sig Start Date End Date Taking? Authorizing Provider  carvedilol (COREG) 25 MG tablet Take 1 tablet (25 mg total) by mouth 2 (two) times daily with a meal. 12/11/14  Yes Lorretta Harp, MD  iron polysaccharides (FERREX 150) 150 MG capsule Take 150 mg by mouth 2 (two) times daily.    Yes [provider]  magnesium oxide (MAG-OX) 400 MG tablet Take 1 tablet (400 mg total) by mouth 2 (two) times daily. 06/30/14  Yes Rexene Alberts, MD  omeprazole (PRILOSEC) 20 MG capsule Take 1 capsule (20 mg total) by mouth every morning. 12/11/14  Yes Lorretta Harp, MD  ondansetron (ZOFRAN) 4 MG tablet Take 1 tablet (4 mg total) by mouth every 6 (six) hours as needed for nausea. 10/27/19  Yes Arrien, Jimmy Picket, MD  pravastatin (PRAVACHOL) 80 MG tablet Take 1 tablet (80 mg total) by mouth every morning. 02/25/15  Yes Lorretta Harp, MD  vitamin C (ASCORBIC ACID) 500 MG tablet Take 500 mg by mouth daily.   Yes [provider]  acetaminophen (TYLENOL) 325 MG tablet Take 2 tablets (650 mg total) by mouth every 6 (six) hours as needed for mild pain (or Fever >/= 101). 10/27/19   Arrien, Jimmy Picket, MD  cefdinir (OMNICEF) 300 MG capsule Take 1 capsule (300 mg total) by mouth every 12 (twelve) hours. 10/31/19   Orson Eva, MD  cilostazol (PLETAL) 50 MG tablet TAKE  (1)  TABLET TWICE A DAY. 11/28/19   Lorretta Harp, MD  Insulin Glargine (BASAGLAR KWIKPEN) 100 UNIT/ML Inject 0.25 mLs (25 Units total) into the skin daily. Hold if BS<70 10/31/19   Tat, Shanon Brow, MD       Exam Gen alert, elderly WF, no distress, calm, lying flat No rash, cyanosis or gangrene Sclera anicteric, throat clear  No jvd or bruits Chest clear bilat to bases no rales, wheezing or bronchial BS RRR no MRG Abd soft ntnd no mass or  ascites +bs, no flank tenderness GU defer MS no joint effusions or deformity Ext no LE or UE edema, no wounds or ulcers Neuro is alert, Ox 3 , nf    Home meds:  - coreg 25 bid/ pravachol 80/ pletal 50 bid  - keflex  500 bid x5 days  - insluin glargine 25u bid  - PPI/ Prns/ vitamins     EKG - normal sinus rhythm, 80's no acute  Assessment/ Plan: 1. Presyncope - suspect vol depletion, r/o recurrent UTI. No  rhythm abnormalities here. BP's up after saline bolus in ED. Cont IVF's. No cardiac/ resp issues clinically. Was just here for UTI/ pyelo.  Get I/O cath for UA and culture here. Watch on tele for 12 hrs. Hold abx for now.  2. AKI - creat 1.7. Recurrent was here last week w/ similar issues resolved w/ IV abx for infection and IVF's.  3. CKD 3- baseline creat 1.0 w/ eGFR 45- 60 4. Recent UTI / pyelo - complicated w/ known hydronephrotic extrarenal pelvis on the R. Consider urology input if pt has +UCx again. She got total 10d abx (5 IV, + 5 po) for this recent episode.  5. DM - cover w/ SSI for now until we can reach the SNF to get her current  lantus dose (left on 25u bid recent DC, but unable to reach them today) 6. HL - cont meds 7. HTN - hold coreg, give IVF's, resume BB when needed      Kelly Splinter  MD 10/29/2019, 3:51 PM

## 2020-01-01 ENCOUNTER — Other Ambulatory Visit: Payer: Self-pay | Admitting: Cardiovascular Disease

## 2020-02-13 ENCOUNTER — Encounter: Payer: Self-pay | Admitting: Cardiovascular Disease

## 2020-02-13 ENCOUNTER — Other Ambulatory Visit: Payer: Self-pay

## 2020-02-13 ENCOUNTER — Ambulatory Visit (INDEPENDENT_AMBULATORY_CARE_PROVIDER_SITE_OTHER): Payer: Medicare Other | Admitting: Cardiovascular Disease

## 2020-02-13 DIAGNOSIS — I10 Essential (primary) hypertension: Secondary | ICD-10-CM

## 2020-02-13 DIAGNOSIS — I6523 Occlusion and stenosis of bilateral carotid arteries: Secondary | ICD-10-CM

## 2020-02-13 DIAGNOSIS — E7800 Pure hypercholesterolemia, unspecified: Secondary | ICD-10-CM | POA: Diagnosis not present

## 2020-02-13 DIAGNOSIS — I739 Peripheral vascular disease, unspecified: Secondary | ICD-10-CM

## 2020-02-13 DIAGNOSIS — I714 Abdominal aortic aneurysm, without rupture, unspecified: Secondary | ICD-10-CM

## 2020-02-13 MED ORDER — CILOSTAZOL 50 MG PO TABS: ORAL_TABLET | ORAL | 3 refills | Status: DC

## 2020-02-13 NOTE — Assessment & Plan Note (Signed)
History of hyperlipidemia on statin therapy followed by her PCP. 

## 2020-02-13 NOTE — Assessment & Plan Note (Signed)
History of small abdominal aortic aneurysm with Doppler performed 12/01/2018 measuring 3.6 cm, unchanged from prior readings.

## 2020-02-13 NOTE — Assessment & Plan Note (Signed)
History of essential hypertension with blood pressure measured today 158/90.  She is on carvedilol.

## 2020-02-13 NOTE — Assessment & Plan Note (Signed)
History of peripheral arterial disease with a known occluded right common iliac artery status post remote left iliac stenting with an 8 mm x 27 mm long express balloon expandable stent.  She does complain of right hip claudication.  Her last Doppler studies performed 12/01/2018 again confirmed the right iliac occlusion with a patent left iliac stent.  At this point, I do not feel compelled to pursue her iliac occlusion.

## 2020-02-13 NOTE — Progress Notes (Signed)
02/13/2020 Ariana Shea   05/19/1934  [id number]  Primary Physician Caprice Renshaw, MD Primary Cardiologist: Lorretta Harp MD FACP, [place], [place], [place]  HPI:  Ariana Shea is a 86 y.o.  thin-appearing, divorced Caucasian female, mother of 15, grandmother to 52 grandchildren and 6 step-great-grandchildren who I last sawin the office  10/26/2017.  She has a history of hypertension, hyperlipidemia and diabetes, as well as remote tobacco abuse having quit 6 years ago and smoked 55 pack years. She does have a family history of heart disease in a brother who had a massive myocardial infarction. She has had bilateral carotid endarterectomies performed by Dr. Ruta Hinds in the past after an angiogram done November 24, 2006, revealed high-grade bilateral calcified carotid artery stenosis with a bovine arch. She has a known occluded right common iliac artery stenosis and underwent stenting of her left common iliac with an 8 x 27 mm long Express balloon expandable stent. Carotid Dopplers revealed widely patent endarterectomy sites. She has not had lower extremity Dopplers in quite some time and has complained of progressive right calf claudication which is lifestyle limiting over the last year. She denies chest pain or shortness of breath. Her most recent Dopplers performed 07/10/12 were unchanged with a right ABI 0.53, occluded right common iliac, a left ABI of 0.79 with a patent left common iliac artery stent and with left SFA unchanged from prior studies.  Since I saw her 2 years ago she continues to remain stable.  She denies chest pain or shortness of breath.  She still complains of right hip and lower extremity claudication.  She did have Dopplers performed 12/01/2018 which again showed an occluded right common iliac artery, 3.6 cm, aortic aneurysm and patent left iliac stent.  Current Meds  Medication Sig  . acetaminophen (TYLENOL) 325 MG tablet Take 2 tablets (650 mg total) by mouth every 6 (six) hours as  needed for mild pain (or Fever >/= 101).  . carvedilol (COREG) 25 MG tablet Take 1 tablet (25 mg total) by mouth 2 (two) times daily with a meal.  . Insulin Glargine (BASAGLAR KWIKPEN) 100 UNIT/ML Inject 0.25 mLs (25 Units total) into the skin daily. Hold if BS<70  . magnesium oxide (MAG-OX) 400 MG tablet Take 1 tablet (400 mg total) by mouth 2 (two) times daily.  Marland Kitchen omeprazole (PRILOSEC) 20 MG capsule Take 1 capsule (20 mg total) by mouth every morning.  . pravastatin (PRAVACHOL) 80 MG tablet Take 1 tablet (80 mg total) by mouth every morning.  . trimethoprim (TRIMPEX) 100 MG tablet Take 100 mg by mouth daily.  . vitamin C (ASCORBIC ACID) 500 MG tablet Take 500 mg by mouth daily.     Allergies  Allergen Reactions  . Lisinopril     Other reaction(s): Other (See Comments)  Decreased kidney functions  . Red Blood Cells Other (See Comments)    Patient is a Jehovah's Witness  . Penicillins Rash  . Sulfonamide Derivatives Rash    Social History   Socioeconomic History  . Marital status: Single    Spouse name: Not on file  . Number of children: Not on file  . Years of education: Not on file  . Highest education level: Not on file  Occupational History  . Not on file  Tobacco Use  . Smoking status: Former Smoker    Packs/day: 1.00    Years: 56.00    Pack years: 56.00    Types: Cigarettes  Quit date: 11/28/2008    Years since quitting: 11.2  . Smokeless tobacco: Never Used  Vaping Use  . Vaping Use: Never used  Substance and Sexual Activity  . Alcohol use: No    Comment: quit  . Drug use: No  . Sexual activity: Not on file  Other Topics Concern  . Not on file  Social History Narrative  . Not on file   Social Determinants of Health   Financial Resource Strain:   . Difficulty of Paying Living Expenses: Not on file  Food Insecurity:   . Worried About Charity fundraiser in the Last Year: Not on file  . Ran Out of Food in the Last Year: Not on file  Transportation  Needs:   . Lack of Transportation (Medical): Not on file  . Lack of Transportation (Non-Medical): Not on file  Physical Activity:   . Days of Exercise per Week: Not on file  . Minutes of Exercise per Session: Not on file  Stress:   . Feeling of Stress : Not on file  Social Connections:   . Frequency of Communication with Friends and Family: Not on file  . Frequency of Social Gatherings with Friends and Family: Not on file  . Attends Religious Services: Not on file  . Active Member of Clubs or Organizations: Not on file  . Attends Archivist Meetings: Not on file  . Marital Status: Not on file  Intimate Partner Violence:   . Fear of Current or Ex-Partner: Not on file  . Emotionally Abused: Not on file  . Physically Abused: Not on file  . Sexually Abused: Not on file     Review of Systems: General: negative for chills, fever, night sweats or weight changes.  Cardiovascular: negative for chest pain, dyspnea on exertion, edema, orthopnea, palpitations, paroxysmal nocturnal dyspnea or shortness of breath Dermatological: negative for rash Respiratory: negative for cough or wheezing Urologic: negative for hematuria Abdominal: negative for nausea, vomiting, diarrhea, bright red blood per rectum, melena, or hematemesis Neurologic: negative for visual changes, syncope, or dizziness All other systems reviewed and are otherwise negative except as noted above.    Blood pressure (!) 158/90, pulse 74, height 5\' 3"  (1.6 m), weight 131 lb 14.4 oz (59.8 kg), SpO2 92 %.  General appearance: alert and no distress Neck: no adenopathy, no carotid bruit, no JVD, supple, symmetrical, trachea midline and thyroid not enlarged, symmetric, no tenderness/mass/nodules Lungs: clear to auscultation bilaterally Heart: regular rate and rhythm, S1, S2 normal, no murmur, click, rub or gallop Extremities: extremities normal, atraumatic, no cyanosis or edema Pulses: Diminished pedal pulses Skin: Skin  color, texture, turgor normal. No rashes or lesions Neurologic: Alert and oriented X 3, normal strength and tone. Normal symmetric reflexes. Normal coordination and gait  EKG sinus rhythm at 74 with nonspecific ST and T wave changes.  I personally reviewed this EKG.  ASSESSMENT AND PLAN:   Hypertension History of essential hypertension with blood pressure measured today 158/90.  She is on carvedilol.  PVD (peripheral vascular disease) ([place]) History of peripheral arterial disease with a known occluded right common iliac artery status post remote left iliac stenting with an 8 mm x 27 mm long express balloon expandable stent.  She does complain of right hip claudication.  Her last Doppler studies performed 12/01/2018 again confirmed the right iliac occlusion with a patent left iliac stent.  At this point, I do not feel compelled to pursue her iliac occlusion.  Pure hypercholesterolemia History  of hyperlipidemia on statin therapy followed by her PCP  Carotid artery disease History of carotid artery disease status post bilateral carotid endarterectomies followed by Dr. Ruta Hinds .  Abdominal aortic aneurysm History of small abdominal aortic aneurysm with Doppler performed 12/01/2018 measuring 3.6 cm, unchanged from prior readings.      Lorretta Harp MD FACP,FACC,FAHA, [facility] 02/13/2020 4:09 PM

## 2020-02-13 NOTE — Patient Instructions (Signed)

## 2020-02-13 NOTE — Assessment & Plan Note (Signed)
History of carotid artery disease status post bilateral carotid endarterectomies followed by Dr. Ruta Hinds .

## 2020-11-10 ENCOUNTER — Encounter (HOSPITAL_COMMUNITY): Payer: Self-pay | Admitting: *Deleted

## 2020-11-10 ENCOUNTER — Other Ambulatory Visit: Payer: Self-pay

## 2020-11-10 ENCOUNTER — Emergency Department (HOSPITAL_COMMUNITY): Payer: Medicare Other

## 2020-11-10 ENCOUNTER — Inpatient Hospital Stay (HOSPITAL_COMMUNITY)
Admission: EM | Admit: 2020-11-10 | Discharge: 2020-11-16 | DRG: 372 | Disposition: A | Discharge status: Home Health | Payer: Medicare Other | Attending: Family Medicine | Admitting: Family Medicine

## 2020-11-10 DIAGNOSIS — E782 Mixed hyperlipidemia: Secondary | ICD-10-CM

## 2020-11-10 DIAGNOSIS — Z888 Allergy status to other drugs, medicaments and biological substances status: Secondary | ICD-10-CM | POA: Diagnosis not present

## 2020-11-10 DIAGNOSIS — R1031 Right lower quadrant pain: Secondary | ICD-10-CM | POA: Diagnosis not present

## 2020-11-10 DIAGNOSIS — K529 Noninfective gastroenteritis and colitis, unspecified: Secondary | ICD-10-CM | POA: Diagnosis present

## 2020-11-10 DIAGNOSIS — A049 Bacterial intestinal infection, unspecified: Secondary | ICD-10-CM | POA: Diagnosis present

## 2020-11-10 DIAGNOSIS — M19041 Primary osteoarthritis, right hand: Secondary | ICD-10-CM | POA: Diagnosis present

## 2020-11-10 DIAGNOSIS — Z794 Long term (current) use of insulin: Secondary | ICD-10-CM

## 2020-11-10 DIAGNOSIS — Z85828 Personal history of other malignant neoplasm of skin: Secondary | ICD-10-CM

## 2020-11-10 DIAGNOSIS — Z880 Allergy status to penicillin: Secondary | ICD-10-CM | POA: Diagnosis not present

## 2020-11-10 DIAGNOSIS — Z87891 Personal history of nicotine dependence: Secondary | ICD-10-CM | POA: Diagnosis not present

## 2020-11-10 DIAGNOSIS — E785 Hyperlipidemia, unspecified: Secondary | ICD-10-CM | POA: Diagnosis present

## 2020-11-10 DIAGNOSIS — Z8249 Family history of ischemic heart disease and other diseases of the circulatory system: Secondary | ICD-10-CM

## 2020-11-10 DIAGNOSIS — E860 Dehydration: Secondary | ICD-10-CM

## 2020-11-10 DIAGNOSIS — Z87440 Personal history of urinary (tract) infections: Secondary | ICD-10-CM | POA: Diagnosis not present

## 2020-11-10 DIAGNOSIS — Z882 Allergy status to sulfonamides status: Secondary | ICD-10-CM | POA: Diagnosis not present

## 2020-11-10 DIAGNOSIS — I110 Hypertensive heart disease with heart failure: Secondary | ICD-10-CM | POA: Diagnosis present

## 2020-11-10 DIAGNOSIS — I10 Essential (primary) hypertension: Secondary | ICD-10-CM

## 2020-11-10 DIAGNOSIS — M19042 Primary osteoarthritis, left hand: Secondary | ICD-10-CM | POA: Diagnosis present

## 2020-11-10 DIAGNOSIS — K219 Gastro-esophageal reflux disease without esophagitis: Secondary | ICD-10-CM | POA: Diagnosis present

## 2020-11-10 DIAGNOSIS — E1151 Type 2 diabetes mellitus with diabetic peripheral angiopathy without gangrene: Secondary | ICD-10-CM | POA: Diagnosis present

## 2020-11-10 DIAGNOSIS — D72829 Elevated white blood cell count, unspecified: Secondary | ICD-10-CM

## 2020-11-10 DIAGNOSIS — R52 Pain, unspecified: Secondary | ICD-10-CM

## 2020-11-10 DIAGNOSIS — I714 Abdominal aortic aneurysm, without rupture, unspecified: Secondary | ICD-10-CM | POA: Diagnosis present

## 2020-11-10 DIAGNOSIS — E1165 Type 2 diabetes mellitus with hyperglycemia: Secondary | ICD-10-CM | POA: Diagnosis present

## 2020-11-10 DIAGNOSIS — Z79899 Other long term (current) drug therapy: Secondary | ICD-10-CM

## 2020-11-10 DIAGNOSIS — E7800 Pure hypercholesterolemia, unspecified: Secondary | ICD-10-CM | POA: Diagnosis present

## 2020-11-10 DIAGNOSIS — R112 Nausea with vomiting, unspecified: Secondary | ICD-10-CM | POA: Diagnosis not present

## 2020-11-10 DIAGNOSIS — I5042 Chronic combined systolic (congestive) and diastolic (congestive) heart failure: Secondary | ICD-10-CM | POA: Diagnosis present

## 2020-11-10 DIAGNOSIS — M542 Cervicalgia: Secondary | ICD-10-CM | POA: Diagnosis not present

## 2020-11-10 DIAGNOSIS — Z8701 Personal history of pneumonia (recurrent): Secondary | ICD-10-CM | POA: Diagnosis not present

## 2020-11-10 DIAGNOSIS — R109 Unspecified abdominal pain: Secondary | ICD-10-CM

## 2020-11-10 DIAGNOSIS — Z90710 Acquired absence of both cervix and uterus: Secondary | ICD-10-CM

## 2020-11-10 DIAGNOSIS — Z20822 Contact with and (suspected) exposure to covid-19: Secondary | ICD-10-CM | POA: Diagnosis present

## 2020-11-10 LAB — CBC WITH DIFFERENTIAL/PLATELET
Abs Immature Granulocytes: 0.06 10*3/uL (ref 0.00–0.07)
Basophils Absolute: 0 10*3/uL (ref 0.0–0.1)
Basophils Relative: 0 %
Eosinophils Absolute: 0 10*3/uL (ref 0.0–0.5)
Eosinophils Relative: 0 %
HCT: 40.1 % (ref 36.0–46.0)
Hemoglobin: 13.1 g/dL (ref 12.0–15.0)
Immature Granulocytes: 0 %
Lymphocytes Relative: 6 %
Lymphs Abs: 1 10*3/uL (ref 0.7–4.0)
MCH: 30.8 pg (ref 26.0–34.0)
MCHC: 32.7 g/dL (ref 30.0–36.0)
MCV: 94.4 fL (ref 80.0–100.0)
Monocytes Absolute: 1.3 10*3/uL — ABNORMAL HIGH (ref 0.1–1.0)
Monocytes Relative: 8 %
Neutro Abs: 13.5 10*3/uL — ABNORMAL HIGH (ref 1.7–7.7)
Neutrophils Relative %: 86 %
Platelets: 185 10*3/uL (ref 150–400)
RBC: 4.25 MIL/uL (ref 3.87–5.11)
RDW: 11.9 % (ref 11.5–15.5)
WBC: 15.9 10*3/uL — ABNORMAL HIGH (ref 4.0–10.5)
nRBC: 0 % (ref 0.0–0.2)

## 2020-11-10 LAB — URINALYSIS, ROUTINE W REFLEX MICROSCOPIC
Bilirubin Urine: NEGATIVE
Glucose, UA: NEGATIVE mg/dL
Hgb urine dipstick: NEGATIVE
Ketones, ur: 5 mg/dL — AB
Nitrite: NEGATIVE
Protein, ur: NEGATIVE mg/dL
Specific Gravity, Urine: 1.016 (ref 1.005–1.030)
pH: 5 (ref 5.0–8.0)

## 2020-11-10 LAB — COMPREHENSIVE METABOLIC PANEL
ALT: 12 U/L (ref 0–44)
AST: 15 U/L (ref 15–41)
Albumin: 4.1 g/dL (ref 3.5–5.0)
Alkaline Phosphatase: 65 U/L (ref 38–126)
Anion gap: 7 (ref 5–15)
BUN: 28 mg/dL — ABNORMAL HIGH (ref 8–23)
CO2: 25 mmol/L (ref 22–32)
Calcium: 9.7 mg/dL (ref 8.9–10.3)
Chloride: 104 mmol/L (ref 98–111)
Creatinine, Ser: 1.13 mg/dL — ABNORMAL HIGH (ref 0.44–1.00)
GFR, Estimated: 48 mL/min — ABNORMAL LOW (ref >60)
Glucose, Bld: 183 mg/dL — ABNORMAL HIGH (ref 70–99)
Potassium: 4.1 mmol/L (ref 3.5–5.1)
Sodium: 136 mmol/L (ref 135–145)
Total Bilirubin: 0.7 mg/dL (ref 0.3–1.2)
Total Protein: 6.8 g/dL (ref 6.5–8.1)

## 2020-11-10 LAB — RESP PANEL BY RT-PCR (FLU A&B, COVID) ARPGX2
Influenza A by PCR: NEGATIVE
Influenza B by PCR: NEGATIVE
SARS Coronavirus 2 by RT PCR: NEGATIVE

## 2020-11-10 LAB — LIPASE, BLOOD: Lipase: 37 U/L (ref 11–51)

## 2020-11-10 MED ORDER — METRONIDAZOLE 500 MG/100ML IV SOLN
500.0000 mg | Freq: Once | INTRAVENOUS | Status: AC
  Administered 2020-11-10: 500 mg via INTRAVENOUS
  Filled 2020-11-10: qty 100

## 2020-11-10 MED ORDER — FENTANYL CITRATE (PF) 100 MCG/2ML IJ SOLN
50.0000 ug | Freq: Once | INTRAMUSCULAR | Status: AC
  Administered 2020-11-10: 50 ug via INTRAVENOUS
  Filled 2020-11-10: qty 2

## 2020-11-10 MED ORDER — SODIUM CHLORIDE 0.9 % IV BOLUS
500.0000 mL | Freq: Once | INTRAVENOUS | Status: AC
  Administered 2020-11-10: 500 mL via INTRAVENOUS

## 2020-11-10 MED ORDER — MORPHINE SULFATE (PF) 4 MG/ML IV SOLN
4.0000 mg | Freq: Once | INTRAVENOUS | Status: AC
  Administered 2020-11-10: 4 mg via INTRAVENOUS
  Filled 2020-11-10: qty 1

## 2020-11-10 MED ORDER — SODIUM CHLORIDE 0.9 % IV SOLN
2.0000 g | Freq: Once | INTRAVENOUS | Status: AC
  Administered 2020-11-10: 2 g via INTRAVENOUS
  Filled 2020-11-10: qty 20

## 2020-11-10 MED ORDER — ONDANSETRON HCL 4 MG/2ML IJ SOLN
4.0000 mg | Freq: Once | INTRAMUSCULAR | Status: AC
  Administered 2020-11-10: 4 mg via INTRAVENOUS
  Filled 2020-11-10: qty 2

## 2020-11-10 NOTE — H&P (Signed)
History and Physical  Ariana Shea OAC:[id number] DOB: [birth year] DOA: 11/10/2020  Referring physician: Truddie Hidden, MD  PCP: Ariana Shea, [facility]  Patient coming from: Home   Chief Complaint: Abdominal pain  HPI: Ariana Shea is a 87 y.o. female with medical history significant for hypertension, hyperlipidemia, GERD who presents to the emergency department due to right lower quadrant abdominal pain which started last night.  Patient states that she lifted some garbage in the afternoon yesterday, so when she started to feel the pain late last night/early this morning, she initially thought that she might have pulled some muscle when lifting the garbage.  Pain worsened on waking up this morning and was rated as 8/10 on pain scale, this was associated with nausea and vomiting x 1.  No alleviating/aggravating factors are known.  Patient endorsed prior history of cholecystectomy and hysterectomy.  She denies chest pain, shortness of breath, fever, chills, diarrhea.  ED Course:  In the emergency department, she was hemodynamically stable.  Work-up in the ED showed normal CBC except for leukocytosis.  BMP showed BUN/creatinine 28/1.13 and hyperglycemia.  Lipase 37.  Urinalysis was unimpressive for UTI.  Influenza A, B and SARS coronavirus 2 was negative. CT abdomen and pelvis without contrast showed colitis involving the cecum and proximal ascending colon, likely infectious in etiology.  No bowel obstruction.  Normal appendix. She was treated with IV ceftriaxone and metronidazole, pain medication was given, IV Zofran and IV hydration was provided.  Hospitalist was asked to admit patient for further evaluation and management.  Review of Systems:  Constitutional: Negative for chills and fever.  HENT: Negative for ear pain and sore throat.   Eyes: Negative for pain and visual disturbance.  Respiratory: Negative for cough, chest tightness and shortness of breath.    Cardiovascular: Negative for chest pain and palpitations.  Gastrointestinal: Positive for abdominal pain, nausea and vomiting.  Endocrine: Negative for polyphagia and polyuria.  Genitourinary: Negative for decreased urine volume, dysuria, enuresis Musculoskeletal: Negative for arthralgias and back pain.  Skin: Negative for color change and rash.  Allergic/Immunologic: Negative for immunocompromised state.  Neurological: Negative for tremors, syncope, speech difficulty, weakness, light-headedness and headaches.  Hematological: Does not bruise/bleed easily.  All other systems reviewed and are negative   Past Medical History:  Diagnosis Date  . Abdominal aortic aneurysm ([place]) 06/30/2014   Recommend follow-up ultrasound in 2018.  Ariana Shea Anemia   . Arthritis    HANDS  . BCC (basal cell carcinoma of skin) 12/16/2015   Right Ant. Shoulder   . Blood transfusion without reported diagnosis   . Diabetes mellitus   . Generalized and unspecified atherosclerosis   . GERD (gastroesophageal reflux disease)   . Hydronephrosis of right kidney 06/26/2014   Chronic UPJ stenosis  . Hypertension   . Hypomagnesemia 06/23/2014   And hypocalcemia, hypophosphatemia, and hypokalemia.  . Nodular basal cell carcinoma (BCC) 06/27/2017   Behind Left Ear  . Peripheral arterial disease ([place])    history of known right common iliac artery occlusion status post left iliac stenting.  . Pneumonia    hx  . Pure hypercholesterolemia   . Superficial nodular basal cell carcinoma (BCC) 12/16/2015   Left Ant. Neck  . Superficial nodular basal cell carcinoma (BCC) 06/27/2017   Behing Right Ear  . Swallowing difficulty    gets crumbs caught in throat-told may have to have esophagus streched  . Ulcer   . UTI (lower urinary tract infection)    recent  tx    Past Surgical History:  Procedure Laterality Date  . ABDOMINAL HYSTERECTOMY    . ARTERY REPAIR Bilateral    corotid artery surgery  . BLADDER SURGERY     tuck  .  CAROTID ENDARTERECTOMY     bilateral CEA 2008; (L) 11/2006, (R) 01/2007 with reperfusion phenomenon (seizure) post-operaitvely  . CHOLECYSTECTOMY    . MULTIPLE EXTRACTIONS WITH ALVEOLOPLASTY N/A 12/07/2013   Procedure: MULTIPLE EXTRACTION WITH ALVEOLOPLASTY;  Surgeon: [place] LopesScott M Jensen, DDS;  Location: [facility] OR;  Service: Oral Surgery;  Laterality: N/A;    Social History:  reports that she quit smoking about 11 years ago. Her smoking use included cigarettes. She has a 56.00 pack-year smoking history. She has never used smokeless tobacco. She reports that she does not drink alcohol and does not use drugs.   Allergies  Allergen Reactions  . Lisinopril     Other reaction(s): Other (See Comments)  Decreased kidney functions  . Red Blood Cells Other (See Comments)    Patient is a Jehovah's Witness  . Penicillins Rash  . Sulfonamide Derivatives Rash    Family History  Problem Relation Age of Onset  . Heart disease Father      Prior to Admission medications   Medication Sig Start Date End Date Taking? Authorizing Provider  acetaminophen (TYLENOL) 325 MG tablet Take 2 tablets (650 mg total) by mouth every 6 (six) hours as needed for mild pain (or Fever >/= 101). 10/27/19   Arrien, Ariana RamMauricio Daniel, MD  amLODipine (NORVASC) 5 MG tablet Take 1 tablet by mouth daily. 08/18/20   [provider]  carvedilol (COREG) 25 MG tablet Take 1 tablet (25 mg total) by mouth 2 (two) times daily with a meal. 12/11/14   Ariana Shea, Jonathan J, MD  cilostazol (PLETAL) 50 MG tablet TAKE  (1)  TABLET TWICE A DAY. 02/13/20   Ariana Shea, Jonathan J, MD  Insulin Glargine (BASAGLAR KWIKPEN) 100 UNIT/ML Inject 0.25 mLs (25 Units total) into the skin daily. Hold if BS<70 10/31/19   Tat, Onalee Huaavid, MD  magnesium oxide (MAG-OX) 400 MG tablet Take 1 tablet (400 mg total) by mouth 2 (two) times daily. 06/30/14   Elliot CousinFisher, Denise, MD  moxifloxacin (VIGAMOX) 0.5 % ophthalmic solution Place into the right eye. 11/06/20   [provider]   omeprazole (PRILOSEC) 20 MG capsule Take 1 capsule (20 mg total) by mouth every morning. 12/11/14   Ariana Shea, Jonathan J, MD  pravastatin (PRAVACHOL) 80 MG tablet Take 1 tablet (80 mg total) by mouth every morning. 02/25/15   Ariana Shea, Jonathan J, MD  triamcinolone ointment (KENALOG) 0.1 % Apply 1 application topically 2 (two) times daily. 08/18/20   [provider]  trimethoprim (TRIMPEX) 100 MG tablet Take 100 mg by mouth daily. 01/24/20   [provider]  vitamin C (ASCORBIC ACID) 500 MG tablet Take 500 mg by mouth daily.    [provider]    Physical Exam: BP (!) 110/52 (BP Location: Right Arm)   Pulse 91   Temp 98.2 F (36.8 C)   Resp 19   Ht 5\' 3"  (1.6 m)   Wt 70 kg   SpO2 96%   BMI 27.34 kg/m   . General: 87 y.o. year-old female well developed well nourished in no acute distress.  Alert and oriented x3. Ariana Shea. HEENT: Dry mucous membrane.  NCAT, EOMI . Neck: Supple, trachea medial . Cardiovascular: Regular rate and rhythm with no rubs or gallops.  No thyromegaly or JVD noted.  No  lower extremity edema. 2/4 pulses in all 4 extremities. Ariana Shea Respiratory: Clear to auscultation with no wheezes or rales. Good inspiratory effort. . Abdomen: Soft, tender to palpation of RLQ with guarding.  Normal bowel sounds x4 quadrants. . Muskuloskeletal: No cyanosis, clubbing or edema noted bilaterally . Neuro: CN II-XII intact, strength 5/5 x 4, sensation, reflexes intact . Skin: No ulcerative lesions noted or rashes . Psychiatry: Judgement and insight appear normal. Mood is appropriate for condition and setting          Labs on Admission:  Basic Metabolic Panel: Recent Labs  Lab 11/10/20 1800  NA 136  K 4.1  CL 104  CO2 25  GLUCOSE 183*  BUN 28*  CREATININE 1.13*  CALCIUM 9.7   Liver Function Tests: Recent Labs  Lab 11/10/20 1800  AST 15  ALT 12  ALKPHOS 65  BILITOT 0.7  PROT 6.8  ALBUMIN 4.1   Recent Labs  Lab 11/10/20 1800  LIPASE 37   No results for  input(s): AMMONIA in the last 168 hours. CBC: Recent Labs  Lab 11/10/20 1800  WBC 15.9*  NEUTROABS 13.5*  HGB 13.1  HCT 40.1  MCV 94.4  PLT 185   Cardiac Enzymes: No results for input(s): CKTOTAL, CKMB, CKMBINDEX, TROPONINI in the last 168 hours.  BNP (last 3 results) No results for input(s): BNP in the last 8760 hours.  ProBNP (last 3 results) No results for input(s): PROBNP in the last 8760 hours.  CBG: No results for input(s): GLUCAP in the last 168 hours.  Radiological Exams on Admission: CT Abdomen Pelvis Wo Contrast  Result Date: 11/10/2020 CLINICAL DATA:  87 year old female with right lower quadrant abdominal pain. EXAM: CT ABDOMEN AND PELVIS WITHOUT CONTRAST TECHNIQUE: Multidetector CT imaging of the abdomen and pelvis was performed following the standard protocol without IV contrast. COMPARISON:  CT abdomen pelvis dated 10/24/2019. FINDINGS: Evaluation of this exam is limited in the absence of intravenous contrast. Lower chest: Minimal bibasilar linear atelectasis/scarring. The visualized lung bases are otherwise clear. There is coronary vascular calcification and calcification of the mitral annulus. No intra-abdominal free air or free fluid. Hepatobiliary: The liver is unremarkable. No intrahepatic biliary dilatation. Cholecystectomy. No retained calcified stone noted in the central CBD. Pancreas: Unremarkable. No pancreatic ductal dilatation or surrounding inflammatory changes. Spleen: Small calcified splenic granuloma. The spleen is otherwise unremarkable. Adrenals/Urinary Tract: The adrenal glands are unremarkable. Several scattered vascular calcification versus small nonobstructing renal calculi measure up to 3 mm. There is no hydronephrosis on either side. The visualized ureters and urinary bladder appear unremarkable. Stomach/Bowel: There is severe sigmoid diverticulosis without active inflammatory changes. There is a 4 cm duodenal diverticulum without active inflammation.  There is inflammatory changes involving the cecum and proximal ascending colon most consistent with colitis, likely infectious in etiology. Underlying mass is less likely. Clinical correlation and follow-up recommended. There is no bowel obstruction. The appendix is normal. Vascular/Lymphatic: There is advanced aortoiliac atherosclerotic disease. A fusiform infrarenal abdominal aortic aneurysm measures up to 3.9 cm in diameter similar to prior CT. Evaluation of the aorta and vasculature is limited in the absence of intravenous contrast. The IVC is unremarkable. No portal venous gas. There is no adenopathy. Reproductive: Hysterectomy. A pessary is noted in the pelvis. Other: None Musculoskeletal: Osteopenia with degenerative changes of the spine. No acute osseous pathology. IMPRESSION: 1. Colitis involving the cecum and proximal ascending colon, likely infectious in etiology. Clinical correlation and follow-up recommended. No bowel obstruction. Normal appendix. 2. Severe sigmoid diverticulosis.  3. Fusiform infrarenal abdominal aortic aneurysm measuring up to 3.9 cm in diameter similar to prior CT. Recommend follow-up ultrasound every 2 years. This recommendation follows ACR consensus guidelines: White Paper of the ACR Incidental Findings Committee II on Vascular Findings. J Am Coll Radiol 2013; 16:606-301. 4. Aortic Atherosclerosis (ICD10-I70.0). Electronically Signed   By: Anner Crete M.D.   On: 11/10/2020 20:37    EKG: I independently viewed the EKG done and my findings are as followed: No EKG was done in the ED  Assessment/Plan Present on Admission: . Acute colitis . Dehydration . Abdominal aortic aneurysm ([place]) . Hypertension . GERD (gastroesophageal reflux disease) . Hyperlipidemia  Principal Problem:   Acute colitis Active Problems:   Hypertension   GERD (gastroesophageal reflux disease)   Abdominal aortic aneurysm ([facility])   Hyperlipidemia   Dehydration   Abdominal pain    Leukocytosis   Abdominal pain, nausea and vomiting due to acute colitis Continue IV NS at 75 mLs/Hr Patient was started on IV ceftriaxone and metronidazole, we shall continue with IV levofloxacin Continue IV morphine 2 mg q.4h p.r.n. for moderate to severe pain Continue IV Zofran p.r.n. Continue clear liquid diet with plan to advance diet as tolerated Obtain blood culture x2 Procalcitonin will be checked Consider surgical consult for worsening of symptoms to rule out ischemic colitis  Leukocytosis possible secondary to above vs reactive Continue treatment as described above  Dehydration Continue IV hydration  Essential pretension Continue Norvasc and Coreg  GERD Continue Protonix  Hyperlipidemia Continue pravastatin  Hyperglycemia secondary to T2DM Continue sliding scale insulin and hypoglycemic protocol Continue Lantus 10 units daily  Abdominal aortic aneurysm CT abdomen and pelvis showed fusiform infrarenal abdominal aortic aneurysm measuring up to 3.9 cm in diameter similar to prior CT Follow-up ultrasound every 2 years recommended    DVT prophylaxis: Lovenox  Code Status: Full code  Family Communication: None at bedside  Disposition Plan:  Patient is from:                        home Anticipated DC to:                   SNF or family members home Anticipated DC date:               2-3 days Anticipated DC barriers:         patient requires inpatient management due to abdominal pain secondary to colitis  Consults called: None  Admission status: Inpatient    Bernadette Hoit MD [facility]  11/11/2020, 4:54 AM

## 2020-11-10 NOTE — ED Notes (Signed)
Pt ambulatory to restroom

## 2020-11-10 NOTE — ED Triage Notes (Signed)
Pain in right lower quadrant, states she took out the garbage yesterday and pulled something in her side

## 2020-11-10 NOTE — ED Provider Notes (Signed)
[facility] Provider Note  CSN: [id number] Arrival date & time: 11/10/20 1526    History Chief Complaint  Patient presents with  . Abdominal Pain    HPI  Ariana Shea is a 87 y.o. female presents for evaluation of RLQ abdominal pani, began last night. She reports she was lifting up some garbage earlier in the day yesterday and thinks she may have pulled something although the pain did not start right away. She had one episode of nausea and spitting up phlegm during the night. Pain has progressed today. She has not had any diarrhea, reports one stool that may have had some blood in it. Denies any urinary symptom. She has had cholecystectomy in the past. Also had hysterectomy >50 years ago but is not sure if they took out her appendix then or not.    Past Medical History:  Diagnosis Date  . Abdominal aortic aneurysm ([place]) 06/30/2014   Recommend follow-up ultrasound in 2018.  Marland Kitchen Anemia   . Arthritis    HANDS  . BCC (basal cell carcinoma of skin) 12/16/2015   Right Ant. Shoulder   . Blood transfusion without reported diagnosis   . Diabetes mellitus   . Generalized and unspecified atherosclerosis   . GERD (gastroesophageal reflux disease)   . Hydronephrosis of right kidney 06/26/2014   Chronic UPJ stenosis  . Hypertension   . Hypomagnesemia 06/23/2014   And hypocalcemia, hypophosphatemia, and hypokalemia.  . Nodular basal cell carcinoma (BCC) 06/27/2017   Behind Left Ear  . Peripheral arterial disease ([place])    history of known right common iliac artery occlusion status post left iliac stenting.  . Pneumonia    hx  . Pure hypercholesterolemia   . Superficial nodular basal cell carcinoma (BCC) 12/16/2015   Left Ant. Neck  . Superficial nodular basal cell carcinoma (BCC) 06/27/2017   Behing Right Ear  . Swallowing difficulty    gets crumbs caught in throat-told may have to have esophagus streched  . Ulcer   . UTI (lower urinary tract infection)    recent  tx     Past Surgical History:  Procedure Laterality Date  . ABDOMINAL HYSTERECTOMY    . ARTERY REPAIR Bilateral    corotid artery surgery  . BLADDER SURGERY     tuck  . CAROTID ENDARTERECTOMY     bilateral CEA 2008; (L) 11/2006, (R) 01/2007 with reperfusion phenomenon (seizure) post-operaitvely  . CHOLECYSTECTOMY    . MULTIPLE EXTRACTIONS WITH ALVEOLOPLASTY N/A 12/07/2013   Procedure: MULTIPLE EXTRACTION WITH ALVEOLOPLASTY;  Surgeon: Gae Bon, DDS;  Location: [facility];  Service: Oral Surgery;  Laterality: N/A;    Family History  Problem Relation Age of Onset  . Heart disease Father     Social History   Tobacco Use  . Smoking status: Former Smoker    Packs/day: 1.00    Years: 56.00    Pack years: 56.00    Types: Cigarettes    Quit date: 11/28/2008    Years since quitting: 11.9  . Smokeless tobacco: Never Used  Vaping Use  . Vaping Use: Never used  Substance Use Topics  . Alcohol use: No    Comment: quit  . Drug use: No     Home Medications Prior to Admission medications   Medication Sig Start Date End Date Taking? Authorizing Provider  acetaminophen (TYLENOL) 325 MG tablet Take 2 tablets (650 mg total) by mouth every 6 (six) hours as needed for mild pain (or Fever >/= 101).  10/27/19   Arrien, Jimmy Picket, MD  amLODipine (NORVASC) 5 MG tablet Take 1 tablet by mouth daily. 08/18/20   [provider]  carvedilol (COREG) 25 MG tablet Take 1 tablet (25 mg total) by mouth 2 (two) times daily with a meal. 12/11/14   Lorretta Harp, MD  cilostazol (PLETAL) 50 MG tablet TAKE  (1)  TABLET TWICE A DAY. 02/13/20   Lorretta Harp, MD  Insulin Glargine (BASAGLAR KWIKPEN) 100 UNIT/ML Inject 0.25 mLs (25 Units total) into the skin daily. Hold if BS<70 10/31/19   Tat, Shanon Brow, MD  magnesium oxide (MAG-OX) 400 MG tablet Take 1 tablet (400 mg total) by mouth 2 (two) times daily. 06/30/14   Rexene Alberts, MD  moxifloxacin (VIGAMOX) 0.5 % ophthalmic solution Place into the  right eye. 11/06/20   [provider]  omeprazole (PRILOSEC) 20 MG capsule Take 1 capsule (20 mg total) by mouth every morning. 12/11/14   Lorretta Harp, MD  pravastatin (PRAVACHOL) 80 MG tablet Take 1 tablet (80 mg total) by mouth every morning. 02/25/15   Lorretta Harp, MD  triamcinolone ointment (KENALOG) 0.1 % Apply 1 application topically 2 (two) times daily. 08/18/20   [provider]  trimethoprim (TRIMPEX) 100 MG tablet Take 100 mg by mouth daily. 01/24/20   [provider]  vitamin C (ASCORBIC ACID) 500 MG tablet Take 500 mg by mouth daily.    [provider]     Allergies    Lisinopril, Red blood cells, Penicillins, and Sulfonamide derivatives   Review of Systems   Review of Systems A comprehensive review of systems was completed and negative except as noted in HPI.    Physical Exam BP 109/60   Pulse 79   Temp 98.2 F (36.8 C) (Oral)   Resp 16   Ht 5\' 3"  (1.6 m)   Wt 70 kg   SpO2 91%   BMI 27.34 kg/m   Physical Exam Vitals and nursing note reviewed.  Constitutional:      Appearance: Normal appearance.  HENT:     Head: Normocephalic and atraumatic.     Nose: Nose normal.     Mouth/Throat:     Mouth: Mucous membranes are moist.  Eyes:     Extraocular Movements: Extraocular movements intact.     Conjunctiva/sclera: Conjunctivae normal.  Cardiovascular:     Rate and Rhythm: Normal rate.  Pulmonary:     Effort: Pulmonary effort is normal.     Breath sounds: Normal breath sounds.  Abdominal:     General: Abdomen is flat.     Palpations: Abdomen is soft.     Tenderness: There is abdominal tenderness in the right lower quadrant. There is guarding. Positive signs include McBurney's sign. Negative signs include Murphy's sign.  Musculoskeletal:        General: No swelling. Normal range of motion.     Cervical back: Neck supple.  Skin:    General: Skin is warm and dry.  Neurological:     General: No focal deficit present.      Mental Status: She is alert.  Psychiatric:        Mood and Affect: Mood normal.      ED Results / Procedures / Treatments   Labs (all labs ordered are listed, but only abnormal results are displayed) Labs Reviewed  COMPREHENSIVE METABOLIC PANEL - Abnormal; Notable for the following components:      Result Value   Glucose, Bld 183 (*)  BUN 28 (*)    Creatinine, Ser 1.13 (*)    GFR, Estimated 48 (*)    All other components within normal limits  CBC WITH DIFFERENTIAL/PLATELET - Abnormal; Notable for the following components:   WBC 15.9 (*)    Neutro Abs 13.5 (*)    Monocytes Absolute 1.3 (*)    All other components within normal limits  URINALYSIS, ROUTINE W REFLEX MICROSCOPIC - Abnormal; Notable for the following components:   APPearance HAZY (*)    Ketones, ur 5 (*)    Leukocytes,Ua MODERATE (*)    Bacteria, UA FEW (*)    All other components within normal limits  RESP PANEL BY RT-PCR (FLU A&B, COVID) ARPGX2  LIPASE, BLOOD    EKG None  Radiology CT Abdomen Pelvis Wo Contrast  Result Date: 11/10/2020 CLINICAL DATA:  87 year old female with right lower quadrant abdominal pain. EXAM: CT ABDOMEN AND PELVIS WITHOUT CONTRAST TECHNIQUE: Multidetector CT imaging of the abdomen and pelvis was performed following the standard protocol without IV contrast. COMPARISON:  CT abdomen pelvis dated 10/24/2019. FINDINGS: Evaluation of this exam is limited in the absence of intravenous contrast. Lower chest: Minimal bibasilar linear atelectasis/scarring. The visualized lung bases are otherwise clear. There is coronary vascular calcification and calcification of the mitral annulus. No intra-abdominal free air or free fluid. Hepatobiliary: The liver is unremarkable. No intrahepatic biliary dilatation. Cholecystectomy. No retained calcified stone noted in the central CBD. Pancreas: Unremarkable. No pancreatic ductal dilatation or surrounding inflammatory changes. Spleen: Small calcified splenic  granuloma. The spleen is otherwise unremarkable. Adrenals/Urinary Tract: The adrenal glands are unremarkable. Several scattered vascular calcification versus small nonobstructing renal calculi measure up to 3 mm. There is no hydronephrosis on either side. The visualized ureters and urinary bladder appear unremarkable. Stomach/Bowel: There is severe sigmoid diverticulosis without active inflammatory changes. There is a 4 cm duodenal diverticulum without active inflammation. There is inflammatory changes involving the cecum and proximal ascending colon most consistent with colitis, likely infectious in etiology. Underlying mass is less likely. Clinical correlation and follow-up recommended. There is no bowel obstruction. The appendix is normal. Vascular/Lymphatic: There is advanced aortoiliac atherosclerotic disease. A fusiform infrarenal abdominal aortic aneurysm measures up to 3.9 cm in diameter similar to prior CT. Evaluation of the aorta and vasculature is limited in the absence of intravenous contrast. The IVC is unremarkable. No portal venous gas. There is no adenopathy. Reproductive: Hysterectomy. A pessary is noted in the pelvis. Other: None Musculoskeletal: Osteopenia with degenerative changes of the spine. No acute osseous pathology. IMPRESSION: 1. Colitis involving the cecum and proximal ascending colon, likely infectious in etiology. Clinical correlation and follow-up recommended. No bowel obstruction. Normal appendix. 2. Severe sigmoid diverticulosis. 3. Fusiform infrarenal abdominal aortic aneurysm measuring up to 3.9 cm in diameter similar to prior CT. Recommend follow-up ultrasound every 2 years. This recommendation follows ACR consensus guidelines: White Paper of the ACR Incidental Findings Committee II on Vascular Findings. J Am Coll Radiol 2013; 16:109-604. 4. Aortic Atherosclerosis (ICD10-I70.0). Electronically Signed   By: Anner Crete M.D.   On: 11/10/2020 20:37     Procedures Procedures  Medications Ordered in the ED Medications  cefTRIAXone (ROCEPHIN) 2 g in sodium chloride 0.9 % 100 mL IVPB (0 g Intravenous Stopped 11/10/20 2304)    And  metroNIDAZOLE (FLAGYL) IVPB 500 mg (500 mg Intravenous New Bag/Given 11/10/20 2305)  sodium chloride 0.9 % bolus 500 mL (0 mLs Intravenous Stopped 11/10/20 2041)  ondansetron (ZOFRAN) injection 4 mg (4 mg Intravenous Given  11/10/20 1755)  fentaNYL (SUBLIMAZE) injection 50 mcg (50 mcg Intravenous Given 11/10/20 1755)  morphine 4 MG/ML injection 4 mg (4 mg Intravenous Given 11/10/20 2207)     MDM Rules/Calculators/A&P MDM Patient with RLQ pain, guarding. Unsure if she still has appendix. Also had one episode of possible hematochezia. Will check labs and send for CT. Pain and nausea meds for comfort.  ED Course  I have reviewed the triage vital signs and the nursing notes.  Pertinent labs & imaging results that were available during my care of the patient were reviewed by me and considered in my medical decision making (see chart for details).  Clinical Course as of 11/10/20 2318  Mon Nov 10, 2020  1825 CBC with elevated WBC, left shift.  [CS]  1903 CMP and lipase unremarkable.  [CS]  2104 CT shows colitis of cecum and prox colon. Will begin Abx. Plan admission for pain control and continued IV Abx. Hospitalist paged.  [CS]  2311 Spoke with Dr. Josephine Cables, Hospitalist, who will evaluate for admission. [CS]    Clinical Course User Index [CS] Truddie Hidden, MD    Final Clinical Impression(s) / ED Diagnoses Final diagnoses:  Colitis    Rx / DC Orders ED Discharge Orders    None       Truddie Hidden, MD 11/10/20 2318

## 2020-11-11 ENCOUNTER — Encounter (HOSPITAL_COMMUNITY): Payer: Self-pay | Admitting: Internal Medicine

## 2020-11-11 DIAGNOSIS — E1165 Type 2 diabetes mellitus with hyperglycemia: Secondary | ICD-10-CM

## 2020-11-11 DIAGNOSIS — R109 Unspecified abdominal pain: Secondary | ICD-10-CM

## 2020-11-11 DIAGNOSIS — K529 Noninfective gastroenteritis and colitis, unspecified: Secondary | ICD-10-CM | POA: Diagnosis not present

## 2020-11-11 DIAGNOSIS — D72829 Elevated white blood cell count, unspecified: Secondary | ICD-10-CM

## 2020-11-11 LAB — COMPREHENSIVE METABOLIC PANEL
ALT: 11 U/L (ref 0–44)
AST: 15 U/L (ref 15–41)
Albumin: 3.4 g/dL — ABNORMAL LOW (ref 3.5–5.0)
Alkaline Phosphatase: 52 U/L (ref 38–126)
Anion gap: 8 (ref 5–15)
BUN: 23 mg/dL (ref 8–23)
CO2: 23 mmol/L (ref 22–32)
Calcium: 8.5 mg/dL — ABNORMAL LOW (ref 8.9–10.3)
Chloride: 104 mmol/L (ref 98–111)
Creatinine, Ser: 1.02 mg/dL — ABNORMAL HIGH (ref 0.44–1.00)
GFR, Estimated: 54 mL/min — ABNORMAL LOW (ref >60)
Glucose, Bld: 157 mg/dL — ABNORMAL HIGH (ref 70–99)
Potassium: 3.8 mmol/L (ref 3.5–5.1)
Sodium: 135 mmol/L (ref 135–145)
Total Bilirubin: 0.5 mg/dL (ref 0.3–1.2)
Total Protein: 5.8 g/dL — ABNORMAL LOW (ref 6.5–8.1)

## 2020-11-11 LAB — CBC
HCT: 37.3 % (ref 36.0–46.0)
Hemoglobin: 11.9 g/dL — ABNORMAL LOW (ref 12.0–15.0)
MCH: 30.2 pg (ref 26.0–34.0)
MCHC: 31.9 g/dL (ref 30.0–36.0)
MCV: 94.7 fL (ref 80.0–100.0)
Platelets: 157 10*3/uL (ref 150–400)
RBC: 3.94 MIL/uL (ref 3.87–5.11)
RDW: 11.6 % (ref 11.5–15.5)
WBC: 13.6 10*3/uL — ABNORMAL HIGH (ref 4.0–10.5)
nRBC: 0 % (ref 0.0–0.2)

## 2020-11-11 LAB — PROCALCITONIN: Procalcitonin: 0.38 ng/mL

## 2020-11-11 LAB — HEMOGLOBIN A1C
Hgb A1c MFr Bld: 7.6 % — ABNORMAL HIGH (ref 4.8–5.6)
Mean Plasma Glucose: 171 mg/dL

## 2020-11-11 LAB — GLUCOSE, CAPILLARY
Glucose-Capillary: 132 mg/dL — ABNORMAL HIGH (ref 70–99)
Glucose-Capillary: 116 mg/dL — ABNORMAL HIGH (ref 70–99)
Glucose-Capillary: 139 mg/dL — ABNORMAL HIGH (ref 70–99)
Glucose-Capillary: 116 mg/dL — ABNORMAL HIGH (ref 70–99)

## 2020-11-11 LAB — PROTIME-INR
INR: 1.1 (ref 0.8–1.2)
Prothrombin Time: 14.2 seconds (ref 11.4–15.2)

## 2020-11-11 LAB — PHOSPHORUS: Phosphorus: 2.8 mg/dL (ref 2.5–4.6)

## 2020-11-11 LAB — APTT: aPTT: 27 seconds (ref 24–36)

## 2020-11-11 LAB — MAGNESIUM: Magnesium: 1.2 mg/dL — ABNORMAL LOW (ref 1.7–2.4)

## 2020-11-11 MED ORDER — SODIUM CHLORIDE 0.9 % IV SOLN
2.0000 g | INTRAVENOUS | Status: DC
  Administered 2020-11-12 – 2020-11-16 (×5): 2 g via INTRAVENOUS
  Filled 2020-11-11 (×5): qty 20

## 2020-11-11 MED ORDER — SODIUM CHLORIDE 0.9 % IV SOLN: Freq: Once | INTRAVENOUS | Status: AC

## 2020-11-11 MED ORDER — INSULIN GLARGINE 100 UNIT/ML ~~LOC~~ SOLN
10.0000 [IU] | Freq: Every day | SUBCUTANEOUS | Status: DC
  Administered 2020-11-11 – 2020-11-14 (×4): 10 [IU] via SUBCUTANEOUS
  Filled 2020-11-11 (×6): qty 0.1

## 2020-11-11 MED ORDER — PROCHLORPERAZINE 25 MG RE SUPP
25.0000 mg | Freq: Two times a day (BID) | RECTAL | Status: DC | PRN
  Administered 2020-11-11: 25 mg via RECTAL
  Filled 2020-11-11 (×2): qty 1

## 2020-11-11 MED ORDER — ONDANSETRON HCL 4 MG/2ML IJ SOLN
4.0000 mg | Freq: Four times a day (QID) | INTRAMUSCULAR | Status: DC | PRN
  Administered 2020-11-11 – 2020-11-12 (×2): 4 mg via INTRAVENOUS
  Filled 2020-11-11 (×2): qty 2

## 2020-11-11 MED ORDER — INSULIN ASPART 100 UNIT/ML IJ SOLN
0.0000 [IU] | Freq: Three times a day (TID) | INTRAMUSCULAR | Status: DC
  Administered 2020-11-11 – 2020-11-14 (×3): 2 [IU] via SUBCUTANEOUS

## 2020-11-11 MED ORDER — CARVEDILOL 3.125 MG PO TABS
6.2500 mg | ORAL_TABLET | Freq: Two times a day (BID) | ORAL | Status: DC
  Administered 2020-11-11 – 2020-11-16 (×10): 6.25 mg via ORAL
  Filled 2020-11-11 (×9): qty 2

## 2020-11-11 MED ORDER — CARVEDILOL 12.5 MG PO TABS
25.0000 mg | ORAL_TABLET | Freq: Two times a day (BID) | ORAL | Status: DC
  Administered 2020-11-11: 25 mg via ORAL
  Filled 2020-11-11: qty 2

## 2020-11-11 MED ORDER — ENOXAPARIN SODIUM 40 MG/0.4ML IJ SOSY
40.0000 mg | PREFILLED_SYRINGE | INTRAMUSCULAR | Status: DC
  Administered 2020-11-11 – 2020-11-16 (×6): 40 mg via SUBCUTANEOUS
  Filled 2020-11-11 (×7): qty 0.4

## 2020-11-11 MED ORDER — SODIUM CHLORIDE 0.9 % IV SOLN: INTRAVENOUS | Status: AC

## 2020-11-11 MED ORDER — PRAVASTATIN SODIUM 40 MG PO TABS
80.0000 mg | ORAL_TABLET | Freq: Every morning | ORAL | Status: DC
  Administered 2020-11-11 – 2020-11-16 (×6): 80 mg via ORAL
  Filled 2020-11-11 (×6): qty 2

## 2020-11-11 MED ORDER — PANTOPRAZOLE SODIUM 40 MG PO TBEC
40.0000 mg | DELAYED_RELEASE_TABLET | Freq: Every day | ORAL | Status: DC
  Administered 2020-11-11 – 2020-11-14 (×4): 40 mg via ORAL
  Filled 2020-11-11 (×3): qty 1

## 2020-11-11 MED ORDER — MORPHINE SULFATE (PF) 2 MG/ML IV SOLN
2.0000 mg | INTRAVENOUS | Status: DC | PRN
  Administered 2020-11-11: 2 mg via INTRAVENOUS
  Filled 2020-11-11: qty 1

## 2020-11-11 MED ORDER — KETOROLAC TROMETHAMINE 15 MG/ML IJ SOLN
15.0000 mg | Freq: Once | INTRAMUSCULAR | Status: AC
  Administered 2020-11-11: 15 mg via INTRAVENOUS
  Filled 2020-11-11: qty 1

## 2020-11-11 MED ORDER — AMLODIPINE BESYLATE 5 MG PO TABS
5.0000 mg | ORAL_TABLET | Freq: Every day | ORAL | Status: DC
  Filled 2020-11-11: qty 1

## 2020-11-11 MED ORDER — METRONIDAZOLE 500 MG/100ML IV SOLN
500.0000 mg | Freq: Three times a day (TID) | INTRAVENOUS | Status: DC
  Administered 2020-11-11 – 2020-11-13 (×7): 500 mg via INTRAVENOUS
  Filled 2020-11-11 (×7): qty 100

## 2020-11-11 MED ORDER — FENTANYL CITRATE (PF) 100 MCG/2ML IJ SOLN: 25.0000 ug | INTRAMUSCULAR | Status: DC | PRN

## 2020-11-11 MED ORDER — SODIUM CHLORIDE 0.9 % IV SOLN: INTRAVENOUS | Status: DC

## 2020-11-11 MED ORDER — INSULIN ASPART 100 UNIT/ML IJ SOLN: 0.0000 [IU] | Freq: Every day | INTRAMUSCULAR | Status: DC

## 2020-11-11 MED ORDER — LEVOFLOXACIN IN D5W 750 MG/150ML IV SOLN
750.0000 mg | INTRAVENOUS | Status: DC
  Administered 2020-11-11: 750 mg via INTRAVENOUS
  Filled 2020-11-11: qty 150

## 2020-11-11 MED ORDER — OXYCODONE HCL 5 MG PO TABS
5.0000 mg | ORAL_TABLET | ORAL | Status: DC | PRN
  Administered 2020-11-11 – 2020-11-16 (×17): 5 mg via ORAL
  Filled 2020-11-11 (×17): qty 1

## 2020-11-11 NOTE — Progress Notes (Signed)
BP checked manually. 110/52. MD Josephine Cables made aware. Will continue to monitor.

## 2020-11-11 NOTE — Progress Notes (Signed)
BP on L arm 80/67. On R arm 90/79. MD Josephine Cables made aware. Will continue to monitor.

## 2020-11-11 NOTE — Progress Notes (Signed)
Pharmacy Antibiotic Note  Ariana Shea is a 87 y.o. female admitted on 11/10/2020 with abdominal pain/colitis.  Pharmacy has been consulted for Levaquin dosing.  Plan: Levaquin 750 mg IV q48h  Height: 5\' 3"  (160 cm) Weight: 70 kg (154 lb 5.2 oz) IBW/kg (Calculated) : 52.4  Temp (24hrs), Avg:98.3 F (36.8 C), Min:98.2 F (36.8 C), Max:98.4 F (36.9 C)  Recent Labs  Lab 11/10/20 1800  WBC 15.9*  CREATININE 1.13*    Estimated Creatinine Clearance: 34.1 mL/min (A) (by C-G formula based on SCr of 1.13 mg/dL (H)).    Allergies  Allergen Reactions  . Lisinopril     Other reaction(s): Other (See Comments)  Decreased kidney functions  . Red Blood Cells Other (See Comments)    Patient is a Jehovah's Witness  . Penicillins Rash  . Sulfonamide Derivatives Rash    Caryl Pina 11/11/2020 5:29 AM

## 2020-11-11 NOTE — Progress Notes (Signed)
Patient Demographics:    Ariana Shea, is a 87 y.o. female, DOB - 02/23/1934, IAX:[id number]  Admit date - 11/10/2020   Admitting Physician Bernadette Hoit, DO  Outpatient Primary MD for the patient is [place] [place] [facility]  LOS - 1   Chief Complaint  Patient presents with  . Abdominal Pain        Subjective:    Ariana Shea today has no fevers,   No chest pain,   -Nausea and abdominal pain persist -Voiding, no BM  Assessment  & Plan :    Principal Problem:   Acute colitis Active Problems:   Hypertension   GERD (gastroesophageal reflux disease)   Nausea & vomiting   Abdominal aortic aneurysm (HCC)   Hyperlipidemia   Dehydration   Abdominal pain   Leukocytosis   Hyperglycemia due to diabetes mellitus Ariana Shea)  Brief Summary:-  87 y.o. female with medical history significant for HLD, HTN and GERD and DM2--admitted on 11/10/2020 with acute colitis presumed infectious   A/p  1) acute colitis--- presumed infectious, patient with penicillin allergy, treat empirically with Rocephin and Flagyl -As needed opiates and antiemetics -Clear liquid diet for now  2)DM2-Reduce Lantus to  10 units nightly until oral intake is more reliable - Use Novolog/Humalog Sliding scale insulin with Accu-Cheks/Fingersticks as ordered -Adjust insulin as oral intake improves  3)GERD-Protonix as ordered  4)HTN-BP has been soft, hold amlodipine, reduce Coreg to 6.25 mg twice daily  5)HFrEF/patient with history of combined systolic and diastolic dysfunction CHF--EF from May 2021 echo EF 40 to 45%, that particular echo also showed grade 1 diastolic dysfunction -Be judicious with IV fluids to avoid overload    6)HLD--- continue pravastatin  Disposition/Need for in-[facility] Stay- patient unable to be discharged at this time due to --- acute colitis requiring IV antiemetics, IV pain medications and IV  antibiotics-*  Status is: Inpatient  Remains inpatient appropriate because:Please see disposition above   Disposition: The patient is from: Home              Anticipated d/c is to: Home              Anticipated d/c date is: 2 days              Patient currently is not medically stable to d/c. Barriers: Not Clinically Stable-   Code Status :  -  Code Status: Full Code   Family Communication:    NA (patient is alert, awake and coherent)   Consults  :  na  DVT Prophylaxis  :   - SCDs   enoxaparin (LOVENOX) injection 40 mg Start: 11/11/20 1000 SCDs Start: 11/11/20 0130    Lab Results  Component Value Date   PLT 157 11/11/2020    Inpatient Medications  Scheduled Meds: . carvedilol  6.25 mg Oral BID WC  . enoxaparin (LOVENOX) injection  40 mg Subcutaneous Q24H  . insulin aspart  0-15 Units Subcutaneous TID WC  . insulin aspart  0-5 Units Subcutaneous QHS  . insulin glargine  10 Units Subcutaneous QHS  . pantoprazole  40 mg Oral Daily  . pravastatin  80 mg Oral q morning   Continuous Infusions: . sodium chloride 50 mL/hr at 11/11/20 1531  . [START  ON 11/12/2020] cefTRIAXone (ROCEPHIN)  IV    . metronidazole 500 mg (11/11/20 1220)   PRN Meds:.fentaNYL (SUBLIMAZE) injection, ondansetron (ZOFRAN) IV, oxyCODONE, prochlorperazine    Anti-infectives (From admission, onward)   Start     Dose/Rate Route Frequency Ordered Stop   11/12/20 0800  cefTRIAXone (ROCEPHIN) 2 g in sodium chloride 0.9 % 100 mL IVPB        2 g 200 mL/hr over 30 Minutes Intravenous Every 24 hours 11/11/20 1036     11/11/20 1200  metroNIDAZOLE (FLAGYL) IVPB 500 mg        500 mg 100 mL/hr over 60 Minutes Intravenous Every 8 hours 11/11/20 1033     11/11/20 1000  levofloxacin (LEVAQUIN) IVPB 750 mg  Status:  Discontinued        750 mg 100 mL/hr over 90 Minutes Intravenous Every 48 hours 11/11/20 0531 11/11/20 1009   11/10/20 2115  cefTRIAXone (ROCEPHIN) 2 g in sodium chloride 0.9 % 100 mL IVPB        "And" Linked Group Details   2 g 200 mL/hr over 30 Minutes Intravenous  Once 11/10/20 2103 11/10/20 2304   11/10/20 2115  metroNIDAZOLE (FLAGYL) IVPB 500 mg       "And" Linked Group Details   500 mg 100 mL/hr over 60 Minutes Intravenous  Once 11/10/20 2103 11/11/20 0031        Objective:   Vitals:   11/11/20 0800 11/11/20 0939 11/11/20 1410 11/11/20 1435  BP: (!) 126/52 (!) 109/53 (!) 106/46 (!) 96/41  Pulse: 76 82 88 85  Resp:  17 18 16   Temp:   98.4 F (36.9 C) 99.6 F (37.6 C)  TempSrc:    Oral  SpO2:  95% 92% 90%  Weight:      Height:        Wt Readings from Last 3 Encounters:  11/10/20 70 kg  02/13/20 59.8 kg  10/30/19 62.9 kg     Intake/Output Summary (Last 24 hours) at 11/11/2020 1558 Last data filed at 11/11/2020 1310 Gross per 24 hour  Intake 389.37 ml  Output --  Net 389.37 ml   Physical Exam  Gen:- Awake Alert,  In no apparent distress  HEENT:- [place].AT, No sclera icterus Neck-Supple Neck,No JVD,.  Lungs-  CTAB , fair symmetrical air movement CV- S1, S2 normal, regular  Abd-  +ve B.Sounds, Abd Soft, lower abdominal tenderness, no rebound or guarding Extremity/Skin:- No  edema, pedal pulses present  Psych-affect is appropriate, oriented x3 Neuro-generalized weakness, no new focal deficits, no tremors   Data Review:   Micro Results Recent Results (from the past 240 hour(s))  Resp Panel by RT-PCR (Flu A&B, Covid) Nasopharyngeal Swab     Status: None   Collection Time: 11/10/20 10:11 PM   Specimen: Nasopharyngeal Swab; Nasopharyngeal(NP) swabs in vial transport medium  Result Value Ref Range Status   SARS Coronavirus 2 by RT PCR NEGATIVE NEGATIVE Final    Comment: (NOTE) SARS-CoV-2 target nucleic acids are NOT DETECTED.  The SARS-CoV-2 RNA is generally detectable in upper respiratory specimens during the acute phase of infection. The lowest concentration of SARS-CoV-2 viral copies this assay can detect is 138 copies/mL. A negative result does not  preclude SARS-Cov-2 infection and should not be used as the sole basis for treatment or other patient management decisions. A negative result may occur with  improper specimen collection/handling, submission of specimen other than nasopharyngeal swab, presence of viral mutation(s) within the areas targeted by this assay, and inadequate  number of viral copies(<138 copies/mL). A negative result must be combined with clinical observations, patient history, and epidemiological information. The expected result is Negative.  Fact Sheet for Patients:  EntrepreneurPulse.com.au  Fact Sheet for Healthcare Providers:  IncredibleEmployment.be  This test is no t yet approved or cleared by the [place] FDA and  has been authorized for detection and/or diagnosis of SARS-CoV-2 by FDA under an Emergency Use Authorization (EUA). This EUA will remain  in effect (meaning this test can be used) for the duration of the COVID-19 declaration under Section 564(b)(1) of the Act, 21 U.S.C.section 360bbb-3(b)(1), unless the authorization is terminated  or revoked sooner.       Influenza A by PCR NEGATIVE NEGATIVE Final   Influenza B by PCR NEGATIVE NEGATIVE Final    Comment: (NOTE) The Xpert Xpress SARS-CoV-2/FLU/RSV plus assay is intended as an aid in the diagnosis of influenza from Nasopharyngeal swab specimens and should not be used as a sole basis for treatment. Nasal washings and aspirates are unacceptable for Xpert Xpress SARS-CoV-2/FLU/RSV testing.  Fact Sheet for Patients: EntrepreneurPulse.com.au  Fact Sheet for Healthcare Providers: IncredibleEmployment.be  This test is not yet approved or cleared by the [place] FDA and has been authorized for detection and/or diagnosis of SARS-CoV-2 by FDA under an Emergency Use Authorization (EUA). This EUA will remain in effect (meaning this test can be used) for the  duration of the COVID-19 declaration under Section 564(b)(1) of the Act, 21 U.S.C. section 360bbb-3(b)(1), unless the authorization is terminated or revoked.  Performed at [facility], [address], [place], [place] [address]   Culture, blood (Routine X 2) w Reflex to ID Panel     Status: None (Preliminary result)   Collection Time: 11/11/20  6:04 AM   Specimen: BLOOD  Result Value Ref Range Status   Specimen Description BLOOD RIGHT ANTECUBITAL  Final   Special Requests   Final    BOTTLES DRAWN AEROBIC AND ANAEROBIC Blood Culture adequate volume Performed at [facility], [address] [place], [place] [address]    Culture PENDING  Incomplete   Report Status PENDING  Incomplete  Culture, blood (Routine X 2) w Reflex to ID Panel     Status: None (Preliminary result)   Collection Time: 11/11/20  6:06 AM   Specimen: BLOOD  Result Value Ref Range Status   Specimen Description BLOOD RIGHT HAND  Final   Special Requests   Final    BOTTLES DRAWN AEROBIC ONLY Blood Culture adequate volume Performed at [facility], [address] [place], [place] [address]    Culture PENDING  Incomplete   Report Status PENDING  Incomplete    Radiology Reports CT Abdomen Pelvis Wo Contrast  Result Date: 11/10/2020 CLINICAL DATA:  87 year old female with right lower quadrant abdominal pain. EXAM: CT ABDOMEN AND PELVIS WITHOUT CONTRAST TECHNIQUE: Multidetector CT imaging of the abdomen and pelvis was performed following the standard protocol without IV contrast. COMPARISON:  CT abdomen pelvis dated 10/24/2019. FINDINGS: Evaluation of this exam is limited in the absence of intravenous contrast. Lower chest: Minimal bibasilar linear atelectasis/scarring. The visualized lung bases are otherwise clear. There is coronary vascular calcification and calcification of the mitral annulus. No intra-abdominal free air or free fluid. Hepatobiliary: The liver is unremarkable. No intrahepatic biliary dilatation.  Cholecystectomy. No retained calcified stone noted in the central CBD. Pancreas: Unremarkable. No pancreatic ductal dilatation or surrounding inflammatory changes. Spleen: Small calcified splenic granuloma. The spleen is otherwise unremarkable. Adrenals/Urinary Tract: The adrenal glands  are unremarkable. Several scattered vascular calcification versus small nonobstructing renal calculi measure up to 3 mm. There is no hydronephrosis on either side. The visualized ureters and urinary bladder appear unremarkable. Stomach/Bowel: There is severe sigmoid diverticulosis without active inflammatory changes. There is a 4 cm duodenal diverticulum without active inflammation. There is inflammatory changes involving the cecum and proximal ascending colon most consistent with colitis, likely infectious in etiology. Underlying mass is less likely. Clinical correlation and follow-up recommended. There is no bowel obstruction. The appendix is normal. Vascular/Lymphatic: There is advanced aortoiliac atherosclerotic disease. A fusiform infrarenal abdominal aortic aneurysm measures up to 3.9 cm in diameter similar to prior CT. Evaluation of the aorta and vasculature is limited in the absence of intravenous contrast. The IVC is unremarkable. No portal venous gas. There is no adenopathy. Reproductive: Hysterectomy. A pessary is noted in the pelvis. Other: None Musculoskeletal: Osteopenia with degenerative changes of the spine. No acute osseous pathology. IMPRESSION: 1. Colitis involving the cecum and proximal ascending colon, likely infectious in etiology. Clinical correlation and follow-up recommended. No bowel obstruction. Normal appendix. 2. Severe sigmoid diverticulosis. 3. Fusiform infrarenal abdominal aortic aneurysm measuring up to 3.9 cm in diameter similar to prior CT. Recommend follow-up ultrasound every 2 years. This recommendation follows ACR consensus guidelines: White Paper of the ACR Incidental Findings Committee II on  Vascular Findings. J Am Coll Radiol 2013; 06:237-628. 4. Aortic Atherosclerosis (ICD10-I70.0). Electronically Signed   By: Anner Crete M.D.   On: 11/10/2020 20:37     CBC Recent Labs  Lab 11/10/20 1800 11/11/20 0604  WBC 15.9* 13.6*  HGB 13.1 11.9*  HCT 40.1 37.3  PLT 185 157  MCV 94.4 94.7  MCH 30.8 30.2  MCHC 32.7 31.9  RDW 11.9 11.6  LYMPHSABS 1.0  --   MONOABS 1.3*  --   EOSABS 0.0  --   BASOSABS 0.0  --     Chemistries  Recent Labs  Lab 11/10/20 1800 11/11/20 0604  NA 136 135  K 4.1 3.8  CL 104 104  CO2 25 23  GLUCOSE 183* 157*  BUN 28* 23  CREATININE 1.13* 1.02*  CALCIUM 9.7 8.5*  MG  --  1.2*  AST 15 15  ALT 12 11  ALKPHOS 65 52  BILITOT 0.7 0.5   ------------------------------------------------------------------------------------------------------------------ No results for input(s): CHOL, HDL, LDLCALC, TRIG, CHOLHDL, LDLDIRECT in the last 72 hours.  Lab Results  Component Value Date   HGBA1C 6.2 (H) 10/24/2019   ------------------------------------------------------------------------------------------------------------------ No results for input(s): TSH, T4TOTAL, T3FREE, THYROIDAB in the last 72 hours.  Invalid input(s): FREET3 ------------------------------------------------------------------------------------------------------------------ No results for input(s): VITAMINB12, FOLATE, FERRITIN, TIBC, IRON, RETICCTPCT in the last 72 hours.  Coagulation profile Recent Labs  Lab 11/11/20 0604  INR 1.1    No results for input(s): DDIMER in the last 72 hours.  Cardiac Enzymes No results for input(s): CKMB, TROPONINI, MYOGLOBIN in the last 168 hours.  Invalid input(s): CK ------------------------------------------------------------------------------------------------------------------ No results found for: BNP   Roxan Hockey M.D on 11/11/2020 at 3:58 PM  Go to [email or web address] - for contact info  [facility] - Office   [phone]

## 2020-11-12 DIAGNOSIS — K529 Noninfective gastroenteritis and colitis, unspecified: Secondary | ICD-10-CM | POA: Diagnosis not present

## 2020-11-12 LAB — CBC
HCT: 36.8 % (ref 36.0–46.0)
Hemoglobin: 11.7 g/dL — ABNORMAL LOW (ref 12.0–15.0)
MCH: 30.3 pg (ref 26.0–34.0)
MCHC: 31.8 g/dL (ref 30.0–36.0)
MCV: 95.3 fL (ref 80.0–100.0)
Platelets: 152 10*3/uL (ref 150–400)
RBC: 3.86 MIL/uL — ABNORMAL LOW (ref 3.87–5.11)
RDW: 11.8 % (ref 11.5–15.5)
WBC: 15.4 10*3/uL — ABNORMAL HIGH (ref 4.0–10.5)
nRBC: 0 % (ref 0.0–0.2)

## 2020-11-12 LAB — BASIC METABOLIC PANEL
Anion gap: 9 (ref 5–15)
BUN: 22 mg/dL (ref 8–23)
CO2: 22 mmol/L (ref 22–32)
Calcium: 8.1 mg/dL — ABNORMAL LOW (ref 8.9–10.3)
Chloride: 104 mmol/L (ref 98–111)
Creatinine, Ser: 1.04 mg/dL — ABNORMAL HIGH (ref 0.44–1.00)
GFR, Estimated: 53 mL/min — ABNORMAL LOW (ref >60)
Glucose, Bld: 100 mg/dL — ABNORMAL HIGH (ref 70–99)
Potassium: 3.8 mmol/L (ref 3.5–5.1)
Sodium: 135 mmol/L (ref 135–145)

## 2020-11-12 LAB — GLUCOSE, CAPILLARY
Glucose-Capillary: 103 mg/dL — ABNORMAL HIGH (ref 70–99)
Glucose-Capillary: 107 mg/dL — ABNORMAL HIGH (ref 70–99)
Glucose-Capillary: 108 mg/dL — ABNORMAL HIGH (ref 70–99)
Glucose-Capillary: 104 mg/dL — ABNORMAL HIGH (ref 70–99)

## 2020-11-12 MED ORDER — SODIUM CHLORIDE 0.9 % IV SOLN: INTRAVENOUS | Status: DC

## 2020-11-12 MED ORDER — SODIUM CHLORIDE 0.9 % IV SOLN: INTRAVENOUS | Status: AC

## 2020-11-12 NOTE — Progress Notes (Signed)
Patient Demographics:    Ariana Shea, is a 87 y.o. female, DOB - 25-Nov-1933, EHU:[id number]  Admit date - 11/10/2020   Admitting Physician Bernadette Hoit, DO  Outpatient Primary MD for the patient is [place] [place] [facility]  LOS - 2   Chief Complaint  Patient presents with  . Abdominal Pain        Subjective:    Ariana Shea was seen and examined this morning, awake alert, complaining of nausea, diffuse abdominal tenderness Medically stable Nursing staff present at bedside  Patient son present at bedside, discussed current plan of care    Assessment  & Plan :    Principal Problem:   Acute colitis Active Problems:   Hypertension   GERD (gastroesophageal reflux disease)   Nausea & vomiting   Abdominal aortic aneurysm (HCC)   Hyperlipidemia   Dehydration   Abdominal pain   Leukocytosis   Hyperglycemia due to diabetes mellitus [facility])  Brief Summary:-  87 y.o. female with medical history significant for HLD, HTN and GERD and DM2--admitted on 11/10/2020 with acute colitis presumed infectious   Assessment and plan  1) acute colitis -Likely bacterial infection, allergy, patient has been initiated on IV antibiotics of Rocephin and Flagyl which will be continued -We will continue to encourage clear liquid diet... Crease p.o. intake if tolerated -As needed analgesics and antiemetics  2)DM2 -Monitoring CBG every 4 hours, with follow-up/Humalog SSI coverage -Reduce Lantus to  10 units nightly until oral intake is more reliable -Adjust insulin as oral intake improves  3)GERD -continue Protonix  4)HTN-BP has been soft, hold amlodipine, reduce Coreg to 6.25 mg twice daily  5)HFrEF/patient with history of combined systolic and diastolic dysfunction CHF--EF from May 2021 echo EF 40 to 45%, that particular echo also showed grade 1 diastolic dysfunction -Restarting IV fluids due  to poor p.o. intake, will monitor closely due to CHF  6)HLD--- continue pravastatin  Disposition/Need for in-Hospital Stay - patient unable to be discharged at this time due to  Patient continued to have nausea vomiting unable to tolerate p.o. -- acute colitis requiring IV antiemetics, IV pain medications and IV antibiotics-*  Status is: Inpatient  Remains inpatient appropriate because:Please see disposition above   Disposition: The patient is from: Home              Anticipated d/c is to: Home              Anticipated d/c date is: 2 days              Patient currently is not medically stable to d/c. Barriers: Not Clinically Stable-   Code Status :  -  Code Status: Full Code   Family Communication:    NA (patient is alert, awake and coherent), also discussed with the patient's son at bedside  Consults  :  na  DVT Prophylaxis  :   - SCDs   enoxaparin (LOVENOX) injection 40 mg Start: 11/11/20 1000 SCDs Start: 11/11/20 0130    Lab Results  Component Value Date   PLT 152 11/12/2020    Inpatient Medications  Scheduled Meds: . carvedilol  6.25 mg Oral BID WC  . enoxaparin (LOVENOX) injection  40 mg Subcutaneous Q24H  . insulin aspart  0-15 Units Subcutaneous TID WC  . insulin aspart  0-5 Units Subcutaneous QHS  . insulin glargine  10 Units Subcutaneous QHS  . pantoprazole  40 mg Oral Daily  . pravastatin  80 mg Oral q morning   Continuous Infusions: . sodium chloride    . cefTRIAXone (ROCEPHIN)  IV 2 g (11/12/20 0846)  . metronidazole 500 mg (11/12/20 1159)   PRN Meds:.fentaNYL (SUBLIMAZE) injection, ondansetron (ZOFRAN) IV, oxyCODONE, prochlorperazine    Anti-infectives (From admission, onward)   Start     Dose/Rate Route Frequency Ordered Stop   11/12/20 0800  cefTRIAXone (ROCEPHIN) 2 g in sodium chloride 0.9 % 100 mL IVPB        2 g 200 mL/hr over 30 Minutes Intravenous Every 24 hours 11/11/20 1036     11/11/20 1200  metroNIDAZOLE (FLAGYL) IVPB 500 mg         500 mg 100 mL/hr over 60 Minutes Intravenous Every 8 hours 11/11/20 1033     11/11/20 1000  levofloxacin (LEVAQUIN) IVPB 750 mg  Status:  Discontinued        750 mg 100 mL/hr over 90 Minutes Intravenous Every 48 hours 11/11/20 0531 11/11/20 1009   11/10/20 2115  cefTRIAXone (ROCEPHIN) 2 g in sodium chloride 0.9 % 100 mL IVPB       "And" Linked Group Details   2 g 200 mL/hr over 30 Minutes Intravenous  Once 11/10/20 2103 11/10/20 2304   11/10/20 2115  metroNIDAZOLE (FLAGYL) IVPB 500 mg       "And" Linked Group Details   500 mg 100 mL/hr over 60 Minutes Intravenous  Once 11/10/20 2103 11/11/20 0031        Objective:   Vitals:   11/11/20 1410 11/11/20 1435 11/11/20 2113 11/12/20 0623  BP: (!) 106/46 (!) 96/41 (!) 111/48 (!) 116/51  Pulse: 88 85 82 84  Resp: 18 16 16 16   Temp: 98.4 F (36.9 C) 99.6 F (37.6 C) 98.5 F (36.9 C) 98.1 F (36.7 C)  TempSrc:  Oral Oral Oral  SpO2: 92% 90% 94% 94%  Weight:      Height:        Wt Readings from Last 3 Encounters:  11/10/20 70 kg  02/13/20 59.8 kg  10/30/19 62.9 kg     Intake/Output Summary (Last 24 hours) at 11/12/2020 1328 Last data filed at 11/12/2020 0846 Gross per 24 hour  Intake 821.57 ml  Output --  Net 821.57 ml   Physical Exam     Physical Exam:   General:  Alert, oriented, cooperative, no distress;   HEENT:  Normocephalic, PERRL, otherwise with in Normal limits   Neuro:  CNII-XII intact. , normal motor and sensation, reflexes intact   Lungs:   Clear to auscultation BL, Respirations unlabored, no wheezes / crackles  Cardio:    S1/S2, RRR, No murmure, No Rubs or Gallops   Abdomen:   Soft, diffuse abdominal tenderness, bowel sounds active all four quadrants,  no guarding or peritoneal signs.  Muscular skeletal:   Generalized weaknesses, Limited exam - in bed, able to move all 4 extremities, Normal strength,  2+ pulses,  symmetric, No pitting edema  Skin:  Dry, warm to touch, negative for any Rashes,  Wounds:  Please see nursing documentation           Data Review:   Micro Results Recent Results (from the past 240 hour(s))  Resp Panel by RT-PCR (Flu A&B, Covid) Nasopharyngeal Swab     Status: None  Collection Time: 11/10/20 10:11 PM   Specimen: Nasopharyngeal Swab; Nasopharyngeal(NP) swabs in vial transport medium  Result Value Ref Range Status   SARS Coronavirus 2 by RT PCR NEGATIVE NEGATIVE Final    Comment: (NOTE) SARS-CoV-2 target nucleic acids are NOT DETECTED.  The SARS-CoV-2 RNA is generally detectable in upper respiratory specimens during the acute phase of infection. The lowest concentration of SARS-CoV-2 viral copies this assay can detect is 138 copies/mL. A negative result does not preclude SARS-Cov-2 infection and should not be used as the sole basis for treatment or other patient management decisions. A negative result may occur with  improper specimen collection/handling, submission of specimen other than nasopharyngeal swab, presence of viral mutation(s) within the areas targeted by this assay, and inadequate number of viral copies(<138 copies/mL). A negative result must be combined with clinical observations, patient history, and epidemiological information. The expected result is Negative.  Fact Sheet for Patients:  EntrepreneurPulse.com.au  Fact Sheet for Healthcare Providers:  IncredibleEmployment.be  This test is no t yet approved or cleared by the [place] FDA and  has been authorized for detection and/or diagnosis of SARS-CoV-2 by FDA under an Emergency Use Authorization (EUA). This EUA will remain  in effect (meaning this test can be used) for the duration of the COVID-19 declaration under Section 564(b)(1) of the Act, 21 U.S.C.section 360bbb-3(b)(1), unless the authorization is terminated  or revoked sooner.       Influenza A by PCR NEGATIVE NEGATIVE Final   Influenza B by PCR NEGATIVE NEGATIVE Final     Comment: (NOTE) The Xpert Xpress SARS-CoV-2/FLU/RSV plus assay is intended as an aid in the diagnosis of influenza from Nasopharyngeal swab specimens and should not be used as a sole basis for treatment. Nasal washings and aspirates are unacceptable for Xpert Xpress SARS-CoV-2/FLU/RSV testing.  Fact Sheet for Patients: EntrepreneurPulse.com.au  Fact Sheet for Healthcare Providers: IncredibleEmployment.be  This test is not yet approved or cleared by the [place] FDA and has been authorized for detection and/or diagnosis of SARS-CoV-2 by FDA under an Emergency Use Authorization (EUA). This EUA will remain in effect (meaning this test can be used) for the duration of the COVID-19 declaration under Section 564(b)(1) of the Act, 21 U.S.C. section 360bbb-3(b)(1), unless the authorization is terminated or revoked.  Performed at [facility], [address] [place], [place] [address]   Culture, blood (Routine X 2) w Reflex to ID Panel     Status: None (Preliminary result)   Collection Time: 11/11/20  6:04 AM   Specimen: BLOOD  Result Value Ref Range Status   Specimen Description BLOOD RIGHT ANTECUBITAL  Final   Special Requests   Final    BOTTLES DRAWN AEROBIC AND ANAEROBIC Blood Culture adequate volume   Culture   Final    NO GROWTH 1 DAY Performed at [facility], [address], [place], [place] [address]    Report Status PENDING  Incomplete  Culture, blood (Routine X 2) w Reflex to ID Panel     Status: None (Preliminary result)   Collection Time: 11/11/20  6:06 AM   Specimen: BLOOD  Result Value Ref Range Status   Specimen Description BLOOD RIGHT HAND  Final   Special Requests   Final    BOTTLES DRAWN AEROBIC ONLY Blood Culture adequate volume   Culture   Final    NO GROWTH 1 DAY Performed at [facility], [address], [place],  [address]    Report Status PENDING  Incomplete  Radiology Reports CT Abdomen Pelvis Wo  Contrast  Result Date: 11/10/2020 CLINICAL DATA:  87 year old female with right lower quadrant abdominal pain. EXAM: CT ABDOMEN AND PELVIS WITHOUT CONTRAST TECHNIQUE: Multidetector CT imaging of the abdomen and pelvis was performed following the standard protocol without IV contrast. COMPARISON:  CT abdomen pelvis dated 10/24/2019. FINDINGS: Evaluation of this exam is limited in the absence of intravenous contrast. Lower chest: Minimal bibasilar linear atelectasis/scarring. The visualized lung bases are otherwise clear. There is coronary vascular calcification and calcification of the mitral annulus. No intra-abdominal free air or free fluid. Hepatobiliary: The liver is unremarkable. No intrahepatic biliary dilatation. Cholecystectomy. No retained calcified stone noted in the central CBD. Pancreas: Unremarkable. No pancreatic ductal dilatation or surrounding inflammatory changes. Spleen: Small calcified splenic granuloma. The spleen is otherwise unremarkable. Adrenals/Urinary Tract: The adrenal glands are unremarkable. Several scattered vascular calcification versus small nonobstructing renal calculi measure up to 3 mm. There is no hydronephrosis on either side. The visualized ureters and urinary bladder appear unremarkable. Stomach/Bowel: There is severe sigmoid diverticulosis without active inflammatory changes. There is a 4 cm duodenal diverticulum without active inflammation. There is inflammatory changes involving the cecum and proximal ascending colon most consistent with colitis, likely infectious in etiology. Underlying mass is less likely. Clinical correlation and follow-up recommended. There is no bowel obstruction. The appendix is normal. Vascular/Lymphatic: There is advanced aortoiliac atherosclerotic disease. A fusiform infrarenal abdominal aortic aneurysm measures up to 3.9 cm in diameter similar to prior CT. Evaluation of the aorta and vasculature is limited in the absence of intravenous contrast.  The IVC is unremarkable. No portal venous gas. There is no adenopathy. Reproductive: Hysterectomy. A pessary is noted in the pelvis. Other: None Musculoskeletal: Osteopenia with degenerative changes of the spine. No acute osseous pathology. IMPRESSION: 1. Colitis involving the cecum and proximal ascending colon, likely infectious in etiology. Clinical correlation and follow-up recommended. No bowel obstruction. Normal appendix. 2. Severe sigmoid diverticulosis. 3. Fusiform infrarenal abdominal aortic aneurysm measuring up to 3.9 cm in diameter similar to prior CT. Recommend follow-up ultrasound every 2 years. This recommendation follows ACR consensus guidelines: White Paper of the ACR Incidental Findings Committee II on Vascular Findings. J Am Coll Radiol 2013; 32:202-542. 4. Aortic Atherosclerosis (ICD10-I70.0). Electronically Signed   By: Anner Crete M.D.   On: 11/10/2020 20:37     CBC Recent Labs  Lab 11/10/20 1800 11/11/20 0604 11/12/20 0650  WBC 15.9* 13.6* 15.4*  HGB 13.1 11.9* 11.7*  HCT 40.1 37.3 36.8  PLT 185 157 152  MCV 94.4 94.7 95.3  MCH 30.8 30.2 30.3  MCHC 32.7 31.9 31.8  RDW 11.9 11.6 11.8  LYMPHSABS 1.0  --   --   MONOABS 1.3*  --   --   EOSABS 0.0  --   --   BASOSABS 0.0  --   --     Chemistries  Recent Labs  Lab 11/10/20 1800 11/11/20 0604 11/12/20 0650  NA 136 135 135  K 4.1 3.8 3.8  CL 104 104 104  CO2 25 23 22   GLUCOSE 183* 157* 100*  BUN 28* 23 22  CREATININE 1.13* 1.02* 1.04*  CALCIUM 9.7 8.5* 8.1*  MG  --  1.2*  --   AST 15 15  --   ALT 12 11  --   ALKPHOS 65 52  --   BILITOT 0.7 0.5  --    ------------------------------------------------------------------------------------------------------------------ No results for input(s): CHOL, HDL, LDLCALC, TRIG, CHOLHDL, LDLDIRECT in the last 72 hours.  Lab Results  Component Value Date   HGBA1C 7.6 (H) 11/11/2020    ------------------------------------------------------------------------------------------------------------------ No results for input(s): TSH, T4TOTAL, T3FREE, THYROIDAB in the last 72 hours.  Invalid input(s): FREET3 ------------------------------------------------------------------------------------------------------------------ No results for input(s): VITAMINB12, FOLATE, FERRITIN, TIBC, IRON, RETICCTPCT in the last 72 hours.  Coagulation profile Recent Labs  Lab 11/11/20 0604  INR 1.1    No results for input(s): DDIMER in the last 72 hours.  Cardiac Enzymes No results for input(s): CKMB, TROPONINI, MYOGLOBIN in the last 168 hours.  Invalid input(s): CK ------------------------------------------------------------------------------------------------------------------ No results found for: BNP   Deatra James M.D on 11/12/2020 at 1:28 PM  Go to [email or web address] - for contact info  [facility] - Office  [phone]

## 2020-11-13 DIAGNOSIS — K529 Noninfective gastroenteritis and colitis, unspecified: Secondary | ICD-10-CM | POA: Diagnosis not present

## 2020-11-13 LAB — CBC WITH DIFFERENTIAL/PLATELET
Abs Immature Granulocytes: 0.09 10*3/uL — ABNORMAL HIGH (ref 0.00–0.07)
Basophils Absolute: 0 10*3/uL (ref 0.0–0.1)
Basophils Relative: 0 %
Eosinophils Absolute: 0.2 10*3/uL (ref 0.0–0.5)
Eosinophils Relative: 2 %
HCT: 36.4 % (ref 36.0–46.0)
Hemoglobin: 11.7 g/dL — ABNORMAL LOW (ref 12.0–15.0)
Immature Granulocytes: 1 %
Lymphocytes Relative: 12 %
Lymphs Abs: 1.5 10*3/uL (ref 0.7–4.0)
MCH: 30.8 pg (ref 26.0–34.0)
MCHC: 32.1 g/dL (ref 30.0–36.0)
MCV: 95.8 fL (ref 80.0–100.0)
Monocytes Absolute: 0.9 10*3/uL (ref 0.1–1.0)
Monocytes Relative: 8 %
Neutro Abs: 9.4 10*3/uL — ABNORMAL HIGH (ref 1.7–7.7)
Neutrophils Relative %: 77 %
Platelets: 160 10*3/uL (ref 150–400)
RBC: 3.8 MIL/uL — ABNORMAL LOW (ref 3.87–5.11)
RDW: 11.8 % (ref 11.5–15.5)
WBC: 12.2 10*3/uL — ABNORMAL HIGH (ref 4.0–10.5)
nRBC: 0 % (ref 0.0–0.2)

## 2020-11-13 LAB — GLUCOSE, CAPILLARY
Glucose-Capillary: 89 mg/dL (ref 70–99)
Glucose-Capillary: 85 mg/dL (ref 70–99)
Glucose-Capillary: 89 mg/dL (ref 70–99)
Glucose-Capillary: 100 mg/dL — ABNORMAL HIGH (ref 70–99)
Glucose-Capillary: 97 mg/dL (ref 70–99)

## 2020-11-13 MED ORDER — METRONIDAZOLE 500 MG/100ML IV SOLN
500.0000 mg | Freq: Two times a day (BID) | INTRAVENOUS | Status: DC
  Administered 2020-11-13 – 2020-11-14 (×2): 500 mg via INTRAVENOUS
  Filled 2020-11-13 (×2): qty 100

## 2020-11-13 MED ORDER — METHOCARBAMOL 500 MG PO TABS: 500.0000 mg | ORAL_TABLET | Freq: Four times a day (QID) | ORAL | Status: DC | PRN

## 2020-11-13 NOTE — Plan of Care (Signed)
  Problem: Acute Rehab PT Goals(only PT should resolve) Goal: Pt Will Go Supine/Side To Sit Outcome: Progressing Flowsheets (Taken 11/13/2020 1625) Pt will go Supine/Side to Sit: with supervision Goal: Patient Will Transfer Sit To/From Stand Outcome: Progressing Flowsheets (Taken 11/13/2020 1625) Patient will transfer sit to/from stand:  with supervision  with min guard assist Goal: Pt Will Transfer Bed To Chair/Chair To Bed Outcome: Progressing Flowsheets (Taken 11/13/2020 1625) Pt will Transfer Bed to Chair/Chair to Bed:  min guard assist  with min assist Goal: Pt Will Ambulate Outcome: Progressing Flowsheets (Taken 11/13/2020 1625) Pt will Ambulate:  50 feet  with min guard assist  with minimal assist  with rolling walker   4:26 PM, 11/13/20 Lonell Grandchild, MPT Physical Therapist with [facility] [phone] office [phone] mobile phone

## 2020-11-13 NOTE — Evaluation (Signed)
Physical Therapy Evaluation Patient Details Name: Ariana Shea MRN: [id number] DOB: 1933-10-08 Today's Date: 11/13/2020   History of Present Illness  Ariana Shea is a 88 y.o. female with medical history significant for hypertension, hyperlipidemia, GERD who presents to the emergency department due to right lower quadrant abdominal pain which started last night.  Patient states that she lifted some garbage in the afternoon yesterday, so when she started to feel the pain late last night/early this morning, she initially thought that she might have pulled some muscle when lifting the garbage.  Pain worsened on waking up this morning and was rated as 8/10 on pain scale, this was associated with nausea and vomiting x 1.  No alleviating/aggravating factors are known.  Patient endorsed prior history of cholecystectomy and hysterectomy.  She denies chest pain, shortness of breath, fever, chills, diarrhea.    Clinical Impression  Patient demonstrates slow labored movement for transferring to [facility], very unsteady on feet requiring use of RW for ambulation, limited mostly due to fatigue and c/o neck pain, on room air with SpO2 at 90% after walking back to bedside.  Patient tolerated sitting up in chair after therapy with her son in room.  Patient will benefit from continued physical therapy in hospital and recommended venue below to increase strength, balance, endurance for safe ADLs and gait.     Follow Up Recommendations SNF;Supervision for mobility/OOB;Supervision - Intermittent    Equipment Recommendations  None recommended by PT    Recommendations for Other Services       Precautions / Restrictions Precautions Precautions: Fall Restrictions Weight Bearing Restrictions: No      Mobility  Bed Mobility Overal bed mobility: Needs Assistance Bed Mobility: Supine to Sit     Supine to sit: Min guard     General bed mobility comments: increased time, labored movement    Transfers Overall  transfer level: Needs assistance   Transfers: Sit to/from Stand;Stand Pivot Transfers Sit to Stand: Min assist Stand pivot transfers: Min assist       General transfer comment: increased time, labored movement  Ambulation/Gait Ambulation/Gait assistance: Min assist Gait Distance (Feet): 20 Feet Assistive device: Rolling walker (2 wheeled) Gait Pattern/deviations: Decreased step length - left;Decreased stance time - right;Decreased stride length Gait velocity: decreased   General Gait Details: slow labored cadence without loss of balance, limited mostly due to c/o fatigue with SpO2 at 90% after ambulation while on room air  Stairs            Wheelchair Mobility    Modified Rankin (Stroke Patients Only)       Balance Overall balance assessment: Needs assistance Sitting-balance support: Feet supported;No upper extremity supported Sitting balance-Leahy Scale: Good Sitting balance - Comments: seated at EOB   Standing balance support: During functional activity;No upper extremity supported Standing balance-Leahy Scale: Poor Standing balance comment: fair using RW                             Pertinent Vitals/Pain Pain Assessment: Faces Faces Pain Scale: Hurts even more Pain Location: left side of neck Pain Descriptors / Indicators: Sore;Grimacing;Guarding Pain Intervention(s): Limited activity within patient's tolerance;Monitored during session;Repositioned    Home Living Family/patient expects to be discharged to:: Private residence Living Arrangements: Alone Available Help at Discharge: Family;Available PRN/intermittently Type of Home: Apartment Home Access: Elevator       Home Equipment: Walker - 4 wheels      Prior Function Level  of Independence: Independent         Comments: household ambulator without AD, "per patient"     Hand Dominance   Dominant Hand: Right    Extremity/Trunk Assessment   Upper Extremity Assessment Upper  Extremity Assessment: Generalized weakness    Lower Extremity Assessment Lower Extremity Assessment: Generalized weakness    Cervical / Trunk Assessment Cervical / Trunk Assessment: Normal  Communication   Communication: No difficulties  Cognition Arousal/Alertness: Awake/alert Behavior During Therapy: WFL for tasks assessed/performed Overall Cognitive Status: Within Functional Limits for tasks assessed                                        General Comments      Exercises     Assessment/Plan    PT Assessment Patient needs continued PT services  PT Problem List Decreased strength;Decreased activity tolerance;Decreased balance;Decreased mobility       PT Treatment Interventions DME instruction;Gait training;Functional mobility training;Stair training;Therapeutic activities;Patient/family education;Therapeutic exercise;Balance training    PT Goals (Current goals can be found in the Care Plan section)  Acute Rehab PT Goals Patient Stated Goal: return home with family to assist PT Goal Formulation: With patient/family Time For Goal Achievement: 11/27/20 Potential to Achieve Goals: Good    Frequency Min 3X/week   Barriers to discharge        Co-evaluation               AM-PAC PT "6 Clicks" Mobility  Outcome Measure Help needed turning from your back to your side while in a flat bed without using bedrails?: None Help needed moving from lying on your back to sitting on the side of a flat bed without using bedrails?: A Little Help needed moving to and from a bed to a chair (including a wheelchair)?: A Little Help needed standing up from a chair using your arms (e.g., wheelchair or bedside chair)?: A Little Help needed to walk in hospital room?: A Little Help needed climbing 3-5 steps with a railing? : A Lot 6 Click Score: 18    End of Session   Activity Tolerance: Patient tolerated treatment well;Patient limited by fatigue Patient left: in  chair;with call bell/phone within reach;with family/visitor present Nurse Communication: Mobility status PT Visit Diagnosis: Unsteadiness on feet (R26.81);Other abnormalities of gait and mobility (R26.89);Muscle weakness (generalized) (M62.81)    Time: 9292-4462 PT Time Calculation (min) (ACUTE ONLY): 32 min   Charges:   PT Evaluation $PT Eval Moderate Complexity: 1 Mod PT Treatments $Therapeutic Activity: 23-37 mins        4:24 PM, 11/13/20 Lonell Grandchild, MPT Physical Therapist with [facility] [phone] office [phone] mobile phone

## 2020-11-13 NOTE — Progress Notes (Addendum)
Patient Demographics:    Ariana Shea, is a 87 y.o. female, DOB - 12-24-1933, XLK:[id number]  Admit date - 11/10/2020   Admitting Physician Bernadette Hoit, DO  Outpatient Primary MD for the patient is [place], [place] At  LOS - 3   Chief Complaint  Patient presents with  . Abdominal Pain        Subjective:    Bary Castilla seen and examined this morning, complaining of severe generalized weakness unable to stand.  Also complaining of diffuse abdominal pain, unable to tolerate and advance her diet... Stating she is not feeling well. Hemodynamically stable    Assessment  & Plan :    Principal Problem:   Acute colitis Active Problems:   Hypertension   GERD (gastroesophageal reflux disease)   Nausea & vomiting   Abdominal aortic aneurysm (HCC)   Hyperlipidemia   Dehydration   Abdominal pain   Leukocytosis   Hyperglycemia due to diabetes mellitus [facility])  Brief Summary:-  87 y.o. female with medical history significant for HLD, HTN and GERD and DM2--admitted on 11/10/2020 with acute colitis presumed infectious   Assessment and plan  1) Acute colitis -Likely bacterial infection, -Patient continues to complain of abdominal pain, discomfort with nausea and poor appetite She has been on IV Rocephin and Flagyl>> we will continue (has penicillin allergy) -We will continue to encourage clear p.o. intake if she can tolerate -As needed analgesics and antiemetics  2)DM2 -Main stable, will monitor her CBG every 4 hours, with SSI coverage -Reduce Lantus to  10 units nightly until oral intake is more reliable -Adjust insulin as oral intake improves  3)GERD -continue Protonix  4)HTN-pressure was soft in past few days, now improving Continue her Coreg at 6.25 mg twice daily, will continue to hold amlodipine,    5)HFrEF/patient with history of combined systolic and diastolic  dysfunction CHF- -EF from May 2021 echo EF 40 to 45%, that particular echo also showed grade 1 diastolic dysfunction -Restarting IV fluids due to poor p.o. intake, will monitor closely due to CHF  6)HLD--- continue pravastatin  Disposition/Need for in-Hospital Stay -Patient continued to have symptoms of severe abdominal discomfort unable to tolerate p.o., with nausea. -- acute colitis requiring IV antiemetics, IV pain medications and IV antibiotics-*  Status is: Inpatient  Remains inpatient appropriate because:Please see disposition above   Disposition: The patient is from: Home              Anticipated d/c is to: Home              Anticipated d/c date is: 2 days              Patient currently is not medically stable to d/c. Barriers: Not Clinically Stable-   Code Status :  -  Code Status: Full Code   Family Communication:    NA (patient is alert, awake and coherent), also discussed with the patient's son at bedside  Consults  :  na  DVT Prophylaxis  :   - SCDs   enoxaparin (LOVENOX) injection 40 mg Start: 11/11/20 1000 SCDs Start: 11/11/20 0130    Lab Results  Component Value Date   PLT 160 11/13/2020    Inpatient Medications  Scheduled Meds: . carvedilol  6.25 mg Oral BID WC  . enoxaparin (LOVENOX) injection  40 mg Subcutaneous Q24H  . insulin aspart  0-15 Units Subcutaneous TID WC  . insulin aspart  0-5 Units Subcutaneous QHS  . insulin glargine  10 Units Subcutaneous QHS  . pantoprazole  40 mg Oral Daily  . pravastatin  80 mg Oral q morning   Continuous Infusions: . sodium chloride 75 mL/hr at 11/13/20 0231  . cefTRIAXone (ROCEPHIN)  IV 2 g (11/13/20 0915)  . metronidazole 500 mg (11/13/20 1120)   PRN Meds:.fentaNYL (SUBLIMAZE) injection, ondansetron (ZOFRAN) IV, oxyCODONE, prochlorperazine    Anti-infectives (From admission, onward)   Start     Dose/Rate Route Frequency Ordered Stop   11/12/20 0800  cefTRIAXone (ROCEPHIN) 2 g in sodium chloride 0.9 %  100 mL IVPB        2 g 200 mL/hr over 30 Minutes Intravenous Every 24 hours 11/11/20 1036     11/11/20 1200  metroNIDAZOLE (FLAGYL) IVPB 500 mg        500 mg 100 mL/hr over 60 Minutes Intravenous Every 8 hours 11/11/20 1033     11/11/20 1000  levofloxacin (LEVAQUIN) IVPB 750 mg  Status:  Discontinued        750 mg 100 mL/hr over 90 Minutes Intravenous Every 48 hours 11/11/20 0531 11/11/20 1009   11/10/20 2115  cefTRIAXone (ROCEPHIN) 2 g in sodium chloride 0.9 % 100 mL IVPB       "And" Linked Group Details   2 g 200 mL/hr over 30 Minutes Intravenous  Once 11/10/20 2103 11/10/20 2304   11/10/20 2115  metroNIDAZOLE (FLAGYL) IVPB 500 mg       "And" Linked Group Details   500 mg 100 mL/hr over 60 Minutes Intravenous  Once 11/10/20 2103 11/11/20 0031        Objective:   Vitals:   11/12/20 0623 11/12/20 1421 11/12/20 2020 11/13/20 0407  BP: (!) 116/51 (!) 116/51 (!) 139/53 (!) 140/55  Pulse: 84 72 84 77  Resp: 16 16 18 18   Temp: 98.1 F (36.7 C) 98.9 F (37.2 C) 98.3 F (36.8 C) 98.1 F (36.7 C)  TempSrc: Oral  Oral Oral  SpO2: 94% 92% 92% 100%  Weight:      Height:        Wt Readings from Last 3 Encounters:  11/10/20 70 kg  02/13/20 59.8 kg  10/30/19 62.9 kg     Intake/Output Summary (Last 24 hours) at 11/13/2020 1203 Last data filed at 11/13/2020 0231 Gross per 24 hour  Intake 1417.03 ml  Output --  Net 1417.03 ml     Physical Exam:   General:  Alert, oriented, cooperative, no distress;   HEENT:  Normocephalic, PERRL, otherwise with in Normal limits   Neuro:  CNII-XII intact. , normal motor and sensation, reflexes intact   Lungs:   Clear to auscultation BL, Respirations unlabored, no wheezes / crackles  Cardio:    S1/S2, RRR, No murmure, No Rubs or Gallops   Abdomen:   Soft, diffuse abdominal tenderness, bowel sounds active all four quadrants,  no guarding or peritoneal signs.  Muscular skeletal:   Severe generalized weaknesses, unable to stand on her own   Limited exam - in bed, able to move all 4 extremities, bilateral lower extremity restraint 2+ pulses,  symmetric, No pitting edema  Skin:  Dry, warm to touch, negative for any Rashes,  Wounds: Please see nursing documentation  Data Review:   Micro Results Recent Results (from the past 240 hour(s))  Resp Panel by RT-PCR (Flu A&B, Covid) Nasopharyngeal Swab     Status: None   Collection Time: 11/10/20 10:11 PM   Specimen: Nasopharyngeal Swab; Nasopharyngeal(NP) swabs in vial transport medium  Result Value Ref Range Status   SARS Coronavirus 2 by RT PCR NEGATIVE NEGATIVE Final    Comment: (NOTE) SARS-CoV-2 target nucleic acids are NOT DETECTED.  The SARS-CoV-2 RNA is generally detectable in upper respiratory specimens during the acute phase of infection. The lowest concentration of SARS-CoV-2 viral copies this assay can detect is 138 copies/mL. A negative result does not preclude SARS-Cov-2 infection and should not be used as the sole basis for treatment or other patient management decisions. A negative result may occur with  improper specimen collection/handling, submission of specimen other than nasopharyngeal swab, presence of viral mutation(s) within the areas targeted by this assay, and inadequate number of viral copies(<138 copies/mL). A negative result must be combined with clinical observations, patient history, and epidemiological information. The expected result is Negative.  Fact Sheet for Patients:  EntrepreneurPulse.com.au  Fact Sheet for Healthcare Providers:  IncredibleEmployment.be  This test is no t yet approved or cleared by the [place] FDA and  has been authorized for detection and/or diagnosis of SARS-CoV-2 by FDA under an Emergency Use Authorization (EUA). This EUA will remain  in effect (meaning this test can be used) for the duration of the COVID-19 declaration under Section 564(b)(1) of the  Act, 21 U.S.C.section 360bbb-3(b)(1), unless the authorization is terminated  or revoked sooner.       Influenza A by PCR NEGATIVE NEGATIVE Final   Influenza B by PCR NEGATIVE NEGATIVE Final    Comment: (NOTE) The Xpert Xpress SARS-CoV-2/FLU/RSV plus assay is intended as an aid in the diagnosis of influenza from Nasopharyngeal swab specimens and should not be used as a sole basis for treatment. Nasal washings and aspirates are unacceptable for Xpert Xpress SARS-CoV-2/FLU/RSV testing.  Fact Sheet for Patients: EntrepreneurPulse.com.au  Fact Sheet for Healthcare Providers: IncredibleEmployment.be  This test is not yet approved or cleared by the [place] FDA and has been authorized for detection and/or diagnosis of SARS-CoV-2 by FDA under an Emergency Use Authorization (EUA). This EUA will remain in effect (meaning this test can be used) for the duration of the COVID-19 declaration under Section 564(b)(1) of the Act, 21 U.S.C. section 360bbb-3(b)(1), unless the authorization is terminated or revoked.  Performed at [facility], [address] [place], [place] [address]   Culture, blood (Routine X 2) w Reflex to ID Panel     Status: None (Preliminary result)   Collection Time: 11/11/20  6:04 AM   Specimen: BLOOD  Result Value Ref Range Status   Specimen Description BLOOD RIGHT ANTECUBITAL  Final   Special Requests   Final    BOTTLES DRAWN AEROBIC AND ANAEROBIC Blood Culture adequate volume   Culture   Final    NO GROWTH 2 DAYS Performed at [facility], [address], [place], [place] [address]    Report Status PENDING  Incomplete  Culture, blood (Routine X 2) w Reflex to ID Panel     Status: None (Preliminary result)   Collection Time: 11/11/20  6:06 AM   Specimen: BLOOD  Result Value Ref Range Status   Specimen Description BLOOD RIGHT HAND  Final   Special Requests   Final    BOTTLES DRAWN AEROBIC ONLY Blood Culture adequate  volume  Culture   Final    NO GROWTH 2 DAYS Performed at [facility], [address] [place], [place] [address]    Report Status PENDING  Incomplete    Radiology Reports CT Abdomen Pelvis Wo Contrast  Result Date: 11/10/2020 CLINICAL DATA:  87 year old female with right lower quadrant abdominal pain. EXAM: CT ABDOMEN AND PELVIS WITHOUT CONTRAST TECHNIQUE: Multidetector CT imaging of the abdomen and pelvis was performed following the standard protocol without IV contrast. COMPARISON:  CT abdomen pelvis dated 10/24/2019. FINDINGS: Evaluation of this exam is limited in the absence of intravenous contrast. Lower chest: Minimal bibasilar linear atelectasis/scarring. The visualized lung bases are otherwise clear. There is coronary vascular calcification and calcification of the mitral annulus. No intra-abdominal free air or free fluid. Hepatobiliary: The liver is unremarkable. No intrahepatic biliary dilatation. Cholecystectomy. No retained calcified stone noted in the central CBD. Pancreas: Unremarkable. No pancreatic ductal dilatation or surrounding inflammatory changes. Spleen: Small calcified splenic granuloma. The spleen is otherwise unremarkable. Adrenals/Urinary Tract: The adrenal glands are unremarkable. Several scattered vascular calcification versus small nonobstructing renal calculi measure up to 3 mm. There is no hydronephrosis on either side. The visualized ureters and urinary bladder appear unremarkable. Stomach/Bowel: There is severe sigmoid diverticulosis without active inflammatory changes. There is a 4 cm duodenal diverticulum without active inflammation. There is inflammatory changes involving the cecum and proximal ascending colon most consistent with colitis, likely infectious in etiology. Underlying mass is less likely. Clinical correlation and follow-up recommended. There is no bowel obstruction. The appendix is normal. Vascular/Lymphatic: There is advanced aortoiliac atherosclerotic  disease. A fusiform infrarenal abdominal aortic aneurysm measures up to 3.9 cm in diameter similar to prior CT. Evaluation of the aorta and vasculature is limited in the absence of intravenous contrast. The IVC is unremarkable. No portal venous gas. There is no adenopathy. Reproductive: Hysterectomy. A pessary is noted in the pelvis. Other: None Musculoskeletal: Osteopenia with degenerative changes of the spine. No acute osseous pathology. IMPRESSION: 1. Colitis involving the cecum and proximal ascending colon, likely infectious in etiology. Clinical correlation and follow-up recommended. No bowel obstruction. Normal appendix. 2. Severe sigmoid diverticulosis. 3. Fusiform infrarenal abdominal aortic aneurysm measuring up to 3.9 cm in diameter similar to prior CT. Recommend follow-up ultrasound every 2 years. This recommendation follows ACR consensus guidelines: White Paper of the ACR Incidental Findings Committee II on Vascular Findings. J Am Coll Radiol 2013; 75:643-329. 4. Aortic Atherosclerosis (ICD10-I70.0). Electronically Signed   By: Anner Crete M.D.   On: 11/10/2020 20:37     CBC Recent Labs  Lab 11/10/20 1800 11/11/20 0604 11/12/20 0650 11/13/20 0908  WBC 15.9* 13.6* 15.4* 12.2*  HGB 13.1 11.9* 11.7* 11.7*  HCT 40.1 37.3 36.8 36.4  PLT 185 157 152 160  MCV 94.4 94.7 95.3 95.8  MCH 30.8 30.2 30.3 30.8  MCHC 32.7 31.9 31.8 32.1  RDW 11.9 11.6 11.8 11.8  LYMPHSABS 1.0  --   --  1.5  MONOABS 1.3*  --   --  0.9  EOSABS 0.0  --   --  0.2  BASOSABS 0.0  --   --  0.0    Chemistries  Recent Labs  Lab 11/10/20 1800 11/11/20 0604 11/12/20 0650  NA 136 135 135  K 4.1 3.8 3.8  CL 104 104 104  CO2 25 23 22   GLUCOSE 183* 157* 100*  BUN 28* 23 22  CREATININE 1.13* 1.02* 1.04*  CALCIUM 9.7 8.5* 8.1*  MG  --  1.2*  --  AST 15 15  --   ALT 12 11  --   ALKPHOS 65 52  --   BILITOT 0.7 0.5  --     ------------------------------------------------------------------------------------------------------------------ No results for input(s): CHOL, HDL, LDLCALC, TRIG, CHOLHDL, LDLDIRECT in the last 72 hours.  Lab Results  Component Value Date   HGBA1C 7.6 (H) 11/11/2020   ------------------------------------------------------------------------------------------------------------------ No results for input(s): TSH, T4TOTAL, T3FREE, THYROIDAB in the last 72 hours.  Invalid input(s): FREET3 ------------------------------------------------------------------------------------------------------------------ No results for input(s): VITAMINB12, FOLATE, FERRITIN, TIBC, IRON, RETICCTPCT in the last 72 hours.  Coagulation profile Recent Labs  Lab 11/11/20 0604  INR 1.1    No results for input(s): DDIMER in the last 72 hours.  Cardiac Enzymes No results for input(s): CKMB, TROPONINI, MYOGLOBIN in the last 168 hours.  Invalid input(s): CK ------------------------------------------------------------------------------------------------------------------ No results found for: BNP   Deatra James M.D on 11/13/2020 at 12:03 PM  Go to [email or web address] - for contact info  [facility] - Office  [phone]

## 2020-11-14 ENCOUNTER — Inpatient Hospital Stay (HOSPITAL_COMMUNITY): Payer: Medicare Other

## 2020-11-14 DIAGNOSIS — K529 Noninfective gastroenteritis and colitis, unspecified: Secondary | ICD-10-CM | POA: Diagnosis not present

## 2020-11-14 LAB — CBC
HCT: 35.6 % — ABNORMAL LOW (ref 36.0–46.0)
Hemoglobin: 11.5 g/dL — ABNORMAL LOW (ref 12.0–15.0)
MCH: 30.1 pg (ref 26.0–34.0)
MCHC: 32.3 g/dL (ref 30.0–36.0)
MCV: 93.2 fL (ref 80.0–100.0)
Platelets: 167 10*3/uL (ref 150–400)
RBC: 3.82 MIL/uL — ABNORMAL LOW (ref 3.87–5.11)
RDW: 11.6 % (ref 11.5–15.5)
WBC: 8.4 10*3/uL (ref 4.0–10.5)
nRBC: 0 % (ref 0.0–0.2)

## 2020-11-14 LAB — BASIC METABOLIC PANEL
Anion gap: 9 (ref 5–15)
BUN: 11 mg/dL (ref 8–23)
CO2: 23 mmol/L (ref 22–32)
Calcium: 7.6 mg/dL — ABNORMAL LOW (ref 8.9–10.3)
Chloride: 104 mmol/L (ref 98–111)
Creatinine, Ser: 0.65 mg/dL (ref 0.44–1.00)
GFR, Estimated: >60 mL/min (ref >60)
Glucose, Bld: 88 mg/dL (ref 70–99)
Potassium: 3 mmol/L — ABNORMAL LOW (ref 3.5–5.1)
Sodium: 136 mmol/L (ref 135–145)

## 2020-11-14 LAB — GLUCOSE, CAPILLARY
Glucose-Capillary: 99 mg/dL (ref 70–99)
Glucose-Capillary: 133 mg/dL — ABNORMAL HIGH (ref 70–99)
Glucose-Capillary: 112 mg/dL — ABNORMAL HIGH (ref 70–99)

## 2020-11-14 LAB — BRAIN NATRIURETIC PEPTIDE: B Natriuretic Peptide: 513 pg/mL — ABNORMAL HIGH (ref 0.0–100.0)

## 2020-11-14 MED ORDER — POTASSIUM CHLORIDE CRYS ER 20 MEQ PO TBCR
40.0000 meq | EXTENDED_RELEASE_TABLET | Freq: Once | ORAL | Status: AC
  Administered 2020-11-14: 40 meq via ORAL
  Filled 2020-11-14: qty 2

## 2020-11-14 MED ORDER — FENTANYL CITRATE (PF) 100 MCG/2ML IJ SOLN: 25.0000 ug | Freq: Four times a day (QID) | INTRAMUSCULAR | Status: DC | PRN

## 2020-11-14 MED ORDER — METRONIDAZOLE 500 MG PO TABS
500.0000 mg | ORAL_TABLET | Freq: Three times a day (TID) | ORAL | Status: DC
  Administered 2020-11-14 – 2020-11-16 (×6): 500 mg via ORAL
  Filled 2020-11-14 (×6): qty 1

## 2020-11-14 NOTE — Care Management Important Message (Signed)
Important Message  Patient Details  Name: Ariana Shea MRN: [id number] Date of Birth: 1934-03-14   Medicare Important Message Given:  Yes     Tommy Medal 11/14/2020, 12:05 PM

## 2020-11-14 NOTE — Progress Notes (Signed)
SATURATION QUALIFICATIONS: (This note is used to comply with regulatory documentation for home oxygen)  Patient Saturations on Room Air at Rest = 90%  Patient Saturations on Room Air while Ambulating = 90%   Please briefly explain why patient needs home oxygen:  Patient's O2 saturation on RA is 90% while at rest and while ambulating.  Patient showed no signs of respiratory distress.

## 2020-11-14 NOTE — TOC Initial Note (Signed)
Transition of Care [facility]) - Initial/Assessment Note    Patient Details  Name: Ariana Shea MRN: [id number] Date of Birth: 23-Aug-1933  Transition of Care [facility] CM/SW Contact:    Natasha Bence, LCSW Phone Number: 11/14/2020, 12:35 PM  Clinical Narrative:                 Patient is an 87 year old female admitted for Acute Colitis. CSW conducted initial assessment. Patient reported that she lives at home and is able to complete most ADL's independently but utilizes the help of her son regularly. Patient also reported that she could utilizes the assistance of her neighbors for additional needs. Patient declined SNF and is agreeable to [facility]. Patient acknowledge that her son is encouraging her to participate in SNF, but patient would still like to be discharged home with [facility] TOC to follow.  Expected Discharge Plan: [place] Barriers to Discharge: Barriers Resolved   Patient Goals and CMS Choice Patient states their goals for this hospitalization and ongoing recovery are:: rehab with [facility] CMS Medicare.gov Compare Post Acute Care list provided to:: Patient Choice offered to / list presented to : Patient  Expected Discharge Plan and Services Expected Discharge Plan: [place]       Living arrangements for the past 2 months: Apartment                                      Prior Living Arrangements/Services Living arrangements for the past 2 months: Apartment Lives with:: Self Patient language and need for interpreter reviewed:: Yes Do you feel safe going back to the place where you live?: Yes      Need for Family Participation in Patient Care: Yes (Comment) Care giver support system in place?: Yes (comment)   Criminal Activity/Legal Involvement Pertinent to Current Situation/Hospitalization: No - Comment as needed  Activities of Daily Living Home Assistive Devices/Equipment: Eyeglasses ADL Screening (condition at time of admission) Patient's  cognitive ability adequate to safely complete daily activities?: Yes Is the patient deaf or have difficulty hearing?: No Does the patient have difficulty seeing, even when wearing glasses/contacts?: No Does the patient have difficulty concentrating, remembering, or making decisions?: Yes Patient able to express need for assistance with ADLs?: Yes Does the patient have difficulty dressing or bathing?: No Independently performs ADLs?: Yes (appropriate for developmental age) Does the patient have difficulty walking or climbing stairs?: No Weakness of Legs: Right Weakness of Arms/Hands: None  Permission Sought/Granted   Permission granted to share information with : Yes, Verbal Permission Granted     Permission granted to share info w AGENCY: Local HH agency        Emotional Assessment     Affect (typically observed): Appropriate,Accepting Orientation: : Oriented to Self,Oriented to Place,Oriented to  Time,Oriented to Situation Alcohol / Substance Use: Not Applicable Psych Involvement: No (comment)  Admission diagnosis:  Colitis [K52.9] Acute colitis [K52.9] Patient Active Problem List   Diagnosis Date Noted  . Abdominal pain 11/11/2020  . Leukocytosis 11/11/2020  . Hyperglycemia due to diabetes mellitus ([place]) 11/11/2020  . Acute colitis 11/10/2020  . Syncope, vasovagal 10/30/2019  . Acute renal failure superimposed on stage 3 chronic kidney disease ([place]) 10/30/2019  . Dehydration   . Generalized weakness   . Hypotension due to hypovolemia   . AKI (acute kidney injury) ([place]) 10/24/2019  . Acute pyelonephritis 10/24/2019  .  Acute renal failure superimposed on stage 3b chronic kidney disease ([place]) 10/24/2019  . Hyperkalemia   . Skin infection, left side of neck near carotid endarterectomy scar 11/19/2015  . Hyperlipidemia 12/11/2014  . Abdominal aortic aneurysm ([place]) 06/30/2014  . Normocytic anemia 06/28/2014  . Hypophosphatemia 06/26/2014  . Hydronephrosis of right kidney  06/26/2014  . Renal stones 06/26/2014  . Hypokalemia 06/26/2014  . UTI (urinary tract infection) 06/23/2014  . Hypomagnesemia 06/23/2014  . Hypocalcemia 06/23/2014  . Metabolic acidosis 62/69/4854  . Hypertension 06/22/2014  . DM (diabetes mellitus), type 2 with peripheral vascular complications ([place]) 62/70/3500  . ARF (acute renal failure) ([place]) 06/22/2014  . PVD (peripheral vascular disease) ([place]) 06/22/2014  . GERD (gastroesophageal reflux disease) 06/22/2014  . Pure hypercholesterolemia 06/22/2014  . Nausea & vomiting 06/22/2014  . Dysphagia, unspecified(787.20) 11/23/2013  . HTN (hypertension) 06/07/2013  . Carotid artery disease ([place]) 11/30/2012  . Peripheral arterial disease ([place])   . COLONIC POLYPS 08/12/2010  . DM 08/12/2010  . ANEMIA, IRON DEFICIENCY 08/12/2010  . Peripheral vascular disease ([place]) 08/12/2010   PCP:  [place], [place]:   [id number] [place], [place] [address] Phone: [phone] Fax: [phone]  [place], [place] [place] [address] Phone: [phone] Fax: [phone]     Social Determinants of Health (SDOH) Interventions    Readmission Risk Interventions No flowsheet data found.

## 2020-11-14 NOTE — Progress Notes (Signed)
Patient Demographics:    Ariana Shea, is a 87 y.o. female, DOB - [birth year], WLN:[id number]  Admit date - 11/10/2020   Admitting Physician Bernadette Hoit, DO  Outpatient Primary MD for the patient is [place], [place] - 4   Chief Complaint  Patient presents with  . Abdominal Pain        Subjective:    Ariana Shea was seen and examined this morning reporting still having abdominal pain but much improved with pain medication... Having significant neck pain/muscle pain... Medical improvement with pain medication and muscle relaxant... Denies any shoulder or arm, asymmetric weakness Complain of generalized weaknesses, only can ambulate with assist No issues overnight hemodynamically stable    Assessment  & Plan :    Principal Problem:   Acute colitis Active Problems:   Hypertension   GERD (gastroesophageal reflux disease)   Nausea & vomiting   Abdominal aortic aneurysm (HCC)   Hyperlipidemia   Dehydration   Abdominal pain   Leukocytosis   Hyperglycemia due to diabetes mellitus [facility])  Brief Summary:-  87 y.o. female with medical history significant for HLD, HTN and GERD and DM2--admitted on 11/10/2020 with acute colitis presumed infectious   Assessment and plan  1) Acute colitis -Likely bacterial infection, -Improved abdominal pain with pain medications, -Improved nausea vomiting, tolerating diet, will advance today She has been on IV Rocephin and Flagyl>> we will continue (has penicillin allergy) -On clear liquid diet will be advanced as tolerated -As needed analgesics and antiemetics  2)DM2 -Monitoring CBG q. ACH S, SSI coverage -Reduce Lantus to  10 units nightly until oral intake is more reliable -Adjust insulin as oral intake improves  3)GERD -continue Protonix --we will hold as patient is on dual antibiotics reducing risk of C.  difficile  4)HTN-pressure was soft in past few days, now improving Continue her Coreg at 6.25 mg twice daily, will continue to hold amlodipine,    5)HFrEF/patient with history of combined systolic and diastolic dysfunction CHF- -EF from May 2021 echo EF 40 to 45%, that particular echo also showed grade 1 diastolic dysfunction -Restarting IV fluids due to poor p.o. intake, will monitor closely due to CHF  6)HLD--- continue pravastatin  7) Neck pain -Seems to be musculoskeletal, recommended adjusting pillow, -As needed analgesics, muscle relaxants -We will obtain neck x-ray today 11/14/2020  8) severe debility -Patient can only ambulate with assist, needing assist with all ADLs -Discussed with patient and his son over the phone-patient lives alone, patient's son lives in a far distance... -PT recommending SNF (at this point patient is reluctant to except SNF) open to home health   disposition/Need for in-Hospital Stay -Patient continued to have symptoms of severe abdominal discomfort unable to tolerate p.o., with nausea. -- acute colitis requiring IV antiemetics, IV pain medications and IV antibiotics-*  Status is: Inpatient  Remains inpatient appropriate because:Please see disposition above   Disposition: The patient is from: Home              Anticipated d/c is to: Home with home health versus SNF              Anticipated d/c date is: 2 days  Patient currently is not medically stable to d/c. Barriers: Not Clinically Stable-   Code Status :  -  Code Status: Full Code   Family Communication:    NA (patient is alert, awake and coherent), also discussed with the patient's son at bedside  Consults  :  na  DVT Prophylaxis  :   - SCDs   enoxaparin (LOVENOX) injection 40 mg Start: 11/11/20 1000 SCDs Start: 11/11/20 0130    Lab Results  Component Value Date   PLT 167 11/14/2020    Inpatient Medications  Scheduled Meds: . carvedilol  6.25 mg Oral BID WC  .  enoxaparin (LOVENOX) injection  40 mg Subcutaneous Q24H  . insulin aspart  0-15 Units Subcutaneous TID WC  . insulin aspart  0-5 Units Subcutaneous QHS  . insulin glargine  10 Units Subcutaneous QHS  . metroNIDAZOLE  500 mg Oral Q8H  . pravastatin  80 mg Oral q morning   Continuous Infusions: . cefTRIAXone (ROCEPHIN)  IV 2 g (11/13/20 0915)   PRN Meds:.fentaNYL (SUBLIMAZE) injection, methocarbamol, ondansetron (ZOFRAN) IV, oxyCODONE, prochlorperazine    Anti-infectives (From admission, onward)   Start     Dose/Rate Route Frequency Ordered Stop   11/14/20 1400  metroNIDAZOLE (FLAGYL) tablet 500 mg        500 mg Oral Every 8 hours 11/14/20 1107     11/13/20 2200  metroNIDAZOLE (FLAGYL) IVPB 500 mg  Status:  Discontinued        500 mg 100 mL/hr over 60 Minutes Intravenous Every 12 hours 11/13/20 1239 11/14/20 1107   11/12/20 0800  cefTRIAXone (ROCEPHIN) 2 g in sodium chloride 0.9 % 100 mL IVPB        2 g 200 mL/hr over 30 Minutes Intravenous Every 24 hours 11/11/20 1036     11/11/20 1200  metroNIDAZOLE (FLAGYL) IVPB 500 mg  Status:  Discontinued        500 mg 100 mL/hr over 60 Minutes Intravenous Every 8 hours 11/11/20 1033 11/13/20 1239   11/11/20 1000  levofloxacin (LEVAQUIN) IVPB 750 mg  Status:  Discontinued        750 mg 100 mL/hr over 90 Minutes Intravenous Every 48 hours 11/11/20 0531 11/11/20 1009   11/10/20 2115  cefTRIAXone (ROCEPHIN) 2 g in sodium chloride 0.9 % 100 mL IVPB       "And" Linked Group Details   2 g 200 mL/hr over 30 Minutes Intravenous  Once 11/10/20 2103 11/10/20 2304   11/10/20 2115  metroNIDAZOLE (FLAGYL) IVPB 500 mg       "And" Linked Group Details   500 mg 100 mL/hr over 60 Minutes Intravenous  Once 11/10/20 2103 11/11/20 0031        Objective:   Vitals:   11/13/20 0407 11/13/20 1400 11/13/20 1800 11/14/20 0307  BP: (!) 140/55 (!) 142/61  (!) 138/49  Pulse: 77 74  81  Resp: 18 18  19   Temp: 98.1 F (36.7 C) 98.4 F (36.9 C)  98.4 F (36.9  C)  TempSrc: Oral Oral    SpO2: 100% 97% 93% 92%  Weight:      Height:        Wt Readings from Last 3 Encounters:  11/10/20 70 kg  02/13/20 59.8 kg  10/30/19 62.9 kg     Intake/Output Summary (Last 24 hours) at 11/14/2020 1108 Last data filed at 11/14/2020 0900 Gross per 24 hour  Intake 1807.39 ml  Output --  Net 1807.39 ml      Physical  Exam:   General:  Alert, oriented, cooperative, mild-moderate distress complaining of neck pain  HEENT:   Neck/neck muscle pain, range of motion intact  normocephalic, PERRL, otherwise with in Normal limits   Neuro:  CNII-XII intact. , normal motor and sensation, reflexes intact   Lungs:   Clear to auscultation BL, Respirations unlabored, no wheezes / crackles  Cardio:    S1/S2, RRR, No murmure, No Rubs or Gallops   Abdomen:    Improved abdominal tenderness, remained soft bowel sounds active all four quadrants,  no guarding or peritoneal signs.  Muscular skeletal:   Severe generalized weaknesses- needs assistance with mobility  Limited exam - in bed, able to move all 4 extremities,  2+ pulses,  symmetric, No pitting edema  Skin:  Dry, warm to touch, negative for any Rashes,  Wounds: Please see nursing documentation              Data Review:   Micro Results Recent Results (from the past 240 hour(s))  Resp Panel by RT-PCR (Flu A&B, Covid) Nasopharyngeal Swab     Status: None   Collection Time: 11/10/20 10:11 PM   Specimen: Nasopharyngeal Swab; Nasopharyngeal(NP) swabs in vial transport medium  Result Value Ref Range Status   SARS Coronavirus 2 by RT PCR NEGATIVE NEGATIVE Final    Comment: (NOTE) SARS-CoV-2 target nucleic acids are NOT DETECTED.  The SARS-CoV-2 RNA is generally detectable in upper respiratory specimens during the acute phase of infection. The lowest concentration of SARS-CoV-2 viral copies this assay can detect is 138 copies/mL. A negative result does not preclude SARS-Cov-2 infection and should not be used  as the sole basis for treatment or other patient management decisions. A negative result may occur with  improper specimen collection/handling, submission of specimen other than nasopharyngeal swab, presence of viral mutation(s) within the areas targeted by this assay, and inadequate number of viral copies(<138 copies/mL). A negative result must be combined with clinical observations, patient history, and epidemiological information. The expected result is Negative.  Fact Sheet for Patients:  EntrepreneurPulse.com.au  Fact Sheet for Healthcare Providers:  IncredibleEmployment.be  This test is no t yet approved or cleared by the [place] FDA and  has been authorized for detection and/or diagnosis of SARS-CoV-2 by FDA under an Emergency Use Authorization (EUA). This EUA will remain  in effect (meaning this test can be used) for the duration of the COVID-19 declaration under Section 564(b)(1) of the Act, 21 U.S.C.section 360bbb-3(b)(1), unless the authorization is terminated  or revoked sooner.       Influenza A by PCR NEGATIVE NEGATIVE Final   Influenza B by PCR NEGATIVE NEGATIVE Final    Comment: (NOTE) The Xpert Xpress SARS-CoV-2/FLU/RSV plus assay is intended as an aid in the diagnosis of influenza from Nasopharyngeal swab specimens and should not be used as a sole basis for treatment. Nasal washings and aspirates are unacceptable for Xpert Xpress SARS-CoV-2/FLU/RSV testing.  Fact Sheet for Patients: EntrepreneurPulse.com.au  Fact Sheet for Healthcare Providers: IncredibleEmployment.be  This test is not yet approved or cleared by the [place] FDA and has been authorized for detection and/or diagnosis of SARS-CoV-2 by FDA under an Emergency Use Authorization (EUA). This EUA will remain in effect (meaning this test can be used) for the duration of the COVID-19 declaration under Section 564(b)(1)  of the Act, 21 U.S.C. section 360bbb-3(b)(1), unless the authorization is terminated or revoked.  Performed at [facility], [address] [place], [place] [address]   Culture, blood (Routine  X 2) w Reflex to ID Panel     Status: None (Preliminary result)   Collection Time: 11/11/20  6:04 AM   Specimen: BLOOD  Result Value Ref Range Status   Specimen Description BLOOD RIGHT ANTECUBITAL  Final   Special Requests   Final    BOTTLES DRAWN AEROBIC AND ANAEROBIC Blood Culture adequate volume   Culture   Final    NO GROWTH 3 DAYS Performed at [facility], [address] [place], [place] [address]    Report Status PENDING  Incomplete  Culture, blood (Routine X 2) w Reflex to ID Panel     Status: None (Preliminary result)   Collection Time: 11/11/20  6:06 AM   Specimen: BLOOD  Result Value Ref Range Status   Specimen Description BLOOD RIGHT HAND  Final   Special Requests   Final    BOTTLES DRAWN AEROBIC ONLY Blood Culture adequate volume   Culture   Final    NO GROWTH 3 DAYS Performed at [facility], [address] [place], [place] [address]    Report Status PENDING  Incomplete    Radiology Reports CT Abdomen Pelvis Wo Contrast  Result Date: 11/10/2020 CLINICAL DATA:  87 year old female with right lower quadrant abdominal pain. EXAM: CT ABDOMEN AND PELVIS WITHOUT CONTRAST TECHNIQUE: Multidetector CT imaging of the abdomen and pelvis was performed following the standard protocol without IV contrast. COMPARISON:  CT abdomen pelvis dated 10/24/2019. FINDINGS: Evaluation of this exam is limited in the absence of intravenous contrast. Lower chest: Minimal bibasilar linear atelectasis/scarring. The visualized lung bases are otherwise clear. There is coronary vascular calcification and calcification of the mitral annulus. No intra-abdominal free air or free fluid. Hepatobiliary: The liver is unremarkable. No intrahepatic biliary dilatation. Cholecystectomy. No retained calcified stone noted in  the central CBD. Pancreas: Unremarkable. No pancreatic ductal dilatation or surrounding inflammatory changes. Spleen: Small calcified splenic granuloma. The spleen is otherwise unremarkable. Adrenals/Urinary Tract: The adrenal glands are unremarkable. Several scattered vascular calcification versus small nonobstructing renal calculi measure up to 3 mm. There is no hydronephrosis on either side. The visualized ureters and urinary bladder appear unremarkable. Stomach/Bowel: There is severe sigmoid diverticulosis without active inflammatory changes. There is a 4 cm duodenal diverticulum without active inflammation. There is inflammatory changes involving the cecum and proximal ascending colon most consistent with colitis, likely infectious in etiology. Underlying mass is less likely. Clinical correlation and follow-up recommended. There is no bowel obstruction. The appendix is normal. Vascular/Lymphatic: There is advanced aortoiliac atherosclerotic disease. A fusiform infrarenal abdominal aortic aneurysm measures up to 3.9 cm in diameter similar to prior CT. Evaluation of the aorta and vasculature is limited in the absence of intravenous contrast. The IVC is unremarkable. No portal venous gas. There is no adenopathy. Reproductive: Hysterectomy. A pessary is noted in the pelvis. Other: None Musculoskeletal: Osteopenia with degenerative changes of the spine. No acute osseous pathology. IMPRESSION: 1. Colitis involving the cecum and proximal ascending colon, likely infectious in etiology. Clinical correlation and follow-up recommended. No bowel obstruction. Normal appendix. 2. Severe sigmoid diverticulosis. 3. Fusiform infrarenal abdominal aortic aneurysm measuring up to 3.9 cm in diameter similar to prior CT. Recommend follow-up ultrasound every 2 years. This recommendation follows ACR consensus guidelines: White Paper of the ACR Incidental Findings Committee II on Vascular Findings. J Am Coll Radiol 2013; 82:500-370. 4.  Aortic Atherosclerosis (ICD10-I70.0). Electronically Signed   By: Anner Crete M.D.   On: 11/10/2020 20:37     CBC Recent Labs  Lab  11/10/20 1800 11/11/20 0604 11/12/20 0650 11/13/20 0908 11/14/20 0451  WBC 15.9* 13.6* 15.4* 12.2* 8.4  HGB 13.1 11.9* 11.7* 11.7* 11.5*  HCT 40.1 37.3 36.8 36.4 35.6*  PLT 185 157 152 160 167  MCV 94.4 94.7 95.3 95.8 93.2  MCH 30.8 30.2 30.3 30.8 30.1  MCHC 32.7 31.9 31.8 32.1 32.3  RDW 11.9 11.6 11.8 11.8 11.6  LYMPHSABS 1.0  --   --  1.5  --   MONOABS 1.3*  --   --  0.9  --   EOSABS 0.0  --   --  0.2  --   BASOSABS 0.0  --   --  0.0  --     Chemistries  Recent Labs  Lab 11/10/20 1800 11/11/20 0604 11/12/20 0650 11/14/20 0451  NA 136 135 135 136  K 4.1 3.8 3.8 3.0*  CL 104 104 104 104  CO2 25 23 22 23   GLUCOSE 183* 157* 100* 88  BUN 28* 23 22 11   CREATININE 1.13* 1.02* 1.04* 0.65  CALCIUM 9.7 8.5* 8.1* 7.6*  MG  --  1.2*  --   --   AST 15 15  --   --   ALT 12 11  --   --   ALKPHOS 65 52  --   --   BILITOT 0.7 0.5  --   --    ------------------------------------------------------------------------------------------------------------------ No results for input(s): CHOL, HDL, LDLCALC, TRIG, CHOLHDL, LDLDIRECT in the last 72 hours.  Lab Results  Component Value Date   HGBA1C 7.6 (H) 11/11/2020   ------------------------------------------------------------------------------------------------------------------ No results for input(s): TSH, T4TOTAL, T3FREE, THYROIDAB in the last 72 hours.  Invalid input(s): FREET3 ------------------------------------------------------------------------------------------------------------------ No results for input(s): VITAMINB12, FOLATE, FERRITIN, TIBC, IRON, RETICCTPCT in the last 72 hours.  Coagulation profile Recent Labs  Lab 11/11/20 0604  INR 1.1    No results for input(s): DDIMER in the last 72 hours.  Cardiac Enzymes No results for input(s): CKMB, TROPONINI, MYOGLOBIN in the  last 168 hours.  Invalid input(s): CK ------------------------------------------------------------------------------------------------------------------    Component Value Date/Time   BNP 513.0 (H) 11/14/2020 0451     Deatra James M.D on 11/14/2020 at 11:08 AM  Go to [email or web address] - for contact info  [facility] - Office  [phone]

## 2020-11-15 DIAGNOSIS — K529 Noninfective gastroenteritis and colitis, unspecified: Secondary | ICD-10-CM | POA: Diagnosis not present

## 2020-11-15 LAB — BASIC METABOLIC PANEL WITH GFR
Anion gap: 10 (ref 5–15)
BUN: 9 mg/dL (ref 8–23)
CO2: 26 mmol/L (ref 22–32)
Calcium: 8.2 mg/dL — ABNORMAL LOW (ref 8.9–10.3)
Chloride: 96 mmol/L — ABNORMAL LOW (ref 98–111)
Creatinine, Ser: 0.68 mg/dL (ref 0.44–1.00)
GFR, Estimated: >60 mL/min
Glucose, Bld: 97 mg/dL (ref 70–99)
Potassium: 3 mmol/L — ABNORMAL LOW (ref 3.5–5.1)
Sodium: 132 mmol/L — ABNORMAL LOW (ref 135–145)

## 2020-11-15 LAB — GLUCOSE, CAPILLARY
Glucose-Capillary: 104 mg/dL — ABNORMAL HIGH (ref 70–99)
Glucose-Capillary: 106 mg/dL — ABNORMAL HIGH (ref 70–99)
Glucose-Capillary: 110 mg/dL — ABNORMAL HIGH (ref 70–99)
Glucose-Capillary: 93 mg/dL (ref 70–99)
Glucose-Capillary: 97 mg/dL (ref 70–99)
Glucose-Capillary: 84 mg/dL (ref 70–99)
Glucose-Capillary: 87 mg/dL (ref 70–99)

## 2020-11-15 LAB — CBC
HCT: 40 % (ref 36.0–46.0)
Hemoglobin: 13.5 g/dL (ref 12.0–15.0)
MCH: 30.6 pg (ref 26.0–34.0)
MCHC: 33.8 g/dL (ref 30.0–36.0)
MCV: 90.7 fL (ref 80.0–100.0)
Platelets: 193 K/uL (ref 150–400)
RBC: 4.41 MIL/uL (ref 3.87–5.11)
RDW: 11.7 % (ref 11.5–15.5)
WBC: 10 K/uL (ref 4.0–10.5)
nRBC: 0 % (ref 0.0–0.2)

## 2020-11-15 MED ORDER — METHOCARBAMOL 500 MG PO TABS
500.0000 mg | ORAL_TABLET | Freq: Three times a day (TID) | ORAL | Status: DC
  Administered 2020-11-15 – 2020-11-16 (×3): 500 mg via ORAL
  Filled 2020-11-15 (×2): qty 1

## 2020-11-15 MED ORDER — MAGNESIUM SULFATE 2 GM/50ML IV SOLN
2.0000 g | Freq: Once | INTRAVENOUS | Status: AC
  Administered 2020-11-15: 2 g via INTRAVENOUS
  Filled 2020-11-15: qty 50

## 2020-11-15 MED ORDER — POTASSIUM CHLORIDE 20 MEQ PO PACK
40.0000 meq | PACK | Freq: Once | ORAL | Status: AC
  Administered 2020-11-15: 40 meq via ORAL
  Filled 2020-11-15: qty 2

## 2020-11-15 NOTE — Progress Notes (Signed)
Patient Demographics:    Ariana Shea, is a 87 y.o. female, DOB - 1933-07-17, PPI:[id number]  Admit date - 11/10/2020   Admitting Physician Bernadette Hoit, DO  Outpatient Primary MD for the patient is [place], [place] - 5   Chief Complaint  Patient presents with  . Abdominal Pain        Subjective:    Ariana Shea was seen and examined this morning, reporting improved abdominal pain with current medications, still complaining of significant neck/shoulder pain unable to turn her head.  X-ray was completed yesterday reviewed with patient--chronic arthritic changes, negative for any fractures or abnormalities    Assessment  & Plan :    Principal Problem:   Acute colitis Active Problems:   Hypertension   GERD (gastroesophageal reflux disease)   Nausea & vomiting   Abdominal aortic aneurysm (HCC)   Hyperlipidemia   Dehydration   Abdominal pain   Leukocytosis   Hyperglycemia due to diabetes mellitus [facility])  Brief Summary:-  87 y.o. female with medical history significant for HLD, HTN and GERD and DM2--admitted on 11/10/2020 with acute colitis presumed infectious   Assessment and plan  1) Acute colitis -Likely bacterial infection, -Improved abdominal pain with current pain medications -Improved nausea vomiting, willing to advance diet today  She has been on IV Rocephin and Flagyl>> we will continue (has penicillin allergy) -As needed analgesics and antiemetics  2)DM2 -Monitoring CBG q. ACH S, SSI coverage -Reduce Lantus to  10 units nightly until oral intake is more reliable -CBG stable -Adjust insulin as oral intake improves   3)GERD -stable,-we will hold as patient is on dual antibiotics reducing risk of C. difficile  4)HTN-pressure was soft in past few days, now improving Continue her Coreg at 6.25 mg twice daily, will continue to hold amlodipine,     5)HFrEF/patient with history of combined systolic and diastolic dysfunction CHF- -EF from May 2021 echo EF 40 to 45%, that particular echo also showed grade 1 diastolic dysfunction -Restarting IV fluids due to poor p.o. intake, will monitor closely due to CHF  6)HLD--- continue pravastatin  7) Neck pain -Seems to be musculoskeletal, recommended adjusting pillow, -As needed analgesics, schedule muscle relaxant for better relief, cold, warm compresses -We will obtain neck x-ray today 11/14/2020 --revealed chronic Multilevel degenerative disease in the cervical spine. No acute abnormality  8) severe debility -Patient can only ambulate with assist, needing assist with all ADLs -Discussed with patient and his son over the phone-patient lives alone, patient's son lives in a far distance... -PT recommending SNF  open to home health Patient is refusing SNF   Disposition/Need for in-Hospital Stay -Patient continued to have symptoms of severe abdominal discomfort unable to tolerate p.o., with nausea. -- acute colitis requiring IV antiemetics, IV pain medications and IV antibiotics-*  Status is: Inpatient  Remains inpatient appropriate because:Please see disposition above   Disposition: The patient is from: Home              Anticipated d/c is to: Home with home health versus SNF              Anticipated d/c date is: 2 days  Patient currently is not medically stable to d/c. Barriers: Not Clinically Stable-   Code Status :  -  Code Status: Full Code   Family Communication:    NA (patient is alert, awake and coherent), also discussed with the patient's son at bedside  Consults  :  na  DVT Prophylaxis  :   - SCDs   enoxaparin (LOVENOX) injection 40 mg Start: 11/11/20 1000 SCDs Start: 11/11/20 0130    Lab Results  Component Value Date   PLT 193 11/15/2020    Inpatient Medications  Scheduled Meds: . carvedilol  6.25 mg Oral BID WC  . enoxaparin (LOVENOX) injection   40 mg Subcutaneous Q24H  . insulin aspart  0-15 Units Subcutaneous TID WC  . insulin aspart  0-5 Units Subcutaneous QHS  . insulin glargine  10 Units Subcutaneous QHS  . methocarbamol  500 mg Oral Q8H  . metroNIDAZOLE  500 mg Oral Q8H  . pravastatin  80 mg Oral q morning   Continuous Infusions: . cefTRIAXone (ROCEPHIN)  IV 2 g (11/15/20 0833)  . magnesium sulfate bolus IVPB 2 g (11/15/20 1029)   PRN Meds:.fentaNYL (SUBLIMAZE) injection, ondansetron (ZOFRAN) IV, oxyCODONE, prochlorperazine    Anti-infectives (From admission, onward)   Start     Dose/Rate Route Frequency Ordered Stop   11/14/20 1400  metroNIDAZOLE (FLAGYL) tablet 500 mg        500 mg Oral Every 8 hours 11/14/20 1107     11/13/20 2200  metroNIDAZOLE (FLAGYL) IVPB 500 mg  Status:  Discontinued        500 mg 100 mL/hr over 60 Minutes Intravenous Every 12 hours 11/13/20 1239 11/14/20 1107   11/12/20 0800  cefTRIAXone (ROCEPHIN) 2 g in sodium chloride 0.9 % 100 mL IVPB        2 g 200 mL/hr over 30 Minutes Intravenous Every 24 hours 11/11/20 1036     11/11/20 1200  metroNIDAZOLE (FLAGYL) IVPB 500 mg  Status:  Discontinued        500 mg 100 mL/hr over 60 Minutes Intravenous Every 8 hours 11/11/20 1033 11/13/20 1239   11/11/20 1000  levofloxacin (LEVAQUIN) IVPB 750 mg  Status:  Discontinued        750 mg 100 mL/hr over 90 Minutes Intravenous Every 48 hours 11/11/20 0531 11/11/20 1009   11/10/20 2115  cefTRIAXone (ROCEPHIN) 2 g in sodium chloride 0.9 % 100 mL IVPB       "And" Linked Group Details   2 g 200 mL/hr over 30 Minutes Intravenous  Once 11/10/20 2103 11/10/20 2304   11/10/20 2115  metroNIDAZOLE (FLAGYL) IVPB 500 mg       "And" Linked Group Details   500 mg 100 mL/hr over 60 Minutes Intravenous  Once 11/10/20 2103 11/11/20 0031        Objective:   Vitals:   11/14/20 0307 11/14/20 1313 11/14/20 2044 11/15/20 0609  BP: (!) 138/49 (!) 146/67 (!) 146/62 137/81  Pulse: 81 85 84 94  Resp: 19 16 19 19   Temp:  98.4 F (36.9 C) 98.6 F (37 C) 98.2 F (36.8 C) 98.4 F (36.9 C)  TempSrc:  Oral  Oral  SpO2: 92% 93% 91% 90%  Weight:      Height:        Wt Readings from Last 3 Encounters:  11/10/20 70 kg  02/13/20 59.8 kg  10/30/19 62.9 kg     Intake/Output Summary (Last 24 hours) at 11/15/2020 1115 Last data filed at 11/15/2020 3557 Gross  per 24 hour  Intake 1930.87 ml  Output --  Net 1930.87 ml       Physical Exam:   General:  Alert, oriented, cooperative, complaining of significant shoulder and neck pain  HEENT:  Normocephalic, PERRL, otherwise with in Normal limits   Neuro:  CNII-XII intact. , normal motor and sensation, reflexes intact   Lungs:   Clear to auscultation BL, Respirations unlabored, no wheezes / crackles  Cardio:    S1/S2, RRR, No murmure, No Rubs or Gallops   Abdomen:   Soft, non-tender, bowel sounds active all four quadrants,  no guarding or peritoneal signs.  Muscular skeletal:   Severe generalized weaknesses, ambulates only with assist  Limited exam - in bed, able to move all 4 extremities, reduced symmetric strength in lower extremities,  2+ pulses,  symmetric, No pitting edema  Skin:  Dry, warm to touch, negative for any Rashes,  Wounds: Please see nursing documentation              Data Review:   Micro Results Recent Results (from the past 240 hour(s))  Resp Panel by RT-PCR (Flu A&B, Covid) Nasopharyngeal Swab     Status: None   Collection Time: 11/10/20 10:11 PM   Specimen: Nasopharyngeal Swab; Nasopharyngeal(NP) swabs in vial transport medium  Result Value Ref Range Status   SARS Coronavirus 2 by RT PCR NEGATIVE NEGATIVE Final    Comment: (NOTE) SARS-CoV-2 target nucleic acids are NOT DETECTED.  The SARS-CoV-2 RNA is generally detectable in upper respiratory specimens during the acute phase of infection. The lowest concentration of SARS-CoV-2 viral copies this assay can detect is 138 copies/mL. A negative result does not preclude  SARS-Cov-2 infection and should not be used as the sole basis for treatment or other patient management decisions. A negative result may occur with  improper specimen collection/handling, submission of specimen other than nasopharyngeal swab, presence of viral mutation(s) within the areas targeted by this assay, and inadequate number of viral copies(<138 copies/mL). A negative result must be combined with clinical observations, patient history, and epidemiological information. The expected result is Negative.  Fact Sheet for Patients:  EntrepreneurPulse.com.au  Fact Sheet for Healthcare Providers:  IncredibleEmployment.be  This test is no t yet approved or cleared by the [place] FDA and  has been authorized for detection and/or diagnosis of SARS-CoV-2 by FDA under an Emergency Use Authorization (EUA). This EUA will remain  in effect (meaning this test can be used) for the duration of the COVID-19 declaration under Section 564(b)(1) of the Act, 21 U.S.C.section 360bbb-3(b)(1), unless the authorization is terminated  or revoked sooner.       Influenza A by PCR NEGATIVE NEGATIVE Final   Influenza B by PCR NEGATIVE NEGATIVE Final    Comment: (NOTE) The Xpert Xpress SARS-CoV-2/FLU/RSV plus assay is intended as an aid in the diagnosis of influenza from Nasopharyngeal swab specimens and should not be used as a sole basis for treatment. Nasal washings and aspirates are unacceptable for Xpert Xpress SARS-CoV-2/FLU/RSV testing.  Fact Sheet for Patients: EntrepreneurPulse.com.au  Fact Sheet for Healthcare Providers: IncredibleEmployment.be  This test is not yet approved or cleared by the [place] FDA and has been authorized for detection and/or diagnosis of SARS-CoV-2 by FDA under an Emergency Use Authorization (EUA). This EUA will remain in effect (meaning this test can be used) for the duration of  the COVID-19 declaration under Section 564(b)(1) of the Act, 21 U.S.C. section 360bbb-3(b)(1), unless the authorization is terminated or revoked.  Performed  at [facility], [address], [place], [place] [address]   Culture, blood (Routine X 2) w Reflex to ID Panel     Status: None (Preliminary result)   Collection Time: 11/11/20  6:04 AM   Specimen: BLOOD  Result Value Ref Range Status   Specimen Description BLOOD RIGHT ANTECUBITAL  Final   Special Requests   Final    BOTTLES DRAWN AEROBIC AND ANAEROBIC Blood Culture adequate volume   Culture   Final    NO GROWTH 4 DAYS Performed at [facility], [address], [place], [place] [address]    Report Status PENDING  Incomplete  Culture, blood (Routine X 2) w Reflex to ID Panel     Status: None (Preliminary result)   Collection Time: 11/11/20  6:06 AM   Specimen: BLOOD  Result Value Ref Range Status   Specimen Description BLOOD RIGHT HAND  Final   Special Requests   Final    BOTTLES DRAWN AEROBIC ONLY Blood Culture adequate volume   Culture   Final    NO GROWTH 4 DAYS Performed at [facility], [address], [place], [place] [address]    Report Status PENDING  Incomplete    Radiology Reports CT Abdomen Pelvis Wo Contrast  Result Date: 11/10/2020 CLINICAL DATA:  87 year old female with right lower quadrant abdominal pain. EXAM: CT ABDOMEN AND PELVIS WITHOUT CONTRAST TECHNIQUE: Multidetector CT imaging of the abdomen and pelvis was performed following the standard protocol without IV contrast. COMPARISON:  CT abdomen pelvis dated 10/24/2019. FINDINGS: Evaluation of this exam is limited in the absence of intravenous contrast. Lower chest: Minimal bibasilar linear atelectasis/scarring. The visualized lung bases are otherwise clear. There is coronary vascular calcification and calcification of the mitral annulus. No intra-abdominal free air or free fluid. Hepatobiliary: The liver is unremarkable. No intrahepatic biliary dilatation.  Cholecystectomy. No retained calcified stone noted in the central CBD. Pancreas: Unremarkable. No pancreatic ductal dilatation or surrounding inflammatory changes. Spleen: Small calcified splenic granuloma. The spleen is otherwise unremarkable. Adrenals/Urinary Tract: The adrenal glands are unremarkable. Several scattered vascular calcification versus small nonobstructing renal calculi measure up to 3 mm. There is no hydronephrosis on either side. The visualized ureters and urinary bladder appear unremarkable. Stomach/Bowel: There is severe sigmoid diverticulosis without active inflammatory changes. There is a 4 cm duodenal diverticulum without active inflammation. There is inflammatory changes involving the cecum and proximal ascending colon most consistent with colitis, likely infectious in etiology. Underlying mass is less likely. Clinical correlation and follow-up recommended. There is no bowel obstruction. The appendix is normal. Vascular/Lymphatic: There is advanced aortoiliac atherosclerotic disease. A fusiform infrarenal abdominal aortic aneurysm measures up to 3.9 cm in diameter similar to prior CT. Evaluation of the aorta and vasculature is limited in the absence of intravenous contrast. The IVC is unremarkable. No portal venous gas. There is no adenopathy. Reproductive: Hysterectomy. A pessary is noted in the pelvis. Other: None Musculoskeletal: Osteopenia with degenerative changes of the spine. No acute osseous pathology. IMPRESSION: 1. Colitis involving the cecum and proximal ascending colon, likely infectious in etiology. Clinical correlation and follow-up recommended. No bowel obstruction. Normal appendix. 2. Severe sigmoid diverticulosis. 3. Fusiform infrarenal abdominal aortic aneurysm measuring up to 3.9 cm in diameter similar to prior CT. Recommend follow-up ultrasound every 2 years. This recommendation follows ACR consensus guidelines: White Paper of the ACR Incidental Findings Committee II on  Vascular Findings. J Am Coll Radiol 2013; 10:626-948. 4. Aortic Atherosclerosis (ICD10-I70.0). Electronically Signed   By: Anner Crete  M.D.   On: 11/10/2020 20:37   DG Neck Soft Tissue  Result Date: 11/14/2020 CLINICAL DATA:  Left-sided neck pain. EXAM: NECK SOFT TISSUES - 1+ VIEW COMPARISON:  CT 12/24/2013 FINDINGS: Alignment of the cervical spine is within normal limits and stable. Severe degenerative endplate changes in the cervical spine at C4 through C7. Prevertebral soft tissues are normal. Bilateral facet arthropathy. No evidence for a fracture. Patient is edentulous. IMPRESSION: Multilevel degenerative disease in the cervical spine. No acute abnormality. Electronically Signed   By: Markus Daft M.D.   On: 11/14/2020 14:38     CBC Recent Labs  Lab 11/10/20 1800 11/11/20 0604 11/12/20 0650 11/13/20 0908 11/14/20 0451 11/15/20 0615  WBC 15.9* 13.6* 15.4* 12.2* 8.4 10.0  HGB 13.1 11.9* 11.7* 11.7* 11.5* 13.5  HCT 40.1 37.3 36.8 36.4 35.6* 40.0  PLT 185 157 152 160 167 193  MCV 94.4 94.7 95.3 95.8 93.2 90.7  MCH 30.8 30.2 30.3 30.8 30.1 30.6  MCHC 32.7 31.9 31.8 32.1 32.3 33.8  RDW 11.9 11.6 11.8 11.8 11.6 11.7  LYMPHSABS 1.0  --   --  1.5  --   --   MONOABS 1.3*  --   --  0.9  --   --   EOSABS 0.0  --   --  0.2  --   --   BASOSABS 0.0  --   --  0.0  --   --     Chemistries  Recent Labs  Lab 11/10/20 1800 11/11/20 0604 11/12/20 0650 11/14/20 0451 11/15/20 0615  NA 136 135 135 136 132*  K 4.1 3.8 3.8 3.0* 3.0*  CL 104 104 104 104 96*  CO2 25 23 22 23 26   GLUCOSE 183* 157* 100* 88 97  BUN 28* 23 22 11 9   CREATININE 1.13* 1.02* 1.04* 0.65 0.68  CALCIUM 9.7 8.5* 8.1* 7.6* 8.2*  MG  --  1.2*  --   --   --   AST 15 15  --   --   --   ALT 12 11  --   --   --   ALKPHOS 65 52  --   --   --   BILITOT 0.7 0.5  --   --   --    ------------------------------------------------------------------------------------------------------------------ No results for input(s): CHOL,  HDL, LDLCALC, TRIG, CHOLHDL, LDLDIRECT in the last 72 hours.  Lab Results  Component Value Date   HGBA1C 7.6 (H) 11/11/2020   ------------------------------------------------------------------------------------------------------------------ No results for input(s): TSH, T4TOTAL, T3FREE, THYROIDAB in the last 72 hours.  Invalid input(s): FREET3 ------------------------------------------------------------------------------------------------------------------ No results for input(s): VITAMINB12, FOLATE, FERRITIN, TIBC, IRON, RETICCTPCT in the last 72 hours.  Coagulation profile Recent Labs  Lab 11/11/20 0604  INR 1.1    No results for input(s): DDIMER in the last 72 hours.  Cardiac Enzymes No results for input(s): CKMB, TROPONINI, MYOGLOBIN in the last 168 hours.  Invalid input(s): CK ------------------------------------------------------------------------------------------------------------------    Component Value Date/Time   BNP 513.0 (H) 11/14/2020 0451     Deatra James M.D on 11/15/2020 at 11:15 AM  Go to [email or web address] - for contact info  [facility] - Office  [phone]

## 2020-11-15 NOTE — TOC Progression Note (Signed)
Transition of Care [facility]) - Progression Note    Patient Details  Name: Ariana Shea MRN: [id number] Date of Birth: [birth year]  Transition of Care [facility]) CM/SW Contact  Natasha Bence, LCSW Phone Number: 11/15/2020, 3:37 PM  Clinical Narrative:    Patient declined SNF. Patient and family agreeable to [facility]. CSW referred patient to [place] with [facility]. Corene Cornea agreeable to provide services to patient.   Expected Discharge Plan: [place] Barriers to Discharge: Barriers Resolved  Expected Discharge Plan and Services Expected Discharge Plan: [place]       Living arrangements for the past 2 months: Apartment                                       Social Determinants of Health (SDOH) Interventions    Readmission Risk Interventions No flowsheet data found.

## 2020-11-15 NOTE — Progress Notes (Signed)
accucheck was 87 for patient. She is scheduled for 10 units of lantus. Per Dr. Clearence Ped ok to hold Lantus

## 2020-11-16 DIAGNOSIS — K529 Noninfective gastroenteritis and colitis, unspecified: Secondary | ICD-10-CM | POA: Diagnosis not present

## 2020-11-16 LAB — GLUCOSE, CAPILLARY
Glucose-Capillary: 81 mg/dL (ref 70–99)
Glucose-Capillary: 91 mg/dL (ref 70–99)
Glucose-Capillary: 107 mg/dL — ABNORMAL HIGH (ref 70–99)

## 2020-11-16 LAB — CBC
HCT: 40.4 % (ref 36.0–46.0)
Hemoglobin: 13.2 g/dL (ref 12.0–15.0)
MCH: 29.9 pg (ref 26.0–34.0)
MCHC: 32.7 g/dL (ref 30.0–36.0)
MCV: 91.4 fL (ref 80.0–100.0)
Platelets: 193 10*3/uL (ref 150–400)
RBC: 4.42 MIL/uL (ref 3.87–5.11)
RDW: 11.4 % — ABNORMAL LOW (ref 11.5–15.5)
WBC: 10.8 10*3/uL — ABNORMAL HIGH (ref 4.0–10.5)
nRBC: 0 % (ref 0.0–0.2)

## 2020-11-16 MED ORDER — CARVEDILOL 6.25 MG PO TABS: 6.2500 mg | ORAL_TABLET | Freq: Two times a day (BID) | ORAL | 3 refills | Status: DC

## 2020-11-16 MED ORDER — CIPROFLOXACIN HCL 500 MG PO TABS: 500.0000 mg | ORAL_TABLET | Freq: Two times a day (BID) | ORAL | 0 refills | Status: AC

## 2020-11-16 MED ORDER — INSULIN ASPART 100 UNIT/ML IJ SOLN: 0.0000 [IU] | Freq: Three times a day (TID) | INTRAMUSCULAR | 11 refills | Status: DC

## 2020-11-16 MED ORDER — METRONIDAZOLE 500 MG PO TABS: 500.0000 mg | ORAL_TABLET | Freq: Three times a day (TID) | ORAL | 0 refills | Status: AC

## 2020-11-16 MED ORDER — METHOCARBAMOL 500 MG PO TABS: 500.0000 mg | ORAL_TABLET | Freq: Three times a day (TID) | ORAL | 0 refills | Status: AC

## 2020-11-16 MED ORDER — CIPROFLOXACIN HCL 250 MG PO TABS: 500.0000 mg | ORAL_TABLET | Freq: Two times a day (BID) | ORAL | Status: DC

## 2020-11-16 MED ORDER — OXYCODONE HCL 5 MG PO TABS: 5.0000 mg | ORAL_TABLET | Freq: Four times a day (QID) | ORAL | 0 refills | Status: AC | PRN

## 2020-11-16 MED ORDER — FLORANEX PO PACK: 1.0000 g | PACK | Freq: Three times a day (TID) | ORAL | 0 refills | Status: AC

## 2020-11-16 MED ORDER — FLORANEX PO PACK
1.0000 g | PACK | Freq: Three times a day (TID) | ORAL | Status: DC
  Filled 2020-11-16 (×4): qty 1

## 2020-11-16 NOTE — Progress Notes (Signed)
Patient refused to ambulate in hallway d/t neck pain.

## 2020-11-16 NOTE — Plan of Care (Signed)
  Problem: Education: Goal: Knowledge of General Education information will improve Description: Including pain rating scale, medication(s)/side effects and non-pharmacologic comfort measures Outcome: Progressing   Problem: Clinical Measurements: Goal: Ability to maintain clinical measurements within normal limits will improve Outcome: Progressing   

## 2020-11-16 NOTE — Plan of Care (Signed)
  Problem: Education: Goal: Knowledge of General Education information will improve Description: Including pain rating scale, medication(s)/side effects and non-pharmacologic comfort measures 11/16/2020 1013 by Santa Lighter, RN Outcome: Adequate for Discharge 11/16/2020 1012 by Santa Lighter, RN Outcome: Progressing   Problem: Clinical Measurements: Goal: Ability to maintain clinical measurements within normal limits will improve 11/16/2020 1013 by Santa Lighter, RN Outcome: Adequate for Discharge 11/16/2020 1012 by Santa Lighter, RN Outcome: Progressing Goal: Will remain free from infection Outcome: Adequate for Discharge Goal: Diagnostic test results will improve Outcome: Adequate for Discharge Goal: Respiratory complications will improve Outcome: Adequate for Discharge Goal: Cardiovascular complication will be avoided Outcome: Adequate for Discharge   Problem: Activity: Goal: Risk for activity intolerance will decrease Outcome: Adequate for Discharge   Problem: Nutrition: Goal: Adequate nutrition will be maintained Outcome: Adequate for Discharge   Problem: Coping: Goal: Level of anxiety will decrease Outcome: Adequate for Discharge   Problem: Elimination: Goal: Will not experience complications related to bowel motility Outcome: Adequate for Discharge Goal: Will not experience complications related to urinary retention Outcome: Adequate for Discharge   Problem: Pain Managment: Goal: General experience of comfort will improve Outcome: Adequate for Discharge   Problem: Safety: Goal: Ability to remain free from injury will improve Outcome: Adequate for Discharge   Problem: Skin Integrity: Goal: Risk for impaired skin integrity will decrease Outcome: Adequate for Discharge

## 2020-11-16 NOTE — TOC Transition Note (Signed)
Transition of Care [facility]) - CM/SW Discharge Note   Patient Details  Name: LORANDA MASTEL MRN: [id number] Date of Birth: [birth year]  Transition of Care [facility]) CM/SW Contact:  Natasha Bence, LCSW Phone Number: 11/16/2020, 3:09 PM   Clinical Narrative:    CSW notified of patient's readiness for discharge. CSW notified [facility]ne Cornea with [facility]. Corene Cornea agreeable to provide services. TOC signing off.   Final next level of care: [place] Barriers to Discharge: Barriers Resolved   Patient Goals and CMS Choice Patient states their goals for this hospitalization and ongoing recovery are:: Return home with [facility] CMS Medicare.gov Compare Post Acute Care list provided to:: Patient Choice offered to / list presented to : Patient  Discharge Placement                    Patient and family notified of of transfer: 11/16/20  Discharge Plan and Services                          HH Arranged: RN,PT   Date [place]: 11/16/20 Time [place]: 1509 Representative spoke with at [place]: [place] ([place]) Interventions     Readmission Risk Interventions No flowsheet data found.

## 2020-11-16 NOTE — Discharge Summary (Signed)
Physician Discharge Summary [facility]    Patient: Ariana Shea                   Admit date: 11/10/2020   DOB: Dec 16, 1933             Discharge date:11/16/2020/10:53 AM [id number]                          PCP: [facility]  Disposition: HOME with Home Health   Recommendations for Outpatient Follow-up:   . Follow up: With PCP in 2 weeks. . Continue monitoring her blood sugar closely continue current Lantus and sliding scale insulin . Patient is to continue current dual antibiotic therapy for colitis . Aggressive PT OT, home health has been arranged . Specific instructions regarding narcotics to be taken with precautions, side effects were discussed  Discharge Condition: Stable   Code Status:   Code Status: Full Code  Diet recommendation: Diabetic diet   Discharge Diagnoses:    Principal Problem:   Acute colitis Active Problems:   Hypertension   GERD (gastroesophageal reflux disease)   Nausea & vomiting   Abdominal aortic aneurysm (HCC)   Hyperlipidemia   Dehydration   Abdominal pain   Leukocytosis   Hyperglycemia due to diabetes mellitus ([place])   History of Present Illness/ Hospital Course Kathleen Argue Summary:     Brief Summary:- 87 y.o.femalewith medical history significant for HLD,HTN and GERD and DM2--admitted on 11/10/2020 with acute colitis presumed infectious     1) Acute colitis -Likely bacterial infection, -Improved abdominal pain with current pain medications -Improved nausea vomiting, willing to advance diet today  She has been on IV Rocephin and Flagyl>>> switch to p.o. Cipro and Flagyl (has penicillin allergy) -As needed analgesics and antiemetics  2)DM2 -Monitoring CBG q. ACH S, SSI coverage -Reduce Lantus to  10 units nightly until oral intake is more reliable -CBG stable -NovoLog sliding scale prescription given   3)GERD -stable,-we will hold as patient is on dual antibiotics reducing  risk of C. difficile  4)HTN-pressure was soft in past few days, now improving Continue her Coreg at 6.25 mg twice daily, will continue to hold amlodipine,    5)HFrEF/patient with history of combined systolic and diastolic dysfunction CHF- -EF from May 2021 echo EF 40 to 45%, that particular echo also showed grade 1 diastolic dysfunction -Restarting IV fluids due to poor p.o. intake, will monitor closely due to CHF  6)HLD--- continue pravastatin  7) Neck pain -Seems to be musculoskeletal, recommended adjusting pillow, -As needed analgesics, schedule muscle relaxant for better relief, cold, warm compresses -We will obtain neck x-ray today 11/14/2020 --revealed chronic Multilevel degenerative disease in the cervical spine. No acute abnormality  8) severe debility -Patient can only ambulate with assist, needing assist with all ADLs -Discussed with patient and his son over the phone-patient lives alone, patient's son lives in a far distance... -PT recommending SNF  open to home health Patient is refusing SNF .Marland KitchenMarland Kitchen Patient refused, accepted home health      Disposition: The patient is from: Home  Anticipated d/c is to: Home with home health v Family Communication: Discussed with patient and her son at bedside Risk of unplanned readmission Score:  High   Discharge Instructions:   Discharge Instructions    Activity as tolerated - No restrictions   Complete by: As directed    Call MD for:  difficulty breathing, headache or visual disturbances  Complete by: As directed    Call MD for:  persistant dizziness or light-headedness   Complete by: As directed    Call MD for:  redness, tenderness, or signs of infection (pain, swelling, redness, odor or green/yellow discharge around incision site)   Complete by: As directed    Call MD for:  temperature >100.4   Complete by: As directed    Diet - low sodium heart healthy   Complete by: As directed    Discharge  instructions   Complete by: As directed    Continue current medication as prescribed, fall precautions, aggressive PT OT   Increase activity slowly   Complete by: As directed        Medication List    STOP taking these medications   magnesium oxide 400 MG tablet Commonly known as: MAG-OX   omeprazole 20 MG capsule Commonly known as: PRILOSEC     TAKE these medications   acetaminophen 325 MG tablet Commonly known as: TYLENOL Take 2 tablets (650 mg total) by mouth every 6 (six) hours as needed for mild pain (or Fever >/= 101).   amLODipine 5 MG tablet Commonly known as: NORVASC Take 1 tablet by mouth daily.   carvedilol 6.25 MG tablet Commonly known as: COREG Take 1 tablet (6.25 mg total) by mouth 2 (two) times daily with a meal. What changed:   medication strength  how much to take   cilostazol 50 MG tablet Commonly known as: PLETAL TAKE  (1)  TABLET TWICE A DAY.   ciprofloxacin 500 MG tablet Commonly known as: CIPRO Take 1 tablet (500 mg total) by mouth 2 (two) times daily for 6 days.   insulin aspart 100 UNIT/ML injection Commonly known as: novoLOG Inject 0-15 Units into the skin 3 (three) times daily with meals.   insulin glargine 100 UNIT/ML Solostar Pen Commonly known as: LANTUS Inject 25-30 Units into the skin daily. 25u daily will increase to 30u if bs is over 200 What changed: Another medication with the same name was removed. Continue taking this medication, and follow the directions you see here.   lactobacillus Pack Take 1 packet (1 g total) by mouth 3 (three) times daily with meals for 10 days.   methocarbamol 500 MG tablet Commonly known as: ROBAXIN Take 1 tablet (500 mg total) by mouth every 8 (eight) hours for 10 days.   metroNIDAZOLE 500 MG tablet Commonly known as: FLAGYL Take 1 tablet (500 mg total) by mouth every 8 (eight) hours for 6 days.   moxifloxacin 0.5 % ophthalmic solution Commonly known as: VIGAMOX Place into the right  eye.   oxyCODONE 5 MG immediate release tablet Commonly known as: Oxy IR/ROXICODONE Take 1 tablet (5 mg total) by mouth every 6 (six) hours as needed for up to 3 days for severe pain.   pravastatin 80 MG tablet Commonly known as: PRAVACHOL Take 1 tablet (80 mg total) by mouth every morning.   vitamin C 500 MG tablet Commonly known as: ASCORBIC ACID Take 500 mg by mouth 2 (two) times daily.       Follow-up Information    [facility] Follow up.   Why: RN, PT,  Contact information: [id number] [place] [address] [phone]             Allergies  Allergen Reactions  . Lisinopril     Other reaction(s): Other (See Comments)  Decreased kidney functions  . Red Blood Cells Other (See Comments)  Patient is a Restaurant manager, fast food  . Penicillins Rash  . Sulfonamide Derivatives Rash     Procedures /Studies:   CT Abdomen Pelvis Wo Contrast  Result Date: 11/10/2020 CLINICAL DATA:  87 year old female with right lower quadrant abdominal pain. EXAM: CT ABDOMEN AND PELVIS WITHOUT CONTRAST TECHNIQUE: Multidetector CT imaging of the abdomen and pelvis was performed following the standard protocol without IV contrast. COMPARISON:  CT abdomen pelvis dated 10/24/2019. FINDINGS: Evaluation of this exam is limited in the absence of intravenous contrast. Lower chest: Minimal bibasilar linear atelectasis/scarring. The visualized lung bases are otherwise clear. There is coronary vascular calcification and calcification of the mitral annulus. No intra-abdominal free air or free fluid. Hepatobiliary: The liver is unremarkable. No intrahepatic biliary dilatation. Cholecystectomy. No retained calcified stone noted in the central CBD. Pancreas: Unremarkable. No pancreatic ductal dilatation or surrounding inflammatory changes. Spleen: Small calcified splenic granuloma. The spleen is otherwise unremarkable. Adrenals/Urinary Tract: The adrenal glands are unremarkable. Several  scattered vascular calcification versus small nonobstructing renal calculi measure up to 3 mm. There is no hydronephrosis on either side. The visualized ureters and urinary bladder appear unremarkable. Stomach/Bowel: There is severe sigmoid diverticulosis without active inflammatory changes. There is a 4 cm duodenal diverticulum without active inflammation. There is inflammatory changes involving the cecum and proximal ascending colon most consistent with colitis, likely infectious in etiology. Underlying mass is less likely. Clinical correlation and follow-up recommended. There is no bowel obstruction. The appendix is normal. Vascular/Lymphatic: There is advanced aortoiliac atherosclerotic disease. A fusiform infrarenal abdominal aortic aneurysm measures up to 3.9 cm in diameter similar to prior CT. Evaluation of the aorta and vasculature is limited in the absence of intravenous contrast. The IVC is unremarkable. No portal venous gas. There is no adenopathy. Reproductive: Hysterectomy. A pessary is noted in the pelvis. Other: None Musculoskeletal: Osteopenia with degenerative changes of the spine. No acute osseous pathology. IMPRESSION: 1. Colitis involving the cecum and proximal ascending colon, likely infectious in etiology. Clinical correlation and follow-up recommended. No bowel obstruction. Normal appendix. 2. Severe sigmoid diverticulosis. 3. Fusiform infrarenal abdominal aortic aneurysm measuring up to 3.9 cm in diameter similar to prior CT. Recommend follow-up ultrasound every 2 years. This recommendation follows ACR consensus guidelines: White Paper of the ACR Incidental Findings Committee II on Vascular Findings. J Am Coll Radiol 2013; 19:509-326. 4. Aortic Atherosclerosis (ICD10-I70.0). Electronically Signed   By: Anner Crete M.D.   On: 11/10/2020 20:37   DG Neck Soft Tissue  Result Date: 11/14/2020 CLINICAL DATA:  Left-sided neck pain. EXAM: NECK SOFT TISSUES - 1+ VIEW COMPARISON:  CT  12/24/2013 FINDINGS: Alignment of the cervical spine is within normal limits and stable. Severe degenerative endplate changes in the cervical spine at C4 through C7. Prevertebral soft tissues are normal. Bilateral facet arthropathy. No evidence for a fracture. Patient is edentulous. IMPRESSION: Multilevel degenerative disease in the cervical spine. No acute abnormality. Electronically Signed   By: Markus Daft M.D.   On: 11/14/2020 14:38    Subjective:   Patient was seen and examined 11/16/2020, 10:53 AM Patient stable today. No acute distress.  No issues overnight Stable for discharge.  Discharge Exam:    Vitals:   11/15/20 0609 11/15/20 1628 11/15/20 2023 11/16/20 0514  BP: 137/81 127/70 139/60 130/70  Pulse: 94 80 83 83  Resp: 19 18 18 17   Temp: 98.4 F (36.9 C) 98.2 F (36.8 C) 98 F (36.7 C) (!) 97.4 F (36.3 C)  TempSrc: Oral Oral Oral Oral  SpO2:  90% 94% 93% 92%  Weight:      Height:        General: Pt lying comfortably in bed & appears in no obvious distress. Cardiovascular: S1 & S2 heard, RRR, S1/S2 +. No murmurs, rubs, gallops or clicks. No JVD or pedal edema. Respiratory: Clear to auscultation without wheezing, rhonchi or crackles. No increased work of breathing. Abdominal:  Non-distended, non-tender & soft. No organomegaly or masses appreciated. Normal bowel sounds heard. CNS: Alert and oriented. No focal deficits. Extremities: no edema, no cyanosis      The results of significant diagnostics from this hospitalization (including imaging, microbiology, ancillary and laboratory) are listed below for reference.      Microbiology:   Recent Results (from the past 240 hour(s))  Resp Panel by RT-PCR (Flu A&B, Covid) Nasopharyngeal Swab     Status: None   Collection Time: 11/10/20 10:11 PM   Specimen: Nasopharyngeal Swab; Nasopharyngeal(NP) swabs in vial transport medium  Result Value Ref Range Status   SARS Coronavirus 2 by RT PCR NEGATIVE NEGATIVE Final     Comment: (NOTE) SARS-CoV-2 target nucleic acids are NOT DETECTED.  The SARS-CoV-2 RNA is generally detectable in upper respiratory specimens during the acute phase of infection. The lowest concentration of SARS-CoV-2 viral copies this assay can detect is 138 copies/mL. A negative result does not preclude SARS-Cov-2 infection and should not be used as the sole basis for treatment or other patient management decisions. A negative result may occur with  improper specimen collection/handling, submission of specimen other than nasopharyngeal swab, presence of viral mutation(s) within the areas targeted by this assay, and inadequate number of viral copies(<138 copies/mL). A negative result must be combined with clinical observations, patient history, and epidemiological information. The expected result is Negative.  Fact Sheet for Patients:  EntrepreneurPulse.com.au  Fact Sheet for Healthcare Providers:  IncredibleEmployment.be  This test is no t yet approved or cleared by the [place] FDA and  has been authorized for detection and/or diagnosis of SARS-CoV-2 by FDA under an Emergency Use Authorization (EUA). This EUA will remain  in effect (meaning this test can be used) for the duration of the COVID-19 declaration under Section 564(b)(1) of the Act, 21 U.S.C.section 360bbb-3(b)(1), unless the authorization is terminated  or revoked sooner.       Influenza A by PCR NEGATIVE NEGATIVE Final   Influenza B by PCR NEGATIVE NEGATIVE Final    Comment: (NOTE) The Xpert Xpress SARS-CoV-2/FLU/RSV plus assay is intended as an aid in the diagnosis of influenza from Nasopharyngeal swab specimens and should not be used as a sole basis for treatment. Nasal washings and aspirates are unacceptable for Xpert Xpress SARS-CoV-2/FLU/RSV testing.  Fact Sheet for Patients: EntrepreneurPulse.com.au  Fact Sheet for Healthcare  Providers: IncredibleEmployment.be  This test is not yet approved or cleared by the [place] FDA and has been authorized for detection and/or diagnosis of SARS-CoV-2 by FDA under an Emergency Use Authorization (EUA). This EUA will remain in effect (meaning this test can be used) for the duration of the COVID-19 declaration under Section 564(b)(1) of the Act, 21 U.S.C. section 360bbb-3(b)(1), unless the authorization is terminated or revoked.  Performed at [facility], [address], [place], [place] [address]   Culture, blood (Routine X 2) w Reflex to ID Panel     Status: None (Preliminary result)   Collection Time: 11/11/20  6:04 AM   Specimen: BLOOD  Result Value Ref Range Status   Specimen Description BLOOD RIGHT ANTECUBITAL  Final  Special Requests   Final    BOTTLES DRAWN AEROBIC AND ANAEROBIC Blood Culture adequate volume   Culture   Final    NO GROWTH 4 DAYS Performed at [facility], [address] [place], [place] [address]    Report Status PENDING  Incomplete  Culture, blood (Routine X 2) w Reflex to ID Panel     Status: None (Preliminary result)   Collection Time: 11/11/20  6:06 AM   Specimen: BLOOD  Result Value Ref Range Status   Specimen Description BLOOD RIGHT HAND  Final   Special Requests   Final    BOTTLES DRAWN AEROBIC ONLY Blood Culture adequate volume   Culture   Final    NO GROWTH 4 DAYS Performed at [facility], [address], [place], [place] [address]    Report Status PENDING  Incomplete     Labs:   CBC: Recent Labs  Lab 11/10/20 1800 11/11/20 0604 11/12/20 0650 11/13/20 0908 11/14/20 0451 11/15/20 0615 11/16/20 0651  WBC 15.9*   < > 15.4* 12.2* 8.4 10.0 10.8*  NEUTROABS 13.5*  --   --  9.4*  --   --   --   HGB 13.1   < > 11.7* 11.7* 11.5* 13.5 13.2  HCT 40.1   < > 36.8 36.4 35.6* 40.0 40.4  MCV 94.4   < > 95.3 95.8 93.2 90.7 91.4  PLT 185   < > 152 160 167 193 193   < > = values in this interval not  displayed.   Basic Metabolic Panel: Recent Labs  Lab 11/10/20 1800 11/11/20 0604 11/12/20 0650 11/14/20 0451 11/15/20 0615  NA 136 135 135 136 132*  K 4.1 3.8 3.8 3.0* 3.0*  CL 104 104 104 104 96*  CO2 25 23 22 23 26   GLUCOSE 183* 157* 100* 88 97  BUN 28* 23 22 11 9   CREATININE 1.13* 1.02* 1.04* 0.65 0.68  CALCIUM 9.7 8.5* 8.1* 7.6* 8.2*  MG  --  1.2*  --   --   --   PHOS  --  2.8  --   --   --    Liver Function Tests: Recent Labs  Lab 11/10/20 1800 11/11/20 0604  AST 15 15  ALT 12 11  ALKPHOS 65 52  BILITOT 0.7 0.5  PROT 6.8 5.8*  ALBUMIN 4.1 3.4*   BNP (last 3 results) Recent Labs    11/14/20 0451  BNP 513.0*   Cardiac Enzymes: No results for input(s): CKTOTAL, CKMB, CKMBINDEX, TROPONINI in the last 168 hours. CBG: Recent Labs  Lab 11/15/20 1123 11/15/20 1609 11/15/20 2026 11/16/20 0511 11/16/20 0722  GLUCAP 97 84 87 81 91   Hgb A1c No results for input(s): HGBA1C in the last 72 hours. Lipid Profile No results for input(s): CHOL, HDL, LDLCALC, TRIG, CHOLHDL, LDLDIRECT in the last 72 hours. Thyroid function studies No results for input(s): TSH, T4TOTAL, T3FREE, THYROIDAB in the last 72 hours.  Invalid input(s): FREET3 Anemia work up No results for input(s): VITAMINB12, FOLATE, FERRITIN, TIBC, IRON, RETICCTPCT in the last 72 hours. Urinalysis    Component Value Date/Time   COLORURINE YELLOW 11/10/2020 1740   APPEARANCEUR HAZY (A) 11/10/2020 1740   LABSPEC 1.016 11/10/2020 1740   PHURINE 5.0 11/10/2020 1740   GLUCOSEU NEGATIVE 11/10/2020 1740   HGBUR NEGATIVE 11/10/2020 1740   BILIRUBINUR NEGATIVE 11/10/2020 1740   KETONESUR 5 (A) 11/10/2020 1740   PROTEINUR NEGATIVE 11/10/2020 1740   UROBILINOGEN 0.2 06/22/2014 1305   NITRITE  NEGATIVE 11/10/2020 1740   LEUKOCYTESUR MODERATE (A) 11/10/2020 1740         Time coordinating discharge: Over 45 minutes  SIGNED: Deatra James, MD, FACP, [facility]. [facility],  Please use [email or web address] to  Page If 7PM-7AM, please contact night-coverage [email or web address] 11/16/2020, 10:53 AM

## 2020-11-16 NOTE — Progress Notes (Signed)
Nsg Discharge Note  Admit Date:  11/10/2020 Discharge date: 11/16/2020   CONSWELLA BRUNEY to be D/C'd Home per MD order.  AVS completed.  Copy for chart, and copy for patient signed, and dated. Patient/caregiver able to verbalize understanding. Removed IV-CDI. Reviewed d/c paperwork with patient. Answered all questions. NT Wheeled stable patient to main entrance where she was picked up by son to d/c to home. Discharge Medication: Allergies as of 11/16/2020      Reactions   Lisinopril    Other reaction(s): Other (See Comments)  Decreased kidney functions   Red Blood Cells Other (See Comments)   Patient is a Jehovah's Witness   Penicillins Rash   Sulfonamide Derivatives Rash      Medication List    STOP taking these medications   magnesium oxide 400 MG tablet Commonly known as: MAG-OX   omeprazole 20 MG capsule Commonly known as: PRILOSEC     TAKE these medications   acetaminophen 325 MG tablet Commonly known as: TYLENOL Take 2 tablets (650 mg total) by mouth every 6 (six) hours as needed for mild pain (or Fever >/= 101).   amLODipine 5 MG tablet Commonly known as: NORVASC Take 1 tablet by mouth daily.   carvedilol 6.25 MG tablet Commonly known as: COREG Take 1 tablet (6.25 mg total) by mouth 2 (two) times daily with a meal. What changed:   medication strength  how much to take   cilostazol 50 MG tablet Commonly known as: PLETAL TAKE  (1)  TABLET TWICE A DAY.   ciprofloxacin 500 MG tablet Commonly known as: CIPRO Take 1 tablet (500 mg total) by mouth 2 (two) times daily for 6 days.   insulin aspart 100 UNIT/ML injection Commonly known as: novoLOG Inject 0-15 Units into the skin 3 (three) times daily with meals.   insulin glargine 100 UNIT/ML Solostar Pen Commonly known as: LANTUS Inject 25-30 Units into the skin daily. 25u daily will increase to 30u if bs is over 200 What changed: Another medication with the same name was removed. Continue taking this  medication, and follow the directions you see here.   lactobacillus Pack Take 1 packet (1 g total) by mouth 3 (three) times daily with meals for 10 days.   methocarbamol 500 MG tablet Commonly known as: ROBAXIN Take 1 tablet (500 mg total) by mouth every 8 (eight) hours for 10 days.   metroNIDAZOLE 500 MG tablet Commonly known as: FLAGYL Take 1 tablet (500 mg total) by mouth every 8 (eight) hours for 6 days.   moxifloxacin 0.5 % ophthalmic solution Commonly known as: VIGAMOX Place into the right eye.   oxyCODONE 5 MG immediate release tablet Commonly known as: Oxy IR/ROXICODONE Take 1 tablet (5 mg total) by mouth every 6 (six) hours as needed for up to 3 days for severe pain.   pravastatin 80 MG tablet Commonly known as: PRAVACHOL Take 1 tablet (80 mg total) by mouth every morning.   vitamin C 500 MG tablet Commonly known as: ASCORBIC ACID Take 500 mg by mouth 2 (two) times daily.            Durable Medical Equipment  (From admission, onward)         Start     Ordered   11/16/20 1045  DME Walker  Once       Question Answer Comment  Walker: With 5 Inch Wheels   Patient needs a walker to treat with the following condition Balance disorder  11/16/20 1049          Discharge Assessment: Vitals:   11/15/20 2023 11/16/20 0514  BP: 139/60 130/70  Pulse: 83 83  Resp: 18 17  Temp: 98 F (36.7 C) (!) 97.4 F (36.3 C)  SpO2: 93% 92%   Skin clean, dry and intact without evidence of skin break down, no evidence of skin tears noted. IV catheter discontinued intact. Site without signs and symptoms of complications - no redness or edema noted at insertion site, patient denies c/o pain - only slight tenderness at site.  Dressing with slight pressure applied.  D/c Instructions-Education: Discharge instructions given to patient/family with verbalized understanding. D/c education completed with patient/family including follow up instructions, medication list, d/c  activities limitations if indicated, with other d/c instructions as indicated by MD - patient able to verbalize understanding, all questions fully answered. Patient instructed to return to ED, call 911, or call MD for any changes in condition.  Patient escorted via [place], and D/C home via private auto.  Santa Lighter, RN 11/16/2020 12:10 PM

## 2020-11-17 LAB — CULTURE, BLOOD (ROUTINE X 2)
Culture: NO GROWTH
Culture: NO GROWTH
Special Requests: ADEQUATE
Special Requests: ADEQUATE

## 2020-11-25 NOTE — Progress Notes (Signed)
TOC received a call that [facility] had not called to make the first home visit.  TOC called Corene Cornea, they are in need of orders. Asked if he could please call PCP to obtain orders.  Corene Cornea called back to confirm he received orders. TOC left John- Spouse a message with and update.  Corene Cornea also gave [facility] new subscriber [employer]# [phone].

## 2021-03-23 ENCOUNTER — Other Ambulatory Visit: Payer: Self-pay | Admitting: Cardiovascular Disease

## 2021-03-23 ENCOUNTER — Telehealth: Payer: Self-pay | Admitting: Cardiovascular Disease

## 2021-03-23 MED ORDER — CILOSTAZOL 50 MG PO TABS: ORAL_TABLET | ORAL | 0 refills | Status: DC

## 2021-03-23 NOTE — Telephone Encounter (Signed)
*  STAT* If patient is at the pharmacy, call can be transferred to refill team.   1. Which medications need to be refilled? (please list name of each medication and dose if known) cilostazol (PLETAL) 50 MG tablet  2. Which pharmacy/location (including street and city if local pharmacy) is medication to be sent to? [place], [place]  3. Do they need a 30 day or 90 day supply? [place]

## 2021-03-23 NOTE — Telephone Encounter (Signed)
Refills has been sent to the pharmacy. 

## 2021-04-01 ENCOUNTER — Encounter: Payer: Self-pay | Admitting: Cardiovascular Disease

## 2021-04-01 ENCOUNTER — Other Ambulatory Visit: Payer: Self-pay

## 2021-04-01 ENCOUNTER — Ambulatory Visit (INDEPENDENT_AMBULATORY_CARE_PROVIDER_SITE_OTHER): Payer: 59 | Admitting: Cardiovascular Disease

## 2021-04-01 DIAGNOSIS — I6523 Occlusion and stenosis of bilateral carotid arteries: Secondary | ICD-10-CM | POA: Diagnosis not present

## 2021-04-01 DIAGNOSIS — I739 Peripheral vascular disease, unspecified: Secondary | ICD-10-CM | POA: Diagnosis not present

## 2021-04-01 DIAGNOSIS — E7800 Pure hypercholesterolemia, unspecified: Secondary | ICD-10-CM

## 2021-04-01 DIAGNOSIS — I10 Essential (primary) hypertension: Secondary | ICD-10-CM

## 2021-04-01 NOTE — Assessment & Plan Note (Signed)
History of essential hypertension the patient measured today at 140/86.  She is on amlodipine and carvedilol.

## 2021-04-01 NOTE — Patient Instructions (Signed)
Medication Instructions:  Your physician recommends that you continue on your current medications as directed. Please refer to the Current Medication list given to you today.  *If you need a refill on your cardiac medications before your next appointment, please call your pharmacy*   Testing/Procedures: Your physician has requested that you have a carotid duplex. This test is an ultrasound of the carotid arteries in your neck. It looks at blood flow through these arteries that supply the brain with blood. Allow one hour for this exam. There are no restrictions or special instructions.  Dr. Gwenlyn Found has recommended that you have an Ultrasound of your AORTA/IVC/ILIACS.   To prepare for this test:  No food after 11PM the night before. Water is OK. (Don't drink liquids if you have been instructed not to for ANOTHER test). Avoid foods that produce bowel gas, for 24 hours prior to exam (see below). No breakfast, no chewing gum, no smoking or carbonated beverages. Patient may take morning medications with water. Come in for test at least 15 minutes early to register.   These procedures are done at [facility].  Follow-Up: At [facility], you and your health needs are our priority.  As part of our continuing mission to provide you with exceptional heart care, we have created designated Provider Care Teams.  These Care Teams include your primary Cardiologist (physician) and Advanced Practice Providers (APPs -  Physician Assistants and Nurse Practitioners) who all work together to provide you with the care you need, when you need it.  We recommend signing up for the patient portal called "MyChart".  Sign up information is provided on this After Visit Summary.  MyChart is used to connect with patients for Virtual Visits (Telemedicine).  Patients are able to view lab/test results, encounter notes, upcoming appointments, etc.  Non-urgent messages can be sent to your provider as well.   To learn  more about what you can do with MyChart, go to [email or web address].    Your next appointment:   12 month(s)  The format for your next appointment:   In Person  Provider:   Quay Burow, MD

## 2021-04-01 NOTE — Progress Notes (Signed)
04/01/2021 Yong Channel   Nov 26, 1933  [id number]  Primary Physician Sharmaine Base, [facility] Cardiologist: Lorretta Harp MD FACP, [place], [place], [place]  HPI:  Ariana Shea is a 87 y.o.  thin-appearing, divorced Caucasian female, mother of 75, grandmother to 8 grandchildren and 6 step-great-grandchildren who I last saw in the office 02/13/2020.  She has a history of hypertension, hyperlipidemia and diabetes, as well as remote tobacco abuse having quit 6 years ago and smoked 55 pack years. She does have a family history of heart disease in a brother who had a massive myocardial infarction. She has had bilateral carotid endarterectomies performed by Dr. Ruta Hinds in the past after an angiogram done November 24, 2006, revealed high-grade bilateral calcified carotid artery stenosis with a bovine arch. She has a known occluded right common iliac artery stenosis and underwent stenting of her left common iliac with an 8 x 27 mm long Express balloon expandable stent. Carotid Dopplers revealed widely patent endarterectomy sites. She has not had lower extremity Dopplers in quite some time and has complained of progressive right calf claudication which is lifestyle limiting over the last year. She denies chest pain or shortness of breath. Her most recent Dopplers performed 07/10/12 were unchanged with a right ABI 0.53, occluded right common iliac, a left ABI of 0.79 with a patent left common iliac artery stent and with left SFA unchanged from prior studies.   Since I saw her a year ago she continues to do well.  She denies chest pain or shortness of breath.  She continues to complain of right hip pain with a known occluded right common iliac artery.   Current Meds  Medication Sig   acetaminophen (TYLENOL) 325 MG tablet Take 2 tablets (650 mg total) by mouth every 6 (six) hours as needed for mild pain (or Fever >/= 101).   amLODipine (NORVASC) 5 MG tablet Take 1 tablet by mouth  daily.   insulin aspart (NOVOLOG) 100 UNIT/ML injection Inject 0-15 Units into the skin 3 (three) times daily with meals.   insulin glargine (LANTUS) 100 UNIT/ML Solostar Pen Inject 25-30 Units into the skin daily. 25u daily will increase to 30u if bs is over 200   moxifloxacin (VIGAMOX) 0.5 % ophthalmic solution Place into the right eye.   pravastatin (PRAVACHOL) 80 MG tablet Take 1 tablet (80 mg total) by mouth every morning.   vitamin C (ASCORBIC ACID) 500 MG tablet Take 500 mg by mouth 2 (two) times daily.     Allergies  Allergen Reactions   Lisinopril     Other reaction(s): Other (See Comments)  Decreased kidney functions   Red Blood Cells Other (See Comments)    Patient is a Jehovah's Witness   Penicillins Rash   Sulfonamide Derivatives Rash    Social History   Socioeconomic History   Marital status: Single    Spouse name: Not on file   Number of children: Not on file   Years of education: Not on file   Highest education level: Not on file  Occupational History   Not on file  Tobacco Use   Smoking status: Former    Packs/day: 1.00    Years: 56.00    Pack years: 56.00    Types: Cigarettes    Quit date: 11/28/2008    Years since quitting: 12.3   Smokeless tobacco: Never  Vaping Use   Vaping Use: Never used  Substance and Sexual Activity   Alcohol use:  No    Comment: quit   Drug use: No   Sexual activity: Not on file  Other Topics Concern   Not on file  Social History Narrative   Not on file   Social Determinants of Health   Financial Resource Strain: Not on file  Food Insecurity: Not on file  Transportation Needs: Not on file  Physical Activity: Not on file  Stress: Not on file  Social Connections: Not on file  Intimate Partner Violence: Not on file     Review of Systems: General: negative for chills, fever, night sweats or weight changes.  Cardiovascular: negative for chest pain, dyspnea on exertion, edema, orthopnea, palpitations, paroxysmal  nocturnal dyspnea or shortness of breath Dermatological: negative for rash Respiratory: negative for cough or wheezing Urologic: negative for hematuria Abdominal: negative for nausea, vomiting, diarrhea, bright red blood per rectum, melena, or hematemesis Neurologic: negative for visual changes, syncope, or dizziness All other systems reviewed and are otherwise negative except as noted above.    Blood pressure 140/86, pulse 76, height 5\' 3"  (1.6 m), weight 132 lb 9.6 oz (60.1 kg), SpO2 93 %.  General appearance: alert and no distress Neck: no adenopathy, no carotid bruit, no JVD, supple, symmetrical, trachea midline, and thyroid not enlarged, symmetric, no tenderness/mass/nodules Lungs: clear to auscultation bilaterally Heart: regular rate and rhythm, S1, S2 normal, no murmur, click, rub or gallop Extremities: extremities normal, atraumatic, no cyanosis or edema Pulses: 2+ and symmetric Absent right pedal pulse Skin: Skin color, texture, turgor normal. No rashes or lesions Neurologic: Grossly normal  EKG sinus rhythm at 76 with inferior Q waves and anterior Q waves as well.  I personally reviewed this EKG.  ASSESSMENT AND PLAN:   Hypertension History of essential hypertension the patient measured today at 140/86.  She is on amlodipine and carvedilol.  PVD (peripheral vascular disease) ([place]) History of peripheral arterial disease status post remote left iliac stenting by myself with a known occluded right common iliac artery and a small abdominal aortic aneurysm.  She had Doppler studies performed 12/01/2018 that showed a patent left iliac stent.  She does complain of lifestyle limiting right hip claudication.  We will repeat aortoiliac Doppler studies.  Pure hypercholesterolemia History of hyperlipidemia on pravastatin with lipid profile performed 12/19/2020 revealing total cholesterol 72, LDL 21 and HDL 44.  Carotid artery disease ([place]) History of bilateral carotid endarterectomies.   She has not had carotid Dopplers in a while.  We will recheck carotid Doppler studies.     Lorretta Harp MD FACP,FACC,FAHA, [facility] 04/01/2021 3:02 PM

## 2021-04-01 NOTE — Assessment & Plan Note (Signed)
History of hyperlipidemia on pravastatin with lipid profile performed 12/19/2020 revealing total cholesterol 72, LDL 21 and HDL 44.

## 2021-04-01 NOTE — Assessment & Plan Note (Signed)
History of peripheral arterial disease status post remote left iliac stenting by myself with a known occluded right common iliac artery and a small abdominal aortic aneurysm.  She had Doppler studies performed 12/01/2018 that showed a patent left iliac stent.  She does complain of lifestyle limiting right hip claudication.  We will repeat aortoiliac Doppler studies.

## 2021-04-01 NOTE — Assessment & Plan Note (Signed)
History of bilateral carotid endarterectomies.  She has not had carotid Dopplers in a while.  We will recheck carotid Doppler studies.

## 2021-04-20 ENCOUNTER — Other Ambulatory Visit: Payer: Self-pay

## 2021-04-20 ENCOUNTER — Ambulatory Visit (HOSPITAL_BASED_OUTPATIENT_CLINIC_OR_DEPARTMENT_OTHER)
Admission: RE | Admit: 2021-04-20 | Discharge: 2021-04-20 | Disposition: A | Discharge status: Home / Self Care | Payer: 59 | Source: Ambulatory Visit | Attending: Cardiovascular Disease | Admitting: Cardiovascular Disease

## 2021-04-20 ENCOUNTER — Ambulatory Visit (HOSPITAL_COMMUNITY)
Admission: RE | Admit: 2021-04-20 | Discharge: 2021-04-20 | Disposition: A | Discharge status: Home / Self Care | Payer: 59 | Source: Ambulatory Visit | Attending: Cardiovascular Disease | Admitting: Cardiovascular Disease

## 2021-04-20 DIAGNOSIS — Z95820 Peripheral vascular angioplasty status with implants and grafts: Secondary | ICD-10-CM | POA: Diagnosis not present

## 2021-04-20 DIAGNOSIS — I6523 Occlusion and stenosis of bilateral carotid arteries: Secondary | ICD-10-CM

## 2021-04-20 DIAGNOSIS — E7800 Pure hypercholesterolemia, unspecified: Secondary | ICD-10-CM | POA: Insufficient documentation

## 2021-04-20 DIAGNOSIS — I739 Peripheral vascular disease, unspecified: Secondary | ICD-10-CM | POA: Diagnosis present

## 2021-05-14 ENCOUNTER — Emergency Department (HOSPITAL_COMMUNITY): Payer: 59

## 2021-05-14 ENCOUNTER — Other Ambulatory Visit: Payer: Self-pay

## 2021-05-14 ENCOUNTER — Inpatient Hospital Stay (HOSPITAL_COMMUNITY)
Admission: EM | Admit: 2021-05-14 | Discharge: 2021-05-18 | DRG: 871 | Disposition: A | Discharge status: Skilled Nursing Facility | Payer: 59 | Attending: Internal Medicine | Admitting: Internal Medicine

## 2021-05-14 ENCOUNTER — Encounter (HOSPITAL_COMMUNITY): Payer: Self-pay

## 2021-05-14 DIAGNOSIS — R197 Diarrhea, unspecified: Secondary | ICD-10-CM

## 2021-05-14 DIAGNOSIS — N390 Urinary tract infection, site not specified: Secondary | ICD-10-CM | POA: Diagnosis present

## 2021-05-14 DIAGNOSIS — E7800 Pure hypercholesterolemia, unspecified: Secondary | ICD-10-CM | POA: Diagnosis present

## 2021-05-14 DIAGNOSIS — Z79899 Other long term (current) drug therapy: Secondary | ICD-10-CM

## 2021-05-14 DIAGNOSIS — Z794 Long term (current) use of insulin: Secondary | ICD-10-CM

## 2021-05-14 DIAGNOSIS — R001 Bradycardia, unspecified: Secondary | ICD-10-CM | POA: Diagnosis present

## 2021-05-14 DIAGNOSIS — I700 Atherosclerosis of aorta: Secondary | ICD-10-CM | POA: Diagnosis present

## 2021-05-14 DIAGNOSIS — I130 Hypertensive heart and chronic kidney disease with heart failure and stage 1 through stage 4 chronic kidney disease, or unspecified chronic kidney disease: Secondary | ICD-10-CM | POA: Diagnosis present

## 2021-05-14 DIAGNOSIS — M79602 Pain in left arm: Secondary | ICD-10-CM

## 2021-05-14 DIAGNOSIS — I493 Ventricular premature depolarization: Secondary | ICD-10-CM | POA: Diagnosis present

## 2021-05-14 DIAGNOSIS — Z8249 Family history of ischemic heart disease and other diseases of the circulatory system: Secondary | ICD-10-CM

## 2021-05-14 DIAGNOSIS — Z888 Allergy status to other drugs, medicaments and biological substances status: Secondary | ICD-10-CM

## 2021-05-14 DIAGNOSIS — I739 Peripheral vascular disease, unspecified: Secondary | ICD-10-CM | POA: Diagnosis present

## 2021-05-14 DIAGNOSIS — Z20822 Contact with and (suspected) exposure to covid-19: Secondary | ICD-10-CM | POA: Diagnosis present

## 2021-05-14 DIAGNOSIS — Z880 Allergy status to penicillin: Secondary | ICD-10-CM

## 2021-05-14 DIAGNOSIS — K219 Gastro-esophageal reflux disease without esophagitis: Secondary | ICD-10-CM | POA: Diagnosis present

## 2021-05-14 DIAGNOSIS — N179 Acute kidney failure, unspecified: Secondary | ICD-10-CM | POA: Diagnosis present

## 2021-05-14 DIAGNOSIS — E860 Dehydration: Secondary | ICD-10-CM | POA: Diagnosis present

## 2021-05-14 DIAGNOSIS — I5042 Chronic combined systolic (congestive) and diastolic (congestive) heart failure: Secondary | ICD-10-CM

## 2021-05-14 DIAGNOSIS — I714 Abdominal aortic aneurysm, without rupture, unspecified: Secondary | ICD-10-CM | POA: Diagnosis present

## 2021-05-14 DIAGNOSIS — E1151 Type 2 diabetes mellitus with diabetic peripheral angiopathy without gangrene: Secondary | ICD-10-CM | POA: Diagnosis present

## 2021-05-14 DIAGNOSIS — E1122 Type 2 diabetes mellitus with diabetic chronic kidney disease: Secondary | ICD-10-CM | POA: Diagnosis present

## 2021-05-14 DIAGNOSIS — J9601 Acute respiratory failure with hypoxia: Secondary | ICD-10-CM | POA: Diagnosis present

## 2021-05-14 DIAGNOSIS — N183 Chronic kidney disease, stage 3 unspecified: Secondary | ICD-10-CM | POA: Diagnosis present

## 2021-05-14 DIAGNOSIS — A0472 Enterocolitis due to Clostridium difficile, not specified as recurrent: Secondary | ICD-10-CM | POA: Diagnosis present

## 2021-05-14 DIAGNOSIS — B9620 Unspecified Escherichia coli [E. coli] as the cause of diseases classified elsewhere: Secondary | ICD-10-CM

## 2021-05-14 DIAGNOSIS — A4151 Sepsis due to Escherichia coli [E. coli]: Secondary | ICD-10-CM | POA: Diagnosis not present

## 2021-05-14 DIAGNOSIS — R0902 Hypoxemia: Secondary | ICD-10-CM

## 2021-05-14 DIAGNOSIS — Z87891 Personal history of nicotine dependence: Secondary | ICD-10-CM

## 2021-05-14 DIAGNOSIS — W19XXXA Unspecified fall, initial encounter: Secondary | ICD-10-CM

## 2021-05-14 DIAGNOSIS — Z85828 Personal history of other malignant neoplasm of skin: Secondary | ICD-10-CM

## 2021-05-14 DIAGNOSIS — Z882 Allergy status to sulfonamides status: Secondary | ICD-10-CM

## 2021-05-14 LAB — CBC WITH DIFFERENTIAL/PLATELET
Abs Immature Granulocytes: 0.12 10*3/uL — ABNORMAL HIGH (ref 0.00–0.07)
Basophils Absolute: 0 10*3/uL (ref 0.0–0.1)
Basophils Relative: 0 %
Eosinophils Absolute: 0 10*3/uL (ref 0.0–0.5)
Eosinophils Relative: 0 %
HCT: 38 % (ref 36.0–46.0)
Hemoglobin: 12.8 g/dL (ref 12.0–15.0)
Immature Granulocytes: 1 %
Lymphocytes Relative: 7 %
Lymphs Abs: 1 10*3/uL (ref 0.7–4.0)
MCH: 31.5 pg (ref 26.0–34.0)
MCHC: 33.7 g/dL (ref 30.0–36.0)
MCV: 93.6 fL (ref 80.0–100.0)
Monocytes Absolute: 1 10*3/uL (ref 0.1–1.0)
Monocytes Relative: 7 %
Neutro Abs: 11.6 10*3/uL — ABNORMAL HIGH (ref 1.7–7.7)
Neutrophils Relative %: 85 %
Platelets: 140 10*3/uL — ABNORMAL LOW (ref 150–400)
RBC: 4.06 MIL/uL (ref 3.87–5.11)
RDW: 12.8 % (ref 11.5–15.5)
WBC: 13.7 10*3/uL — ABNORMAL HIGH (ref 4.0–10.5)
nRBC: 0 % (ref 0.0–0.2)

## 2021-05-14 LAB — URINALYSIS, COMPLETE (UACMP) WITH MICROSCOPIC
Bilirubin Urine: NEGATIVE
Glucose, UA: NEGATIVE mg/dL
Ketones, ur: NEGATIVE mg/dL
Nitrite: POSITIVE — AB
Specific Gravity, Urine: 1.015 (ref 1.005–1.030)
pH: 5.5 (ref 5.0–8.0)

## 2021-05-14 LAB — LACTIC ACID, PLASMA: Lactic Acid, Venous: 1.4 mmol/L (ref 0.5–1.9)

## 2021-05-14 LAB — COMPREHENSIVE METABOLIC PANEL
ALT: 14 U/L (ref 0–44)
AST: 19 U/L (ref 15–41)
Albumin: 3.4 g/dL — ABNORMAL LOW (ref 3.5–5.0)
Alkaline Phosphatase: 68 U/L (ref 38–126)
Anion gap: 10 (ref 5–15)
BUN: 45 mg/dL — ABNORMAL HIGH (ref 8–23)
CO2: 24 mmol/L (ref 22–32)
Calcium: 8.5 mg/dL — ABNORMAL LOW (ref 8.9–10.3)
Chloride: 103 mmol/L (ref 98–111)
Creatinine, Ser: 1.81 mg/dL — ABNORMAL HIGH (ref 0.44–1.00)
GFR, Estimated: 27 mL/min — ABNORMAL LOW (ref >60)
Glucose, Bld: 165 mg/dL — ABNORMAL HIGH (ref 70–99)
Potassium: 3.7 mmol/L (ref 3.5–5.1)
Sodium: 137 mmol/L (ref 135–145)
Total Bilirubin: 0.9 mg/dL (ref 0.3–1.2)
Total Protein: 6.3 g/dL — ABNORMAL LOW (ref 6.5–8.1)

## 2021-05-14 LAB — GLUCOSE, CAPILLARY
Glucose-Capillary: 129 mg/dL — ABNORMAL HIGH (ref 70–99)
Glucose-Capillary: 111 mg/dL — ABNORMAL HIGH (ref 70–99)

## 2021-05-14 LAB — CK: Total CK: 188 U/L (ref 38–234)

## 2021-05-14 LAB — RESP PANEL BY RT-PCR (FLU A&B, COVID) ARPGX2
Influenza A by PCR: NEGATIVE
Influenza B by PCR: NEGATIVE
SARS Coronavirus 2 by RT PCR: NEGATIVE

## 2021-05-14 LAB — LIPASE, BLOOD: Lipase: 24 U/L (ref 11–51)

## 2021-05-14 MED ORDER — ACETAMINOPHEN 325 MG PO TABS
650.0000 mg | ORAL_TABLET | Freq: Four times a day (QID) | ORAL | Status: DC | PRN
  Administered 2021-05-14 – 2021-05-17 (×4): 650 mg via ORAL
  Filled 2021-05-14 (×4): qty 2

## 2021-05-14 MED ORDER — INSULIN ASPART 100 UNIT/ML IJ SOLN
0.0000 [IU] | Freq: Three times a day (TID) | INTRAMUSCULAR | Status: DC
Start: 2021-05-14 — End: 2021-05-18
  Administered 2021-05-14 – 2021-05-17 (×2): 1 [IU] via SUBCUTANEOUS

## 2021-05-14 MED ORDER — SODIUM CHLORIDE 0.9 % IV BOLUS
500.0000 mL | Freq: Once | INTRAVENOUS | Status: AC
  Administered 2021-05-14: 500 mL via INTRAVENOUS

## 2021-05-14 MED ORDER — SODIUM CHLORIDE 0.9 % IV BOLUS
1000.0000 mL | Freq: Once | INTRAVENOUS | Status: AC
  Administered 2021-05-14: 1000 mL via INTRAVENOUS

## 2021-05-14 MED ORDER — ONDANSETRON HCL 4 MG/2ML IJ SOLN
4.0000 mg | Freq: Four times a day (QID) | INTRAMUSCULAR | Status: DC | PRN
  Administered 2021-05-14: 4 mg via INTRAVENOUS
  Filled 2021-05-14: qty 2

## 2021-05-14 MED ORDER — POTASSIUM CHLORIDE IN NACL 20-0.9 MEQ/L-% IV SOLN: INTRAVENOUS | Status: DC

## 2021-05-14 MED ORDER — HEPARIN SODIUM (PORCINE) 5000 UNIT/ML IJ SOLN
5000.0000 [IU] | Freq: Three times a day (TID) | INTRAMUSCULAR | Status: DC
  Administered 2021-05-14 – 2021-05-18 (×12): 5000 [IU] via SUBCUTANEOUS
  Filled 2021-05-14 (×12): qty 1

## 2021-05-14 MED ORDER — ACETAMINOPHEN 650 MG RE SUPP: 650.0000 mg | Freq: Four times a day (QID) | RECTAL | Status: DC | PRN

## 2021-05-14 MED ORDER — ONDANSETRON HCL 4 MG PO TABS: 4.0000 mg | ORAL_TABLET | Freq: Four times a day (QID) | ORAL | Status: DC | PRN

## 2021-05-14 MED ORDER — PRAVASTATIN SODIUM 40 MG PO TABS
80.0000 mg | ORAL_TABLET | Freq: Every day | ORAL | Status: DC
  Administered 2021-05-14 – 2021-05-17 (×4): 80 mg via ORAL
  Filled 2021-05-14: qty 8
  Filled 2021-05-14 (×4): qty 2

## 2021-05-14 NOTE — ED Triage Notes (Addendum)
Patient reports that she fell this am and laid on floor for unknown amount of time. States between 0.5-1.5 hours. Patient initially complained of back pain on EMS arrival but states since moving to stretcher she no longer hurts. Patient states that she has had chills with n/v since Tuesday.

## 2021-05-14 NOTE — Plan of Care (Signed)
  Problem: Education: Goal: Knowledge of General Education information will improve Description: Including pain rating scale, medication(s)/side effects and non-pharmacologic comfort measures Outcome: Progressing   Problem: Education: Goal: Knowledge of General Education information will improve Description: Including pain rating scale, medication(s)/side effects and non-pharmacologic comfort measures Outcome: Progressing   Problem: Clinical Measurements: Goal: Ability to maintain clinical measurements within normal limits will improve Outcome: Progressing

## 2021-05-14 NOTE — Progress Notes (Signed)
   05/14/21 1336  Assess: MEWS Score  Temp 99.6 F (37.6 C)  BP (!) 118/91  Pulse Rate (!) 121  Resp 19  SpO2 91 %  Assess: MEWS Score  MEWS Temp 0  MEWS Systolic 0  MEWS Pulse 2  MEWS RR 0  MEWS LOC 0  MEWS Score 2  MEWS Score Color Yellow  Treat  Pain Scale 0-10  Pain Score 0   Dr Ree Kida

## 2021-05-14 NOTE — ED Provider Notes (Signed)
[facility] Provider Note   CSN: [id number] Arrival date & time: 05/14/21  1033     History Chief Complaint  Patient presents with   Ariana Shea is a 87 y.o. female with history including DM, GERD, HTN, PAD and stable AAA per CT imaging 10/2020 presenting with generalized weakness resulting in a fall prior to arrival.  She has had flu like sx for the past 2 days including chills, myalgias, dry cough, emesis x 1 2 days ago, but persistent watery, nonbloody diarrhea x 2 days, stating "everytime I urinate I pass watery stool too".  She was getting off the couch this am when she felt weak and slid off instead, landing on her buttocks.  She initially had pain in her lower back which has resolved.  She called her son who called EMS, but she was unable to get off the floor until EMS was there to assist.  She estimates she was on the floor for an hour.  She denies head injury or headache, also denies sob, cp, abdominal pain and reports she has been urinating regularly.   Suspects she has the flu despite getting a flu vaccine.  She is also covid vaccinated.   The history is provided by the patient.      Past Medical History:  Diagnosis Date   Abdominal aortic aneurysm 06/30/2014   Recommend follow-up ultrasound in 2018.   Anemia    Arthritis    HANDS   BCC (basal cell carcinoma of skin) 12/16/2015   Right Ant. Shoulder    Blood transfusion without reported diagnosis    Diabetes mellitus    Generalized and unspecified atherosclerosis    GERD (gastroesophageal reflux disease)    Hydronephrosis of right kidney 06/26/2014   Chronic UPJ stenosis   Hypertension    Hypomagnesemia 06/23/2014   And hypocalcemia, hypophosphatemia, and hypokalemia.   Nodular basal cell carcinoma (BCC) 06/27/2017   Behind Left Ear   Peripheral arterial disease (HCC)    history of known right common iliac artery occlusion status post left iliac stenting.   Pneumonia    hx   Pure  hypercholesterolemia    Superficial nodular basal cell carcinoma (BCC) 12/16/2015   Left Ant. Neck   Superficial nodular basal cell carcinoma (BCC) 06/27/2017   Behing Right Ear   Swallowing difficulty    gets crumbs caught in throat-told may have to have esophagus streched   Ulcer    UTI (lower urinary tract infection)    recent tx     Patient Active Problem List   Diagnosis Date Noted   Abdominal pain 11/11/2020   Leukocytosis 11/11/2020   Hyperglycemia due to diabetes mellitus ([place]) 11/11/2020   Acute colitis 11/10/2020   Syncope, vasovagal 10/30/2019   Acute renal failure superimposed on stage 3 chronic kidney disease ([place]) 10/30/2019   Dehydration    Generalized weakness    Hypotension due to hypovolemia    AKI (acute kidney injury) ([place]) 10/24/2019   Acute pyelonephritis 10/24/2019   Acute renal failure superimposed on stage 3b chronic kidney disease ([place]) 10/24/2019   Hyperkalemia    Skin infection, left side of neck near carotid endarterectomy scar 11/19/2015   Hyperlipidemia 12/11/2014   Abdominal aortic aneurysm 06/30/2014   Normocytic anemia 06/28/2014   Hypophosphatemia 06/26/2014   Hydronephrosis of right kidney 06/26/2014   Renal stones 06/26/2014   Hypokalemia 06/26/2014   UTI (urinary tract infection) 06/23/2014   Hypomagnesemia 06/23/2014   Hypocalcemia 06/23/2014  Metabolic acidosis 50/08/7046   Hypertension 06/22/2014   DM (diabetes mellitus), type 2 with peripheral vascular complications ([place]) 88/91/6945   ARF (acute renal failure) ([place]) 06/22/2014   PVD (peripheral vascular disease) ([place]) 06/22/2014   GERD (gastroesophageal reflux disease) 06/22/2014   Pure hypercholesterolemia 06/22/2014   Nausea & vomiting 06/22/2014   Dysphagia, unspecified(787.20) 11/23/2013   HTN (hypertension) 06/07/2013   Carotid artery disease ([place]) 11/30/2012   Peripheral arterial disease ([place])    COLONIC POLYPS 08/12/2010   DM 08/12/2010   ANEMIA, IRON DEFICIENCY  08/12/2010   Peripheral vascular disease ([place]) 08/12/2010    Past Surgical History:  Procedure Laterality Date   ABDOMINAL HYSTERECTOMY     ARTERY REPAIR Bilateral    corotid artery surgery   BLADDER SURGERY     tuck   CAROTID ENDARTERECTOMY     bilateral CEA 2008; (L) 11/2006, (R) 01/2007 with reperfusion phenomenon (seizure) post-operaitvely   CHOLECYSTECTOMY     MULTIPLE EXTRACTIONS WITH ALVEOLOPLASTY N/A 12/07/2013   Procedure: MULTIPLE EXTRACTION WITH ALVEOLOPLASTY;  Surgeon: Gae Bon, DDS;  Location: [facility];  Service: Oral Surgery;  Laterality: N/A;     OB History     Gravida  3   Para  3   Term  3   Preterm      AB      Living         SAB      IAB      Ectopic      Multiple      Live Births              Family History  Problem Relation Age of Onset   Heart disease Father     Social History   Tobacco Use   Smoking status: Former    Packs/day: 1.00    Years: 56.00    Pack years: 56.00    Types: Cigarettes    Quit date: 11/28/2008    Years since quitting: 12.4   Smokeless tobacco: Never  Vaping Use   Vaping Use: Never used  Substance Use Topics   Alcohol use: No    Comment: quit   Drug use: No    Home Medications Prior to Admission medications   Medication Sig Start Date End Date Taking? Authorizing Provider  acetaminophen (TYLENOL) 325 MG tablet Take 2 tablets (650 mg total) by mouth every 6 (six) hours as needed for mild pain (or Fever >/= 101). 10/27/19   Arrien, Jimmy Picket, MD  amLODipine (NORVASC) 5 MG tablet Take 1 tablet by mouth daily. 08/18/20   [provider]  carvedilol (COREG) 6.25 MG tablet Take 1 tablet (6.25 mg total) by mouth 2 (two) times daily with a meal. 11/16/20 12/16/20  Shahmehdi, Valeria Batman, MD  cilostazol (PLETAL) 50 MG tablet TAKE  (1)  TABLET TWICE A DAY. Patient not taking: Reported on 04/01/2021 03/23/21   Lorretta Harp, MD  insulin aspart (NOVOLOG) 100 UNIT/ML injection Inject 0-15 Units into  the skin 3 (three) times daily with meals. 11/16/20   Shahmehdi, Valeria Batman, MD  insulin glargine (LANTUS) 100 UNIT/ML Solostar Pen Inject 25-30 Units into the skin daily. 25u daily will increase to 30u if bs is over 200    [provider]  moxifloxacin (VIGAMOX) 0.5 % ophthalmic solution Place into the right eye. 11/06/20   [provider]  pravastatin (PRAVACHOL) 80 MG tablet Take 1 tablet (80 mg total) by mouth every morning. 02/25/15   Lorretta Harp,  MD  vitamin C (ASCORBIC ACID) 500 MG tablet Take 500 mg by mouth 2 (two) times daily.    [provider]    Allergies    Lisinopril, Red blood cells, Penicillins, and Sulfonamide derivatives  Review of Systems   Review of Systems  Constitutional:  Positive for chills and fatigue. Negative for fever.  HENT:  Negative for congestion and sore throat.   Eyes: Negative.   Respiratory:  Positive for cough. Negative for chest tightness and shortness of breath.   Cardiovascular:  Negative for chest pain.  Gastrointestinal:  Positive for diarrhea, nausea and vomiting. Negative for abdominal distention and abdominal pain.  Genitourinary: Negative.   Musculoskeletal:  Positive for back pain and myalgias. Negative for arthralgias, joint swelling and neck pain.  Skin: Negative.  Negative for rash and wound.  Neurological:  Negative for dizziness, weakness, light-headedness, numbness and headaches.  Psychiatric/Behavioral: Negative.    All other systems reviewed and are negative.  Physical Exam Updated Vital Signs BP 137/89   Pulse (!) 109   Temp 98.9 F (37.2 C)   Resp 18   Ht 5\' 3"  (1.6 m)   Wt 60.8 kg   SpO2 91%   BMI 23.74 kg/m   Physical Exam Vitals and nursing note reviewed.  Constitutional:      Appearance: She is well-developed.  HENT:     Head: Normocephalic and atraumatic.     Mouth/Throat:     Pharynx: Oropharynx is clear.  Eyes:     Conjunctiva/sclera: Conjunctivae normal.  Cardiovascular:      Rate and Rhythm: Normal rate and regular rhythm.     Heart sounds: Normal heart sounds.  Pulmonary:     Effort: Pulmonary effort is normal.     Breath sounds: Normal breath sounds. No wheezing.  Abdominal:     General: Bowel sounds are normal. There is no distension.     Palpations: Abdomen is soft.     Tenderness: There is no abdominal tenderness. There is no guarding.  Musculoskeletal:        General: Normal range of motion.     Cervical back: Normal range of motion. No tenderness.     Lumbar back: Bony tenderness present.     Comments: Moving upper and lower extremities without pain.   Skin:    General: Skin is warm and dry.  Neurological:     Mental Status: She is alert.    ED Results / Procedures / Treatments   Labs (all labs ordered are listed, but only abnormal results are displayed) Labs Reviewed  CBC WITH DIFFERENTIAL/PLATELET - Abnormal; Notable for the following components:      Result Value   WBC 13.7 (*)    Platelets 140 (*)    Neutro Abs 11.6 (*)    Abs Immature Granulocytes 0.12 (*)    All other components within normal limits  COMPREHENSIVE METABOLIC PANEL - Abnormal; Notable for the following components:   Glucose, Bld 165 (*)    BUN 45 (*)    Creatinine, Ser 1.81 (*)    Calcium 8.5 (*)    Total Protein 6.3 (*)    Albumin 3.4 (*)    GFR, Estimated 27 (*)    All other components within normal limits  RESP PANEL BY RT-PCR (FLU A&B, COVID) ARPGX2  URINE CULTURE  URINALYSIS, ROUTINE W REFLEX MICROSCOPIC  URINALYSIS, COMPLETE (UACMP) WITH MICROSCOPIC    EKG None  Radiology DG Chest 1 View  Result Date: 05/14/2021 CLINICAL DATA:  Status post fall this morning with low back pain and bilateral hip pain. EXAM: CHEST  1 VIEW COMPARISON:  Oct 29, 2019 FINDINGS: The heart size and mediastinal contours are stable. Both lungs are clear. The visualized skeletal structures are unremarkable. IMPRESSION: No active disease. Electronically Signed   By: Abelardo Diesel  M.D.   On: 05/14/2021 12:09   DG Lumbar Spine Complete  Result Date: 05/14/2021 CLINICAL DATA:  Status post fall this morning with back pain. EXAM: LUMBAR SPINE - COMPLETE 4+ VIEW COMPARISON:  Oct 24, 2019 FINDINGS: There is no evidence of lumbar spine fracture. Alignment is normal. Degenerative joint changes of the spine with narrowed joint space and osteophyte formation are noted. IMPRESSION: No acute fracture or dislocation identified. Degenerative joint changes of spine. Electronically Signed   By: Abelardo Diesel M.D.   On: 05/14/2021 12:08   DG Hips Bilat W or Wo Pelvis 3-4 Views  Result Date: 05/14/2021 CLINICAL DATA:  Status post fall this morning with bilateral hip pain. EXAM: DG HIP (WITH OR WITHOUT PELVIS) 3-4V BILAT COMPARISON:  March 17, 2018 FINDINGS: There is no evidence of hip fracture or dislocation. Narrow bilateral hip joint spaces are identified. Degenerative joint changes of the visualized spine are noted. IMPRESSION: No acute fracture or dislocation. Electronically Signed   By: Abelardo Diesel M.D.   On: 05/14/2021 12:07    Procedures Procedures   Medications Ordered in ED Medications  sodium chloride 0.9 % bolus 500 mL (0 mLs Intravenous Stopped 05/14/21 1243)  sodium chloride 0.9 % bolus 1,000 mL (1,000 mLs Intravenous New Bag/Given 05/14/21 1242)    ED Course  I have reviewed the triage vital signs and the nursing notes.  Pertinent labs & imaging results that were available during my care of the patient were reviewed by me and considered in my medical decision making (see chart for details).    MDM Rules/Calculators/A&P                           Patient presenting with worsening weakness, flulike symptoms including diarrhea.  No recent antibiotic use, no history of C. difficile.  Labs today indicate significant dehydration and acute kidney injury, she has a BUN of 45 and a creatinine of 1.81 which is significantly elevated from her creatinine is 0.686 months  ago.  She was given IV fluids here, she will benefit from at least an overnight observation and additional hydration hydration.  She has had incontinence of stool x1 since arriving here.  Discussed with Dr. Carles Collet who accepts patient for admission. Final Clinical Impression(s) / ED Diagnoses Final diagnoses:  Dehydration  AKI (acute kidney injury) ([employer])  Diarrhea, unspecified type    Rx / DC Orders ED Discharge Orders     None        Landis Martins 05/14/21 1308    Daleen Bo, MD 05/16/21 [phone]

## 2021-05-14 NOTE — H&P (Signed)
History and Physical  Ariana Shea MHD:[id number] DOB: 10-22-1933 DOA: 05/14/2021   PCP: [place]   Patient coming from: Home  Chief Complaint: generalized weakness, fall  HPI:  Ariana Shea is a 87 y.o. female with medical history of hypertension, diabetes mellitus type 2, peripheral vascular disease, AAA, hyperlipidemia, chronic dilated right extrarenal pelvis/obstructive uropathy, CKD stage III, and carotid stenosis status post CEA bilateral presenting with generalized weakness that began on 05/12/2021.  The patient stated that she began having some myalgias and generalized weakness with a nonproductive cough on 05/12/2022.  She has subjective fevers and chills.  She had 1 episode of nausea and vomiting on that day without any blood.  She denied any headache, sore throat, chest pain, shortness of breath, hemoptysis, abdominal pain, hematochezia, melena.  She states that her oral intake has been poor for the last few days.  In the past 24 hours, the patient states that she has had 2 loose bowel movements without any blood.  There is no melena.  She denies any hematochezia, hematuria.  She denies any new medications.  On the morning of 05/14/2021, the patient was trying to get up from the couch, but was too weak and slid down the edge of the couch onto her buttocks.  She was unable to get up.  She stated that she was on the floor for about an hour before her son was able to come check up on her.  EMS was activated.  At baseline, the patient is able to ambulate and perform her own ADLs. In the emergency department, the patient was afebrile with initial soft blood pressure of 95/80.  Oxygen saturation was 94% on room air.  BMP shows sodium 137, potassium 3.7, bicarb 24, serum creatinine 1.1.  LFTs were unremarkable.  WBC 13.7, hemoglobin 12.8, platelets 140,000.  COVID-19 PCR was negative.  Chest x-ray was negative for any acute findings.  Lumbar x-ray was negative  for fracture or dislocation.  The patient was given 1.5 L of fluid.  Assessment/Plan: AKI Baseline creatinine 0.6-0.8 -Presented with serum creatinine 1.1 -Secondary to volume depletion -Continue IV fluids  Generalized weakness -Secondary to acute kidney injury and possibly due to viral syndrome -COVID-19 PCR and influenza were negative. -Obtain UA -PT evaluation  Leukocytosis -Likely stress demargination -Obtain UA -Chest x-ray negative for infiltrates -Check lactic acid  Chronic systolic and diastolic CHF -Patient appears dry clinically -10/30/2019 echo EF 40 to 45%, grade 1 DD, mild MR  Essential hypertension -Holding amlodipine and carvedilol  Diabetes mellitus type 2 -NovoLog sliding scale -Hemoglobin A1c  Hyperlipidemia -Continue statin  AAA -11/10/2020 CT abdomen--stable fusiform AAA up to 3.9 cm       Past Medical History:  Diagnosis Date   Abdominal aortic aneurysm 06/30/2014   Recommend follow-up ultrasound in 2018.   Anemia    Arthritis    HANDS   BCC (basal cell carcinoma of skin) 12/16/2015   Right Ant. Shoulder    Blood transfusion without reported diagnosis    Diabetes mellitus    Generalized and unspecified atherosclerosis    GERD (gastroesophageal reflux disease)    Hydronephrosis of right kidney 06/26/2014   Chronic UPJ stenosis   Hypertension    Hypomagnesemia 06/23/2014   And hypocalcemia, hypophosphatemia, and hypokalemia.   Nodular basal cell carcinoma (BCC) 06/27/2017   Behind Left Ear   Peripheral arterial disease (HCC)    history of known right common iliac artery occlusion status  post left iliac stenting.   Pneumonia    hx   Pure hypercholesterolemia    Superficial nodular basal cell carcinoma (BCC) 12/16/2015   Left Ant. Neck   Superficial nodular basal cell carcinoma (BCC) 06/27/2017   Behing Right Ear   Swallowing difficulty    gets crumbs caught in throat-told may have to have esophagus streched   Ulcer    UTI (lower  urinary tract infection)    recent tx    Past Surgical History:  Procedure Laterality Date   ABDOMINAL HYSTERECTOMY     ARTERY REPAIR Bilateral    corotid artery surgery   BLADDER SURGERY     tuck   CAROTID ENDARTERECTOMY     bilateral CEA 2008; (L) 11/2006, (R) 01/2007 with reperfusion phenomenon (seizure) post-operaitvely   CHOLECYSTECTOMY     MULTIPLE EXTRACTIONS WITH ALVEOLOPLASTY N/A 12/07/2013   Procedure: MULTIPLE EXTRACTION WITH ALVEOLOPLASTY;  Surgeon: Gae Bon, DDS;  Location: [facility];  Service: Oral Surgery;  Laterality: N/A;   Social History:  reports that she quit smoking about 12 years ago. Her smoking use included cigarettes. She has a 56.00 pack-year smoking history. She has never used smokeless tobacco. She reports that she does not drink alcohol and does not use drugs.   Family History  Problem Relation Age of Onset   Heart disease Father      Allergies  Allergen Reactions   Lisinopril     Other reaction(s): Other (See Comments)  Decreased kidney functions   Red Blood Cells Other (See Comments)    Patient is a Jehovah's Witness   Penicillins Rash   Sulfonamide Derivatives Rash     Prior to Admission medications   Medication Sig Start Date End Date Taking? Authorizing Provider  acetaminophen (TYLENOL) 325 MG tablet Take 2 tablets (650 mg total) by mouth every 6 (six) hours as needed for mild pain (or Fever >/= 101). 10/27/19   Arrien, Jimmy Picket, MD  amLODipine (NORVASC) 5 MG tablet Take 1 tablet by mouth daily. 08/18/20   [provider]  carvedilol (COREG) 6.25 MG tablet Take 1 tablet (6.25 mg total) by mouth 2 (two) times daily with a meal. 11/16/20 12/16/20  Shahmehdi, Valeria Batman, MD  cilostazol (PLETAL) 50 MG tablet TAKE  (1)  TABLET TWICE A DAY. Patient not taking: Reported on 04/01/2021 03/23/21   Lorretta Harp, MD  insulin aspart (NOVOLOG) 100 UNIT/ML injection Inject 0-15 Units into the skin 3 (three) times daily with meals. 11/16/20    Shahmehdi, Valeria Batman, MD  insulin glargine (LANTUS) 100 UNIT/ML Solostar Pen Inject 25-30 Units into the skin daily. 25u daily will increase to 30u if bs is over 200    [provider]  moxifloxacin (VIGAMOX) 0.5 % ophthalmic solution Place into the right eye. 11/06/20   [provider]  pravastatin (PRAVACHOL) 80 MG tablet Take 1 tablet (80 mg total) by mouth every morning. 02/25/15   Lorretta Harp, MD  vitamin C (ASCORBIC ACID) 500 MG tablet Take 500 mg by mouth 2 (two) times daily.    [provider]    Review of Systems:  Constitutional:  No weight loss, night sweats, Fevers, chills, Head&Eyes: No headache.  No vision loss.  No eye pain or scotoma ENT:  No Difficulty swallowing,Tooth/dental problems,Sore throat,  No ear ache, post nasal drip,  Cardio-vascular:  No chest pain, Orthopnea, PND, swelling in lower extremities,  dizziness, palpitations  GI:  No  abdominal pain, loss of appetite, hematochezia,  melena, heartburn, indigestion, Resp:  No shortness of breath with exertion or at rest. No cough. No coughing up of blood .No wheezing.No chest wall deformity  Skin:  no rash or lesions.  GU:  no dysuria, change in color of urine, no urgency or frequency. No flank pain.  Musculoskeletal:  No joint pain or swelling. No decreased range of motion. No back pain.  Psych:  No change in mood or affect. Neurologic: No headache, no dysesthesia, no focal weakness, no vision loss. No syncope  Physical Exam: Vitals:   05/14/21 1130 05/14/21 1200 05/14/21 1245 05/14/21 1300  BP: 95/80 125/67  137/89  Pulse: 99 (!) 104 (!) 110 (!) 109  Resp: 18 18  18   Temp:      SpO2: 91% 90% 91% 91%  Weight:      Height:       General:  A&O x 3, NAD, nontoxic, pleasant/cooperative Head/Eye: No conjunctival hemorrhage, no icterus, [place]/AT, No nystagmus ENT:  No icterus,  No thrush, good dentition, no pharyngeal exudate Neck:  No masses, no lymphadenpathy, no bruits CV:   RRR, no rub, no gallop, no S3 Lung:  bibasilar rales. No wheeze Abdomen: soft/NT, +BS, nondistended, no peritoneal signs Ext: No cyanosis, No rashes, No petechiae, No lymphangitis, No edema Neuro: CNII-XII intact, strength 4/5 in bilateral upper and lower extremities, no dysmetria  Labs on Admission:  Basic Metabolic Panel: Recent Labs  Lab 05/14/21 1117  NA 137  K 3.7  CL 103  CO2 24  GLUCOSE 165*  BUN 45*  CREATININE 1.81*  CALCIUM 8.5*   Liver Function Tests: Recent Labs  Lab 05/14/21 1117  AST 19  ALT 14  ALKPHOS 68  BILITOT 0.9  PROT 6.3*  ALBUMIN 3.4*   No results for input(s): LIPASE, AMYLASE in the last 168 hours. No results for input(s): AMMONIA in the last 168 hours. CBC: Recent Labs  Lab 05/14/21 1117  WBC 13.7*  NEUTROABS 11.6*  HGB 12.8  HCT 38.0  MCV 93.6  PLT 140*   Coagulation Profile: No results for input(s): INR, PROTIME in the last 168 hours. Cardiac Enzymes: No results for input(s): CKTOTAL, CKMB, CKMBINDEX, TROPONINI in the last 168 hours. BNP: Invalid input(s): POCBNP CBG: No results for input(s): GLUCAP in the last 168 hours. Urine analysis:    Component Value Date/Time   COLORURINE YELLOW 11/10/2020 1740   APPEARANCEUR HAZY (A) 11/10/2020 1740   LABSPEC 1.016 11/10/2020 1740   PHURINE 5.0 11/10/2020 1740   GLUCOSEU NEGATIVE 11/10/2020 1740   HGBUR NEGATIVE 11/10/2020 1740   BILIRUBINUR NEGATIVE 11/10/2020 1740   KETONESUR 5 (A) 11/10/2020 1740   PROTEINUR NEGATIVE 11/10/2020 1740   UROBILINOGEN 0.2 06/22/2014 1305   NITRITE NEGATIVE 11/10/2020 1740   LEUKOCYTESUR MODERATE (A) 11/10/2020 1740   Sepsis Labs: @LABRCNTIP (procalcitonin:4,lacticidven:4) ) Recent Results (from the past 240 hour(s))  Resp Panel by RT-PCR (Flu A&B, Covid) Nasopharyngeal Swab     Status: None   Collection Time: 05/14/21 11:17 AM   Specimen: Nasopharyngeal Swab; Nasopharyngeal(NP) swabs in vial transport medium  Result Value Ref Range Status    SARS Coronavirus 2 by RT PCR NEGATIVE NEGATIVE Final    Comment: (NOTE) SARS-CoV-2 target nucleic acids are NOT DETECTED.  The SARS-CoV-2 RNA is generally detectable in upper respiratory specimens during the acute phase of infection. The lowest concentration of SARS-CoV-2 viral copies this assay can detect is 138 copies/mL. A negative result does not preclude SARS-Cov-2 infection and should not be used as the sole  basis for treatment or other patient management decisions. A negative result may occur with  improper specimen collection/handling, submission of specimen other than nasopharyngeal swab, presence of viral mutation(s) within the areas targeted by this assay, and inadequate number of viral copies(<138 copies/mL). A negative result must be combined with clinical observations, patient history, and epidemiological information. The expected result is Negative.  Fact Sheet for Patients:  EntrepreneurPulse.com.au  Fact Sheet for Healthcare Providers:  IncredibleEmployment.be  This test is no t yet approved or cleared by the [place] FDA and  has been authorized for detection and/or diagnosis of SARS-CoV-2 by FDA under an Emergency Use Authorization (EUA). This EUA will remain  in effect (meaning this test can be used) for the duration of the COVID-19 declaration under Section 564(b)(1) of the Act, 21 U.S.C.section 360bbb-3(b)(1), unless the authorization is terminated  or revoked sooner.       Influenza A by PCR NEGATIVE NEGATIVE Final   Influenza B by PCR NEGATIVE NEGATIVE Final    Comment: (NOTE) The Xpert Xpress SARS-CoV-2/FLU/RSV plus assay is intended as an aid in the diagnosis of influenza from Nasopharyngeal swab specimens and should not be used as a sole basis for treatment. Nasal washings and aspirates are unacceptable for Xpert Xpress SARS-CoV-2/FLU/RSV testing.  Fact Sheet for  Patients: EntrepreneurPulse.com.au  Fact Sheet for Healthcare Providers: IncredibleEmployment.be  This test is not yet approved or cleared by the [place] FDA and has been authorized for detection and/or diagnosis of SARS-CoV-2 by FDA under an Emergency Use Authorization (EUA). This EUA will remain in effect (meaning this test can be used) for the duration of the COVID-19 declaration under Section 564(b)(1) of the Act, 21 U.S.C. section 360bbb-3(b)(1), unless the authorization is terminated or revoked.  Performed at [facility], [address], [place], [place] [address]      Radiological Exams on Admission: DG Chest 1 View  Result Date: 05/14/2021 CLINICAL DATA:  Status post fall this morning with low back pain and bilateral hip pain. EXAM: CHEST  1 VIEW COMPARISON:  Oct 29, 2019 FINDINGS: The heart size and mediastinal contours are stable. Both lungs are clear. The visualized skeletal structures are unremarkable. IMPRESSION: No active disease. Electronically Signed   By: Abelardo Diesel M.D.   On: 05/14/2021 12:09   DG Lumbar Spine Complete  Result Date: 05/14/2021 CLINICAL DATA:  Status post fall this morning with back pain. EXAM: LUMBAR SPINE - COMPLETE 4+ VIEW COMPARISON:  Oct 24, 2019 FINDINGS: There is no evidence of lumbar spine fracture. Alignment is normal. Degenerative joint changes of the spine with narrowed joint space and osteophyte formation are noted. IMPRESSION: No acute fracture or dislocation identified. Degenerative joint changes of spine. Electronically Signed   By: Abelardo Diesel M.D.   On: 05/14/2021 12:08   DG Hips Bilat W or Wo Pelvis 3-4 Views  Result Date: 05/14/2021 CLINICAL DATA:  Status post fall this morning with bilateral hip pain. EXAM: DG HIP (WITH OR WITHOUT PELVIS) 3-4V BILAT COMPARISON:  March 17, 2018 FINDINGS: There is no evidence of hip fracture or dislocation. Narrow bilateral hip joint spaces are  identified. Degenerative joint changes of the visualized spine are noted. IMPRESSION: No acute fracture or dislocation. Electronically Signed   By: Abelardo Diesel M.D.   On: 05/14/2021 12:07        Time spent:60 minutes Code Status:   FULL Family Communication:  No Family at bedside Disposition Plan: expect 1-2 day hospitalization Consults called: none DVT Prophylaxis: [place]  Heparin    Orson Eva, DO  [facility] Pager [phone]  If 7PM-7AM, please contact night-coverage [email or web address] Password [email or web address] 05/14/2021, 1:35 PM

## 2021-05-15 DIAGNOSIS — E860 Dehydration: Secondary | ICD-10-CM | POA: Diagnosis present

## 2021-05-15 DIAGNOSIS — Z880 Allergy status to penicillin: Secondary | ICD-10-CM | POA: Diagnosis not present

## 2021-05-15 DIAGNOSIS — I700 Atherosclerosis of aorta: Secondary | ICD-10-CM | POA: Diagnosis present

## 2021-05-15 DIAGNOSIS — R7881 Bacteremia: Secondary | ICD-10-CM

## 2021-05-15 DIAGNOSIS — A4151 Sepsis due to Escherichia coli [E. coli]: Secondary | ICD-10-CM | POA: Diagnosis present

## 2021-05-15 DIAGNOSIS — I493 Ventricular premature depolarization: Secondary | ICD-10-CM | POA: Diagnosis present

## 2021-05-15 DIAGNOSIS — Z79899 Other long term (current) drug therapy: Secondary | ICD-10-CM | POA: Diagnosis not present

## 2021-05-15 DIAGNOSIS — Z85828 Personal history of other malignant neoplasm of skin: Secondary | ICD-10-CM | POA: Diagnosis not present

## 2021-05-15 DIAGNOSIS — E7800 Pure hypercholesterolemia, unspecified: Secondary | ICD-10-CM | POA: Diagnosis present

## 2021-05-15 DIAGNOSIS — A0472 Enterocolitis due to Clostridium difficile, not specified as recurrent: Secondary | ICD-10-CM | POA: Diagnosis present

## 2021-05-15 DIAGNOSIS — N183 Chronic kidney disease, stage 3 unspecified: Secondary | ICD-10-CM | POA: Diagnosis present

## 2021-05-15 DIAGNOSIS — I5042 Chronic combined systolic (congestive) and diastolic (congestive) heart failure: Secondary | ICD-10-CM | POA: Diagnosis present

## 2021-05-15 DIAGNOSIS — R001 Bradycardia, unspecified: Secondary | ICD-10-CM | POA: Diagnosis present

## 2021-05-15 DIAGNOSIS — B9620 Unspecified Escherichia coli [E. coli] as the cause of diseases classified elsewhere: Secondary | ICD-10-CM

## 2021-05-15 DIAGNOSIS — Z882 Allergy status to sulfonamides status: Secondary | ICD-10-CM | POA: Diagnosis not present

## 2021-05-15 DIAGNOSIS — Z87891 Personal history of nicotine dependence: Secondary | ICD-10-CM | POA: Diagnosis not present

## 2021-05-15 DIAGNOSIS — N179 Acute kidney failure, unspecified: Secondary | ICD-10-CM | POA: Diagnosis present

## 2021-05-15 DIAGNOSIS — J9601 Acute respiratory failure with hypoxia: Secondary | ICD-10-CM | POA: Diagnosis present

## 2021-05-15 DIAGNOSIS — N390 Urinary tract infection, site not specified: Secondary | ICD-10-CM | POA: Diagnosis present

## 2021-05-15 DIAGNOSIS — Z794 Long term (current) use of insulin: Secondary | ICD-10-CM | POA: Diagnosis not present

## 2021-05-15 DIAGNOSIS — E1122 Type 2 diabetes mellitus with diabetic chronic kidney disease: Secondary | ICD-10-CM | POA: Diagnosis present

## 2021-05-15 DIAGNOSIS — K219 Gastro-esophageal reflux disease without esophagitis: Secondary | ICD-10-CM | POA: Diagnosis present

## 2021-05-15 DIAGNOSIS — I714 Abdominal aortic aneurysm, without rupture, unspecified: Secondary | ICD-10-CM | POA: Diagnosis present

## 2021-05-15 DIAGNOSIS — Z888 Allergy status to other drugs, medicaments and biological substances status: Secondary | ICD-10-CM | POA: Diagnosis not present

## 2021-05-15 DIAGNOSIS — I130 Hypertensive heart and chronic kidney disease with heart failure and stage 1 through stage 4 chronic kidney disease, or unspecified chronic kidney disease: Secondary | ICD-10-CM | POA: Diagnosis present

## 2021-05-15 DIAGNOSIS — Z20822 Contact with and (suspected) exposure to covid-19: Secondary | ICD-10-CM | POA: Diagnosis present

## 2021-05-15 DIAGNOSIS — R197 Diarrhea, unspecified: Secondary | ICD-10-CM | POA: Diagnosis not present

## 2021-05-15 DIAGNOSIS — E1151 Type 2 diabetes mellitus with diabetic peripheral angiopathy without gangrene: Secondary | ICD-10-CM | POA: Diagnosis present

## 2021-05-15 LAB — BLOOD CULTURE ID PANEL (REFLEXED) - BCID2

## 2021-05-15 LAB — GLUCOSE, CAPILLARY
Glucose-Capillary: 90 mg/dL (ref 70–99)
Glucose-Capillary: 110 mg/dL — ABNORMAL HIGH (ref 70–99)
Glucose-Capillary: 90 mg/dL (ref 70–99)
Glucose-Capillary: 80 mg/dL (ref 70–99)

## 2021-05-15 LAB — HEMOGLOBIN A1C
Hgb A1c MFr Bld: 6.8 % — ABNORMAL HIGH (ref 4.8–5.6)
Hgb A1c MFr Bld: 6.9 % — ABNORMAL HIGH (ref 4.8–5.6)
Mean Plasma Glucose: 148 mg/dL
Mean Plasma Glucose: 151 mg/dL

## 2021-05-15 LAB — CBC
HCT: 34.3 % — ABNORMAL LOW (ref 36.0–46.0)
Hemoglobin: 11.1 g/dL — ABNORMAL LOW (ref 12.0–15.0)
MCH: 30.4 pg (ref 26.0–34.0)
MCHC: 32.4 g/dL (ref 30.0–36.0)
MCV: 94 fL (ref 80.0–100.0)
Platelets: 128 10*3/uL — ABNORMAL LOW (ref 150–400)
RBC: 3.65 MIL/uL — ABNORMAL LOW (ref 3.87–5.11)
RDW: 13 % (ref 11.5–15.5)
WBC: 10.3 10*3/uL (ref 4.0–10.5)
nRBC: 0 % (ref 0.0–0.2)

## 2021-05-15 LAB — BASIC METABOLIC PANEL
Anion gap: 11 (ref 5–15)
BUN: 36 mg/dL — ABNORMAL HIGH (ref 8–23)
CO2: 19 mmol/L — ABNORMAL LOW (ref 22–32)
Calcium: 7.5 mg/dL — ABNORMAL LOW (ref 8.9–10.3)
Chloride: 107 mmol/L (ref 98–111)
Creatinine, Ser: 1.16 mg/dL — ABNORMAL HIGH (ref 0.44–1.00)
GFR, Estimated: 46 mL/min — ABNORMAL LOW (ref >60)
Glucose, Bld: 102 mg/dL — ABNORMAL HIGH (ref 70–99)
Potassium: 3.5 mmol/L (ref 3.5–5.1)
Sodium: 137 mmol/L (ref 135–145)

## 2021-05-15 LAB — MAGNESIUM: Magnesium: 1.4 mg/dL — ABNORMAL LOW (ref 1.7–2.4)

## 2021-05-15 MED ORDER — SODIUM CHLORIDE 0.9 % IV SOLN
2.0000 g | INTRAVENOUS | Status: DC
  Administered 2021-05-15 – 2021-05-17 (×3): 2 g via INTRAVENOUS
  Filled 2021-05-15 (×3): qty 20

## 2021-05-15 NOTE — TOC Initial Note (Signed)
Transition of Care [facility]) - Initial/Assessment Note    Patient Details  Name: Ariana Shea MRN: [id number] Date of Birth: 1933-10-06  Transition of Care [facility]) CM/SW Contact:    Boneta Lucks, RN Phone Number: 05/15/2021, 2:48 PM  Clinical Narrative:      Patient admitted with acute kidney injury. PT is recommending SNF. Patient is agreeable with MD. Patient would not give preferences. She wants to talk to her son and friend that assist with her care. TOC attempted to reach her son, left a message.  TOC sent out to multiple SNF's and started Insurance auth with [employer]. Clinicals faxed.  TOC to follow and review bed offer with Son.      Expected Discharge Plan: Skilled Nursing Facility Barriers to Discharge: Continued Medical Work up  Patient Goals and CMS Choice Patient states their goals for this hospitalization and ongoing recovery are:: agreeable to SNF CMS Medicare.gov Compare Post Acute Care list provided to:: Patient Choice offered to / list presented to : Patient  Expected Discharge Plan and Services Expected Discharge Plan: [place]      Living arrangements for the past 2 months: Apartment                    Prior Living Arrangements/Services Living arrangements for the past 2 months: Apartment Lives with:: Self Patient language and need for interpreter reviewed:: No        Need for Family Participation in Patient Care: Yes (Comment) Care giver support system in place?: Yes (comment) Current home services: DME Criminal Activity/Legal Involvement Pertinent to Current Situation/Hospitalization: No - Comment as needed  Activities of Daily Living Home Assistive Devices/Equipment: Gilford Rile (specify type) ADL Screening (condition at time of admission) Patient's cognitive ability adequate to safely complete daily activities?: Yes Is the patient deaf or have difficulty hearing?: No Does the patient have difficulty seeing, even when wearing glasses/contacts?:  No Does the patient have difficulty concentrating, remembering, or making decisions?: No Patient able to express need for assistance with ADLs?: Yes Does the patient have difficulty dressing or bathing?: No Independently performs ADLs?: Yes (appropriate for developmental age) Does the patient have difficulty walking or climbing stairs?: No Weakness of Legs: None Weakness of Arms/Hands: None  Permission Sought/Granted      Share Information with NAME: Jenny Reichmann     Permission granted to share info w Relationship: Son     Emotional Assessment     Affect (typically observed): Accepting Orientation: : Oriented to Self, Oriented to Place, Oriented to Situation Alcohol / Substance Use: Not Applicable Psych Involvement: No (comment)  Admission diagnosis:  Dehydration [E86.0] Fall [W19.XXXA] AKI (acute kidney injury) ([place]) [N17.9] Diarrhea, unspecified type [R19.7] Escherichia coli sepsis ([place]) [A41.51] Patient Active Problem List   Diagnosis Date Noted   Escherichia coli sepsis ([place]) 05/15/2021   E coli bacteremia 57/84/6962   Systolic and diastolic CHF, chronic ([place]) 05/14/2021   Abdominal pain 11/11/2020   Leukocytosis 11/11/2020   Hyperglycemia due to diabetes mellitus ([place]) 11/11/2020   Acute colitis 11/10/2020   Syncope, vasovagal 10/30/2019   Acute renal failure superimposed on stage 3 chronic kidney disease ([place]) 10/30/2019   Dehydration    Generalized weakness    Hypotension due to hypovolemia    AKI (acute kidney injury) ([place]) 10/24/2019   Acute pyelonephritis 10/24/2019   Acute renal failure superimposed on stage 3b chronic kidney disease ([place]) 10/24/2019   Hyperkalemia    Skin infection, left side of neck near carotid endarterectomy scar  11/19/2015   Hyperlipidemia 12/11/2014   Abdominal aortic aneurysm 06/30/2014   Normocytic anemia 06/28/2014   Hypophosphatemia 06/26/2014   Hydronephrosis of right kidney 06/26/2014   Renal stones 06/26/2014   Hypokalemia  06/26/2014   UTI (urinary tract infection) 06/23/2014   Hypomagnesemia 06/23/2014   Hypocalcemia 56/25/6389   Metabolic acidosis 37/34/2876   Hypertension 06/22/2014   DM (diabetes mellitus), type 2 with peripheral vascular complications ([place]) 81/15/7262   ARF (acute renal failure) ([place]) 06/22/2014   PVD (peripheral vascular disease) ([place]) 06/22/2014   GERD (gastroesophageal reflux disease) 06/22/2014   Pure hypercholesterolemia 06/22/2014   Nausea & vomiting 06/22/2014   Dysphagia, unspecified(787.20) 11/23/2013   HTN (hypertension) 06/07/2013   Carotid artery disease ([place]) 11/30/2012   Peripheral arterial disease ([place])    COLONIC POLYPS 08/12/2010   DM 08/12/2010   ANEMIA, IRON DEFICIENCY 08/12/2010   Peripheral vascular disease ([place]) 08/12/2010   PCP:  [place] [place]:   [place] [place] [address] Phone: [phone] Fax: [phone]

## 2021-05-15 NOTE — Progress Notes (Signed)
PHARMACY - PHYSICIAN COMMUNICATION CRITICAL VALUE ALERT - BLOOD CULTURE IDENTIFICATION (BCID)  Ariana Shea is an 87 y.o. female who presented to [facility] on 05/14/2021 with a chief complaint of generalized weakness  Assessment:  BCID 3/4 bottles enterococcus specifically e coli, no resistance detected(include suspected source if known)  Name of physician (or Provider) Contacted: Dr Tat   Current antibiotics: Ceftriaxone 2gm iv q24h   Changes to prescribed antibiotics recommended:  Patient is on recommended antibiotics - No changes needed  No results found for this or any previous visit.  Donna Christen Aiysha Jillson 05/15/2021  9:53 AM

## 2021-05-15 NOTE — NC FL2 (Signed)
[place] MEDICAID FL2 LEVEL OF CARE SCREENING TOOL     IDENTIFICATION  Patient Name: Ariana Shea Birthdate: February 10, 1934 Sex: female Admission Date (Current Location): 05/14/2021  [facility] and [place] Number:  [employer] and Address:  [facility] [address], [place]      Provider Number: [id number]  Attending Physician Name and Address:  Orson Eva, MD  Relative Name and Phone Number:  Maleny Candy - [phone]    Current Level of Care: Hospital Recommended Level of Care: [place] Prior Approval Number:    Date Approved/Denied:   PASRR Number: [id number]  Discharge Plan: SNF    Current Diagnoses: Patient Active Problem List   Diagnosis Date Noted   Escherichia coli sepsis ([place]) 05/15/2021   E coli bacteremia 83/38/2505   Systolic and diastolic CHF, chronic ([place]) 05/14/2021   Abdominal pain 11/11/2020   Leukocytosis 11/11/2020   Hyperglycemia due to diabetes mellitus ([place]) 11/11/2020   Acute colitis 11/10/2020   Syncope, vasovagal 10/30/2019   Acute renal failure superimposed on stage 3 chronic kidney disease ([place]) 10/30/2019   Dehydration    Generalized weakness    Hypotension due to hypovolemia    AKI (acute kidney injury) ([place]) 10/24/2019   Acute pyelonephritis 10/24/2019   Acute renal failure superimposed on stage 3b chronic kidney disease ([place]) 10/24/2019   Hyperkalemia    Skin infection, left side of neck near carotid endarterectomy scar 11/19/2015   Hyperlipidemia 12/11/2014   Abdominal aortic aneurysm 06/30/2014   Normocytic anemia 06/28/2014   Hypophosphatemia 06/26/2014   Hydronephrosis of right kidney 06/26/2014   Renal stones 06/26/2014   Hypokalemia 06/26/2014   UTI (urinary tract infection) 06/23/2014   Hypomagnesemia 06/23/2014   Hypocalcemia 39/76/7341   Metabolic acidosis 93/79/0240   Hypertension 06/22/2014   DM (diabetes mellitus), type 2 with peripheral vascular complications  ([place]) 97/35/3299   ARF (acute renal failure) ([place]) 06/22/2014   PVD (peripheral vascular disease) ([place]) 06/22/2014   GERD (gastroesophageal reflux disease) 06/22/2014   Pure hypercholesterolemia 06/22/2014   Nausea & vomiting 06/22/2014   Dysphagia, unspecified(787.20) 11/23/2013   HTN (hypertension) 06/07/2013   Carotid artery disease ([place]) 11/30/2012   Peripheral arterial disease ([place])    COLONIC POLYPS 08/12/2010   DM 08/12/2010   ANEMIA, IRON DEFICIENCY 08/12/2010   Peripheral vascular disease ([place]) 08/12/2010    Orientation RESPIRATION BLADDER Height & Weight     Self, Place, Situation, Time  Normal External catheter Weight: 60.8 kg Height:  5\' 3"  (160 cm)  BEHAVIORAL SYMPTOMS/MOOD NEUROLOGICAL BOWEL NUTRITION STATUS      Continent Diet (See DC summary)  AMBULATORY STATUS COMMUNICATION OF NEEDS Skin   Extensive Assist Verbally Normal                       Personal Care Assistance Level of Assistance  Bathing, Feeding, Dressing Bathing Assistance: Maximum assistance Feeding assistance: Limited assistance Dressing Assistance: Maximum assistance     Functional Limitations Info  Sight, Hearing, Speech Sight Info: Impaired Hearing Info: Impaired Speech Info: Adequate    SPECIAL CARE FACTORS FREQUENCY  PT (By licensed PT)     PT Frequency: 5 Times a week              Contractures Contractures Info: Not present    Additional Factors Info  Code Status, Allergies Code Status Info: Full Allergies Info: Lisinopril, Red blood cells-( Jehovah's Witness) Penicillins, sulfonamide derivatives  Current Medications (05/15/2021):  This is the current hospital active medication list Current Facility-Administered Medications  Medication Dose Route Frequency Provider Last Rate Last Admin   0.9 % NaCl with KCl 20 mEq/ L  infusion   Intravenous Continuous Tat, David, MD 75 mL/hr at 05/15/21 0517 New Bag at 05/15/21 0517   acetaminophen (TYLENOL) tablet 650  mg  650 mg Oral Q6H PRN Tat, Shanon Brow, MD   650 mg at 05/14/21 1644   Or   acetaminophen (TYLENOL) suppository 650 mg  650 mg Rectal Q6H PRN Tat, Shanon Brow, MD       cefTRIAXone (ROCEPHIN) 2 g in sodium chloride 0.9 % 100 mL IVPB  2 g Intravenous Q24H Tat, Shanon Brow, MD 200 mL/hr at 05/15/21 0811 2 g at 05/15/21 0811   heparin injection 5,000 Units  5,000 Units Subcutaneous Q8H Tat, Shanon Brow, MD   5,000 Units at 05/15/21 1416   insulin aspart (novoLOG) injection 0-9 Units  0-9 Units Subcutaneous TID WC Tat, Shanon Brow, MD   1 Units at 05/14/21 1725   ondansetron (ZOFRAN) tablet 4 mg  4 mg Oral Q6H PRN Tat, Shanon Brow, MD       Or   ondansetron [facility]) injection 4 mg  4 mg Intravenous Q6H PRN Tat, David, MD   4 mg at 05/14/21 1616   pravastatin (PRAVACHOL) tablet 80 mg  80 mg Oral q1800 Orson Eva, MD   80 mg at 05/14/21 1724     Discharge Medications: Please see discharge summary for a list of discharge medications.  Relevant Imaging Results:  Relevant Lab Results:   Additional Information SS# [id number]  Boneta Lucks, RN

## 2021-05-15 NOTE — Care Management Obs Status (Signed)
[place] NOTIFICATION   Patient Details  Name: Ariana Shea MRN: [id number] Date of Birth: [birth year]   Medicare Observation Status Notification Given:  Yes    Boneta Lucks, RN 05/15/2021, 9:18 AM

## 2021-05-15 NOTE — Plan of Care (Signed)
  Problem: Acute Rehab PT Goals(only PT should resolve) Goal: Patient Will Transfer Sit To/From Stand Outcome: Progressing Flowsheets (Taken 05/15/2021 0948) Patient will transfer sit to/from stand: with supervision Goal: Pt Will Transfer Bed To Chair/Chair To Bed Outcome: Progressing Flowsheets (Taken 05/15/2021 0948) Pt will Transfer Bed to Chair/Chair to Bed: with supervision Goal: Pt Will Ambulate Outcome: Progressing Flowsheets (Taken 05/15/2021 0948) Pt will Ambulate:  25 feet  with rolling walker  with min guard assist Goal: Pt/caregiver will Perform Home Exercise Program Outcome: Progressing Flowsheets (Taken 05/15/2021 0948) Pt/caregiver will Perform Home Exercise Program:  For increased strengthening  For improved balance  Independently  9:49 AM, 05/15/21 Mearl Latin PT, DPT Physical Therapist at [facility]

## 2021-05-15 NOTE — Evaluation (Signed)
Physical Therapy Evaluation Patient Details Name: Ariana Shea MRN: [id number] DOB: 1933/09/15 Today's Date: 05/15/2021  History of Present Illness  Ariana Shea is a 87 y.o. female with medical history of hypertension, diabetes mellitus type 2, peripheral vascular disease, AAA, hyperlipidemia, chronic dilated right extrarenal pelvis/obstructive uropathy, CKD stage III, and carotid stenosis status post CEA bilateral presenting with generalized weakness that began on 05/12/2021.  The patient stated that she began having some myalgias and generalized weakness with a nonproductive cough on 05/12/2022.  She has subjective fevers and chills.  She had 1 episode of nausea and vomiting on that day without any blood.  She denied any headache, sore throat, chest pain, shortness of breath, hemoptysis, abdominal pain, hematochezia, melena.  She states that her oral intake has been poor for the last few days.  In the past 24 hours, the patient states that she has had 2 loose bowel movements without any blood.  There is no melena.  She denies any hematochezia, hematuria.  She denies any new medications.  On the morning of 05/14/2021, the patient was trying to get up from the couch, but was too weak and slid down the edge of the couch onto her buttocks.  She was unable to get up.  She stated that she was on the floor for about an hour before her son was able to come check up on her.  EMS was activated.  At baseline, the patient is able to ambulate and perform her own ADLs.   Clinical Impression  Patient limited for functional mobility as stated below secondary to BLE weakness, fatigue and fair standing balance. Patient demonstrating good sitting tolerance and balance EOB. Requires min assist/guard with all mobility today secondary to weakness and fatigue. She is able to ambulate several steps to chair with use of RW and is limited by fatigue. Patient will benefit from continued physical therapy in hospital and  recommended venue below to increase strength, balance, endurance for safe ADLs and gait.        Recommendations for follow up therapy are one component of a multi-disciplinary discharge planning process, led by the attending physician.  Recommendations may be updated based on patient status, additional functional criteria and insurance authorization.  Follow Up Recommendations Skilled nursing-short term rehab (<3 hours/day)    Assistance Recommended at Discharge Intermittent Supervision/Assistance  Functional Status Assessment Patient has had a recent decline in their functional status and demonstrates the ability to make significant improvements in function in a reasonable and predictable amount of time.  Equipment Recommendations  None recommended by PT    Recommendations for Other Services       Precautions / Restrictions Precautions Precautions: Fall Restrictions Weight Bearing Restrictions: No      Mobility  Bed Mobility Overal bed mobility: Needs Assistance Bed Mobility: Supine to Sit     Supine to sit: Min assist;HOB elevated     General bed mobility comments: assist to pull to seated EOB    Transfers Overall transfer level: Needs assistance Equipment used: Rolling walker (2 wheels) Transfers: Sit to/from Stand Sit to Stand: Min assist;Min guard                Ambulation/Gait Ambulation/Gait assistance: Min guard;Min assist Gait Distance (Feet): 4 Feet Assistive device: Rolling walker (2 wheels) Gait Pattern/deviations: Shuffle Gait velocity: decreased     General Gait Details: slow, labored cadence with RW  Science writer  Modified Rankin (Stroke Patients Only)       Balance Overall balance assessment: Needs assistance Sitting-balance support: No upper extremity supported;Feet supported Sitting balance-Leahy Scale: Good Sitting balance - Comments: seated EOB   Standing balance support: Bilateral upper  extremity supported Standing balance-Leahy Scale: Fair                               Pertinent Vitals/Pain Pain Assessment: No/denies pain    Home Living Family/patient expects to be discharged to:: Private residence Living Arrangements: Alone Available Help at Discharge: Family;Available PRN/intermittently;Neighbor Type of Home: Apartment Home Access: Elevator       Home Layout: One level Home Equipment: Rollator (4 wheels)      Prior Function Prior Level of Function : Needs assist             Mobility Comments: house hold amb with rollator ADLs Comments: family assists     Hand Dominance        Extremity/Trunk Assessment   Upper Extremity Assessment Upper Extremity Assessment: Generalized weakness    Lower Extremity Assessment Lower Extremity Assessment: Generalized weakness    Cervical / Trunk Assessment Cervical / Trunk Assessment: Normal  Communication   Communication: No difficulties  Cognition Arousal/Alertness: Awake/alert Behavior During Therapy: WFL for tasks assessed/performed Overall Cognitive Status: Within Functional Limits for tasks assessed                                          General Comments      Exercises     Assessment/Plan    PT Assessment Patient needs continued PT services  PT Problem List Decreased strength;Decreased mobility;Decreased activity tolerance;Decreased balance       PT Treatment Interventions DME instruction;Therapeutic exercise;Gait training;Balance training;Stair training;Neuromuscular re-education;Functional mobility training;Therapeutic activities;Patient/family education    PT Goals (Current goals can be found in the Care Plan section)  Acute Rehab PT Goals Patient Stated Goal: Return home PT Goal Formulation: With patient Time For Goal Achievement: 05/29/21 Potential to Achieve Goals: Fair    Frequency Min 3X/week   Barriers to discharge        Co-evaluation                AM-PAC PT "6 Clicks" Mobility  Outcome Measure Help needed turning from your back to your side while in a flat bed without using bedrails?: A Little Help needed moving from lying on your back to sitting on the side of a flat bed without using bedrails?: A Little Help needed moving to and from a bed to a chair (including a wheelchair)?: A Little Help needed standing up from a chair using your arms (e.g., wheelchair or bedside chair)?: A Little Help needed to walk in hospital room?: A Lot Help needed climbing 3-5 steps with a railing? : Total 6 Click Score: 15    End of Session   Activity Tolerance: Patient tolerated treatment well;Patient limited by fatigue Patient left: in chair;with nursing/sitter in room;with call bell/phone within reach Nurse Communication: Mobility status PT Visit Diagnosis: Unsteadiness on feet (R26.81);Other abnormalities of gait and mobility (R26.89);Muscle weakness (generalized) (M62.81)    Time: 0821-0900 PT Time Calculation (min) (ACUTE ONLY): 39 min   Charges:   PT Evaluation $PT Eval Low Complexity: 1 Low PT Treatments $Therapeutic Activity: 23-37 mins       9:47  AM, 05/15/21 Mearl Latin PT, DPT Physical Therapist at [facility]

## 2021-05-15 NOTE — Progress Notes (Signed)
Date and time results received: 05/15/21 6:56 AM  (use smartphrase ".now" to insert current time)  Test: blood cultures Critical Value: anaerobic bottle is growing gram negative rods  Name of Provider Notified: Dr. Carles Collet  Orders Received? Or Actions Taken?:  awaiting orders

## 2021-05-15 NOTE — Progress Notes (Signed)
PROGRESS NOTE  Ariana Shea UKG:[id number] DOB: 1933-09-05 DOA: 05/14/2021 PCP: [facility]  Brief History:  87 y.o. female with medical history of hypertension, diabetes mellitus type 2, peripheral vascular disease, AAA, hyperlipidemia, chronic dilated right extrarenal pelvis/obstructive uropathy, CKD stage III, and carotid stenosis status post CEA bilateral presenting with generalized weakness that began on 05/12/2021.  The patient stated that she began having some myalgias and generalized weakness with a nonproductive cough on 05/12/2022.  She has subjective fevers and chills.  She had 1 episode of nausea and vomiting on that day without any blood.  She denied any headache, sore throat, chest pain, shortness of breath, hemoptysis, abdominal pain, hematochezia, melena.  She states that her oral intake has been poor for the last few days.  In the past 24 hours, the patient states that she has had 2 loose bowel movements without any blood.  There is no melena.  She denies any hematochezia, hematuria.  She denies any new medications.  On the morning of 05/14/2021, the patient was trying to get up from the couch, but was too weak and slid down the edge of the couch onto her buttocks.  She was unable to get up.  She stated that she was on the floor for about an hour before her son was able to come check up on her.  EMS was activated.  At baseline, the patient is able to ambulate and perform her own ADLs. In the emergency department, the patient was afebrile with initial soft blood pressure of 95/80.  Oxygen saturation was 94% on room air.  BMP shows sodium 137, potassium 3.7, bicarb 24, serum creatinine 1.1.  LFTs were unremarkable.  WBC 13.7, hemoglobin 12.8, platelets 140,000.  COVID-19 PCR was negative.  Chest x-ray was negative for any acute findings.  Lumbar x-ray was negative for fracture or dislocation.  The patient was given 1.5 L of  fluid  Assessment/Plan: Sepsis -present on admission -due to UTI and bacteremia -lactic acid 1.4 -start ceftiaxone  E Coli bacteremia -start ceftriaxone pending culture data -source  = urine  E Coli UTI -UA 21-50 WBC -continue ceftriaxone  AKI Baseline creatinine 0.6-0.8 -Presented with serum creatinine 1.81 -Secondary to volume depletion -Continue IV fluids>>improving   Generalized weakness -Secondary to acute kidney injury and possibly due to viral syndrome -COVID-19 PCR and influenza were negative. -Obtain UA 21-50 WBC -PT evaluation>>SNF   Chronic systolic and diastolic CHF -Patient appears dry clinically -10/30/2019 echo EF 40 to 45%, grade 1 DD, mild MR   Essential hypertension -Holding amlodipine and carvedilol   Diabetes mellitus type 2 -NovoLog sliding scale -Hemoglobin A1c   Hyperlipidemia -Continue statin   AAA -11/10/2020 CT abdomen--stable fusiform AAA up to 3.9 cm            Status is: Inpatient  Remains inpatient appropriate because: due to severity of illness requiring IVF and IV abx        Family Communication:   no Family at bedside  Consultants:  none  Code Status:  FULL   DVT Prophylaxis:  [place] Heparin   Procedures: As Listed in Progress Note Above  Antibiotics: Ceftriaxone 11/25>>     Subjective: Patient denies fevers, chills, headache, chest pain, dyspnea, nausea, vomiting, diarrhea, abdominal pain, dysuria, hematuria, hematochezia, and melena.   Objective: Vitals:   05/14/21 1749 05/14/21 2027 05/15/21 0428 05/15/21 1408  BP: (!) 117/54 (!) 116/50 (!) 146/60 (!) 158/75  Pulse: 93 91 98 96  Resp: 18 16 16 18   Temp: 99.2 F (37.3 C) 98.2 F (36.8 C) 99.8 F (37.7 C) 100 F (37.8 C)  TempSrc: Oral Oral Oral Oral  SpO2: 90% 93% (!) 89% (!) 86%  Weight:      Height:        Intake/Output Summary (Last 24 hours) at 05/15/2021 1416 Last data filed at 05/15/2021 1300 Gross per 24 hour  Intake 2010.64 ml   Output 1200 ml  Net 810.64 ml   Weight change:  Exam:  General:  Pt is alert, follows commands appropriately, not in acute distress HEENT: No icterus, No thrush, No neck mass, [place]/AT Cardiovascular: RRR, S1/S2, no rubs, no gallops Respiratory: bibasilar rales. No wheeze Abdomen: Soft/+BS, non tender, non distended, no guarding Extremities: No edema, No lymphangitis, No petechiae, No rashes, no synovitis   Data Reviewed: I have personally reviewed following labs and imaging studies Basic Metabolic Panel: Recent Labs  Lab 05/14/21 1117 05/15/21 0507  NA 137 137  K 3.7 3.5  CL 103 107  CO2 24 19*  GLUCOSE 165* 102*  BUN 45* 36*  CREATININE 1.81* 1.16*  CALCIUM 8.5* 7.5*  MG  --  1.4*   Liver Function Tests: Recent Labs  Lab 05/14/21 1117  AST 19  ALT 14  ALKPHOS 68  BILITOT 0.9  PROT 6.3*  ALBUMIN 3.4*   Recent Labs  Lab 05/14/21 1708  LIPASE 24   No results for input(s): AMMONIA in the last 168 hours. Coagulation Profile: No results for input(s): INR, PROTIME in the last 168 hours. CBC: Recent Labs  Lab 05/14/21 1117 05/15/21 0507  WBC 13.7* 10.3  NEUTROABS 11.6*  --   HGB 12.8 11.1*  HCT 38.0 34.3*  MCV 93.6 94.0  PLT 140* 128*   Cardiac Enzymes: Recent Labs  Lab 05/14/21 1708  CKTOTAL 188   BNP: Invalid input(s): POCBNP CBG: Recent Labs  Lab 05/14/21 1612 05/14/21 2203 05/15/21 0717 05/15/21 1109  GLUCAP 129* 111* 90 110*   HbA1C: No results for input(s): HGBA1C in the last 72 hours. Urine analysis:    Component Value Date/Time   COLORURINE YELLOW 05/14/2021 1415   APPEARANCEUR CLOUDY (A) 05/14/2021 1415   LABSPEC 1.015 05/14/2021 1415   PHURINE 5.5 05/14/2021 1415   GLUCOSEU NEGATIVE 05/14/2021 1415   HGBUR SMALL (A) 05/14/2021 1415   BILIRUBINUR NEGATIVE 05/14/2021 1415   KETONESUR NEGATIVE 05/14/2021 1415   PROTEINUR TRACE (A) 05/14/2021 1415   UROBILINOGEN 0.2 06/22/2014 1305   NITRITE POSITIVE (A) 05/14/2021 1415    LEUKOCYTESUR MODERATE (A) 05/14/2021 1415   Sepsis Labs: @LABRCNTIP (procalcitonin:4,lacticidven:4) ) Recent Results (from the past 240 hour(s))  Resp Panel by RT-PCR (Flu A&B, Covid) Nasopharyngeal Swab     Status: None   Collection Time: 05/14/21 11:17 AM   Specimen: Nasopharyngeal Swab; Nasopharyngeal(NP) swabs in vial transport medium  Result Value Ref Range Status   SARS Coronavirus 2 by RT PCR NEGATIVE NEGATIVE Final    Comment: (NOTE) SARS-CoV-2 target nucleic acids are NOT DETECTED.  The SARS-CoV-2 RNA is generally detectable in upper respiratory specimens during the acute phase of infection. The lowest concentration of SARS-CoV-2 viral copies this assay can detect is 138 copies/mL. A negative result does not preclude SARS-Cov-2 infection and should not be used as the sole basis for treatment or other patient management decisions. A negative result may occur with  improper specimen collection/handling, submission of specimen other than nasopharyngeal swab, presence of viral mutation(s) within  the areas targeted by this assay, and inadequate number of viral copies(<138 copies/mL). A negative result must be combined with clinical observations, patient history, and epidemiological information. The expected result is Negative.  Fact Sheet for Patients:  EntrepreneurPulse.com.au  Fact Sheet for Healthcare Providers:  IncredibleEmployment.be  This test is no t yet approved or cleared by the [place] FDA and  has been authorized for detection and/or diagnosis of SARS-CoV-2 by FDA under an Emergency Use Authorization (EUA). This EUA will remain  in effect (meaning this test can be used) for the duration of the COVID-19 declaration under Section 564(b)(1) of the Act, 21 U.S.C.section 360bbb-3(b)(1), unless the authorization is terminated  or revoked sooner.       Influenza A by PCR NEGATIVE NEGATIVE Final   Influenza B by PCR NEGATIVE  NEGATIVE Final    Comment: (NOTE) The Xpert Xpress SARS-CoV-2/FLU/RSV plus assay is intended as an aid in the diagnosis of influenza from Nasopharyngeal swab specimens and should not be used as a sole basis for treatment. Nasal washings and aspirates are unacceptable for Xpert Xpress SARS-CoV-2/FLU/RSV testing.  Fact Sheet for Patients: EntrepreneurPulse.com.au  Fact Sheet for Healthcare Providers: IncredibleEmployment.be  This test is not yet approved or cleared by the [place] FDA and has been authorized for detection and/or diagnosis of SARS-CoV-2 by FDA under an Emergency Use Authorization (EUA). This EUA will remain in effect (meaning this test can be used) for the duration of the COVID-19 declaration under Section 564(b)(1) of the Act, 21 U.S.C. section 360bbb-3(b)(1), unless the authorization is terminated or revoked.  Performed at [facility], [address] [place], [place] [address]   Urine Culture     Status: Abnormal (Preliminary result)   Collection Time: 05/14/21  2:15 PM   Specimen: Urine, Clean Catch  Result Value Ref Range Status   Specimen Description   Final    URINE, CLEAN CATCH Performed at [facility], [address] [place], [place] [address]    Special Requests   Final    NONE Performed at [facility], [address], [place], [place] [address]    Culture (A)  Final    80,000 COLONIES/mL KLEBSIELLA PNEUMONIAE 50,000 COLONIES/mL ESCHERICHIA COLI SUSCEPTIBILITIES TO FOLLOW Performed at [facility], [place] [address], [place], [place] [address]    Report Status PENDING  Incomplete  Culture, blood (Routine X 2) w Reflex to ID Panel     Status: None (Preliminary result)   Collection Time: 05/14/21  5:08 PM   Specimen: BLOOD RIGHT ARM  Result Value Ref Range Status   Specimen Description   Final    BLOOD RIGHT ARM Performed at [facility], [address] [place], [place] [address]    Special Requests    Final    Blood Culture adequate volume BOTTLES DRAWN AEROBIC AND ANAEROBIC Performed at [facility], [address], [place], [place] [address]    Culture  Setup Time   Final    GRAM NEGATIVE RODS IN BOTH AEROBIC AND ANAEROBIC BOTTLES Gram Stain Report Called to,Read Back By and Verified With: BROWN,J@0651  BY MATTHEWS, B 11.25.22 CRITICAL RESULT CALLED TO, READ BACK BY AND VERIFIED WITH: G,COFFEE PHARMD @0952  05/15/21 EB Performed at [facility] [place] [address] [place], [place] [address]    Culture GRAM NEGATIVE RODS  Final   Report Status PENDING  Incomplete  Culture, blood (Routine X 2) w Reflex to ID Panel     Status: None (Preliminary result)  Collection Time: 05/14/21  5:08 PM   Specimen: BLOOD LEFT HAND  Result Value Ref Range Status   Specimen Description   Final    BLOOD LEFT HAND Performed at [facility], [address], [place], [place] [address]    Special Requests   Final    Blood Culture adequate volume BOTTLES DRAWN AEROBIC AND ANAEROBIC Performed at [facility], [address], [place], [place] [address]    Culture  Setup Time   Final    ANAEROBIC BOTTLE GRAM NEGATIVE RODS Gram Stain Report Called to,Read Back By and Verified With: BROWN,J@0651  BY MATTHEWS, B 11.25.22 CRITICAL VALUE NOTED.  VALUE IS CONSISTENT WITH PREVIOUSLY REPORTED AND CALLED VALUE. Performed at [facility], [place] [address] [place],  [address]    Culture GRAM NEGATIVE RODS  Final   Report Status PENDING  Incomplete  Blood Culture ID Panel (Reflexed)     Status: Abnormal   Collection Time: 05/14/21  5:08 PM  Result Value Ref Range Status   Enterococcus faecalis NOT DETECTED NOT DETECTED Final   Enterococcus Faecium NOT DETECTED NOT DETECTED Final   Listeria monocytogenes NOT DETECTED NOT DETECTED Final   Staphylococcus species NOT DETECTED NOT DETECTED Final   Staphylococcus aureus (BCID) NOT DETECTED NOT DETECTED Final   Staphylococcus epidermidis NOT DETECTED NOT  DETECTED Final   Staphylococcus lugdunensis NOT DETECTED NOT DETECTED Final   Streptococcus species NOT DETECTED NOT DETECTED Final   Streptococcus agalactiae NOT DETECTED NOT DETECTED Final   Streptococcus pneumoniae NOT DETECTED NOT DETECTED Final   Streptococcus pyogenes NOT DETECTED NOT DETECTED Final   A.calcoaceticus-baumannii NOT DETECTED NOT DETECTED Final   Bacteroides fragilis NOT DETECTED NOT DETECTED Final   Enterobacterales DETECTED (A) NOT DETECTED Final    Comment: Enterobacterales represent a large order of gram negative bacteria, not a single organism. CRITICAL RESULT CALLED TO, READ BACK BY AND VERIFIED WITH: G,COFFEE PHARMD @0952  05/15/21 EB    Enterobacter cloacae complex NOT DETECTED NOT DETECTED Final   Escherichia coli DETECTED (A) NOT DETECTED Final    Comment: CRITICAL RESULT CALLED TO, READ BACK BY AND VERIFIED WITH: G,COFFEE PHARMD @0952  05/15/21 EB    Klebsiella aerogenes NOT DETECTED NOT DETECTED Final   Klebsiella oxytoca NOT DETECTED NOT DETECTED Final   Klebsiella pneumoniae NOT DETECTED NOT DETECTED Final   Proteus species NOT DETECTED NOT DETECTED Final   Salmonella species NOT DETECTED NOT DETECTED Final   Serratia marcescens NOT DETECTED NOT DETECTED Final   Haemophilus influenzae NOT DETECTED NOT DETECTED Final   Neisseria meningitidis NOT DETECTED NOT DETECTED Final   Pseudomonas aeruginosa NOT DETECTED NOT DETECTED Final   Stenotrophomonas maltophilia NOT DETECTED NOT DETECTED Final   Candida albicans NOT DETECTED NOT DETECTED Final   Candida auris NOT DETECTED NOT DETECTED Final   Candida glabrata NOT DETECTED NOT DETECTED Final   Candida krusei NOT DETECTED NOT DETECTED Final   Candida parapsilosis NOT DETECTED NOT DETECTED Final   Candida tropicalis NOT DETECTED NOT DETECTED Final   Cryptococcus neoformans/gattii NOT DETECTED NOT DETECTED Final   CTX-M ESBL NOT DETECTED NOT DETECTED Final   Carbapenem resistance IMP NOT DETECTED NOT  DETECTED Final   Carbapenem resistance KPC NOT DETECTED NOT DETECTED Final   Carbapenem resistance NDM NOT DETECTED NOT DETECTED Final   Carbapenem resist OXA 48 LIKE NOT DETECTED NOT DETECTED Final   Carbapenem resistance VIM NOT DETECTED NOT DETECTED Final    Comment: Performed at [facility], [address], [place], [place] [address]     Scheduled Meds:  heparin  5,000 Units Subcutaneous Q8H   insulin aspart  0-9 Units Subcutaneous TID WC   pravastatin  80 mg Oral q1800   Continuous Infusions:  0.9 % NaCl with KCl 20 mEq / L 75 mL/hr at 05/15/21 0517   cefTRIAXone (ROCEPHIN)  IV 2 g (05/15/21 0811)    Procedures/Studies: DG Chest 1 View  Result Date: 05/14/2021 CLINICAL DATA:  Status post fall this morning with low back pain and bilateral hip pain. EXAM: CHEST  1 VIEW COMPARISON:  Oct 29, 2019 FINDINGS: The heart size and mediastinal contours are stable. Both lungs are clear. The visualized skeletal structures are unremarkable. IMPRESSION: No active disease. Electronically Signed   By: Abelardo Diesel M.D.   On: 05/14/2021 12:09   DG Lumbar Spine Complete  Result Date: 05/14/2021 CLINICAL DATA:  Status post fall this morning with back pain. EXAM: LUMBAR SPINE - COMPLETE 4+ VIEW COMPARISON:  Oct 24, 2019 FINDINGS: There is no evidence of lumbar spine fracture. Alignment is normal. Degenerative joint changes of the spine with narrowed joint space and osteophyte formation are noted. IMPRESSION: No acute fracture or dislocation identified. Degenerative joint changes of spine. Electronically Signed   By: Abelardo Diesel M.D.   On: 05/14/2021 12:08   VAS [place] ABI WITH/WO TBI  Result Date: 04/20/2021  LOWER EXTREMITY DOPPLER STUDY Patient Name:  ROLONDA PONTARELLI  Date of Exam:   04/20/2021 Medical Rec #: [id number]      Accession #:    [id number] Date of Birth: [birth year]       Patient Gender: F Patient Age:   51 years Exam Location:  [facility] Procedure:      VAS [place] ABI WITH/WO TBI Referring  Phys: Roderic Palau BERRY --------------------------------------------------------------------------------  Indications: Claudication, and peripheral artery disease. High Risk         Hypertension, hyperlipidemia, Diabetes, past history of Factors:          smoking. Other Factors: Patient complains of right hip pain when walking short                distances.  Vascular Interventions: History of left common iliac artery stent. Comparison Study: 11/2018-Right 0.53; Left 0.62 Performing Technologist: Wilkie Aye RVT  Examination Guidelines: A complete evaluation includes at minimum, Doppler waveform signals and systolic blood pressure reading at the level of bilateral brachial, anterior tibial, and posterior tibial arteries, when vessel segments are accessible. Bilateral testing is considered an integral part of a complete examination. Photoelectric Plethysmograph (PPG) waveforms and toe systolic pressure readings are included as required and additional duplex testing as needed. Limited examinations for reoccurring indications may be performed as noted.  ABI Findings: +---------+------------------+-----+-------------------+--------+ Right    Rt Pressure (mmHg)IndexWaveform           Comment  +---------+------------------+-----+-------------------+--------+ Brachial 156                                                +---------+------------------+-----+-------------------+--------+ PTA      79                0.51 dampened monophasic         +---------+------------------+-----+-------------------+--------+ PERO     64                     dampened  monophasic0.41     +---------+------------------+-----+-------------------+--------+ DP       59                0.38 dampened monophasic         +---------+------------------+-----+-------------------+--------+ Great Toe49                0.31 Abnormal                    +---------+------------------+-----+-------------------+--------+  +---------+------------------+-----+----------+-------+ Left     Lt Pressure (mmHg)IndexWaveform  Comment +---------+------------------+-----+----------+-------+ Brachial 127                                      +---------+------------------+-----+----------+-------+ PTA      103               0.66 monophasic        +---------+------------------+-----+----------+-------+ PERO     101                    monophasic0.65    +---------+------------------+-----+----------+-------+ DP       116               0.74 monophasic        +---------+------------------+-----+----------+-------+ Great Toe90                0.58 Abnormal          +---------+------------------+-----+----------+-------+ +-------+-----------+-----------+------------+------------+ ABI/TBIToday's ABIToday's TBIPrevious ABIPrevious TBI +-------+-----------+-----------+------------+------------+ Right  0.51       0.31       0.53        0.31         +-------+-----------+-----------+------------+------------+ Left   0.74       0.58       0.62        0.45         +-------+-----------+-----------+------------+------------+  Bilateral ABIs and TBIs appear essentially unchanged compared to prior study on 11/2018.  Summary: Right: Resting right ankle-brachial index indicates moderate right lower extremity arterial disease. The right toe-brachial index is abnormal. Left: Resting left ankle-brachial index indicates moderate left lower extremity arterial disease. The left toe-brachial index is abnormal.  *See table(s) above for measurements and observations.  Suggest follow up study in 12 months. Electronically signed by Jenkins Rouge MD on 04/20/2021 at 3:49:04 PM.    Final    DG Hips Bilat W or Wo Pelvis 3-4 Views  Result Date: 05/14/2021 CLINICAL DATA:  Status post fall this morning with bilateral hip pain. EXAM: DG HIP (WITH OR WITHOUT PELVIS) 3-4V BILAT COMPARISON:  March 17, 2018 FINDINGS: There is no  evidence of hip fracture or dislocation. Narrow bilateral hip joint spaces are identified. Degenerative joint changes of the visualized spine are noted. IMPRESSION: No acute fracture or dislocation. Electronically Signed   By: Abelardo Diesel M.D.   On: 05/14/2021 12:07   VAS US CAROTID  Result Date: 04/20/2021 Carotid Arterial Duplex Study Patient Name:  STEVEN VEAZIE  Date of Exam:   04/20/2021 Medical Rec #: [id number]      Accession #:    [id number] Date of Birth: July 08, 1933       Patient Gender: F Patient Age:   65 years Exam Location:  [facility] Procedure:      VAS US CAROTID Referring Phys: Roderic Palau BERRY --------------------------------------------------------------------------------  Indications:       Carotid artery disease and Patient denies any cerebrovascular  symptoms. Risk Factors:      Hypertension, hyperlipidemia, Diabetes, past history of                    smoking, PAD. Comparison Study:  12/2014-RICA velocity 09/38 cm/s; LICA velocity 18/29 cm/s Performing Technologist: Wilkie Aye RVT  Examination Guidelines: A complete evaluation includes B-mode imaging, spectral Doppler, color Doppler, and power Doppler as needed of all accessible portions of each vessel. Bilateral testing is considered an integral part of a complete examination. Limited examinations for reoccurring indications may be performed as noted.  Right Carotid Findings: +----------+--------+--------+--------+------------------+--------+           PSV cm/sEDV cm/sStenosisPlaque DescriptionComments +----------+--------+--------+--------+------------------+--------+ CCA Prox  52      4                                          +----------+--------+--------+--------+------------------+--------+ CCA Distal54      7                                          +----------+--------+--------+--------+------------------+--------+ ICA Prox  48      12      1-39%   heterogenous                +----------+--------+--------+--------+------------------+--------+ ICA Mid   62      16                                         +----------+--------+--------+--------+------------------+--------+ ICA Distal63      17                                         +----------+--------+--------+--------+------------------+--------+ ECA       66      7                                          +----------+--------+--------+--------+------------------+--------+ +----------+--------+-------+---------+-------------------+           PSV cm/sEDV cmsDescribe Arm Pressure (mmHG) +----------+--------+-------+---------+-------------------+ [id number]            [id number]                 +----------+--------+-------+---------+-------------------+ +---------+--------+--+--------+--+---------+ VertebralPSV cm/s63EDV cm/s15Antegrade +---------+--------+--+--------+--+---------+  Left Carotid Findings: +----------+--------+--------+--------+------------------+--------+           PSV cm/sEDV cm/sStenosisPlaque DescriptionComments +----------+--------+--------+--------+------------------+--------+ CCA Prox  70      9                                          +----------+--------+--------+--------+------------------+--------+ CCA Distal54      10                                         +----------+--------+--------+--------+------------------+--------+ ICA Prox  44      14  1-39%   heterogenous               +----------+--------+--------+--------+------------------+--------+ ICA Mid   67      21                                         +----------+--------+--------+--------+------------------+--------+ ICA Distal70      19                                         +----------+--------+--------+--------+------------------+--------+ ECA       57      4                                           +----------+--------+--------+--------+------------------+--------+ +----------+--------+--------+----------------+-------------------+           PSV cm/sEDV cm/sDescribe        Arm Pressure (mmHG) +----------+--------+--------+----------------+-------------------+ [id number]             Multiphasic, [id number]                 +----------+--------+--------+----------------+-------------------+ +---------+--------+--+--------+--+---------+ VertebralPSV cm/s53EDV cm/s16Antegrade +---------+--------+--+--------+--+---------+ Left BP is 29 mmHG lower than the left.  Summary: Right Carotid: Velocities in the right ICA are consistent with a 1-39% stenosis. Left Carotid: Velocities in the left ICA are consistent with a 1-39% stenosis. Vertebrals:  Bilateral vertebral arteries demonstrate antegrade flow. Subclavians: Right subclavian artery flow was disturbed. Normal flow              hemodynamics were seen in the left subclavian artery. *See table(s) above for measurements and observations.  Electronically signed by Jenkins Rouge MD on 04/20/2021 at 3:49:42 PM.    Final    VAS US AORTA/IVC/ILIACS  Result Date: 04/20/2021 ABDOMINAL AORTA STUDY Patient Name:  VERBIE BABIC  Date of Exam:   04/20/2021 Medical Rec #: [id number]      Accession #:    [id number] Date of Birth: [birth year]       Patient Gender: F Patient Age:   39 years Exam Location:  [facility] Procedure:      VAS US AORTA/IVC/ILIACS Referring Phys: Quay Burow --------------------------------------------------------------------------------  Indications: Follow up exam for known AAA. Left common iliac artery stent Risk Factors: Hypertension, hyperlipidemia, Diabetes, past history of smoking. Other Factors: Patient complains of right hip pain when walking short                distances. Vascular Interventions: Left common iliac artery stent. Limitations: Air/bowel gas.  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 Bilateral testing is considered an integral part of a complete examination. Limited examinations for reoccurring indications may be performed as noted.  Abdominal Aorta Findings: +-------------+-------+----------+----------+----------+--------+--------+ Location     AP (cm)Trans (cm)PSV (cm/s)Waveform  ThrombusComments +-------------+-------+----------+----------+----------+--------+--------+ Proximal     1.90   2.00      56                                   +-------------+-------+----------+----------+----------+--------+--------+  Mid          3.70   3.70      70                  Present fusiform +-------------+-------+----------+----------+----------+--------+--------+ Distal       3.10   3.30      115                 Present fusiform +-------------+-------+----------+----------+----------+--------+--------+ RT CIA Prox                   0         Occluded                   +-------------+-------+----------+----------+----------+--------+--------+ RT CIA Mid                    0         Occluded                   +-------------+-------+----------+----------+----------+--------+--------+ RT CIA Distal                 0         Occluded                   +-------------+-------+----------+----------+----------+--------+--------+ RT EIA Prox                   0         Occluded                   +-------------+-------+----------+----------+----------+--------+--------+ RT EIA Mid                    0         Occluded                   +-------------+-------+----------+----------+----------+--------+--------+ RT EIA Distal                 83        monophasic                  +-------------+-------+----------+----------+----------+--------+--------+ LT CIA Prox                   145       monophasic                 +-------------+-------+----------+----------+----------+--------+--------+ LT CIA Mid                    191       monophasic                 +-------------+-------+----------+----------+----------+--------+--------+ LT CIA Distal                 184       monophasic                 +-------------+-------+----------+----------+----------+--------+--------+ LT EIA Prox                   160       monophasic                 +-------------+-------+----------+----------+----------+--------+--------+ LT EIA Mid                    184       monophasic                 +-------------+-------+----------+----------+----------+--------+--------+  LT EIA Distal                 136       monophasic                 +-------------+-------+----------+----------+----------+--------+--------+ Unable to visualize stent struts in the left CIA Unbable to duplicate elevated velocity in the left CIA. IVC/Iliac Findings: +--------+------+   IVC   Patent +--------+------+ IVC Proxpatent +--------+------+   Summary: Abdominal Aorta: There is evidence of abnormal dilatation of the mid and distal Abdominal aorta. The largest aortic measurement is 3.7 cm. The largest aortic diameter remains essentially unchanged compared to prior exam. Previous diameter measurement was  obtained on 11/2018. Stenosis: +--------------------+-------------+ Location            Stenosis      +--------------------+-------------+ Right Common Iliac  occluded      +--------------------+-------------+ Left Common Iliac   <50% stenosis +--------------------+-------------+ Right External Iliacoccluded      +--------------------+-------------+ Left External Iliac <50% stenosis +--------------------+-------------+   *See table(s) above for measurements  and observations. Suggest follow up study in 12 months.  Electronically signed by Jenkins Rouge MD on 04/20/2021 at 3:58:57 PM.    Final     Orson Eva, DO  [facility]  If 7PM-7AM, please contact night-coverage [email or web address] Password [email or web address] 05/15/2021, 2:16 PM   LOS: 0 days

## 2021-05-16 ENCOUNTER — Inpatient Hospital Stay (HOSPITAL_COMMUNITY): Payer: 59

## 2021-05-16 DIAGNOSIS — R7881 Bacteremia: Secondary | ICD-10-CM | POA: Diagnosis not present

## 2021-05-16 DIAGNOSIS — J9601 Acute respiratory failure with hypoxia: Secondary | ICD-10-CM | POA: Diagnosis not present

## 2021-05-16 DIAGNOSIS — A4151 Sepsis due to Escherichia coli [E. coli]: Secondary | ICD-10-CM | POA: Diagnosis not present

## 2021-05-16 DIAGNOSIS — N179 Acute kidney failure, unspecified: Secondary | ICD-10-CM | POA: Diagnosis not present

## 2021-05-16 LAB — RESPIRATORY PANEL BY PCR

## 2021-05-16 LAB — CBC
HCT: 38.5 % (ref 36.0–46.0)
Hemoglobin: 12.2 g/dL (ref 12.0–15.0)
MCH: 30 pg (ref 26.0–34.0)
MCHC: 31.7 g/dL (ref 30.0–36.0)
MCV: 94.8 fL (ref 80.0–100.0)
Platelets: 120 10*3/uL — ABNORMAL LOW (ref 150–400)
RBC: 4.06 MIL/uL (ref 3.87–5.11)
RDW: 13.1 % (ref 11.5–15.5)
WBC: 8.3 10*3/uL (ref 4.0–10.5)
nRBC: 0 % (ref 0.0–0.2)

## 2021-05-16 LAB — BASIC METABOLIC PANEL
Anion gap: 8 (ref 5–15)
BUN: 29 mg/dL — ABNORMAL HIGH (ref 8–23)
CO2: 21 mmol/L — ABNORMAL LOW (ref 22–32)
Calcium: 7.8 mg/dL — ABNORMAL LOW (ref 8.9–10.3)
Chloride: 109 mmol/L (ref 98–111)
Creatinine, Ser: 1.01 mg/dL — ABNORMAL HIGH (ref 0.44–1.00)
GFR, Estimated: 55 mL/min — ABNORMAL LOW (ref >60)
Glucose, Bld: 83 mg/dL (ref 70–99)
Potassium: 3.9 mmol/L (ref 3.5–5.1)
Sodium: 138 mmol/L (ref 135–145)

## 2021-05-16 LAB — MAGNESIUM: Magnesium: 1.4 mg/dL — ABNORMAL LOW (ref 1.7–2.4)

## 2021-05-16 LAB — GLUCOSE, CAPILLARY
Glucose-Capillary: 82 mg/dL (ref 70–99)
Glucose-Capillary: 105 mg/dL — ABNORMAL HIGH (ref 70–99)
Glucose-Capillary: 97 mg/dL (ref 70–99)
Glucose-Capillary: 158 mg/dL — ABNORMAL HIGH (ref 70–99)

## 2021-05-16 LAB — C DIFFICILE QUICK SCREEN W PCR REFLEX
C Diff antigen: POSITIVE — AB
C Diff toxin: NEGATIVE

## 2021-05-16 LAB — CLOSTRIDIUM DIFFICILE BY PCR, REFLEXED: Toxigenic C. Difficile by PCR: POSITIVE — AB

## 2021-05-16 MED ORDER — PANTOPRAZOLE SODIUM 40 MG PO TBEC
40.0000 mg | DELAYED_RELEASE_TABLET | Freq: Every day | ORAL | Status: DC
  Administered 2021-05-16 – 2021-05-18 (×3): 40 mg via ORAL
  Filled 2021-05-16 (×3): qty 1

## 2021-05-16 MED ORDER — FUROSEMIDE 10 MG/ML IJ SOLN
20.0000 mg | Freq: Once | INTRAMUSCULAR | Status: AC
  Administered 2021-05-16: 20 mg via INTRAVENOUS
  Filled 2021-05-16: qty 2

## 2021-05-16 NOTE — Progress Notes (Addendum)
LATE ENTRY: Notified by RN earlier today around 1630 that pt had HR in 20s on tele. Vital signs stable and patient completely asymptomatic and unaware of any issues. Pulse was check at beside during the time patient had "low heart rate" on telemetry and was 80-90.   Personally reviewed EKG--sinus, with PACs, no high grade AV block.  Also made note of positive Cdiff assay.  Likely represents colonization as patient has No abdominal pain and WBC has been decreasing in last 48 hours with antibiotic tx of bacteremia and UTI.  Also, patient has only had 2 documented loose stools in last 48 hours.  Would hold off treatment at this time and monitor clinically. -check CBC in am  Shanon Brow Jordin Vicencio, DO

## 2021-05-16 NOTE — Progress Notes (Signed)
PROGRESS NOTE  Ariana Shea MPN:[id number] DOB: May 02, 1934 DOA: 05/14/2021 PCP: [facility]  Brief History:  87 y.o. female with medical history of hypertension, diabetes mellitus type 2, peripheral vascular disease, AAA, hyperlipidemia, chronic dilated right extrarenal pelvis/obstructive uropathy, CKD stage III, and carotid stenosis status post CEA bilateral presenting with generalized weakness that began on 05/12/2021.  The patient stated that she began having some myalgias and generalized weakness with a nonproductive cough on 05/12/2022.  She has subjective fevers and chills.  She had 1 episode of nausea and vomiting on that day without any blood.  She denied any headache, sore throat, chest pain, shortness of breath, hemoptysis, abdominal pain, hematochezia, melena.  She states that her oral intake has been poor for the last few days.  In the past 24 hours, the patient states that she has had 2 loose bowel movements without any blood.  There is no melena.  She denies any hematochezia, hematuria.  She denies any new medications.  On the morning of 05/14/2021, the patient was trying to get up from the couch, but was too weak and slid down the edge of the couch onto her buttocks.  She was unable to get up.  She stated that she was on the floor for about an hour before her son was able to come check up on her.  EMS was activated.  At baseline, the patient is able to ambulate and perform her own ADLs. In the emergency department, the patient was afebrile with initial soft blood pressure of 95/80.  Oxygen saturation was 94% on room air.  BMP shows sodium 137, potassium 3.7, bicarb 24, serum creatinine 1.1.  LFTs were unremarkable.  WBC 13.7, hemoglobin 12.8, platelets 140,000.  COVID-19 PCR was negative.  Chest x-ray was negative for any acute findings.  Lumbar x-ray was negative for fracture or dislocation.  The patient was given 1.5 L of fluid and started on IV  maintenance fluid.   Assessment/Plan: Sepsis -present on admission -due to UTI and bacteremia -lactic acid 1.4 -Continue ceftiaxone   E Coli bacteremia -start ceftriaxone pending culture data -source  = urine   E Coli UTI -UA 21-50 WBC -Klebsiella also in urine -continue ceftriaxone pending final cultures dtat  Acute respiratory failure with hypoxia -11/26--desaturated to 86% or RA yesterday -obtain CXR -saline lock IVF -suspect fluid overload -lasix IV x 1 -now stable on 2L>>98%   AKI Baseline creatinine 0.6-0.8 -Presented with serum creatinine 1.81 -Secondary to volume depletion -Continue IV fluids>>improving   Generalized weakness -Secondary to acute kidney injury and possibly due to viral syndrome -COVID-19 PCR and influenza were negative. -Obtain UA 21-50 WBC -PT evaluation>>SNF   Chronic systolic and diastolic CHF -Patient appeared dry clinically initially -10/30/2019 echo EF 40 to 45%, grade 1 DD, mild MR   Essential hypertension -Holding amlodipine and carvedilol   Diabetes mellitus type 2 -NovoLog sliding scale -Hemoglobin A1c--6.8   Hyperlipidemia -Continue statin   AAA -11/10/2020 CT abdomen--stable fusiform AAA up to 3.9 cm                 Status is: Inpatient   Remains inpatient appropriate because: due to severity of illness requiring IVF and IV abx               Family Communication:   no Family at bedside   Consultants:  none   Code Status:  FULL    DVT Prophylaxis:  [place] Heparin     Procedures: As Listed in Progress Note Above   Antibiotics: Ceftriaxone 11/25>>      Subjective: Patient denies fevers, chills, headache, chest pain, dyspnea, nausea, vomiting, diarrhea, abdominal pain, dysuria, hematuria, hematochezia, and melena. She states she still feels weak.    Objective: Vitals:   05/15/21 1408 05/15/21 2103 05/15/21 2121 05/16/21 0611  BP: (!) 158/75  121/72 (!) 141/82  Pulse: 96 82 72 90  Resp: 18 16  18 19   Temp: 100 F (37.8 C)  97.9 F (36.6 C) 98.4 F (36.9 C)  TempSrc: Oral  Oral Oral  SpO2: (!) 86% 90% 94% 94%  Weight:      Height:        Intake/Output Summary (Last 24 hours) at 05/16/2021 1332 Last data filed at 05/16/2021 1058 Gross per 24 hour  Intake 100 ml  Output 1600 ml  Net -1500 ml   Weight change:  Exam:  General:  Pt is alert, follows commands appropriately, not in acute distress HEENT: No icterus, No thrush, No neck mass, [place]/AT Cardiovascular: RRR, S1/S2, no rubs, no gallops Respiratory: bibasilar crackles. No wheeze Abdomen: Soft/+BS, non tender, non distended, no guarding Extremities: No edema, No lymphangitis, No petechiae, No rashes, no synovitis   Data Reviewed: I have personally reviewed following labs and imaging studies Basic Metabolic Panel: Recent Labs  Lab 05/14/21 1117 05/15/21 0507 05/16/21 0459  NA 137 137 138  K 3.7 3.5 3.9  CL 103 107 109  CO2 24 19* 21*  GLUCOSE 165* 102* 83  BUN 45* 36* 29*  CREATININE 1.81* 1.16* 1.01*  CALCIUM 8.5* 7.5* 7.8*  MG  --  1.4* 1.4*   Liver Function Tests: Recent Labs  Lab 05/14/21 1117  AST 19  ALT 14  ALKPHOS 68  BILITOT 0.9  PROT 6.3*  ALBUMIN 3.4*   Recent Labs  Lab 05/14/21 1708  LIPASE 24   No results for input(s): AMMONIA in the last 168 hours. Coagulation Profile: No results for input(s): INR, PROTIME in the last 168 hours. CBC: Recent Labs  Lab 05/14/21 1117 05/15/21 0507 05/16/21 0459  WBC 13.7* 10.3 8.3  NEUTROABS 11.6*  --   --   HGB 12.8 11.1* 12.2  HCT 38.0 34.3* 38.5  MCV 93.6 94.0 94.8  PLT 140* 128* 120*   Cardiac Enzymes: Recent Labs  Lab 05/14/21 1708  CKTOTAL 188   BNP: Invalid input(s): POCBNP CBG: Recent Labs  Lab 05/15/21 1109 05/15/21 1605 05/15/21 2118 05/16/21 0715 05/16/21 1115  GLUCAP 110* 90 80 82 105*   HbA1C: Recent Labs    05/14/21 1708  HGBA1C 6.9*  6.8*   Urine analysis:    Component Value Date/Time    COLORURINE YELLOW 05/14/2021 1415   APPEARANCEUR CLOUDY (A) 05/14/2021 1415   LABSPEC 1.015 05/14/2021 1415   PHURINE 5.5 05/14/2021 1415   GLUCOSEU NEGATIVE 05/14/2021 1415   HGBUR SMALL (A) 05/14/2021 1415   BILIRUBINUR NEGATIVE 05/14/2021 1415   KETONESUR NEGATIVE 05/14/2021 1415   PROTEINUR TRACE (A) 05/14/2021 1415   UROBILINOGEN 0.2 06/22/2014 1305   NITRITE POSITIVE (A) 05/14/2021 1415   LEUKOCYTESUR MODERATE (A) 05/14/2021 1415   Sepsis Labs: @LABRCNTIP (procalcitonin:4,lacticidven:4) ) Recent Results (from the past 240 hour(s))  Resp Panel by RT-PCR (Flu A&B, Covid) Nasopharyngeal Swab     Status: None   Collection Time: 05/14/21 11:17 AM   Specimen: Nasopharyngeal Swab; Nasopharyngeal(NP) swabs in vial transport medium  Result Value Ref Range Status   SARS Coronavirus  2 by RT PCR NEGATIVE NEGATIVE Final    Comment: (NOTE) SARS-CoV-2 target nucleic acids are NOT DETECTED.  The SARS-CoV-2 RNA is generally detectable in upper respiratory specimens during the acute phase of infection. The lowest concentration of SARS-CoV-2 viral copies this assay can detect is 138 copies/mL. A negative result does not preclude SARS-Cov-2 infection and should not be used as the sole basis for treatment or other patient management decisions. A negative result may occur with  improper specimen collection/handling, submission of specimen other than nasopharyngeal swab, presence of viral mutation(s) within the areas targeted by this assay, and inadequate number of viral copies(<138 copies/mL). A negative result must be combined with clinical observations, patient history, and epidemiological information. The expected result is Negative.  Fact Sheet for Patients:  EntrepreneurPulse.com.au  Fact Sheet for Healthcare Providers:  IncredibleEmployment.be  This test is no t yet approved or cleared by the [place] FDA and  has been authorized for detection  and/or diagnosis of SARS-CoV-2 by FDA under an Emergency Use Authorization (EUA). This EUA will remain  in effect (meaning this test can be used) for the duration of the COVID-19 declaration under Section 564(b)(1) of the Act, 21 U.S.C.section 360bbb-3(b)(1), unless the authorization is terminated  or revoked sooner.       Influenza A by PCR NEGATIVE NEGATIVE Final   Influenza B by PCR NEGATIVE NEGATIVE Final    Comment: (NOTE) The Xpert Xpress SARS-CoV-2/FLU/RSV plus assay is intended as an aid in the diagnosis of influenza from Nasopharyngeal swab specimens and should not be used as a sole basis for treatment. Nasal washings and aspirates are unacceptable for Xpert Xpress SARS-CoV-2/FLU/RSV testing.  Fact Sheet for Patients: EntrepreneurPulse.com.au  Fact Sheet for Healthcare Providers: IncredibleEmployment.be  This test is not yet approved or cleared by the [place] FDA and has been authorized for detection and/or diagnosis of SARS-CoV-2 by FDA under an Emergency Use Authorization (EUA). This EUA will remain in effect (meaning this test can be used) for the duration of the COVID-19 declaration under Section 564(b)(1) of the Act, 21 U.S.C. section 360bbb-3(b)(1), unless the authorization is terminated or revoked.  Performed at [facility], [address], [place], [place] [address]   Urine Culture     Status: Abnormal (Preliminary result)   Collection Time: 05/14/21  2:15 PM   Specimen: Urine, Clean Catch  Result Value Ref Range Status   Specimen Description URINE, CLEAN CATCH  Final   Special Requests NONE  Final   Culture (A)  Final    80,000 COLONIES/mL KLEBSIELLA PNEUMONIAE 50,000 COLONIES/mL ESCHERICHIA COLI SUSCEPTIBILITIES TO FOLLOW    Report Status PENDING  Incomplete   Organism ID, Bacteria KLEBSIELLA PNEUMONIAE (A)  Final      Susceptibility   Klebsiella pneumoniae - MIC*    AMPICILLIN >=32 RESISTANT Resistant      CEFAZOLIN <=4 SENSITIVE Sensitive     CEFEPIME <=0.12 SENSITIVE Sensitive     CEFTRIAXONE <=0.25 SENSITIVE Sensitive     CIPROFLOXACIN <=0.25 SENSITIVE Sensitive     GENTAMICIN <=1 SENSITIVE Sensitive     IMIPENEM <=0.25 SENSITIVE Sensitive     NITROFURANTOIN 32 SENSITIVE Sensitive     TRIMETH/SULFA <=20 SENSITIVE Sensitive     AMPICILLIN/SULBACTAM 8 SENSITIVE Sensitive     PIP/TAZO Value in next row Sensitive      <=4 SENSITIVEPerformed at [facility], [address], [place], [place] [address]    * 80,000 COLONIES/mL KLEBSIELLA PNEUMONIAE  Respiratory (~20 pathogens) panel by PCR  Status: None   Collection Time: 05/14/21  4:04 PM   Specimen: Nasopharyngeal Swab; Respiratory  Result Value Ref Range Status   Adenovirus NOT DETECTED NOT DETECTED Final   Coronavirus 229E NOT DETECTED NOT DETECTED Final    Comment: (NOTE) The Coronavirus on the Respiratory Panel, DOES NOT test for the novel  Coronavirus (2019 nCoV)    Coronavirus HKU1 NOT DETECTED NOT DETECTED Final   Coronavirus NL63 NOT DETECTED NOT DETECTED Final   Coronavirus OC43 NOT DETECTED NOT DETECTED Final   Metapneumovirus NOT DETECTED NOT DETECTED Final   Rhinovirus / Enterovirus NOT DETECTED NOT DETECTED Final   Influenza A NOT DETECTED NOT DETECTED Final   Influenza B NOT DETECTED NOT DETECTED Final   Parainfluenza Virus 1 NOT DETECTED NOT DETECTED Final   Parainfluenza Virus 2 NOT DETECTED NOT DETECTED Final   Parainfluenza Virus 3 NOT DETECTED NOT DETECTED Final   Parainfluenza Virus 4 NOT DETECTED NOT DETECTED Final   Respiratory Syncytial Virus NOT DETECTED NOT DETECTED Final   Bordetella pertussis NOT DETECTED NOT DETECTED Final   Bordetella Parapertussis NOT DETECTED NOT DETECTED Final   Chlamydophila pneumoniae NOT DETECTED NOT DETECTED Final   Mycoplasma pneumoniae NOT DETECTED NOT DETECTED Final    Comment: Performed at [facility], [place]. [address] [place], [place] [address]  Culture, blood  (Routine X 2) w Reflex to ID Panel     Status: Abnormal (Preliminary result)   Collection Time: 05/14/21  5:08 PM   Specimen: BLOOD RIGHT ARM  Result Value Ref Range Status   Specimen Description   Final    BLOOD RIGHT ARM Performed at [facility], [address] [place], [place] [address]    Special Requests   Final    Blood Culture adequate volume BOTTLES DRAWN AEROBIC AND ANAEROBIC Performed at [place] [place], [place] [address]    Culture  Setup Time   Final    GRAM NEGATIVE RODS IN BOTH AEROBIC AND ANAEROBIC BOTTLES Gram Stain Report Called to,Read Back By and Verified With: BROWN,J@0651  BY MATTHEWS, B 11.25.22 CRITICAL RESULT CALLED TO, READ BACK BY AND VERIFIED WITH: G,COFFEE PHARMD @0952  05/15/21 EB    Culture (A)  Final    ESCHERICHIA COLI SUSCEPTIBILITIES TO FOLLOW Performed at [facility], [address], [place], [place] [address]    Report Status PENDING  Incomplete  Culture, blood (Routine X 2) w Reflex to ID Panel     Status: None (Preliminary result)   Collection Time: 05/14/21  5:08 PM   Specimen: BLOOD LEFT HAND  Result Value Ref Range Status   Specimen Description   Final    BLOOD LEFT HAND Performed at [facility], [address] [place], [place] [address]    Special Requests   Final    Blood Culture adequate volume BOTTLES DRAWN AEROBIC AND ANAEROBIC Performed at [facility], [address], [place], [place] [address]    Culture  Setup Time   Final    GRAM NEGATIVE RODS IN BOTH AEROBIC AND ANAEROBIC BOTTLES Gram Stain Report Called to,Read Back By and Verified With: BROWN,J@0651  BY MATTHEWS, B 11.25.22 CRITICAL VALUE NOTED.  VALUE IS CONSISTENT WITH PREVIOUSLY REPORTED AND CALLED VALUE.    Culture   Final    GRAM NEGATIVE RODS IDENTIFICATION TO FOLLOW Performed at [facility], [place] [address], [place], [place] [address]    Report Status PENDING  Incomplete  Blood Culture ID Panel (Reflexed)  Status: Abnormal    Collection Time: 05/14/21  5:08 PM  Result Value Ref Range Status   Enterococcus faecalis NOT DETECTED NOT DETECTED Final   Enterococcus Faecium NOT DETECTED NOT DETECTED Final   Listeria monocytogenes NOT DETECTED NOT DETECTED Final   Staphylococcus species NOT DETECTED NOT DETECTED Final   Staphylococcus aureus (BCID) NOT DETECTED NOT DETECTED Final   Staphylococcus epidermidis NOT DETECTED NOT DETECTED Final   Staphylococcus lugdunensis NOT DETECTED NOT DETECTED Final   Streptococcus species NOT DETECTED NOT DETECTED Final   Streptococcus agalactiae NOT DETECTED NOT DETECTED Final   Streptococcus pneumoniae NOT DETECTED NOT DETECTED Final   Streptococcus pyogenes NOT DETECTED NOT DETECTED Final   A.calcoaceticus-baumannii NOT DETECTED NOT DETECTED Final   Bacteroides fragilis NOT DETECTED NOT DETECTED Final   Enterobacterales DETECTED (A) NOT DETECTED Final    Comment: Enterobacterales represent a large order of gram negative bacteria, not a single organism. CRITICAL RESULT CALLED TO, READ BACK BY AND VERIFIED WITH: G,COFFEE PHARMD @0952  05/15/21 EB    Enterobacter cloacae complex NOT DETECTED NOT DETECTED Final   Escherichia coli DETECTED (A) NOT DETECTED Final    Comment: CRITICAL RESULT CALLED TO, READ BACK BY AND VERIFIED WITH: G,COFFEE PHARMD @0952  05/15/21 EB    Klebsiella aerogenes NOT DETECTED NOT DETECTED Final   Klebsiella oxytoca NOT DETECTED NOT DETECTED Final   Klebsiella pneumoniae NOT DETECTED NOT DETECTED Final   Proteus species NOT DETECTED NOT DETECTED Final   Salmonella species NOT DETECTED NOT DETECTED Final   Serratia marcescens NOT DETECTED NOT DETECTED Final   Haemophilus influenzae NOT DETECTED NOT DETECTED Final   Neisseria meningitidis NOT DETECTED NOT DETECTED Final   Pseudomonas aeruginosa NOT DETECTED NOT DETECTED Final   Stenotrophomonas maltophilia NOT DETECTED NOT DETECTED Final   Candida albicans NOT DETECTED NOT DETECTED Final   Candida auris  NOT DETECTED NOT DETECTED Final   Candida glabrata NOT DETECTED NOT DETECTED Final   Candida krusei NOT DETECTED NOT DETECTED Final   Candida parapsilosis NOT DETECTED NOT DETECTED Final   Candida tropicalis NOT DETECTED NOT DETECTED Final   Cryptococcus neoformans/gattii NOT DETECTED NOT DETECTED Final   CTX-M ESBL NOT DETECTED NOT DETECTED Final   Carbapenem resistance IMP NOT DETECTED NOT DETECTED Final   Carbapenem resistance KPC NOT DETECTED NOT DETECTED Final   Carbapenem resistance NDM NOT DETECTED NOT DETECTED Final   Carbapenem resist OXA 48 LIKE NOT DETECTED NOT DETECTED Final   Carbapenem resistance VIM NOT DETECTED NOT DETECTED Final    Comment: Performed at [facility], [address] [place], [place] [address]  C Difficile Quick Screen w PCR reflex     Status: Abnormal   Collection Time: 05/15/21 11:08 AM   Specimen: STOOL  Result Value Ref Range Status   C Diff antigen POSITIVE (A) NEGATIVE Final   C Diff toxin NEGATIVE NEGATIVE Final   C Diff interpretation Results are indeterminate. See PCR results.  Final    Comment: Performed at [facility], [address], [place], [place] [address]     Scheduled Meds:  heparin  5,000 Units Subcutaneous Q8H   insulin aspart  0-9 Units Subcutaneous TID WC   pantoprazole  40 mg Oral Daily   pravastatin  80 mg Oral q1800   Continuous Infusions:  cefTRIAXone (ROCEPHIN)  IV 2 g (05/16/21 1008)    Procedures/Studies: DG Chest 1 View  Result Date: 05/14/2021 CLINICAL DATA:  Status post fall this morning with low back pain and bilateral  hip pain. EXAM: CHEST  1 VIEW COMPARISON:  Oct 29, 2019 FINDINGS: The heart size and mediastinal contours are stable. Both lungs are clear. The visualized skeletal structures are unremarkable. IMPRESSION: No active disease. Electronically Signed   By: Abelardo Diesel M.D.   On: 05/14/2021 12:09   DG Lumbar Spine Complete  Result Date: 05/14/2021 CLINICAL DATA:  Status post fall this morning  with back pain. EXAM: LUMBAR SPINE - COMPLETE 4+ VIEW COMPARISON:  Oct 24, 2019 FINDINGS: There is no evidence of lumbar spine fracture. Alignment is normal. Degenerative joint changes of the spine with narrowed joint space and osteophyte formation are noted. IMPRESSION: No acute fracture or dislocation identified. Degenerative joint changes of spine. Electronically Signed   By: Abelardo Diesel M.D.   On: 05/14/2021 12:08   VAS [place] ABI WITH/WO TBI  Result Date: 04/20/2021  LOWER EXTREMITY DOPPLER STUDY Patient Name:  GAILE ALLMON  Date of Exam:   04/20/2021 Medical Rec #: [id number]      Accession #:    [id number] Date of Birth: 05/02/1934       Patient Gender: F Patient Age:   44 years Exam Location:  [facility] Procedure:      VAS [place] ABI WITH/WO TBI Referring Phys: Roderic Palau BERRY --------------------------------------------------------------------------------  Indications: Claudication, and peripheral artery disease. High Risk         Hypertension, hyperlipidemia, Diabetes, past history of Factors:          smoking. Other Factors: Patient complains of right hip pain when walking short                distances.  Vascular Interventions: History of left common iliac artery stent. Comparison Study: 11/2018-Right 0.53; Left 0.62 Performing Technologist: Wilkie Aye RVT  Examination Guidelines: A complete evaluation includes at minimum, Doppler waveform signals and systolic blood pressure reading at the level of bilateral brachial, anterior tibial, and posterior tibial arteries, when vessel segments are accessible. Bilateral testing is considered an integral part of a complete examination. Photoelectric Plethysmograph (PPG) waveforms and toe systolic pressure readings are included as required and additional duplex testing as needed. Limited examinations for reoccurring indications may be performed as noted.  ABI Findings: +---------+------------------+-----+-------------------+--------+ Right    Rt Pressure  (mmHg)IndexWaveform           Comment  +---------+------------------+-----+-------------------+--------+ Brachial 156                                                +---------+------------------+-----+-------------------+--------+ PTA      79                0.51 dampened monophasic         +---------+------------------+-----+-------------------+--------+ PERO     64                     dampened monophasic0.41     +---------+------------------+-----+-------------------+--------+ DP       59                0.38 dampened monophasic         +---------+------------------+-----+-------------------+--------+ Great Toe49                0.31 Abnormal                    +---------+------------------+-----+-------------------+--------+ +---------+------------------+-----+----------+-------+ Left     Lt Pressure (mmHg)IndexWaveform  Comment +---------+------------------+-----+----------+-------+ Brachial 127                                      +---------+------------------+-----+----------+-------+ PTA      103               0.66 monophasic        +---------+------------------+-----+----------+-------+ PERO     101                    monophasic0.65    +---------+------------------+-----+----------+-------+ DP       116               0.74 monophasic        +---------+------------------+-----+----------+-------+ Great Toe90                0.58 Abnormal          +---------+------------------+-----+----------+-------+ +-------+-----------+-----------+------------+------------+ ABI/TBIToday's ABIToday's TBIPrevious ABIPrevious TBI +-------+-----------+-----------+------------+------------+ Right  0.51       0.31       0.53        0.31         +-------+-----------+-----------+------------+------------+ Left   0.74       0.58       0.62        0.45         +-------+-----------+-----------+------------+------------+  Bilateral ABIs and TBIs appear  essentially unchanged compared to prior study on 11/2018.  Summary: Right: Resting right ankle-brachial index indicates moderate right lower extremity arterial disease. The right toe-brachial index is abnormal. Left: Resting left ankle-brachial index indicates moderate left lower extremity arterial disease. The left toe-brachial index is abnormal.  *See table(s) above for measurements and observations.  Suggest follow up study in 12 months. Electronically signed by Jenkins Rouge MD on 04/20/2021 at 3:49:04 PM.    Final    DG Hips Bilat W or Wo Pelvis 3-4 Views  Result Date: 05/14/2021 CLINICAL DATA:  Status post fall this morning with bilateral hip pain. EXAM: DG HIP (WITH OR WITHOUT PELVIS) 3-4V BILAT COMPARISON:  March 17, 2018 FINDINGS: There is no evidence of hip fracture or dislocation. Narrow bilateral hip joint spaces are identified. Degenerative joint changes of the visualized spine are noted. IMPRESSION: No acute fracture or dislocation. Electronically Signed   By: Abelardo Diesel M.D.   On: 05/14/2021 12:07   VAS US CAROTID  Result Date: 04/20/2021 Carotid Arterial Duplex Study Patient Name:  SAYLOR MURRY  Date of Exam:   04/20/2021 Medical Rec #: [id number]      Accession #:    [id number] Date of Birth: June 11, 1934       Patient Gender: F Patient Age:   26 years Exam Location:  [facility] Procedure:      VAS US CAROTID Referring Phys: Roderic Palau BERRY --------------------------------------------------------------------------------  Indications:       Carotid artery disease and Patient denies any cerebrovascular                    symptoms. Risk Factors:      Hypertension, hyperlipidemia, Diabetes, past history of                    smoking, PAD. Comparison Study:  12/2014-RICA velocity 25/42 cm/s; LICA velocity 70/62 cm/s Performing Technologist: Wilkie Aye RVT  Examination Guidelines: A complete evaluation includes B-mode imaging, spectral Doppler, color Doppler, and power Doppler as needed of all  accessible portions of  each vessel. Bilateral testing is considered an integral part of a complete examination. Limited examinations for reoccurring indications may be performed as noted.  Right Carotid Findings: +----------+--------+--------+--------+------------------+--------+           PSV cm/sEDV cm/sStenosisPlaque DescriptionComments +----------+--------+--------+--------+------------------+--------+ CCA Prox  52      4                                          +----------+--------+--------+--------+------------------+--------+ CCA Distal54      7                                          +----------+--------+--------+--------+------------------+--------+ ICA Prox  48      12      1-39%   heterogenous               +----------+--------+--------+--------+------------------+--------+ ICA Mid   62      16                                         +----------+--------+--------+--------+------------------+--------+ ICA Distal63      19                                         +----------+--------+--------+--------+------------------+--------+ ECA       66      7                                          +----------+--------+--------+--------+------------------+--------+ +----------+--------+-------+---------+-------------------+           PSV cm/sEDV cmsDescribe Arm Pressure (mmHG) +----------+--------+-------+---------+-------------------+ [id number]            [id number]                 +----------+--------+-------+---------+-------------------+ +---------+--------+--+--------+--+---------+ VertebralPSV cm/s63EDV cm/s15Antegrade +---------+--------+--+--------+--+---------+  Left Carotid Findings: +----------+--------+--------+--------+------------------+--------+           PSV cm/sEDV cm/sStenosisPlaque DescriptionComments +----------+--------+--------+--------+------------------+--------+ CCA Prox  70      9                                           +----------+--------+--------+--------+------------------+--------+ CCA Distal54      10                                         +----------+--------+--------+--------+------------------+--------+ ICA Prox  44      14      1-39%   heterogenous               +----------+--------+--------+--------+------------------+--------+ ICA Mid   67      21                                         +----------+--------+--------+--------+------------------+--------+ ICA Distal70  19                                         +----------+--------+--------+--------+------------------+--------+ ECA       57      4                                          +----------+--------+--------+--------+------------------+--------+ +----------+--------+--------+----------------+-------------------+           PSV cm/sEDV cm/sDescribe        Arm Pressure (mmHG) +----------+--------+--------+----------------+-------------------+ [id number]             Multiphasic, [id number]                 +----------+--------+--------+----------------+-------------------+ +---------+--------+--+--------+--+---------+ VertebralPSV cm/s53EDV cm/s16Antegrade +---------+--------+--+--------+--+---------+ Left BP is 29 mmHG lower than the left.  Summary: Right Carotid: Velocities in the right ICA are consistent with a 1-39% stenosis. Left Carotid: Velocities in the left ICA are consistent with a 1-39% stenosis. Vertebrals:  Bilateral vertebral arteries demonstrate antegrade flow. Subclavians: Right subclavian artery flow was disturbed. Normal flow              hemodynamics were seen in the left subclavian artery. *See table(s) above for measurements and observations.  Electronically signed by Jenkins Rouge MD on 04/20/2021 at 3:49:42 PM.    Final    VAS US AORTA/IVC/ILIACS  Result Date: 04/20/2021 ABDOMINAL AORTA STUDY Patient Name:  ADELE MILSON  Date of Exam:   04/20/2021 Medical Rec #:  [id number]      Accession #:    [id number] Date of Birth: [birth year]       Patient Gender: F Patient Age:   43 years Exam Location:  [facility] Procedure:      VAS US AORTA/IVC/ILIACS Referring Phys: Quay Burow --------------------------------------------------------------------------------  Indications: Follow up exam for known AAA. Left common iliac artery stent Risk Factors: Hypertension, hyperlipidemia, Diabetes, past history of smoking. Other Factors: Patient complains of right hip pain when walking short                distances. Vascular Interventions: Left common iliac artery stent. Limitations: Air/bowel gas.  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 Bilateral testing is considered an integral part of a complete examination. Limited examinations for reoccurring indications may be performed as noted.  Abdominal Aorta Findings: +-------------+-------+----------+----------+----------+--------+--------+ Location     AP (cm)Trans (cm)PSV (cm/s)Waveform  ThrombusComments +-------------+-------+----------+----------+----------+--------+--------+ Proximal     1.90   2.00      56                                   +-------------+-------+----------+----------+----------+--------+--------+ Mid          3.70   3.70      70                  Present fusiform +-------------+-------+----------+----------+----------+--------+--------+ Distal       3.10  3.30      115                 Present fusiform +-------------+-------+----------+----------+----------+--------+--------+ RT CIA Prox                   0         Occluded                    +-------------+-------+----------+----------+----------+--------+--------+ RT CIA Mid                    0         Occluded                   +-------------+-------+----------+----------+----------+--------+--------+ RT CIA Distal                 0         Occluded                   +-------------+-------+----------+----------+----------+--------+--------+ RT EIA Prox                   0         Occluded                   +-------------+-------+----------+----------+----------+--------+--------+ RT EIA Mid                    0         Occluded                   +-------------+-------+----------+----------+----------+--------+--------+ RT EIA Distal                 83        monophasic                 +-------------+-------+----------+----------+----------+--------+--------+ LT CIA Prox                   145       monophasic                 +-------------+-------+----------+----------+----------+--------+--------+ LT CIA Mid                    191       monophasic                 +-------------+-------+----------+----------+----------+--------+--------+ LT CIA Distal                 184       monophasic                 +-------------+-------+----------+----------+----------+--------+--------+ LT EIA Prox                   160       monophasic                 +-------------+-------+----------+----------+----------+--------+--------+ LT EIA Mid                    184       monophasic                 +-------------+-------+----------+----------+----------+--------+--------+ LT EIA Distal                 136       monophasic                 +-------------+-------+----------+----------+----------+--------+--------+ Unable to visualize stent  struts in the left CIA Unbable to duplicate elevated velocity in the left CIA. IVC/Iliac Findings: +--------+------+   IVC   Patent +--------+------+ IVC Proxpatent +--------+------+    Summary: Abdominal Aorta: There is evidence of abnormal dilatation of the mid and distal Abdominal aorta. The largest aortic measurement is 3.7 cm. The largest aortic diameter remains essentially unchanged compared to prior exam. Previous diameter measurement was  obtained on 11/2018. Stenosis: +--------------------+-------------+ Location            Stenosis      +--------------------+-------------+ Right Common Iliac  occluded      +--------------------+-------------+ Left Common Iliac   <50% stenosis +--------------------+-------------+ Right External Iliacoccluded      +--------------------+-------------+ Left External Iliac <50% stenosis +--------------------+-------------+   *See table(s) above for measurements and observations. Suggest follow up study in 12 months.  Electronically signed by Jenkins Rouge MD on 04/20/2021 at 3:58:57 PM.    Final     Orson Eva, DO  [facility]  If 7PM-7AM, please contact night-coverage [email or web address] Password [email or web address] 05/16/2021, 1:32 PM   LOS: 1 day

## 2021-05-16 NOTE — TOC Progression Note (Signed)
Transition of Care [facility]) - Progression Note    Patient Details  Name: Ariana Shea MRN: [id number] Date of Birth: 1934-04-27  [facility] CM/SW Contact  Natasha Bence, LCSW Phone Number: 05/16/2021, 3:27 PM  Clinical Narrative:    Patient agreeable to [facility]. CSW added [place] to [place]. TOC to follow.   Expected Discharge Plan: [place] Barriers to Discharge: Continued Medical Work up  Expected Discharge Plan and Services Expected Discharge Plan: [place] arrangements for the past 2 months: Apartment                                       Social Determinants of Health (SDOH) Interventions    Readmission Risk Interventions No flowsheet data found.

## 2021-05-16 NOTE — Progress Notes (Signed)
Per telemetry monitor, pt had a heart rate of 29. On charge Zenaida Deed, RN and Mardene Celeste, RN and this nurse checked on pt and performed 12 lead ekg and obtained vitals. Dr. Carles Collet made aware. Pt alert and no complaints at this time.

## 2021-05-17 DIAGNOSIS — R197 Diarrhea, unspecified: Secondary | ICD-10-CM | POA: Diagnosis not present

## 2021-05-17 DIAGNOSIS — R7881 Bacteremia: Secondary | ICD-10-CM | POA: Diagnosis not present

## 2021-05-17 DIAGNOSIS — J9601 Acute respiratory failure with hypoxia: Secondary | ICD-10-CM | POA: Diagnosis not present

## 2021-05-17 DIAGNOSIS — N179 Acute kidney failure, unspecified: Secondary | ICD-10-CM | POA: Diagnosis not present

## 2021-05-17 LAB — URINE CULTURE: Culture: 80000 — AB

## 2021-05-17 LAB — BASIC METABOLIC PANEL
Anion gap: 8 (ref 5–15)
BUN: 21 mg/dL (ref 8–23)
CO2: 23 mmol/L (ref 22–32)
Calcium: 8.4 mg/dL — ABNORMAL LOW (ref 8.9–10.3)
Chloride: 105 mmol/L (ref 98–111)
Creatinine, Ser: 0.85 mg/dL (ref 0.44–1.00)
GFR, Estimated: >60 mL/min (ref >60)
Glucose, Bld: 112 mg/dL — ABNORMAL HIGH (ref 70–99)
Potassium: 3.7 mmol/L (ref 3.5–5.1)
Sodium: 136 mmol/L (ref 135–145)

## 2021-05-17 LAB — MAGNESIUM: Magnesium: 1.3 mg/dL — ABNORMAL LOW (ref 1.7–2.4)

## 2021-05-17 LAB — GLUCOSE, CAPILLARY
Glucose-Capillary: 111 mg/dL — ABNORMAL HIGH (ref 70–99)
Glucose-Capillary: 106 mg/dL — ABNORMAL HIGH (ref 70–99)
Glucose-Capillary: 138 mg/dL — ABNORMAL HIGH (ref 70–99)
Glucose-Capillary: 130 mg/dL — ABNORMAL HIGH (ref 70–99)

## 2021-05-17 LAB — CULTURE, BLOOD (ROUTINE X 2)
Special Requests: ADEQUATE
Special Requests: ADEQUATE

## 2021-05-17 LAB — CBC
HCT: 37.1 % (ref 36.0–46.0)
Hemoglobin: 12.4 g/dL (ref 12.0–15.0)
MCH: 30.8 pg (ref 26.0–34.0)
MCHC: 33.4 g/dL (ref 30.0–36.0)
MCV: 92.1 fL (ref 80.0–100.0)
Platelets: 143 10*3/uL — ABNORMAL LOW (ref 150–400)
RBC: 4.03 MIL/uL (ref 3.87–5.11)
RDW: 12.8 % (ref 11.5–15.5)
WBC: 7.5 10*3/uL (ref 4.0–10.5)
nRBC: 0 % (ref 0.0–0.2)

## 2021-05-17 MED ORDER — MAGNESIUM SULFATE 4 GM/100ML IV SOLN
4.0000 g | Freq: Once | INTRAVENOUS | Status: AC
  Administered 2021-05-17: 12:00:00 4 g via INTRAVENOUS
  Filled 2021-05-17: qty 100

## 2021-05-17 MED ORDER — CEFADROXIL 500 MG PO CAPS
500.0000 mg | ORAL_CAPSULE | Freq: Two times a day (BID) | ORAL | Status: DC
  Administered 2021-05-17 – 2021-05-18 (×2): 500 mg via ORAL
  Filled 2021-05-17 (×6): qty 1

## 2021-05-17 MED ORDER — POTASSIUM CHLORIDE CRYS ER 20 MEQ PO TBCR
40.0000 meq | EXTENDED_RELEASE_TABLET | Freq: Once | ORAL | Status: AC
  Administered 2021-05-17: 21:00:00 40 meq via ORAL
  Filled 2021-05-17: qty 2

## 2021-05-17 NOTE — Progress Notes (Signed)
PROGRESS NOTE  Ariana Shea CBJ:[id number] DOB: [birth year] DOA: 05/14/2021 PCP: [facility]  Brief History:  87 y.o. female with medical history of hypertension, diabetes mellitus type 2, peripheral vascular disease, AAA, hyperlipidemia, chronic dilated right extrarenal pelvis/obstructive uropathy, CKD stage III, and carotid stenosis status post CEA bilateral presenting with generalized weakness that began on 05/12/2021.  The patient stated that she began having some myalgias and generalized weakness with a nonproductive cough on 05/12/2022.  She has subjective fevers and chills.  She had 1 episode of nausea and vomiting on that day without any blood.  She denied any headache, sore throat, chest pain, shortness of breath, hemoptysis, abdominal pain, hematochezia, melena.  She states that her oral intake has been poor for the last few days.  In the past 24 hours, the patient states that she has had 2 loose bowel movements without any blood.  There is no melena.  She denies any hematochezia, hematuria.  She denies any new medications.  On the morning of 05/14/2021, the patient was trying to get up from the couch, but was too weak and slid down the edge of the couch onto her buttocks.  She was unable to get up.  She stated that she was on the floor for about an hour before her son was able to come check up on her.  EMS was activated.  At baseline, the patient is able to ambulate and perform her own ADLs. In the emergency department, the patient was afebrile with initial soft blood pressure of 95/80.  Oxygen saturation was 94% on room air.  BMP shows sodium 137, potassium 3.7, bicarb 24, serum creatinine 1.1.  LFTs were unremarkable.  WBC 13.7, hemoglobin 12.8, platelets 140,000.  COVID-19 PCR was negative.  Chest x-ray was negative for any acute findings.  Lumbar x-ray was negative for fracture or dislocation.  The patient was given 1.5 L of fluid and started on IV  maintenance fluid.   Assessment/Plan: Sepsis -present on admission -due to UTI and bacteremia -lactic acid 1.4 -Improved with antibiotic therapy   E Coli bacteremia -start ceftriaxone pending culture data -source  = urine -Discussed with Dr. Drucilla Schmidt, with recommendations to transition to cefadroxil for total of 7 days of therapy   E Coli UTI -UA 21-50 WBC -Klebsiella also in urine -Transition ceftriaxone to cefadroxil  Acute respiratory failure with hypoxia -11/26--desaturated to 86% or RA -Chest x-ray without acute findings -She did receive a dose of Lasix -now stable on 2L>>98%  Positive C. Difficile stool assay -Noted to have antigen positive C. difficile, toxin negative, PCR positive -Since she is not having any loose stools, no leukocytosis or fevers, this is likely colonization and does not need active treatment -Discussed with infectious disease   AKI Baseline creatinine 0.6-0.8 -Presented with serum creatinine 1.81 -Secondary to volume depletion -Renal function back to baseline with IV fluids   Generalized weakness -Secondary to acute kidney injury and an UTI -COVID-19 PCR and influenza were negative. -Obtain UA 21-50 WBC -PT evaluation>>SNF   Chronic systolic and diastolic CHF -Patient appeared dry clinically initially -10/30/2019 echo EF 40 to 45%, grade 1 DD, mild MR   Essential hypertension -Holding amlodipine and carvedilol  Bradycardia -On 11/26, reported to have heart rate in the 20s on telemetry -Patient was asymptomatic.  On bedside eval by Dr. Carles Collet, her pulse was checked and noted to be in the 80-90 range.  EKG did not  show any high-grade AV blocks, but did comment on PACs and PVCs. -Magnesium is low will be replaced -Holding off on resuming beta-blockers for now   Diabetes mellitus type 2 -NovoLog sliding scale -Hemoglobin A1c--6.8   Hyperlipidemia -Continue statin   AAA -11/10/2020 CT abdomen--stable fusiform AAA up to 3.9 cm                  Status is: Inpatient   Remains inpatient appropriate because: due to severity of illness requiring IVF and IV abx               Family Communication:   no Family at bedside   Consultants:  none   Code Status:  FULL    DVT Prophylaxis:  Elim Heparin     Procedures: As Listed in Progress Note Above   Antibiotics: Ceftriaxone 11/25>> 11/27 Cefadroxil 11/27 >      Subjective: Her main complaint today is mild generalized weakness.  Staff notes that she has been having 1-2 bowel movements per day which are reportedly soft and not liquid.  Patient reports that this is her baseline.  Objective: Vitals:   05/16/21 1630 05/16/21 2008 05/17/21 0446 05/17/21 1456  BP: (!) 149/74 (!) 145/75 (!) 147/82 140/88  Pulse: 92 (!) 44 91 70  Resp: 19 18 18    Temp:  99.6 F (37.6 C) 98.4 F (36.9 C) 98.1 F (36.7 C)  TempSrc:  Oral Oral Oral  SpO2: 97% 90% 91% 90%  Weight:      Height:        Intake/Output Summary (Last 24 hours) at 05/17/2021 1744 Last data filed at 05/17/2021 1500 Gross per 24 hour  Intake 1863.89 ml  Output 1300 ml  Net 563.89 ml   Weight change:  Exam:  General exam: Alert, awake, oriented x 3 Respiratory system: Clear to auscultation. Respiratory effort normal. Cardiovascular system:RRR. No murmurs, rubs, gallops. Gastrointestinal system: Abdomen is nondistended, soft and nontender. No organomegaly or masses felt. Normal bowel sounds heard. Central nervous system: Alert and oriented. No focal neurological deficits. Extremities: No C/C/E, +pedal pulses Skin: No rashes, lesions or ulcers Psychiatry: Judgement and insight appear normal. Mood & affect appropriate.     Data Reviewed: I have personally reviewed following labs and imaging studies Basic Metabolic Panel: Recent Labs  Lab 05/14/21 1117 05/15/21 0507 05/16/21 0459 05/17/21 0258  NA 137 137 138 136  K 3.7 3.5 3.9 3.7  CL 103 107 109 105  CO2 24 19* 21* 23  GLUCOSE 165*  102* 83 112*  BUN 45* 36* 29* 21  CREATININE 1.81* 1.16* 1.01* 0.85  CALCIUM 8.5* 7.5* 7.8* 8.4*  MG  --  1.4* 1.4* 1.3*   Liver Function Tests: Recent Labs  Lab 05/14/21 1117  AST 19  ALT 14  ALKPHOS 68  BILITOT 0.9  PROT 6.3*  ALBUMIN 3.4*   Recent Labs  Lab 05/14/21 1708  LIPASE 24   No results for input(s): AMMONIA in the last 168 hours. Coagulation Profile: No results for input(s): INR, PROTIME in the last 168 hours. CBC: Recent Labs  Lab 05/14/21 1117 05/15/21 0507 05/16/21 0459 05/17/21 0258  WBC 13.7* 10.3 8.3 7.5  NEUTROABS 11.6*  --   --   --   HGB 12.8 11.1* 12.2 12.4  HCT 38.0 34.3* 38.5 37.1  MCV 93.6 94.0 94.8 92.1  PLT 140* 128* 120* 143*   Cardiac Enzymes: Recent Labs  Lab 05/14/21 1708  CKTOTAL 188   BNP: Invalid input(s):  POCBNP CBG: Recent Labs  Lab 05/16/21 1651 05/16/21 2008 05/17/21 0723 05/17/21 1051 05/17/21 1633  GLUCAP 97 158* 111* 106* 138*   HbA1C: No results for input(s): HGBA1C in the last 72 hours.  Urine analysis:    Component Value Date/Time   COLORURINE YELLOW 05/14/2021 1415   APPEARANCEUR CLOUDY (A) 05/14/2021 1415   LABSPEC 1.015 05/14/2021 1415   PHURINE 5.5 05/14/2021 1415   GLUCOSEU NEGATIVE 05/14/2021 1415   HGBUR SMALL (A) 05/14/2021 1415   BILIRUBINUR NEGATIVE 05/14/2021 1415   KETONESUR NEGATIVE 05/14/2021 1415   PROTEINUR TRACE (A) 05/14/2021 1415   UROBILINOGEN 0.2 06/22/2014 1305   NITRITE POSITIVE (A) 05/14/2021 1415   LEUKOCYTESUR MODERATE (A) 05/14/2021 1415   Sepsis Labs: @LABRCNTIP (procalcitonin:4,lacticidven:4) ) Recent Results (from the past 240 hour(s))  Resp Panel by RT-PCR (Flu A&B, Covid) Nasopharyngeal Swab     Status: None   Collection Time: 05/14/21 11:17 AM   Specimen: Nasopharyngeal Swab; Nasopharyngeal(NP) swabs in vial transport medium  Result Value Ref Range Status   SARS Coronavirus 2 by RT PCR NEGATIVE NEGATIVE Final    Comment: (NOTE) SARS-CoV-2 target nucleic  acids are NOT DETECTED.  The SARS-CoV-2 RNA is generally detectable in upper respiratory specimens during the acute phase of infection. The lowest concentration of SARS-CoV-2 viral copies this assay can detect is 138 copies/mL. A negative result does not preclude SARS-Cov-2 infection and should not be used as the sole basis for treatment or other patient management decisions. A negative result may occur with  improper specimen collection/handling, submission of specimen other than nasopharyngeal swab, presence of viral mutation(s) within the areas targeted by this assay, and inadequate number of viral copies(<138 copies/mL). A negative result must be combined with clinical observations, patient history, and epidemiological information. The expected result is Negative.  Fact Sheet for Patients:  EntrepreneurPulse.com.au  Fact Sheet for Healthcare Providers:  IncredibleEmployment.be  This test is no t yet approved or cleared by the [place] FDA and  has been authorized for detection and/or diagnosis of SARS-CoV-2 by FDA under an Emergency Use Authorization (EUA). This EUA will remain  in effect (meaning this test can be used) for the duration of the COVID-19 declaration under Section 564(b)(1) of the Act, 21 U.S.C.section 360bbb-3(b)(1), unless the authorization is terminated  or revoked sooner.       Influenza A by PCR NEGATIVE NEGATIVE Final   Influenza B by PCR NEGATIVE NEGATIVE Final    Comment: (NOTE) The Xpert Xpress SARS-CoV-2/FLU/RSV plus assay is intended as an aid in the diagnosis of influenza from Nasopharyngeal swab specimens and should not be used as a sole basis for treatment. Nasal washings and aspirates are unacceptable for Xpert Xpress SARS-CoV-2/FLU/RSV testing.  Fact Sheet for Patients: EntrepreneurPulse.com.au  Fact Sheet for Healthcare Providers: IncredibleEmployment.be  This  test is not yet approved or cleared by the [place] FDA and has been authorized for detection and/or diagnosis of SARS-CoV-2 by FDA under an Emergency Use Authorization (EUA). This EUA will remain in effect (meaning this test can be used) for the duration of the COVID-19 declaration under Section 564(b)(1) of the Act, 21 U.S.C. section 360bbb-3(b)(1), unless the authorization is terminated or revoked.  Performed at [facility], [address], [place], [place] [address]   Urine Culture     Status: Abnormal   Collection Time: 05/14/21  2:15 PM   Specimen: Urine, Clean Catch  Result Value Ref Range Status   Specimen Description   Final    URINE, CLEAN  CATCH Performed at [facility], [address] [place], [place] [address]    Special Requests   Final    NONE Performed at [facility], [address], [place], [place] [address]    Culture (A)  Final    80,000 COLONIES/mL KLEBSIELLA PNEUMONIAE 50,000 COLONIES/mL ESCHERICHIA COLI    Report Status 05/17/2021 FINAL  Final   Organism ID, Bacteria KLEBSIELLA PNEUMONIAE (A)  Final   Organism ID, Bacteria ESCHERICHIA COLI (A)  Final      Susceptibility   Escherichia coli - MIC*    AMPICILLIN 4 SENSITIVE Sensitive     CEFAZOLIN <=4 SENSITIVE Sensitive     CEFEPIME <=0.12 SENSITIVE Sensitive     CEFTRIAXONE <=0.25 SENSITIVE Sensitive     CIPROFLOXACIN <=0.25 SENSITIVE Sensitive     GENTAMICIN <=1 SENSITIVE Sensitive     IMIPENEM <=0.25 SENSITIVE Sensitive     NITROFURANTOIN <=16 SENSITIVE Sensitive     TRIMETH/SULFA <=20 SENSITIVE Sensitive     AMPICILLIN/SULBACTAM <=2 SENSITIVE Sensitive     PIP/TAZO <=4 SENSITIVE Sensitive     * 50,000 COLONIES/mL ESCHERICHIA COLI   Klebsiella pneumoniae - MIC*    AMPICILLIN >=32 RESISTANT Resistant     CEFAZOLIN <=4 SENSITIVE Sensitive     CEFEPIME <=0.12 SENSITIVE Sensitive     CEFTRIAXONE <=0.25 SENSITIVE Sensitive     CIPROFLOXACIN <=0.25 SENSITIVE Sensitive     GENTAMICIN <=1  SENSITIVE Sensitive     IMIPENEM <=0.25 SENSITIVE Sensitive     NITROFURANTOIN 32 SENSITIVE Sensitive     TRIMETH/SULFA <=20 SENSITIVE Sensitive     AMPICILLIN/SULBACTAM 8 SENSITIVE Sensitive     PIP/TAZO <=4 SENSITIVE Sensitive     * 80,000 COLONIES/mL KLEBSIELLA PNEUMONIAE  Respiratory (~20 pathogens) panel by PCR     Status: None   Collection Time: 05/14/21  4:04 PM   Specimen: Nasopharyngeal Swab; Respiratory  Result Value Ref Range Status   Adenovirus NOT DETECTED NOT DETECTED Final   Coronavirus 229E NOT DETECTED NOT DETECTED Final    Comment: (NOTE) The Coronavirus on the Respiratory Panel, DOES NOT test for the novel  Coronavirus (2019 nCoV)    Coronavirus HKU1 NOT DETECTED NOT DETECTED Final   Coronavirus NL63 NOT DETECTED NOT DETECTED Final   Coronavirus OC43 NOT DETECTED NOT DETECTED Final   Metapneumovirus NOT DETECTED NOT DETECTED Final   Rhinovirus / Enterovirus NOT DETECTED NOT DETECTED Final   Influenza A NOT DETECTED NOT DETECTED Final   Influenza B NOT DETECTED NOT DETECTED Final   Parainfluenza Virus 1 NOT DETECTED NOT DETECTED Final   Parainfluenza Virus 2 NOT DETECTED NOT DETECTED Final   Parainfluenza Virus 3 NOT DETECTED NOT DETECTED Final   Parainfluenza Virus 4 NOT DETECTED NOT DETECTED Final   Respiratory Syncytial Virus NOT DETECTED NOT DETECTED Final   Bordetella pertussis NOT DETECTED NOT DETECTED Final   Bordetella Parapertussis NOT DETECTED NOT DETECTED Final   Chlamydophila pneumoniae NOT DETECTED NOT DETECTED Final   Mycoplasma pneumoniae NOT DETECTED NOT DETECTED Final    Comment: Performed at [facility], [place] [address], [place], [place] [address]  Culture, blood (Routine X 2) w Reflex to ID Panel     Status: Abnormal   Collection Time: 05/14/21  5:08 PM   Specimen: BLOOD RIGHT ARM  Result Value Ref Range Status   Specimen Description   Final    BLOOD RIGHT ARM Performed at [facility], [address], [place], [place] [address]     Special Requests  Final    Blood Culture adequate volume BOTTLES DRAWN AEROBIC AND ANAEROBIC Performed at [facility], [address], [place], [place] [address]    Culture  Setup Time   Final    GRAM NEGATIVE RODS IN BOTH AEROBIC AND ANAEROBIC BOTTLES Gram Stain Report Called to,Read Back By and Verified With: BROWN,J@0651  BY MATTHEWS, B 11.25.22 CRITICAL RESULT CALLED TO, READ BACK BY AND VERIFIED WITH: G,COFFEE PHARMD @0952  05/15/21 EB Performed at [facility], [place] [address], [place], [place] [address]    Culture ESCHERICHIA COLI (A)  Final   Report Status 05/17/2021 FINAL  Final   Organism ID, Bacteria ESCHERICHIA COLI  Final      Susceptibility   Escherichia coli - MIC*    AMPICILLIN 4 SENSITIVE Sensitive     CEFAZOLIN <=4 SENSITIVE Sensitive     CEFEPIME <=0.12 SENSITIVE Sensitive     CEFTAZIDIME <=1 SENSITIVE Sensitive     CEFTRIAXONE <=0.25 SENSITIVE Sensitive     CIPROFLOXACIN <=0.25 SENSITIVE Sensitive     GENTAMICIN <=1 SENSITIVE Sensitive     IMIPENEM <=0.25 SENSITIVE Sensitive     TRIMETH/SULFA <=20 SENSITIVE Sensitive     AMPICILLIN/SULBACTAM <=2 SENSITIVE Sensitive     PIP/TAZO <=4 SENSITIVE Sensitive     * ESCHERICHIA COLI  Culture, blood (Routine X 2) w Reflex to ID Panel     Status: Abnormal   Collection Time: 05/14/21  5:08 PM   Specimen: BLOOD LEFT HAND  Result Value Ref Range Status   Specimen Description   Final    BLOOD LEFT HAND Performed at [facility], [address], [place], [place] [address]    Special Requests   Final    Blood Culture adequate volume BOTTLES DRAWN AEROBIC AND ANAEROBIC Performed at [facility], [address] [place], [place] [address]    Culture  Setup Time   Final    GRAM NEGATIVE RODS IN BOTH AEROBIC AND ANAEROBIC BOTTLES Gram Stain Report Called to,Read Back By and Verified With: BROWN,J@0651  BY MATTHEWS, B 11.25.22 CRITICAL VALUE NOTED.  VALUE IS CONSISTENT WITH PREVIOUSLY REPORTED AND CALLED VALUE.    Culture  (A)  Final    ESCHERICHIA COLI SUSCEPTIBILITIES PERFORMED ON PREVIOUS CULTURE WITHIN THE LAST 5 DAYS. Performed at [facility], [place] [address], [place], [place] [address]    Report Status 05/17/2021 FINAL  Final  Blood Culture ID Panel (Reflexed)     Status: Abnormal   Collection Time: 05/14/21  5:08 PM  Result Value Ref Range Status   Enterococcus faecalis NOT DETECTED NOT DETECTED Final   Enterococcus Faecium NOT DETECTED NOT DETECTED Final   Listeria monocytogenes NOT DETECTED NOT DETECTED Final   Staphylococcus species NOT DETECTED NOT DETECTED Final   Staphylococcus aureus (BCID) NOT DETECTED NOT DETECTED Final   Staphylococcus epidermidis NOT DETECTED NOT DETECTED Final   Staphylococcus lugdunensis NOT DETECTED NOT DETECTED Final   Streptococcus species NOT DETECTED NOT DETECTED Final   Streptococcus agalactiae NOT DETECTED NOT DETECTED Final   Streptococcus pneumoniae NOT DETECTED NOT DETECTED Final   Streptococcus pyogenes NOT DETECTED NOT DETECTED Final   A.calcoaceticus-baumannii NOT DETECTED NOT DETECTED Final   Bacteroides fragilis NOT DETECTED NOT DETECTED Final   Enterobacterales DETECTED (A) NOT DETECTED Final    Comment: Enterobacterales represent a large order of gram negative bacteria, not a single organism. CRITICAL RESULT CALLED TO, READ BACK BY AND VERIFIED WITH: G,COFFEE PHARMD @0952  05/15/21 EB    Enterobacter cloacae complex NOT DETECTED NOT  DETECTED Final   Escherichia coli DETECTED (A) NOT DETECTED Final    Comment: CRITICAL RESULT CALLED TO, READ BACK BY AND VERIFIED WITH: G,COFFEE PHARMD @0952  05/15/21 EB    Klebsiella aerogenes NOT DETECTED NOT DETECTED Final   Klebsiella oxytoca NOT DETECTED NOT DETECTED Final   Klebsiella pneumoniae NOT DETECTED NOT DETECTED Final   Proteus species NOT DETECTED NOT DETECTED Final   Salmonella species NOT DETECTED NOT DETECTED Final   Serratia marcescens NOT DETECTED NOT DETECTED Final   Haemophilus  influenzae NOT DETECTED NOT DETECTED Final   Neisseria meningitidis NOT DETECTED NOT DETECTED Final   Pseudomonas aeruginosa NOT DETECTED NOT DETECTED Final   Stenotrophomonas maltophilia NOT DETECTED NOT DETECTED Final   Candida albicans NOT DETECTED NOT DETECTED Final   Candida auris NOT DETECTED NOT DETECTED Final   Candida glabrata NOT DETECTED NOT DETECTED Final   Candida krusei NOT DETECTED NOT DETECTED Final   Candida parapsilosis NOT DETECTED NOT DETECTED Final   Candida tropicalis NOT DETECTED NOT DETECTED Final   Cryptococcus neoformans/gattii NOT DETECTED NOT DETECTED Final   CTX-M ESBL NOT DETECTED NOT DETECTED Final   Carbapenem resistance IMP NOT DETECTED NOT DETECTED Final   Carbapenem resistance KPC NOT DETECTED NOT DETECTED Final   Carbapenem resistance NDM NOT DETECTED NOT DETECTED Final   Carbapenem resist OXA 48 LIKE NOT DETECTED NOT DETECTED Final   Carbapenem resistance VIM NOT DETECTED NOT DETECTED Final    Comment: Performed at [facility], [address], [place], [place] [address]  C Difficile Quick Screen w PCR reflex     Status: Abnormal   Collection Time: 05/15/21 11:08 AM   Specimen: STOOL  Result Value Ref Range Status   C Diff antigen POSITIVE (A) NEGATIVE Final   C Diff toxin NEGATIVE NEGATIVE Final   C Diff interpretation Results are indeterminate. See PCR results.  Final    Comment: Performed at [facility], [address] [place], [place] [address]  C. Diff by PCR, Reflexed     Status: Abnormal   Collection Time: 05/15/21 11:08 AM  Result Value Ref Range Status   Toxigenic C. Difficile by PCR POSITIVE (A) NEGATIVE Final    Comment: Positive for toxigenic C. difficile with little to no toxin production. Only treat if clinical presentation suggests symptomatic illness. Performed at [facility], [place] [address] [place], [place] [address]      Scheduled Meds:  cefadroxil  500 mg Oral BID   heparin  5,000 Units Subcutaneous Q8H    insulin aspart  0-9 Units Subcutaneous TID WC   pantoprazole  40 mg Oral Daily   pravastatin  80 mg Oral q1800   Continuous Infusions:    Procedures/Studies: DG Chest 1 View  Result Date: 05/14/2021 CLINICAL DATA:  Status post fall this morning with low back pain and bilateral hip pain. EXAM: CHEST  1 VIEW COMPARISON:  Oct 29, 2019 FINDINGS: The heart size and mediastinal contours are stable. Both lungs are clear. The visualized skeletal structures are unremarkable. IMPRESSION: No active disease. Electronically Signed   By: Abelardo Diesel M.D.   On: 05/14/2021 12:09   DG Lumbar Spine Complete  Result Date: 05/14/2021 CLINICAL DATA:  Status post fall this morning with back pain. EXAM: LUMBAR SPINE - COMPLETE 4+ VIEW COMPARISON:  Oct 24, 2019 FINDINGS: There is no evidence of lumbar spine fracture. Alignment is normal. Degenerative joint changes of the spine with narrowed joint space and osteophyte formation are noted. IMPRESSION:  No acute fracture or dislocation identified. Degenerative joint changes of spine. Electronically Signed   By: Abelardo Diesel M.D.   On: 05/14/2021 12:08   DG CHEST PORT 1 VIEW  Result Date: 05/16/2021 CLINICAL DATA:  Generalized weakness and cough, history skin cancer, diabetes mellitus, hypertension EXAM: PORTABLE CHEST 1 VIEW COMPARISON:  Portable exam 1336 hours compared to 05/14/2021 FINDINGS: Normal heart size, mediastinal contours, and pulmonary vascularity. Atherosclerotic calcification aorta. Minimal subsegmental atelectasis LEFT base and RIGHT midlung. Lungs otherwise clear. No acute infiltrate, pleural effusion, or pneumothorax. Bones demineralized. IMPRESSION: Subsegmental atelectasis bilaterally. No acute infiltrate. Aortic Atherosclerosis (ICD10-I70.0). Electronically Signed   By: Lavonia Dana M.D.   On: 05/16/2021 14:11   VAS [place] ABI WITH/WO TBI  Result Date: 04/20/2021  LOWER EXTREMITY DOPPLER STUDY Patient Name:  ZURIE PLATAS  Date of Exam:   04/20/2021  Medical Rec #: [id number]      Accession #:    [id number] Date of Birth: 25-Nov-1933       Patient Gender: F Patient Age:   69 years Exam Location:  [facility] Procedure:      VAS [place] ABI WITH/WO TBI Referring Phys: Roderic Palau BERRY --------------------------------------------------------------------------------  Indications: Claudication, and peripheral artery disease. High Risk         Hypertension, hyperlipidemia, Diabetes, past history of Factors:          smoking. Other Factors: Patient complains of right hip pain when walking short                distances.  Vascular Interventions: History of left common iliac artery stent. Comparison Study: 11/2018-Right 0.53; Left 0.62 Performing Technologist: Wilkie Aye RVT  Examination Guidelines: A complete evaluation includes at minimum, Doppler waveform signals and systolic blood pressure reading at the level of bilateral brachial, anterior tibial, and posterior tibial arteries, when vessel segments are accessible. Bilateral testing is considered an integral part of a complete examination. Photoelectric Plethysmograph (PPG) waveforms and toe systolic pressure readings are included as required and additional duplex testing as needed. Limited examinations for reoccurring indications may be performed as noted.  ABI Findings: +---------+------------------+-----+-------------------+--------+ Right    Rt Pressure (mmHg)IndexWaveform           Comment  +---------+------------------+-----+-------------------+--------+ Brachial 156                                                +---------+------------------+-----+-------------------+--------+ PTA      79                0.51 dampened monophasic         +---------+------------------+-----+-------------------+--------+ PERO     64                     dampened monophasic0.41     +---------+------------------+-----+-------------------+--------+ DP       59                0.38 dampened monophasic          +---------+------------------+-----+-------------------+--------+ Great Toe49                0.31 Abnormal                    +---------+------------------+-----+-------------------+--------+ +---------+------------------+-----+----------+-------+ Left     Lt Pressure (mmHg)IndexWaveform  Comment +---------+------------------+-----+----------+-------+ Brachial 127                                      +---------+------------------+-----+----------+-------+  PTA      103               0.66 monophasic        +---------+------------------+-----+----------+-------+ PERO     101                    monophasic0.65    +---------+------------------+-----+----------+-------+ DP       116               0.74 monophasic        +---------+------------------+-----+----------+-------+ Great Toe90                0.58 Abnormal          +---------+------------------+-----+----------+-------+ +-------+-----------+-----------+------------+------------+ ABI/TBIToday's ABIToday's TBIPrevious ABIPrevious TBI +-------+-----------+-----------+------------+------------+ Right  0.51       0.31       0.53        0.31         +-------+-----------+-----------+------------+------------+ Left   0.74       0.58       0.62        0.45         +-------+-----------+-----------+------------+------------+  Bilateral ABIs and TBIs appear essentially unchanged compared to prior study on 11/2018.  Summary: Right: Resting right ankle-brachial index indicates moderate right lower extremity arterial disease. The right toe-brachial index is abnormal. Left: Resting left ankle-brachial index indicates moderate left lower extremity arterial disease. The left toe-brachial index is abnormal.  *See table(s) above for measurements and observations.  Suggest follow up study in 12 months. Electronically signed by Jenkins Rouge MD on 04/20/2021 at 3:49:04 PM.    Final    DG Hips Bilat W or Wo Pelvis 3-4  Views  Result Date: 05/14/2021 CLINICAL DATA:  Status post fall this morning with bilateral hip pain. EXAM: DG HIP (WITH OR WITHOUT PELVIS) 3-4V BILAT COMPARISON:  March 17, 2018 FINDINGS: There is no evidence of hip fracture or dislocation. Narrow bilateral hip joint spaces are identified. Degenerative joint changes of the visualized spine are noted. IMPRESSION: No acute fracture or dislocation. Electronically Signed   By: Abelardo Diesel M.D.   On: 05/14/2021 12:07   VAS US CAROTID  Result Date: 04/20/2021 Carotid Arterial Duplex Study Patient Name:  MARYCLARE NYDAM  Date of Exam:   04/20/2021 Medical Rec #: [id number]      Accession #:    [id number] Date of Birth: [birth year]       Patient Gender: F Patient Age:   67 years Exam Location:  [facility] Procedure:      VAS US CAROTID Referring Phys: Roderic Palau BERRY --------------------------------------------------------------------------------  Indications:       Carotid artery disease and Patient denies any cerebrovascular                    symptoms. Risk Factors:      Hypertension, hyperlipidemia, Diabetes, past history of                    smoking, PAD. Comparison Study:  12/2014-RICA velocity 85/46 cm/s; LICA velocity 27/03 cm/s Performing Technologist: Wilkie Aye RVT  Examination Guidelines: A complete evaluation includes B-mode imaging, spectral Doppler, color Doppler, and power Doppler as needed of all accessible portions of each vessel. Bilateral testing is considered an integral part of a complete examination. Limited examinations for reoccurring indications may be performed as noted.  Right Carotid Findings: +----------+--------+--------+--------+------------------+--------+           PSV cm/sEDV cm/sStenosisPlaque DescriptionComments +----------+--------+--------+--------+------------------+--------+  CCA Prox  52      4                                          +----------+--------+--------+--------+------------------+--------+ CCA  Distal54      7                                          +----------+--------+--------+--------+------------------+--------+ ICA Prox  48      12      1-39%   heterogenous               +----------+--------+--------+--------+------------------+--------+ ICA Mid   62      16                                         +----------+--------+--------+--------+------------------+--------+ ICA Distal63      17                                         +----------+--------+--------+--------+------------------+--------+ ECA       66      7                                          +----------+--------+--------+--------+------------------+--------+ +----------+--------+-------+---------+-------------------+           PSV cm/sEDV cmsDescribe Arm Pressure (mmHG) +----------+--------+-------+---------+-------------------+ [id number]            [id number]                 +----------+--------+-------+---------+-------------------+ +---------+--------+--+--------+--+---------+ VertebralPSV cm/s63EDV cm/s15Antegrade +---------+--------+--+--------+--+---------+  Left Carotid Findings: +----------+--------+--------+--------+------------------+--------+           PSV cm/sEDV cm/sStenosisPlaque DescriptionComments +----------+--------+--------+--------+------------------+--------+ CCA Prox  70      9                                          +----------+--------+--------+--------+------------------+--------+ CCA Distal54      10                                         +----------+--------+--------+--------+------------------+--------+ ICA Prox  44      14      1-39%   heterogenous               +----------+--------+--------+--------+------------------+--------+ ICA Mid   67      21                                         +----------+--------+--------+--------+------------------+--------+ ICA Distal70      19                                          +----------+--------+--------+--------+------------------+--------+  ECA       57      4                                          +----------+--------+--------+--------+------------------+--------+ +----------+--------+--------+----------------+-------------------+           PSV cm/sEDV cm/sDescribe        Arm Pressure (mmHG) +----------+--------+--------+----------------+-------------------+ [id number]             Multiphasic, [id number]                 +----------+--------+--------+----------------+-------------------+ +---------+--------+--+--------+--+---------+ VertebralPSV cm/s53EDV cm/s16Antegrade +---------+--------+--+--------+--+---------+ Left BP is 29 mmHG lower than the left.  Summary: Right Carotid: Velocities in the right ICA are consistent with a 1-39% stenosis. Left Carotid: Velocities in the left ICA are consistent with a 1-39% stenosis. Vertebrals:  Bilateral vertebral arteries demonstrate antegrade flow. Subclavians: Right subclavian artery flow was disturbed. Normal flow              hemodynamics were seen in the left subclavian artery. *See table(s) above for measurements and observations.  Electronically signed by Jenkins Rouge MD on 04/20/2021 at 3:49:42 PM.    Final    VAS US AORTA/IVC/ILIACS  Result Date: 04/20/2021 ABDOMINAL AORTA STUDY Patient Name:  SOLARA GOODCHILD  Date of Exam:   04/20/2021 Medical Rec #: [id number]      Accession #:    [id number] Date of Birth: 1933-08-11       Patient Gender: F Patient Age:   37 years Exam Location:  [facility] Procedure:      VAS US AORTA/IVC/ILIACS Referring Phys: Quay Burow --------------------------------------------------------------------------------  Indications: Follow up exam for known AAA. Left common iliac artery stent Risk Factors: Hypertension, hyperlipidemia, Diabetes, past history of smoking. Other Factors: Patient complains of right hip pain when walking short                distances. Vascular  Interventions: Left common iliac artery stent. Limitations: Air/bowel gas.  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 Bilateral testing is considered an integral part of a complete examination. Limited examinations for reoccurring indications may be performed as noted.  Abdominal Aorta Findings: +-------------+-------+----------+----------+----------+--------+--------+ Location     AP (cm)Trans (cm)PSV (cm/s)Waveform  ThrombusComments +-------------+-------+----------+----------+----------+--------+--------+ Proximal     1.90   2.00      56                                   +-------------+-------+----------+----------+----------+--------+--------+ Mid          3.70   3.70      70                  Present fusiform +-------------+-------+----------+----------+----------+--------+--------+ Distal       3.10   3.30      115                 Present fusiform +-------------+-------+----------+----------+----------+--------+--------+ RT CIA Prox  0         Occluded                   +-------------+-------+----------+----------+----------+--------+--------+ RT CIA Mid                    0         Occluded                   +-------------+-------+----------+----------+----------+--------+--------+ RT CIA Distal                 0         Occluded                   +-------------+-------+----------+----------+----------+--------+--------+ RT EIA Prox                   0         Occluded                   +-------------+-------+----------+----------+----------+--------+--------+ RT EIA Mid                    0         Occluded                    +-------------+-------+----------+----------+----------+--------+--------+ RT EIA Distal                 83        monophasic                 +-------------+-------+----------+----------+----------+--------+--------+ LT CIA Prox                   145       monophasic                 +-------------+-------+----------+----------+----------+--------+--------+ LT CIA Mid                    191       monophasic                 +-------------+-------+----------+----------+----------+--------+--------+ LT CIA Distal                 184       monophasic                 +-------------+-------+----------+----------+----------+--------+--------+ LT EIA Prox                   160       monophasic                 +-------------+-------+----------+----------+----------+--------+--------+ LT EIA Mid                    184       monophasic                 +-------------+-------+----------+----------+----------+--------+--------+ LT EIA Distal                 136       monophasic                 +-------------+-------+----------+----------+----------+--------+--------+ Unable to visualize stent struts in the left CIA Unbable to duplicate elevated velocity in the left CIA. IVC/Iliac Findings: +--------+------+   IVC   Patent +--------+------+ IVC Proxpatent +--------+------+   Summary: Abdominal Aorta: There is evidence of abnormal dilatation of the mid and distal Abdominal aorta. The largest  aortic measurement is 3.7 cm. The largest aortic diameter remains essentially unchanged compared to prior exam. Previous diameter measurement was  obtained on 11/2018. Stenosis: +--------------------+-------------+ Location            Stenosis      +--------------------+-------------+ Right Common Iliac  occluded      +--------------------+-------------+ Left Common Iliac   <50% stenosis +--------------------+-------------+ Right External Iliacoccluded       +--------------------+-------------+ Left External Iliac <50% stenosis +--------------------+-------------+   *See table(s) above for measurements and observations. Suggest follow up study in 12 months.  Electronically signed by Jenkins Rouge MD on 04/20/2021 at 3:58:57 PM.    Final     Kathie Dike, MD  [facility]  If 7PM-7AM, please contact night-coverage [email or web address]  05/17/2021, 5:44 PM   LOS: 2 days

## 2021-05-18 ENCOUNTER — Inpatient Hospital Stay (HOSPITAL_COMMUNITY): Payer: 59

## 2021-05-18 DIAGNOSIS — R0902 Hypoxemia: Secondary | ICD-10-CM

## 2021-05-18 DIAGNOSIS — J9601 Acute respiratory failure with hypoxia: Secondary | ICD-10-CM | POA: Diagnosis not present

## 2021-05-18 DIAGNOSIS — R7881 Bacteremia: Secondary | ICD-10-CM | POA: Diagnosis not present

## 2021-05-18 DIAGNOSIS — A4151 Sepsis due to Escherichia coli [E. coli]: Secondary | ICD-10-CM | POA: Diagnosis not present

## 2021-05-18 DIAGNOSIS — N179 Acute kidney failure, unspecified: Secondary | ICD-10-CM | POA: Diagnosis not present

## 2021-05-18 LAB — BASIC METABOLIC PANEL
Anion gap: 8 (ref 5–15)
BUN: 16 mg/dL (ref 8–23)
CO2: 26 mmol/L (ref 22–32)
Calcium: 8.7 mg/dL — ABNORMAL LOW (ref 8.9–10.3)
Chloride: 104 mmol/L (ref 98–111)
Creatinine, Ser: 0.83 mg/dL (ref 0.44–1.00)
GFR, Estimated: >60 mL/min (ref >60)
Glucose, Bld: 122 mg/dL — ABNORMAL HIGH (ref 70–99)
Potassium: 3.9 mmol/L (ref 3.5–5.1)
Sodium: 138 mmol/L (ref 135–145)

## 2021-05-18 LAB — MAGNESIUM: Magnesium: 2 mg/dL (ref 1.7–2.4)

## 2021-05-18 LAB — GLUCOSE, CAPILLARY: Glucose-Capillary: 114 mg/dL — ABNORMAL HIGH (ref 70–99)

## 2021-05-18 MED ORDER — CARVEDILOL 6.25 MG PO TABS
3.1250 mg | ORAL_TABLET | Freq: Two times a day (BID) | ORAL | 3 refills | Status: AC
Start: 2021-05-18 — End: 2021-06-17

## 2021-05-18 MED ORDER — CEFADROXIL 500 MG PO CAPS: 500.0000 mg | ORAL_CAPSULE | Freq: Two times a day (BID) | ORAL | 0 refills | Status: AC

## 2021-05-18 MED ORDER — IOHEXOL 300 MG/ML  SOLN
100.0000 mL | Freq: Once | INTRAMUSCULAR | Status: AC | PRN
  Administered 2021-05-18: 13:00:00 100 mL via INTRAVENOUS

## 2021-05-18 NOTE — Discharge Summary (Signed)
Physician Discharge Summary  SERETHA ESTABROOKS PIR:[id number] DOB: 04/01/1934 DOA: 05/14/2021  PCP: [place], [place] date: 05/14/2021 Discharge date: 05/18/2021  Admitted From: home Disposition:  SNF  Recommendations for Outpatient Follow-up:  Follow up with PCP in 1-2 weeks Please obtain BMP/CBC in one week  Discharge Condition:stable CODE STATUS:full code Diet recommendation: heart healthy, carb modified  Brief/Interim Summary: 87 y.o. female with medical history of hypertension, diabetes mellitus type 2, peripheral vascular disease, AAA, hyperlipidemia, chronic dilated right extrarenal pelvis/obstructive uropathy, CKD stage III, and carotid stenosis status post CEA bilateral presenting with generalized weakness that began on 05/12/2021.  The patient stated that she began having some myalgias and generalized weakness with a nonproductive cough on 05/12/2022.  She has subjective fevers and chills.  She had 1 episode of nausea and vomiting on that day without any blood.  She denied any headache, sore throat, chest pain, shortness of breath, hemoptysis, abdominal pain, hematochezia, melena.  She states that her oral intake has been poor for the last few days.  In the past 24 hours, the patient states that she has had 2 loose bowel movements without any blood.  There is no melena.  She denies any hematochezia, hematuria.  She denies any new medications.  On the morning of 05/14/2021, the patient was trying to get up from the couch, but was too weak and slid down the edge of the couch onto her buttocks.  She was unable to get up.  She stated that she was on the floor for about an hour before her son was able to come check up on her.  EMS was activated.  At baseline, the patient is able to ambulate and perform her own ADLs. In the emergency department, the patient was afebrile with initial soft blood pressure of 95/80.  Oxygen saturation was 94% on room air.  BMP shows  sodium 137, potassium 3.7, bicarb 24, serum creatinine 1.1.  LFTs were unremarkable.  WBC 13.7, hemoglobin 12.8, platelets 140,000.  COVID-19 PCR was negative.  Chest x-ray was negative for any acute findings.  Lumbar x-ray was negative for fracture or dislocation.  The patient was given 1.5 L of fluid and started on IV maintenance fluid.    Discharge Diagnoses:  Principal Problem:   AKI (acute kidney injury) ([place]) Active Problems:   Peripheral arterial disease (HCC)   PVD (peripheral vascular disease) (HCC)   Systolic and diastolic CHF, chronic (HCC)   Escherichia coli sepsis (HCC)   E coli bacteremia   Acute respiratory failure with hypoxia (HCC)  Sepsis -present on admission -due to UTI and bacteremia -lactic acid 1.4 -Improved with antibiotic therapy   E Coli bacteremia -start ceftriaxone pending culture data -source  = urine -Discussed with Dr. Drucilla Schmidt, with recommendations to transition to cefadroxil for total of 7 days of therapy   E Coli UTI -UA 21-50 WBC -Klebsiella also in urine -Transition ceftriaxone to cefadroxil   Acute respiratory failure with hypoxia -11/26--desaturated to 86% or RA -Chest x-ray without acute findings -She did receive a dose of Lasix -now stable on RA   Positive C. Difficile stool assay -Noted to have antigen positive C. difficile, toxin negative, PCR positive -Since she is not having any loose stools, no leukocytosis or fevers, this is likely colonization and does not need active treatment -Discussed with infectious disease   AKI Baseline creatinine 0.6-0.8 -Presented with serum creatinine 1.81 -Secondary to volume depletion -Renal function back to baseline with IV fluids  Generalized weakness -Secondary to acute kidney injury and an UTI -COVID-19 PCR and influenza were negative. -Obtain UA 21-50 WBC -PT evaluation>>SNF   Chronic systolic and diastolic CHF -Patient appeared dry clinically initially -10/30/2019 echo EF 40 to 45%,  grade 1 DD, mild MR -it does not appear that she is on chronic diuretics -volume status appears euvolemic currently   Essential hypertension -resume low dose coreg on discharge   Bradycardia -On 11/26, reported to have heart rate in the 20s on telemetry -Patient was asymptomatic.  On bedside eval by Dr. Carles Collet, her pulse was checked and noted to be in the 80-90 range.  EKG did not show any high-grade AV blocks, but did comment on PACs and PVCs. -Magnesium is low will be replaced -no further bradycardic episodes -blood pressure trending up and HR in 80s -no further PVCs on tele -resume low dose coreg   Diabetes mellitus type 2 -NovoLog sliding scale -Hemoglobin A1c--6.8 -she has not required any basal insulin through hospital stay and blood sugars have been stable   Hyperlipidemia -Continue statin   AAA -CT abdomen--stable fusiform AAA up to 3.7 cm -continue outpatient follow up with ultrasound every 2 years       Discharge Instructions   Allergies as of 05/18/2021       Reactions   Lisinopril    Other reaction(s): Other (See Comments)  Decreased kidney functions   Red Blood Cells Other (See Comments)   Patient is a Jehovah's Witness   Penicillins Rash   Sulfonamide Derivatives Rash        Medication List     STOP taking these medications    amLODipine 5 MG tablet Commonly known as: NORVASC   insulin glargine 100 UNIT/ML Solostar Pen Commonly known as: LANTUS   insulin glargine-yfgn 100 UNIT/ML Pen Commonly known as: SEMGLEE   moxifloxacin 0.5 % ophthalmic solution Commonly known as: VIGAMOX   OVER THE COUNTER MEDICATION       TAKE these medications    acetaminophen 325 MG tablet Commonly known as: TYLENOL Take 2 tablets (650 mg total) by mouth every 6 (six) hours as needed for mild pain (or Fever >/= 101).   carvedilol 6.25 MG tablet Commonly known as: COREG Take 0.5 tablets (3.125 mg total) by mouth 2 (two) times daily with a meal. What  changed: how much to take   cefadroxil 500 MG capsule Commonly known as: DURICEF Take 1 capsule (500 mg total) by mouth 2 (two) times daily for 4 days.   cilostazol 50 MG tablet Commonly known as: PLETAL TAKE  (1)  TABLET TWICE A DAY.   insulin aspart 100 UNIT/ML injection Commonly known as: novoLOG Inject 0-15 Units into the skin 3 (three) times daily with meals.   omeprazole 20 MG capsule Commonly known as: PRILOSEC Take 20 mg by mouth daily.   pravastatin 80 MG tablet Commonly known as: PRAVACHOL Take 1 tablet (80 mg total) by mouth every morning.   vitamin C 500 MG tablet Commonly known as: ASCORBIC ACID Take 500 mg by mouth 2 (two) times daily.        Contact information for after-discharge care     Destination     [facility] .   Service: Skilled Nursing Contact information: [place] [address] [phone]                    Allergies  Allergen Reactions   Lisinopril     Other reaction(s):  Other (See Comments)  Decreased kidney functions   Red Blood Cells Other (See Comments)    Patient is a Jehovah's Witness   Penicillins Rash   Sulfonamide Derivatives Rash    Consultations:    Procedures/Studies: DG Chest 1 View  Result Date: 05/14/2021 CLINICAL DATA:  Status post fall this morning with low back pain and bilateral hip pain. EXAM: CHEST  1 VIEW COMPARISON:  Oct 29, 2019 FINDINGS: The heart size and mediastinal contours are stable. Both lungs are clear. The visualized skeletal structures are unremarkable. IMPRESSION: No active disease. Electronically Signed   By: Abelardo Diesel M.D.   On: 05/14/2021 12:09   DG Lumbar Spine Complete  Result Date: 05/14/2021 CLINICAL DATA:  Status post fall this morning with back pain. EXAM: LUMBAR SPINE - COMPLETE 4+ VIEW COMPARISON:  Oct 24, 2019 FINDINGS: There is no evidence of lumbar spine fracture. Alignment is normal. Degenerative joint changes of the spine  with narrowed joint space and osteophyte formation are noted. IMPRESSION: No acute fracture or dislocation identified. Degenerative joint changes of spine. Electronically Signed   By: Abelardo Diesel M.D.   On: 05/14/2021 12:08   DG CHEST PORT 1 VIEW  Result Date: 05/16/2021 CLINICAL DATA:  Generalized weakness and cough, history skin cancer, diabetes mellitus, hypertension EXAM: PORTABLE CHEST 1 VIEW COMPARISON:  Portable exam 1336 hours compared to 05/14/2021 FINDINGS: Normal heart size, mediastinal contours, and pulmonary vascularity. Atherosclerotic calcification aorta. Minimal subsegmental atelectasis LEFT base and RIGHT midlung. Lungs otherwise clear. No acute infiltrate, pleural effusion, or pneumothorax. Bones demineralized. IMPRESSION: Subsegmental atelectasis bilaterally. No acute infiltrate. Aortic Atherosclerosis (ICD10-I70.0). Electronically Signed   By: Lavonia Dana M.D.   On: 05/16/2021 14:11   VAS [place] ABI WITH/WO TBI  Result Date: 04/20/2021  LOWER EXTREMITY DOPPLER STUDY Patient Name:  PRIMROSE OLER  Date of Exam:   04/20/2021 Medical Rec #: [id number]      Accession #:    [id number] Date of Birth: Aug 21, 1933       Patient Gender: F Patient Age:   41 years Exam Location:  [facility] Procedure:      VAS [place] ABI WITH/WO TBI Referring Phys: Roderic Palau BERRY --------------------------------------------------------------------------------  Indications: Claudication, and peripheral artery disease. High Risk         Hypertension, hyperlipidemia, Diabetes, past history of Factors:          smoking. Other Factors: Patient complains of right hip pain when walking short                distances.  Vascular Interventions: History of left common iliac artery stent. Comparison Study: 11/2018-Right 0.53; Left 0.62 Performing Technologist: Wilkie Aye RVT  Examination Guidelines: A complete evaluation includes at minimum, Doppler waveform signals and systolic blood pressure reading at the level of bilateral brachial,  anterior tibial, and posterior tibial arteries, when vessel segments are accessible. Bilateral testing is considered an integral part of a complete examination. Photoelectric Plethysmograph (PPG) waveforms and toe systolic pressure readings are included as required and additional duplex testing as needed. Limited examinations for reoccurring indications may be performed as noted.  ABI Findings: +---------+------------------+-----+-------------------+--------+ Right    Rt Pressure (mmHg)IndexWaveform           Comment  +---------+------------------+-----+-------------------+--------+ Brachial 156                                                +---------+------------------+-----+-------------------+--------+  PTA      79                0.51 dampened monophasic         +---------+------------------+-----+-------------------+--------+ PERO     64                     dampened monophasic0.41     +---------+------------------+-----+-------------------+--------+ DP       59                0.38 dampened monophasic         +---------+------------------+-----+-------------------+--------+ Great Toe49                0.31 Abnormal                    +---------+------------------+-----+-------------------+--------+ +---------+------------------+-----+----------+-------+ Left     Lt Pressure (mmHg)IndexWaveform  Comment +---------+------------------+-----+----------+-------+ Brachial 127                                      +---------+------------------+-----+----------+-------+ PTA      103               0.66 monophasic        +---------+------------------+-----+----------+-------+ PERO     101                    monophasic0.65    +---------+------------------+-----+----------+-------+ DP       116               0.74 monophasic        +---------+------------------+-----+----------+-------+ Great Toe90                0.58 Abnormal           +---------+------------------+-----+----------+-------+ +-------+-----------+-----------+------------+------------+ ABI/TBIToday's ABIToday's TBIPrevious ABIPrevious TBI +-------+-----------+-----------+------------+------------+ Right  0.51       0.31       0.53        0.31         +-------+-----------+-----------+------------+------------+ Left   0.74       0.58       0.62        0.45         +-------+-----------+-----------+------------+------------+  Bilateral ABIs and TBIs appear essentially unchanged compared to prior study on 11/2018.  Summary: Right: Resting right ankle-brachial index indicates moderate right lower extremity arterial disease. The right toe-brachial index is abnormal. Left: Resting left ankle-brachial index indicates moderate left lower extremity arterial disease. The left toe-brachial index is abnormal.  *See table(s) above for measurements and observations.  Suggest follow up study in 12 months. Electronically signed by Jenkins Rouge MD on 04/20/2021 at 3:49:04 PM.    Final    DG Hips Bilat W or Wo Pelvis 3-4 Views  Result Date: 05/14/2021 CLINICAL DATA:  Status post fall this morning with bilateral hip pain. EXAM: DG HIP (WITH OR WITHOUT PELVIS) 3-4V BILAT COMPARISON:  March 17, 2018 FINDINGS: There is no evidence of hip fracture or dislocation. Narrow bilateral hip joint spaces are identified. Degenerative joint changes of the visualized spine are noted. IMPRESSION: No acute fracture or dislocation. Electronically Signed   By: Abelardo Diesel M.D.   On: 05/14/2021 12:07   VAS US CAROTID  Result Date: 04/20/2021 Carotid Arterial Duplex Study Patient Name:  CHELSA STOUT  Date of Exam:   04/20/2021 Medical Rec #: [id number]      Accession #:  [id number] Date of Birth: 1933/12/14       Patient Gender: F Patient Age:   49 years Exam Location:  [facility] Procedure:      VAS US CAROTID Referring Phys: Roderic Palau BERRY  --------------------------------------------------------------------------------  Indications:       Carotid artery disease and Patient denies any cerebrovascular                    symptoms. Risk Factors:      Hypertension, hyperlipidemia, Diabetes, past history of                    smoking, PAD. Comparison Study:  12/2014-RICA velocity 93/23 cm/s; LICA velocity 55/73 cm/s Performing Technologist: Wilkie Aye RVT  Examination Guidelines: A complete evaluation includes B-mode imaging, spectral Doppler, color Doppler, and power Doppler as needed of all accessible portions of each vessel. Bilateral testing is considered an integral part of a complete examination. Limited examinations for reoccurring indications may be performed as noted.  Right Carotid Findings: +----------+--------+--------+--------+------------------+--------+           PSV cm/sEDV cm/sStenosisPlaque DescriptionComments +----------+--------+--------+--------+------------------+--------+ CCA Prox  52      4                                          +----------+--------+--------+--------+------------------+--------+ CCA Distal54      7                                          +----------+--------+--------+--------+------------------+--------+ ICA Prox  48      12      1-39%   heterogenous               +----------+--------+--------+--------+------------------+--------+ ICA Mid   62      16                                         +----------+--------+--------+--------+------------------+--------+ ICA Distal63      17                                         +----------+--------+--------+--------+------------------+--------+ ECA       66      7                                          +----------+--------+--------+--------+------------------+--------+ +----------+--------+-------+---------+-------------------+           PSV cm/sEDV cmsDescribe Arm Pressure (mmHG)  +----------+--------+-------+---------+-------------------+ [id number]            [id number]                 +----------+--------+-------+---------+-------------------+ +---------+--------+--+--------+--+---------+ VertebralPSV cm/s63EDV cm/s15Antegrade +---------+--------+--+--------+--+---------+  Left Carotid Findings: +----------+--------+--------+--------+------------------+--------+           PSV cm/sEDV cm/sStenosisPlaque DescriptionComments +----------+--------+--------+--------+------------------+--------+ CCA Prox  70      9                                          +----------+--------+--------+--------+------------------+--------+  CCA Distal54      10                                         +----------+--------+--------+--------+------------------+--------+ ICA Prox  44      14      1-39%   heterogenous               +----------+--------+--------+--------+------------------+--------+ ICA Mid   67      21                                         +----------+--------+--------+--------+------------------+--------+ ICA Distal70      19                                         +----------+--------+--------+--------+------------------+--------+ ECA       57      4                                          +----------+--------+--------+--------+------------------+--------+ +----------+--------+--------+----------------+-------------------+           PSV cm/sEDV cm/sDescribe        Arm Pressure (mmHG) +----------+--------+--------+----------------+-------------------+ [id number]             Multiphasic, [id number]                 +----------+--------+--------+----------------+-------------------+ +---------+--------+--+--------+--+---------+ VertebralPSV cm/s53EDV cm/s16Antegrade +---------+--------+--+--------+--+---------+ Left BP is 29 mmHG lower than the left.  Summary: Right Carotid: Velocities in the right ICA are consistent  with a 1-39% stenosis. Left Carotid: Velocities in the left ICA are consistent with a 1-39% stenosis. Vertebrals:  Bilateral vertebral arteries demonstrate antegrade flow. Subclavians: Right subclavian artery flow was disturbed. Normal flow              hemodynamics were seen in the left subclavian artery. *See table(s) above for measurements and observations.  Electronically signed by Jenkins Rouge MD on 04/20/2021 at 3:49:42 PM.    Final    VAS US AORTA/IVC/ILIACS  Result Date: 04/20/2021 ABDOMINAL AORTA STUDY Patient Name:  DENISIA HARPOLE  Date of Exam:   04/20/2021 Medical Rec #: [id number]      Accession #:    [id number] Date of Birth: 1933-07-05       Patient Gender: F Patient Age:   80 years Exam Location:  [facility] Procedure:      VAS US AORTA/IVC/ILIACS Referring Phys: Quay Burow --------------------------------------------------------------------------------  Indications: Follow up exam for known AAA. Left common iliac artery stent Risk Factors: Hypertension, hyperlipidemia, Diabetes, past history of smoking. Other Factors: Patient complains of right hip pain when walking short                distances. Vascular Interventions: Left common iliac artery stent. Limitations: Air/bowel gas.  Comparison Study: 11/2018-Known occlusion right common iliac artery; Left common                   iliac artery velocity 288 cm/s; AAA 3.6 cm Performing Technologist: Wilkie Aye RVT  Examination Guidelines: A complete evaluation includes B-mode imaging, spectral Doppler, color Doppler, and power Doppler as needed of all  accessible portions of each vessel. Bilateral testing is considered an integral part of a complete examination. Limited examinations for reoccurring indications may be performed as noted.  Abdominal Aorta Findings: +-------------+-------+----------+----------+----------+--------+--------+ Location     AP (cm)Trans (cm)PSV (cm/s)Waveform  ThrombusComments  +-------------+-------+----------+----------+----------+--------+--------+ Proximal     1.90   2.00      56                                   +-------------+-------+----------+----------+----------+--------+--------+ Mid          3.70   3.70      70                  Present fusiform +-------------+-------+----------+----------+----------+--------+--------+ Distal       3.10   3.30      115                 Present fusiform +-------------+-------+----------+----------+----------+--------+--------+ RT CIA Prox                   0         Occluded                   +-------------+-------+----------+----------+----------+--------+--------+ RT CIA Mid                    0         Occluded                   +-------------+-------+----------+----------+----------+--------+--------+ RT CIA Distal                 0         Occluded                   +-------------+-------+----------+----------+----------+--------+--------+ RT EIA Prox                   0         Occluded                   +-------------+-------+----------+----------+----------+--------+--------+ RT EIA Mid                    0         Occluded                   +-------------+-------+----------+----------+----------+--------+--------+ RT EIA Distal                 83        monophasic                 +-------------+-------+----------+----------+----------+--------+--------+ LT CIA Prox                   145       monophasic                 +-------------+-------+----------+----------+----------+--------+--------+ LT CIA Mid                    191       monophasic                 +-------------+-------+----------+----------+----------+--------+--------+ LT CIA Distal                 184       monophasic                 +-------------+-------+----------+----------+----------+--------+--------+  LT EIA Prox                   160       monophasic                  +-------------+-------+----------+----------+----------+--------+--------+ LT EIA Mid                    184       monophasic                 +-------------+-------+----------+----------+----------+--------+--------+ LT EIA Distal                 136       monophasic                 +-------------+-------+----------+----------+----------+--------+--------+ Unable to visualize stent struts in the left CIA Unbable to duplicate elevated velocity in the left CIA. IVC/Iliac Findings: +--------+------+   IVC   Patent +--------+------+ IVC Proxpatent +--------+------+   Summary: Abdominal Aorta: There is evidence of abnormal dilatation of the mid and distal Abdominal aorta. The largest aortic measurement is 3.7 cm. The largest aortic diameter remains essentially unchanged compared to prior exam. Previous diameter measurement was  obtained on 11/2018. Stenosis: +--------------------+-------------+ Location            Stenosis      +--------------------+-------------+ Right Common Iliac  occluded      +--------------------+-------------+ Left Common Iliac   <50% stenosis +--------------------+-------------+ Right External Iliacoccluded      +--------------------+-------------+ Left External Iliac <50% stenosis +--------------------+-------------+   *See table(s) above for measurements and observations. Suggest follow up study in 12 months.  Electronically signed by Jenkins Rouge MD on 04/20/2021 at 3:58:57 PM.    Final       Subjective: No new complaints  Discharge Exam: Vitals:   05/17/21 0446 05/17/21 1456 05/17/21 1933 05/18/21 0356  BP: (!) 147/82 140/88 (!) 150/82 (!) 155/71  Pulse: 91 70 91 82  Resp: 18  18 14   Temp: 98.4 F (36.9 C) 98.1 F (36.7 C) 97.9 F (36.6 C) 97.6 F (36.4 C)  TempSrc: Oral Oral Oral Oral  SpO2: 91% 90% 91% 93%  Weight:      Height:        General: Pt is alert, awake, not in acute distress Cardiovascular: RRR, S1/S2 +, no rubs, no  gallops Respiratory: CTA bilaterally, no wheezing, no rhonchi Abdominal: Soft, NT, ND, bowel sounds + Extremities: no edema, no cyanosis    The results of significant diagnostics from this hospitalization (including imaging, microbiology, ancillary and laboratory) are listed below for reference.     Microbiology: Recent Results (from the past 240 hour(s))  Resp Panel by RT-PCR (Flu A&B, Covid) Nasopharyngeal Swab     Status: None   Collection Time: 05/14/21 11:17 AM   Specimen: Nasopharyngeal Swab; Nasopharyngeal(NP) swabs in vial transport medium  Result Value Ref Range Status   SARS Coronavirus 2 by RT PCR NEGATIVE NEGATIVE Final    Comment: (NOTE) SARS-CoV-2 target nucleic acids are NOT DETECTED.  The SARS-CoV-2 RNA is generally detectable in upper respiratory specimens during the acute phase of infection. The lowest concentration of SARS-CoV-2 viral copies this assay can detect is 138 copies/mL. A negative result does not preclude SARS-Cov-2 infection and should not be used as the sole basis for treatment or other patient management decisions. A negative result may occur with  improper specimen collection/handling, submission of specimen other than nasopharyngeal swab, presence of viral mutation(s)  within the areas targeted by this assay, and inadequate number of viral copies(<138 copies/mL). A negative result must be combined with clinical observations, patient history, and epidemiological information. The expected result is Negative.  Fact Sheet for Patients:  EntrepreneurPulse.com.au  Fact Sheet for Healthcare Providers:  IncredibleEmployment.be  This test is no t yet approved or cleared by the [place] FDA and  has been authorized for detection and/or diagnosis of SARS-CoV-2 by FDA under an Emergency Use Authorization (EUA). This EUA will remain  in effect (meaning this test can be used) for the duration of the COVID-19  declaration under Section 564(b)(1) of the Act, 21 U.S.C.section 360bbb-3(b)(1), unless the authorization is terminated  or revoked sooner.       Influenza A by PCR NEGATIVE NEGATIVE Final   Influenza B by PCR NEGATIVE NEGATIVE Final    Comment: (NOTE) The Xpert Xpress SARS-CoV-2/FLU/RSV plus assay is intended as an aid in the diagnosis of influenza from Nasopharyngeal swab specimens and should not be used as a sole basis for treatment. Nasal washings and aspirates are unacceptable for Xpert Xpress SARS-CoV-2/FLU/RSV testing.  Fact Sheet for Patients: EntrepreneurPulse.com.au  Fact Sheet for Healthcare Providers: IncredibleEmployment.be  This test is not yet approved or cleared by the [place] FDA and has been authorized for detection and/or diagnosis of SARS-CoV-2 by FDA under an Emergency Use Authorization (EUA). This EUA will remain in effect (meaning this test can be used) for the duration of the COVID-19 declaration under Section 564(b)(1) of the Act, 21 U.S.C. section 360bbb-3(b)(1), unless the authorization is terminated or revoked.  Performed at [facility], [address], [place], [place] [address]   Urine Culture     Status: Abnormal   Collection Time: 05/14/21  2:15 PM   Specimen: Urine, Clean Catch  Result Value Ref Range Status   Specimen Description   Final    URINE, CLEAN CATCH Performed at [facility], [address] [place], [place] [address]    Special Requests   Final    NONE Performed at [facility], [address], [place], [place] [address]    Culture (A)  Final    80,000 COLONIES/mL KLEBSIELLA PNEUMONIAE 50,000 COLONIES/mL ESCHERICHIA COLI    Report Status 05/17/2021 FINAL  Final   Organism ID, Bacteria KLEBSIELLA PNEUMONIAE (A)  Final   Organism ID, Bacteria ESCHERICHIA COLI (A)  Final      Susceptibility   Escherichia coli - MIC*    AMPICILLIN 4 SENSITIVE Sensitive     CEFAZOLIN <=4 SENSITIVE  Sensitive     CEFEPIME <=0.12 SENSITIVE Sensitive     CEFTRIAXONE <=0.25 SENSITIVE Sensitive     CIPROFLOXACIN <=0.25 SENSITIVE Sensitive     GENTAMICIN <=1 SENSITIVE Sensitive     IMIPENEM <=0.25 SENSITIVE Sensitive     NITROFURANTOIN <=16 SENSITIVE Sensitive     TRIMETH/SULFA <=20 SENSITIVE Sensitive     AMPICILLIN/SULBACTAM <=2 SENSITIVE Sensitive     PIP/TAZO <=4 SENSITIVE Sensitive     * 50,000 COLONIES/mL ESCHERICHIA COLI   Klebsiella pneumoniae - MIC*    AMPICILLIN >=32 RESISTANT Resistant     CEFAZOLIN <=4 SENSITIVE Sensitive     CEFEPIME <=0.12 SENSITIVE Sensitive     CEFTRIAXONE <=0.25 SENSITIVE Sensitive     CIPROFLOXACIN <=0.25 SENSITIVE Sensitive     GENTAMICIN <=1 SENSITIVE Sensitive     IMIPENEM <=0.25 SENSITIVE Sensitive     NITROFURANTOIN 32 SENSITIVE Sensitive     TRIMETH/SULFA <=20 SENSITIVE Sensitive     AMPICILLIN/SULBACTAM 8 SENSITIVE  Sensitive     PIP/TAZO <=4 SENSITIVE Sensitive     * 80,000 COLONIES/mL KLEBSIELLA PNEUMONIAE  Respiratory (~20 pathogens) panel by PCR     Status: None   Collection Time: 05/14/21  4:04 PM   Specimen: Nasopharyngeal Swab; Respiratory  Result Value Ref Range Status   Adenovirus NOT DETECTED NOT DETECTED Final   Coronavirus 229E NOT DETECTED NOT DETECTED Final    Comment: (NOTE) The Coronavirus on the Respiratory Panel, DOES NOT test for the novel  Coronavirus (2019 nCoV)    Coronavirus HKU1 NOT DETECTED NOT DETECTED Final   Coronavirus NL63 NOT DETECTED NOT DETECTED Final   Coronavirus OC43 NOT DETECTED NOT DETECTED Final   Metapneumovirus NOT DETECTED NOT DETECTED Final   Rhinovirus / Enterovirus NOT DETECTED NOT DETECTED Final   Influenza A NOT DETECTED NOT DETECTED Final   Influenza B NOT DETECTED NOT DETECTED Final   Parainfluenza Virus 1 NOT DETECTED NOT DETECTED Final   Parainfluenza Virus 2 NOT DETECTED NOT DETECTED Final   Parainfluenza Virus 3 NOT DETECTED NOT DETECTED Final   Parainfluenza Virus 4 NOT DETECTED  NOT DETECTED Final   Respiratory Syncytial Virus NOT DETECTED NOT DETECTED Final   Bordetella pertussis NOT DETECTED NOT DETECTED Final   Bordetella Parapertussis NOT DETECTED NOT DETECTED Final   Chlamydophila pneumoniae NOT DETECTED NOT DETECTED Final   Mycoplasma pneumoniae NOT DETECTED NOT DETECTED Final    Comment: Performed at [facility], [place]. [address] [place], [place] [address]  Culture, blood (Routine X 2) w Reflex to ID Panel     Status: Abnormal   Collection Time: 05/14/21  5:08 PM   Specimen: BLOOD RIGHT ARM  Result Value Ref Range Status   Specimen Description   Final    BLOOD RIGHT ARM Performed at [facility], [address] [place], [place] [address]    Special Requests   Final    Blood Culture adequate volume BOTTLES DRAWN AEROBIC AND ANAEROBIC Performed at [facility], [address] [place], [place] [address]    Culture  Setup Time   Final    GRAM NEGATIVE RODS IN BOTH AEROBIC AND ANAEROBIC BOTTLES Gram Stain Report Called to,Read Back By and Verified With: BROWN,[id number]  BY MATTHEWS, B 11.25.22 CRITICAL RESULT CALLED TO, READ BACK BY AND VERIFIED WITH: G,COFFEE PHARMD @0952  05/15/21 EB Performed at [place] [address] [place], [place] [address]    Culture ESCHERICHIA COLI (A)  Final   Report Status 05/17/2021 FINAL  Final   Organism ID, Bacteria ESCHERICHIA COLI  Final      Susceptibility   Escherichia coli - MIC*    AMPICILLIN 4 SENSITIVE Sensitive     CEFAZOLIN <=4 SENSITIVE Sensitive     CEFEPIME <=0.12 SENSITIVE Sensitive     CEFTAZIDIME <=1 SENSITIVE Sensitive     CEFTRIAXONE <=0.25 SENSITIVE Sensitive     CIPROFLOXACIN <=0.25 SENSITIVE Sensitive     GENTAMICIN <=1 SENSITIVE Sensitive     IMIPENEM <=0.25 SENSITIVE Sensitive     TRIMETH/SULFA <=20 SENSITIVE Sensitive     AMPICILLIN/SULBACTAM <=2 SENSITIVE Sensitive     PIP/TAZO <=4 SENSITIVE Sensitive     * ESCHERICHIA COLI  Culture, blood (Routine X 2) w Reflex to ID Panel      Status: Abnormal   Collection Time: 05/14/21  5:08 PM   Specimen: BLOOD LEFT HAND  Result Value Ref Range Status   Specimen Description   Final    BLOOD LEFT HAND Performed at [facility], [address] [place], [place] [address]    Special Requests   Final    Blood Culture adequate volume BOTTLES DRAWN AEROBIC AND ANAEROBIC Performed at [facility], [address], [place], [place] [address]    Culture  Setup Time   Final    GRAM NEGATIVE RODS IN BOTH AEROBIC AND ANAEROBIC BOTTLES Gram Stain Report Called to,Read Back By and Verified With: BROWN,[id number]  BY MATTHEWS, B 11.25.22 CRITICAL VALUE NOTED.  VALUE IS CONSISTENT WITH PREVIOUSLY REPORTED AND CALLED VALUE.    Culture (A)  Final    ESCHERICHIA COLI SUSCEPTIBILITIES PERFORMED ON PREVIOUS CULTURE WITHIN THE LAST 5 DAYS. Performed at [facility], [place] [address], [place], [place] [address]    Report Status 05/17/2021 FINAL  Final  Blood Culture ID Panel (Reflexed)     Status: Abnormal   Collection Time: 05/14/21  5:08 PM  Result Value Ref Range Status   Enterococcus faecalis NOT DETECTED NOT DETECTED Final   Enterococcus Faecium NOT DETECTED NOT DETECTED Final   Listeria monocytogenes NOT DETECTED NOT DETECTED Final   Staphylococcus species NOT DETECTED NOT DETECTED Final   Staphylococcus aureus (BCID) NOT DETECTED NOT DETECTED Final   Staphylococcus epidermidis NOT DETECTED NOT DETECTED Final   Staphylococcus lugdunensis NOT DETECTED NOT DETECTED Final   Streptococcus species NOT DETECTED NOT DETECTED Final   Streptococcus agalactiae NOT DETECTED NOT DETECTED Final   Streptococcus pneumoniae NOT DETECTED NOT DETECTED Final   Streptococcus pyogenes NOT DETECTED NOT DETECTED Final   A.calcoaceticus-baumannii NOT DETECTED NOT DETECTED Final   Bacteroides fragilis NOT DETECTED NOT DETECTED Final   Enterobacterales DETECTED (A) NOT DETECTED Final    Comment: Enterobacterales represent a large order of gram negative  bacteria, not a single organism. CRITICAL RESULT CALLED TO, READ BACK BY AND VERIFIED WITH: G,COFFEE PHARMD @0952  05/15/21 EB    Enterobacter cloacae complex NOT DETECTED NOT DETECTED Final   Escherichia coli DETECTED (A) NOT DETECTED Final    Comment: CRITICAL RESULT CALLED TO, READ BACK BY AND VERIFIED WITH: G,COFFEE PHARMD @0952  05/15/21 EB    Klebsiella aerogenes NOT DETECTED NOT DETECTED Final   Klebsiella oxytoca NOT DETECTED NOT DETECTED Final   Klebsiella pneumoniae NOT DETECTED NOT DETECTED Final   Proteus species NOT DETECTED NOT DETECTED Final   Salmonella species NOT DETECTED NOT DETECTED Final   Serratia marcescens NOT DETECTED NOT DETECTED Final   Haemophilus influenzae NOT DETECTED NOT DETECTED Final   Neisseria meningitidis NOT DETECTED NOT DETECTED Final   Pseudomonas aeruginosa NOT DETECTED NOT DETECTED Final   Stenotrophomonas maltophilia NOT DETECTED NOT DETECTED Final   Candida albicans NOT DETECTED NOT DETECTED Final   Candida auris NOT DETECTED NOT DETECTED Final   Candida glabrata NOT DETECTED NOT DETECTED Final   Candida krusei NOT DETECTED NOT DETECTED Final   Candida parapsilosis NOT DETECTED NOT DETECTED Final   Candida tropicalis NOT DETECTED NOT DETECTED Final   Cryptococcus neoformans/gattii NOT DETECTED NOT DETECTED Final   CTX-M ESBL NOT DETECTED NOT DETECTED Final   Carbapenem resistance IMP NOT DETECTED NOT DETECTED Final   Carbapenem resistance KPC NOT DETECTED NOT DETECTED Final   Carbapenem resistance NDM NOT DETECTED NOT DETECTED Final   Carbapenem resist OXA 48 LIKE NOT DETECTED NOT DETECTED Final   Carbapenem resistance VIM NOT DETECTED NOT DETECTED Final    Comment: Performed at [facility], [address] [place], [place] [address]  C Difficile Quick Screen w PCR reflex  Status: Abnormal   Collection Time: 05/15/21 11:08 AM   Specimen: STOOL  Result Value Ref Range Status   C Diff antigen POSITIVE (A) NEGATIVE Final   C Diff  toxin NEGATIVE NEGATIVE Final   C Diff interpretation Results are indeterminate. See PCR results.  Final    Comment: Performed at [facility], [address] [place], [place] [address]  C. Diff by PCR, Reflexed     Status: Abnormal   Collection Time: 05/15/21 11:08 AM  Result Value Ref Range Status   Toxigenic C. Difficile by PCR POSITIVE (A) NEGATIVE Final    Comment: Positive for toxigenic C. difficile with little to no toxin production. Only treat if clinical presentation suggests symptomatic illness. Performed at [facility], [place] [address] [place] [address], [place], [place] [address]      Labs: BNP (last 3 results) Recent Labs    11/14/20 0451  BNP 466.5*   Basic Metabolic Panel: Recent Labs  Lab 05/14/21 1117 05/15/21 0507 05/16/21 0459 05/17/21 0258 05/18/21 0322  NA 137 137 138 136 138  K 3.7 3.5 3.9 3.7 3.9  CL 103 107 109 105 104  CO2 24 19* 21* 23 26  GLUCOSE 165* 102* 83 112* 122*  BUN 45* 36* 29* 21 16  CREATININE 1.81* 1.16* 1.01* 0.85 0.83  CALCIUM 8.5* 7.5* 7.8* 8.4* 8.7*  MG  --  1.4* 1.4* 1.3* 2.0   Liver Function Tests: Recent Labs  Lab 05/14/21 1117  AST 19  ALT 14  ALKPHOS 68  BILITOT 0.9  PROT 6.3*  ALBUMIN 3.4*   Recent Labs  Lab 05/14/21 1708  LIPASE 24   No results for input(s): AMMONIA in the last 168 hours. CBC: Recent Labs  Lab 05/14/21 1117 05/15/21 0507 05/16/21 0459 05/17/21 0258  WBC 13.7* 10.3 8.3 7.5  NEUTROABS 11.6*  --   --   --   HGB 12.8 11.1* 12.2 12.4  HCT 38.0 34.3* 38.5 37.1  MCV 93.6 94.0 94.8 92.1  PLT 140* 128* 120* 143*   Cardiac Enzymes: Recent Labs  Lab 05/14/21 1708  CKTOTAL 188   BNP: Invalid input(s): POCBNP CBG: Recent Labs  Lab 05/17/21 0723 05/17/21 1051 05/17/21 1633 05/17/21 2107 05/18/21 0754  GLUCAP 111* 106* 138* 130* 114*   D-Dimer No results for input(s): DDIMER in the last 72 hours. Hgb A1c No results for input(s): HGBA1C in the last 72 hours. Lipid Profile No results for  input(s): CHOL, HDL, LDLCALC, TRIG, CHOLHDL, LDLDIRECT in the last 72 hours. Thyroid function studies No results for input(s): TSH, T4TOTAL, T3FREE, THYROIDAB in the last 72 hours.  Invalid input(s): FREET3 Anemia work up No results for input(s): VITAMINB12, FOLATE, FERRITIN, TIBC, IRON, RETICCTPCT in the last 72 hours. Urinalysis    Component Value Date/Time   COLORURINE YELLOW 05/14/2021 1415   APPEARANCEUR CLOUDY (A) 05/14/2021 1415   LABSPEC 1.015 05/14/2021 1415   PHURINE 5.5 05/14/2021 1415   GLUCOSEU NEGATIVE 05/14/2021 1415   HGBUR SMALL (A) 05/14/2021 1415   BILIRUBINUR NEGATIVE 05/14/2021 1415   KETONESUR NEGATIVE 05/14/2021 1415   PROTEINUR TRACE (A) 05/14/2021 1415   UROBILINOGEN 0.2 06/22/2014 1305   NITRITE POSITIVE (A) 05/14/2021 1415   LEUKOCYTESUR MODERATE (A) 05/14/2021 1415   Sepsis Labs Invalid input(s): PROCALCITONIN,  WBC,  LACTICIDVEN Microbiology Recent Results (from the past 240 hour(s))  Resp Panel by RT-PCR (Flu A&B, Covid) Nasopharyngeal Swab     Status: None   Collection Time: 05/14/21 11:17 AM   Specimen: Nasopharyngeal  Swab; Nasopharyngeal(NP) swabs in vial transport medium  Result Value Ref Range Status   SARS Coronavirus 2 by RT PCR NEGATIVE NEGATIVE Final    Comment: (NOTE) SARS-CoV-2 target nucleic acids are NOT DETECTED.  The SARS-CoV-2 RNA is generally detectable in upper respiratory specimens during the acute phase of infection. The lowest concentration of SARS-CoV-2 viral copies this assay can detect is 138 copies/mL. A negative result does not preclude SARS-Cov-2 infection and should not be used as the sole basis for treatment or other patient management decisions. A negative result may occur with  improper specimen collection/handling, submission of specimen other than nasopharyngeal swab, presence of viral mutation(s) within the areas targeted by this assay, and inadequate number of viral copies(<138 copies/mL). A negative result  must be combined with clinical observations, patient history, and epidemiological information. The expected result is Negative.  Fact Sheet for Patients:  EntrepreneurPulse.com.au  Fact Sheet for Healthcare Providers:  IncredibleEmployment.be  This test is no t yet approved or cleared by the [place] FDA and  has been authorized for detection and/or diagnosis of SARS-CoV-2 by FDA under an Emergency Use Authorization (EUA). This EUA will remain  in effect (meaning this test can be used) for the duration of the COVID-19 declaration under Section 564(b)(1) of the Act, 21 U.S.C.section 360bbb-3(b)(1), unless the authorization is terminated  or revoked sooner.       Influenza A by PCR NEGATIVE NEGATIVE Final   Influenza B by PCR NEGATIVE NEGATIVE Final    Comment: (NOTE) The Xpert Xpress SARS-CoV-2/FLU/RSV plus assay is intended as an aid in the diagnosis of influenza from Nasopharyngeal swab specimens and should not be used as a sole basis for treatment. Nasal washings and aspirates are unacceptable for Xpert Xpress SARS-CoV-2/FLU/RSV testing.  Fact Sheet for Patients: EntrepreneurPulse.com.au  Fact Sheet for Healthcare Providers: IncredibleEmployment.be  This test is not yet approved or cleared by the [place] FDA and has been authorized for detection and/or diagnosis of SARS-CoV-2 by FDA under an Emergency Use Authorization (EUA). This EUA will remain in effect (meaning this test can be used) for the duration of the COVID-19 declaration under Section 564(b)(1) of the Act, 21 U.S.C. section 360bbb-3(b)(1), unless the authorization is terminated or revoked.  Performed at [facility], [address], [place], [place] [address]   Urine Culture     Status: Abnormal   Collection Time: 05/14/21  2:15 PM   Specimen: Urine, Clean Catch  Result Value Ref Range Status   Specimen Description   Final     URINE, CLEAN CATCH Performed at [facility], [address], [place], [place]  [address]    Special Requests   Final    NONE Performed at [facility], [address], [place],  [address]    Culture (A)  Final    80,000 COLONIES/mL KLEBSIELLA PNEUMONIAE 50,000 COLONIES/mL ESCHERICHIA COLI    Report Status 05/17/2021 FINAL  Final   Organism ID, Bacteria KLEBSIELLA PNEUMONIAE (A)  Final   Organism ID, Bacteria ESCHERICHIA COLI (A)  Final      Susceptibility   Escherichia coli - MIC*    AMPICILLIN 4 SENSITIVE Sensitive     CEFAZOLIN <=4 SENSITIVE Sensitive     CEFEPIME <=0.12 SENSITIVE Sensitive     CEFTRIAXONE <=0.25 SENSITIVE Sensitive     CIPROFLOXACIN <=0.25 SENSITIVE Sensitive     GENTAMICIN <=1 SENSITIVE Sensitive     IMIPENEM <=0.25 SENSITIVE Sensitive     NITROFURANTOIN <=16 SENSITIVE Sensitive     TRIMETH/SULFA <=  20 SENSITIVE Sensitive     AMPICILLIN/SULBACTAM <=2 SENSITIVE Sensitive     PIP/TAZO <=4 SENSITIVE Sensitive     * 50,000 COLONIES/mL ESCHERICHIA COLI   Klebsiella pneumoniae - MIC*    AMPICILLIN >=32 RESISTANT Resistant     CEFAZOLIN <=4 SENSITIVE Sensitive     CEFEPIME <=0.12 SENSITIVE Sensitive     CEFTRIAXONE <=0.25 SENSITIVE Sensitive     CIPROFLOXACIN <=0.25 SENSITIVE Sensitive     GENTAMICIN <=1 SENSITIVE Sensitive     IMIPENEM <=0.25 SENSITIVE Sensitive     NITROFURANTOIN 32 SENSITIVE Sensitive     TRIMETH/SULFA <=20 SENSITIVE Sensitive     AMPICILLIN/SULBACTAM 8 SENSITIVE Sensitive     PIP/TAZO <=4 SENSITIVE Sensitive     * 80,000 COLONIES/mL KLEBSIELLA PNEUMONIAE  Respiratory (~20 pathogens) panel by PCR     Status: None   Collection Time: 05/14/21  4:04 PM   Specimen: Nasopharyngeal Swab; Respiratory  Result Value Ref Range Status   Adenovirus NOT DETECTED NOT DETECTED Final   Coronavirus 229E NOT DETECTED NOT DETECTED Final    Comment: (NOTE) The Coronavirus on the Respiratory Panel, DOES NOT test for the novel  Coronavirus (2019  nCoV)    Coronavirus HKU1 NOT DETECTED NOT DETECTED Final   Coronavirus NL63 NOT DETECTED NOT DETECTED Final   Coronavirus OC43 NOT DETECTED NOT DETECTED Final   Metapneumovirus NOT DETECTED NOT DETECTED Final   Rhinovirus / Enterovirus NOT DETECTED NOT DETECTED Final   Influenza A NOT DETECTED NOT DETECTED Final   Influenza B NOT DETECTED NOT DETECTED Final   Parainfluenza Virus 1 NOT DETECTED NOT DETECTED Final   Parainfluenza Virus 2 NOT DETECTED NOT DETECTED Final   Parainfluenza Virus 3 NOT DETECTED NOT DETECTED Final   Parainfluenza Virus 4 NOT DETECTED NOT DETECTED Final   Respiratory Syncytial Virus NOT DETECTED NOT DETECTED Final   Bordetella pertussis NOT DETECTED NOT DETECTED Final   Bordetella Parapertussis NOT DETECTED NOT DETECTED Final   Chlamydophila pneumoniae NOT DETECTED NOT DETECTED Final   Mycoplasma pneumoniae NOT DETECTED NOT DETECTED Final    Comment: Performed at [facility], [place]. [address], [place], [place] [address]  Culture, blood (Routine X 2) w Reflex to ID Panel     Status: Abnormal   Collection Time: 05/14/21  5:08 PM   Specimen: BLOOD RIGHT ARM  Result Value Ref Range Status   Specimen Description   Final    BLOOD RIGHT ARM Performed at [facility], [address], [place], [place] [address]    Special Requests   Final    Blood Culture adequate volume BOTTLES DRAWN AEROBIC AND ANAEROBIC Performed at [facility], [address] [place], [place] [address]    Culture  Setup Time   Final    GRAM NEGATIVE RODS IN BOTH AEROBIC AND ANAEROBIC BOTTLES Gram Stain Report Called to,Read Back By and Verified With: BROWN,J@[id number]  BY MATTHEWS, B 11.25.22 CRITICAL RESULT CALLED TO, READ BACK BY AND VERIFIED WITH: G,COFFEE PHARMD @0952  05/15/21 EB Performed at [place] [address] [place], [place] [address]    Culture ESCHERICHIA COLI (A)  Final   Report Status 05/17/2021 FINAL  Final   Organism ID, Bacteria ESCHERICHIA COLI  Final       Susceptibility   Escherichia coli - MIC*    AMPICILLIN 4 SENSITIVE Sensitive     CEFAZOLIN <=4 SENSITIVE Sensitive     CEFEPIME <=0.12 SENSITIVE Sensitive     CEFTAZIDIME <=1 SENSITIVE Sensitive  CEFTRIAXONE <=0.25 SENSITIVE Sensitive     CIPROFLOXACIN <=0.25 SENSITIVE Sensitive     GENTAMICIN <=1 SENSITIVE Sensitive     IMIPENEM <=0.25 SENSITIVE Sensitive     TRIMETH/SULFA <=20 SENSITIVE Sensitive     AMPICILLIN/SULBACTAM <=2 SENSITIVE Sensitive     PIP/TAZO <=4 SENSITIVE Sensitive     * ESCHERICHIA COLI  Culture, blood (Routine X 2) w Reflex to ID Panel     Status: Abnormal   Collection Time: 05/14/21  5:08 PM   Specimen: BLOOD LEFT HAND  Result Value Ref Range Status   Specimen Description   Final    BLOOD LEFT HAND Performed at [facility], [address] [place], [place] [address]    Special Requests   Final    Blood Culture adequate volume BOTTLES DRAWN AEROBIC AND ANAEROBIC Performed at [facility], [address] [place], [place] [address]    Culture  Setup Time   Final    GRAM NEGATIVE RODS IN BOTH AEROBIC AND ANAEROBIC BOTTLES Gram Stain Report Called to,Read Back By and Verified With: BROWN,J@0651  BY MATTHEWS, B 11.25.22 CRITICAL VALUE NOTED.  VALUE IS CONSISTENT WITH PREVIOUSLY REPORTED AND CALLED VALUE.    Culture (A)  Final    ESCHERICHIA COLI SUSCEPTIBILITIES PERFORMED ON PREVIOUS CULTURE WITHIN THE LAST 5 DAYS. Performed at [facility], [place] [address], [place], [place] [address]    Report Status 05/17/2021 FINAL  Final  Blood Culture ID Panel (Reflexed)     Status: Abnormal   Collection Time: 05/14/21  5:08 PM  Result Value Ref Range Status   Enterococcus faecalis NOT DETECTED NOT DETECTED Final   Enterococcus Faecium NOT DETECTED NOT DETECTED Final   Listeria monocytogenes NOT DETECTED NOT DETECTED Final   Staphylococcus species NOT DETECTED NOT DETECTED Final   Staphylococcus aureus (BCID) NOT DETECTED NOT DETECTED Final   Staphylococcus  epidermidis NOT DETECTED NOT DETECTED Final   Staphylococcus lugdunensis NOT DETECTED NOT DETECTED Final   Streptococcus species NOT DETECTED NOT DETECTED Final   Streptococcus agalactiae NOT DETECTED NOT DETECTED Final   Streptococcus pneumoniae NOT DETECTED NOT DETECTED Final   Streptococcus pyogenes NOT DETECTED NOT DETECTED Final   A.calcoaceticus-baumannii NOT DETECTED NOT DETECTED Final   Bacteroides fragilis NOT DETECTED NOT DETECTED Final   Enterobacterales DETECTED (A) NOT DETECTED Final    Comment: Enterobacterales represent a large order of gram negative bacteria, not a single organism. CRITICAL RESULT CALLED TO, READ BACK BY AND VERIFIED WITH: G,COFFEE PHARMD @0952  05/15/21 EB    Enterobacter cloacae complex NOT DETECTED NOT DETECTED Final   Escherichia coli DETECTED (A) NOT DETECTED Final    Comment: CRITICAL RESULT CALLED TO, READ BACK BY AND VERIFIED WITH: G,COFFEE PHARMD @0952  05/15/21 EB    Klebsiella aerogenes NOT DETECTED NOT DETECTED Final   Klebsiella oxytoca NOT DETECTED NOT DETECTED Final   Klebsiella pneumoniae NOT DETECTED NOT DETECTED Final   Proteus species NOT DETECTED NOT DETECTED Final   Salmonella species NOT DETECTED NOT DETECTED Final   Serratia marcescens NOT DETECTED NOT DETECTED Final   Haemophilus influenzae NOT DETECTED NOT DETECTED Final   Neisseria meningitidis NOT DETECTED NOT DETECTED Final   Pseudomonas aeruginosa NOT DETECTED NOT DETECTED Final   Stenotrophomonas maltophilia NOT DETECTED NOT DETECTED Final   Candida albicans NOT DETECTED NOT DETECTED Final   Candida auris NOT DETECTED NOT DETECTED Final   Candida glabrata NOT DETECTED NOT DETECTED Final   Candida krusei NOT DETECTED NOT DETECTED Final   Candida parapsilosis NOT  DETECTED NOT DETECTED Final   Candida tropicalis NOT DETECTED NOT DETECTED Final   Cryptococcus neoformans/gattii NOT DETECTED NOT DETECTED Final   CTX-M ESBL NOT DETECTED NOT DETECTED Final   Carbapenem  resistance IMP NOT DETECTED NOT DETECTED Final   Carbapenem resistance KPC NOT DETECTED NOT DETECTED Final   Carbapenem resistance NDM NOT DETECTED NOT DETECTED Final   Carbapenem resist OXA 48 LIKE NOT DETECTED NOT DETECTED Final   Carbapenem resistance VIM NOT DETECTED NOT DETECTED Final    Comment: Performed at [facility], [place] [address], [place], [place] [address]  C Difficile Quick Screen w PCR reflex     Status: Abnormal   Collection Time: 05/15/21 11:08 AM   Specimen: STOOL  Result Value Ref Range Status   C Diff antigen POSITIVE (A) NEGATIVE Final   C Diff toxin NEGATIVE NEGATIVE Final   C Diff interpretation Results are indeterminate. See PCR results.  Final    Comment: Performed at [facility], [address], [place], [place] [address]  C. Diff by PCR, Reflexed     Status: Abnormal   Collection Time: 05/15/21 11:08 AM  Result Value Ref Range Status   Toxigenic C. Difficile by PCR POSITIVE (A) NEGATIVE Final    Comment: Positive for toxigenic C. difficile with little to no toxin production. Only treat if clinical presentation suggests symptomatic illness. Performed at [facility], [place] [address] [place], [place] [address]      Time coordinating discharge: 72mins  SIGNED:   Kathie Dike, MD  [facility] 05/18/2021, 1:56 PM   If 7PM-7AM, please contact night-coverage [email or web address]

## 2021-05-18 NOTE — TOC Transition Note (Signed)
Transition of Care [facility]) - CM/SW Discharge Note   Patient Details  Name: Ariana Shea MRN: [id number] Date of Birth: Aug 17, 1933  Transition of Care [facility]) CM/SW Contact:  Boneta Lucks, RN Phone Number: 05/18/2021, 10:33 AM   Clinical Narrative:   Patient medically ready to discharge to [facility]. Melissa provided room # 303 and number for report. Son is wanting to drive his mother to [place]. Melissa approved him to transport. Patient was COVID negative on 11/24. That is okay for discharge. RN updated and will call report and update her son with time to discharge.   Final next level of care: Skilled Nursing Facility Barriers to Discharge: Barriers Resolved   Patient Goals and CMS Choice Patient states their goals for this hospitalization and ongoing recovery are:: agreeable to SNF CMS Medicare.gov Compare Post Acute Care list provided to:: Patient Choice offered to / list presented to : Patient  Discharge Placement              Patient chooses bed at:  [facility]) Patient to be transferred to facility by: Son   Patient and family notified of of transfer: 05/18/21  Discharge Plan and [place]   Readmission Risk Interventions Readmission Risk Prevention Plan 05/18/2021  Transportation Screening Complete  PCP or Specialist Appt within 5-7 Days Complete  Home Care Screening Complete  Medication Review (RN CM) Complete  Some recent data might be hidden

## 2021-05-20 ENCOUNTER — Other Ambulatory Visit (HOSPITAL_COMMUNITY): Payer: Self-pay | Admitting: Cardiovascular Disease

## 2021-05-20 DIAGNOSIS — I739 Peripheral vascular disease, unspecified: Secondary | ICD-10-CM

## 2021-05-21 LAB — GLUCOSE, CAPILLARY: Glucose-Capillary: 112 mg/dL — ABNORMAL HIGH (ref 70–99)

## 2022-02-04 ENCOUNTER — Ambulatory Visit (INDEPENDENT_AMBULATORY_CARE_PROVIDER_SITE_OTHER): Payer: Medicare Other

## 2022-02-04 ENCOUNTER — Other Ambulatory Visit: Payer: Self-pay

## 2022-02-04 ENCOUNTER — Encounter: Payer: Self-pay | Admitting: Emergency Medicine

## 2022-02-04 ENCOUNTER — Ambulatory Visit
Admission: EM | Admit: 2022-02-04 | Discharge: 2022-02-04 | Disposition: A | Discharge status: Home / Self Care | Payer: Medicare Other | Attending: Family Medicine | Admitting: Family Medicine

## 2022-02-04 DIAGNOSIS — I10 Essential (primary) hypertension: Secondary | ICD-10-CM

## 2022-02-04 DIAGNOSIS — M25561 Pain in right knee: Secondary | ICD-10-CM

## 2022-02-04 MED ORDER — MELOXICAM 15 MG PO TABS: 15.0000 mg | ORAL_TABLET | Freq: Every day | ORAL | 0 refills | Status: DC

## 2022-02-04 NOTE — ED Triage Notes (Signed)
Pt reports right knee swelling x1-2 weeks. Pt denies any known injury and reports "I think its got fluid in it." Pt denies pain at rest but reports increase in pain with ambulation.  Gait at baseline. No redness noted to right knee.

## 2022-02-04 NOTE — ED Provider Notes (Addendum)
[place]   [id number] 02/04/22 Arrival Time: 4098  ASSESSMENT & PLAN:  1. Acute pain of right knee   2. Elevated blood pressure reading in office with diagnosis of hypertension    I have personally viewed the imaging studies ordered this visit. No acute bony changes.  Trial of: New Prescriptions   MELOXICAM (MOBIC) 15 MG TABLET    Take 1 tablet (15 mg total) by mouth daily.   Recommend:  Follow-up Information     [place] [facility].   Specialty: Orthopedics Why: If worsening or failing to improve as anticipated. Contact information: [address] [place] [address] [phone]                 Discharge Instructions      Your blood pressure was noted to be elevated during your visit today. If you are currently taking medication for high blood pressure, please ensure you are taking this as directed. If you do not have a history of high blood pressure and your blood pressure remains persistently elevated, you may need to begin taking a medication at some point. You may return here within the next few days to recheck if unable to see your primary care provider or if you do not have a one.  BP (!) 168/82 (BP Location: Right Arm)   Pulse 91   Temp 98 F (36.7 C) (Oral)   Resp 20   SpO2 93%   BP Readings from Last 3 Encounters:  02/04/22 (!) 168/82  05/18/21 (!) 156/81  04/01/21 140/86         Reviewed expectations re: course of current medical issues. Questions answered. Outlined signs and symptoms indicating need for more acute intervention. Patient verbalized understanding. After Visit Summary given.  SUBJECTIVE: History from: patient. Ariana Shea is a 88 y.o. female who reports RIGHT knee pain; no trauma; noted 1-2 w ago. No specific aggravating or alleviating factors reported. Has noted swelling this week. No extremity sensation changes or weakness. No tx PTA.  Increased blood  pressure noted today. Reports that she is treated for HTN.  She reports taking medications as instructed, no chest pain on exertion, no dyspnea on exertion, no swelling of ankles, no orthostatic dizziness or lightheadedness, no orthopnea or paroxysmal nocturnal dyspnea, and no palpitations.  Past Surgical History:  Procedure Laterality Date   ABDOMINAL HYSTERECTOMY     ARTERY REPAIR Bilateral    corotid artery surgery   BLADDER SURGERY     tuck   CAROTID ENDARTERECTOMY     bilateral CEA 2008; (L) 11/2006, (R) 01/2007 with reperfusion phenomenon (seizure) post-operaitvely   CHOLECYSTECTOMY     MULTIPLE EXTRACTIONS WITH ALVEOLOPLASTY N/A 12/07/2013   Procedure: MULTIPLE EXTRACTION WITH ALVEOLOPLASTY;  Surgeon: Gae Bon, DDS;  Location: [facility];  Service: Oral Surgery;  Laterality: N/A;      OBJECTIVE:  Vitals:   02/04/22 1510  BP: (!) 168/82  Pulse: 91  Resp: 20  Temp: 98 F (36.7 C)  TempSrc: Oral  SpO2: 93%    General appearance: alert; no distress HEENT: ; AT Neck: supple with FROM Resp: unlabored respirations Extremities: RLE: warm with well perfused appearance; poorly localized moderate tenderness over right medial knee; without gross deformities; swelling: minimal; bruising: none; knee ROM: normal CV: brisk extremity capillary refill of RLE; 2+ DP pulse of RLE. Skin: warm and dry; no visible rashes Neurologic: gait normal; normal sensation and strength of RLE Psychological: alert and cooperative;  normal mood and affect  Imaging: DG Knee Complete 4 Views Right  Result Date: 02/04/2022 CLINICAL DATA:  Pain and swelling of right knee for 2 weeks. EXAM: RIGHT KNEE - COMPLETE 4+ VIEW COMPARISON:  None Available. FINDINGS: No evidence of fracture, dislocation, or joint effusion. No significant joint space narrowing. Chondrocalcinosis of the lateral joint space. Calcified plaque in the distal SFA and popliteal artery. No bony lesions or destruction. Soft tissues are  unremarkable. IMPRESSION: No acute findings. Chondrocalcinosis in the lateral joint space. Atherosclerosis of the right SFA and popliteal artery. Electronically Signed   By: Aletta Edouard M.D.   On: 02/04/2022 15:40      Allergies  Allergen Reactions   Lisinopril     Other reaction(s): Other (See Comments)  Decreased kidney functions   Red Blood Cells Other (See Comments)    Patient is a Jehovah's Witness   Penicillins Rash   Sulfonamide Derivatives Rash    Past Medical History:  Diagnosis Date   Abdominal aortic aneurysm ([place]) 06/30/2014   Recommend follow-up ultrasound in 2018.   Anemia    Arthritis    HANDS   BCC (basal cell carcinoma of skin) 12/16/2015   Right Ant. Shoulder    Blood transfusion without reported diagnosis    Diabetes mellitus    Generalized and unspecified atherosclerosis    GERD (gastroesophageal reflux disease)    Hydronephrosis of right kidney 06/26/2014   Chronic UPJ stenosis   Hypertension    Hypomagnesemia 06/23/2014   And hypocalcemia, hypophosphatemia, and hypokalemia.   Nodular basal cell carcinoma (BCC) 06/27/2017   Behind Left Ear   Peripheral arterial disease (HCC)    history of known right common iliac artery occlusion status post left iliac stenting.   Pneumonia    hx   Pure hypercholesterolemia    Superficial nodular basal cell carcinoma (BCC) 12/16/2015   Left Ant. Neck   Superficial nodular basal cell carcinoma (BCC) 06/27/2017   Behing Right Ear   Swallowing difficulty    gets crumbs caught in throat-told may have to have esophagus streched   Ulcer    UTI (lower urinary tract infection)    recent tx    Social History   Socioeconomic History   Marital status: Single    Spouse name: Not on file   Number of children: Not on file   Years of education: Not on file   Highest education level: Not on file  Occupational History   Not on file  Tobacco Use   Smoking status: Former    Packs/day: 1.00    Years: 56.00    Total pack  years: 56.00    Types: Cigarettes    Quit date: 11/28/2008    Years since quitting: 13.1   Smokeless tobacco: Never  Vaping Use   Vaping Use: Never used  Substance and Sexual Activity   Alcohol use: No    Comment: quit   Drug use: No   Sexual activity: Not on file  Other Topics Concern   Not on file  Social History Narrative   Not on file   Social Determinants of Health   Financial Resource Strain: Not on file  Food Insecurity: Not on file  Transportation Needs: Not on file  Physical Activity: Not on file  Stress: Not on file  Social Connections: Not on file   Family History  Problem Relation Age of Onset   Heart disease Father    Past Surgical History:  Procedure Laterality Date   ABDOMINAL  HYSTERECTOMY     ARTERY REPAIR Bilateral    corotid artery surgery   BLADDER SURGERY     tuck   CAROTID ENDARTERECTOMY     bilateral CEA 2008; (L) 11/2006, (R) 01/2007 with reperfusion phenomenon (seizure) post-operaitvely   CHOLECYSTECTOMY     MULTIPLE EXTRACTIONS WITH ALVEOLOPLASTY N/A 12/07/2013   Procedure: MULTIPLE EXTRACTION WITH ALVEOLOPLASTY;  Surgeon: Gae Bon, DDS;  Location: [facility];  Service: Oral Surgery;  Laterality: N/A;       Vanessa Kick, MD 02/04/22 1551    Vanessa Kick, MD 02/04/22 [phone]

## 2022-02-04 NOTE — Discharge Instructions (Signed)
Your blood pressure was noted to be elevated during your visit today. If you are currently taking medication for high blood pressure, please ensure you are taking this as directed. If you do not have a history of high blood pressure and your blood pressure remains persistently elevated, you may need to begin taking a medication at some point. You may return here within the next few days to recheck if unable to see your primary care provider or if you do not have a one.  BP (!) 168/82 (BP Location: Right Arm)   Pulse 91   Temp 98 F (36.7 C) (Oral)   Resp 20   SpO2 93%   BP Readings from Last 3 Encounters:  02/04/22 (!) 168/82  05/18/21 (!) 156/81  04/01/21 140/86

## 2022-04-30 ENCOUNTER — Telehealth: Payer: Self-pay | Admitting: Cardiovascular Disease

## 2022-04-30 NOTE — Telephone Encounter (Signed)
Patient would like to reschedule vascular tests to after 12/5  Patient's current orders expire 11/30 and will need to be updated before the appointments can be rescheduled.

## 2022-05-18 ENCOUNTER — Ambulatory Visit (HOSPITAL_COMMUNITY): Admission: RE | Admit: 2022-05-18 | Payer: Medicaid Other | Source: Ambulatory Visit

## 2022-05-18 ENCOUNTER — Ambulatory Visit (HOSPITAL_COMMUNITY): Payer: Medicaid Other

## 2022-05-25 ENCOUNTER — Other Ambulatory Visit (HOSPITAL_COMMUNITY): Payer: Self-pay | Admitting: Cardiovascular Disease

## 2022-05-25 DIAGNOSIS — I739 Peripheral vascular disease, unspecified: Secondary | ICD-10-CM

## 2022-05-25 DIAGNOSIS — I7143 Infrarenal abdominal aortic aneurysm, without rupture: Secondary | ICD-10-CM

## 2022-06-02 ENCOUNTER — Ambulatory Visit (HOSPITAL_COMMUNITY): Payer: Medicaid Other

## 2022-06-24 ENCOUNTER — Encounter (HOSPITAL_COMMUNITY): Payer: Medicaid Other

## 2022-07-06 ENCOUNTER — Ambulatory Visit: Payer: 59 | Admitting: Cardiovascular Disease

## 2022-07-29 ENCOUNTER — Ambulatory Visit (HOSPITAL_COMMUNITY)
Admission: RE | Admit: 2022-07-29 | Discharge: 2022-07-29 | Disposition: A | Discharge status: Home / Self Care | Payer: 59 | Source: Ambulatory Visit | Attending: Cardiology | Admitting: Cardiology

## 2022-07-29 ENCOUNTER — Ambulatory Visit (HOSPITAL_BASED_OUTPATIENT_CLINIC_OR_DEPARTMENT_OTHER)
Admission: RE | Admit: 2022-07-29 | Discharge: 2022-07-29 | Disposition: A | Discharge status: Home / Self Care | Payer: 59 | Source: Ambulatory Visit | Attending: Cardiovascular Disease | Admitting: Cardiovascular Disease

## 2022-07-29 DIAGNOSIS — I739 Peripheral vascular disease, unspecified: Secondary | ICD-10-CM

## 2022-07-29 DIAGNOSIS — I7143 Infrarenal abdominal aortic aneurysm, without rupture: Secondary | ICD-10-CM | POA: Diagnosis present

## 2022-07-29 LAB — VAS US ABI WITH/WO TBI
Left ABI: 0.62
Right ABI: 0.41

## 2023-04-01 ENCOUNTER — Telehealth: Payer: Self-pay | Admitting: Cardiovascular Disease

## 2023-04-01 NOTE — Telephone Encounter (Signed)
Called pt to let her know she should contact her insurance company yo see who they will cover. She verbalized understanding, no further questions at this time.

## 2023-04-01 NOTE — Telephone Encounter (Signed)
Patient is calling because per patient Dr. Allyson Sabal was wanting her to schedule an appointment with the vascular doctor. Patient states the vascular doctor in [place] had move, but would like to know if Dr. Allyson Sabal would like her to see someone else. Patient mentioned her insurance no longer covers her to see Dr. Allyson Sabal. Please advise.

## 2023-05-30 ENCOUNTER — Telehealth: Payer: Self-pay | Admitting: Cardiovascular Disease

## 2023-05-30 NOTE — Telephone Encounter (Signed)
Pt would like to switch from Dr. Allyson Sabal to Dr. Diona Browner. Please advise

## 2023-07-22 ENCOUNTER — Ambulatory Visit: Payer: 59 | Attending: Cardiology | Admitting: Cardiology

## 2023-07-22 ENCOUNTER — Encounter: Payer: Self-pay | Admitting: Cardiology

## 2023-07-22 VITALS — BP 98/62 | HR 84 | Ht 63.0 in | Wt 120.0 lb

## 2023-07-22 DIAGNOSIS — I10 Essential (primary) hypertension: Secondary | ICD-10-CM

## 2023-07-22 DIAGNOSIS — I7143 Infrarenal abdominal aortic aneurysm, without rupture: Secondary | ICD-10-CM | POA: Diagnosis not present

## 2023-07-22 DIAGNOSIS — E782 Mixed hyperlipidemia: Secondary | ICD-10-CM | POA: Diagnosis not present

## 2023-07-22 DIAGNOSIS — I739 Peripheral vascular disease, unspecified: Secondary | ICD-10-CM | POA: Diagnosis not present

## 2023-07-22 DIAGNOSIS — I5042 Chronic combined systolic (congestive) and diastolic (congestive) heart failure: Secondary | ICD-10-CM

## 2023-07-22 DIAGNOSIS — I779 Disorder of arteries and arterioles, unspecified: Secondary | ICD-10-CM

## 2023-07-22 NOTE — Progress Notes (Signed)
    Cardiology Office Note  Date: 07/22/2023   ID: Ariana Shea, DOB [birth year], MRN [id number]  History of Present Illness: Ariana Shea is an 89 y.o. female former patient of Dr. Allyson Sabal now presenting to establish follow-up with me.  I reviewed extensive records.  She states that she has had pain in her right leg, mainly in the thigh when she walks, no sores or ulcerations but has noted more prominent spider veins in her right foot.  She had ABIs which were significantly abnormal last year as noted below.  Continues on Pletal along with Pravachol.  She does not report any chest pain, no dizziness or syncope.  I reviewed her medications.  Current regimen includes Norvasc, Coreg, Pletal, and Pravachol.  Lab work from October 2024 revealed LDL 49.  I reviewed her ECG today which shows normal sinus rhythm with low voltage, poor R wave progression.  Physical Exam: VS:  BP 98/62   Pulse 84   Ht 5\' 3"  (1.6 m)   Wt 120 lb (54.4 kg)   SpO2 96%   BMI 21.26 kg/m , BMI Body mass index is 21.26 kg/m.  Wt Readings from Last 3 Encounters:  07/22/23 120 lb (54.4 kg)  05/14/21 134 lb (60.8 kg)  04/01/21 132 lb 9.6 oz (60.1 kg)    General: Patient appears comfortable at rest. HEENT: Conjunctiva and lids normal. Neck: Supple, no elevated JVP or carotid bruits. Lungs: Clear to auscultation, nonlabored breathing at rest. Cardiac: RRR with 2/6 systolic murmur and no gallop. Extremities: Decreased DPs bilaterally, spider veins right foot and ankle, no sores or ulcerations.  ECG:  An ECG dated 05/16/2021 was personally reviewed today and demonstrated:  Sinus rhythm with PACs and lead motion artifact, leftward axis, low voltage.  Labwork:  October 2024: Potassium 4.1, BUN 21, creatinine 1.08, AST 21, ALT 16, cholesterol 124, triglycerides 144, HDL 52, LDL 49, hemoglobin A1c 6.7%, hemoglobin 13.9, platelets 237  Other Studies Reviewed Today:  Cardiac testing for review today.  Assessment and  Plan:  1.  Primary hypertension.  Blood pressure low normal today, patient asymptomatic.  She is on Norvasc and Coreg.  I did not make any changes today.  2.  Mixed hyperlipidemia.  Doing well on Pravachol with LDL 49 as of October 2024.  3.  Bilateral carotid artery disease status post previous bilateral CEA in 2008.  Last carotid Doppler was in October 2022 at which point she had 1 to 39% bilateral ICA stenosis.  And update carotid Dopplers.  Continue statin therapy.  4.  PAD with known occlusion of the right common iliac and status post stent intervention of the right common iliac.  Doppler imaging in February 2024 indicated less than 50% left common iliac stenosis.  Right ABI 0.41 and left ABI 0.62 at that time.  She reports right leg pain suggestive of claudication, no sores or ulcerations.  Update ABIs and arterial Dopplers.  May need to refer her back for vascular evaluation.  He is on Pletal and Pravachol.  5.  Asymptomatic AAA measuring 3.7 cm by ultrasound in February 2024.  Stable in comparison to prior study from 2022.  Disposition:  Follow up  6 months.  Signed, Jonelle Sidle, M.D., F.A.C.C. [facility]

## 2023-07-22 NOTE — Patient Instructions (Signed)
Medication Instructions:  Your physician recommends that you continue on your current medications as directed. Please refer to the Current Medication list given to you today.   Labwork: None  Testing/Procedures: Your physician has requested that you have a carotid duplex. This test is an ultrasound of the carotid arteries in your neck. It looks at blood flow through these arteries that supply the brain with blood. Allow one hour for this exam. There are no restrictions or special instructions.  Your physician has requested that you have an ankle brachial index (ABI). During this test an ultrasound and blood pressure cuff are used to evaluate the arteries that supply the arms and legs with blood. Allow thirty minutes for this exam. There are no restrictions or special instructions.  Please note: We ask at that you not bring children with you during ultrasound (echo/ vascular) testing. Due to room size and safety concerns, children are not allowed in the ultrasound rooms during exams. Our front office staff cannot provide observation of children in our lobby area while testing is being conducted. An adult accompanying a patient to their appointment will only be allowed in the ultrasound room at the discretion of the ultrasound technician under special circumstances. We apologize for any inconvenience.  Your physician has requested that you have a lower or upper extremity arterial duplex. This test is an ultrasound of the arteries in the legs or arms. It looks at arterial blood flow in the legs and arms. Allow one hour for Lower and Upper Arterial scans. There are no restrictions or special instructions.  Please note: We ask at that you not bring children with you during ultrasound (echo/ vascular) testing. Due to room size and safety concerns, children are not allowed in the ultrasound rooms during exams. Our front office staff cannot provide observation of children in our lobby area while testing is  being conducted. An adult accompanying a patient to their appointment will only be allowed in the ultrasound room at the discretion of the ultrasound technician under special circumstances. We apologize for any inconvenience.   Follow-Up: Your physician recommends that you schedule a follow-up appointment in: 6 months  Any Other Special Instructions Will Be Listed Below (If Applicable). Thank you for choosing [facility]!      If you need a refill on your cardiac medications before your next appointment, please call your pharmacy.

## 2023-08-09 ENCOUNTER — Ambulatory Visit: Payer: 59

## 2023-08-20 DEATH — deceased
